# Patient Record
Sex: Female | Born: 1955
Health system: Southern US, Community
[De-identification: ages and names within clinical notes are randomized; demographics above are authoritative.]

## PROBLEM LIST (undated history)

## (undated) DIAGNOSIS — E785 Hyperlipidemia, unspecified: Secondary | ICD-10-CM

## (undated) DIAGNOSIS — F32A Depression, unspecified: Secondary | ICD-10-CM

## (undated) DIAGNOSIS — I10 Essential (primary) hypertension: Secondary | ICD-10-CM

## (undated) DIAGNOSIS — N189 Chronic kidney disease, unspecified: Secondary | ICD-10-CM

## (undated) DIAGNOSIS — F329 Major depressive disorder, single episode, unspecified: Secondary | ICD-10-CM

## (undated) DIAGNOSIS — R634 Abnormal weight loss: Secondary | ICD-10-CM

## (undated) DIAGNOSIS — M961 Postlaminectomy syndrome, not elsewhere classified: Secondary | ICD-10-CM

## (undated) DIAGNOSIS — M810 Age-related osteoporosis without current pathological fracture: Secondary | ICD-10-CM

## (undated) HISTORY — DX: Postlaminectomy syndrome, not elsewhere classified: M96.1

## (undated) HISTORY — PX: SPINE SURGERY: SHX786

## (undated) HISTORY — DX: Depression, unspecified: F32.A

## (undated) HISTORY — DX: Age-related osteoporosis without current pathological fracture: M81.0

## (undated) HISTORY — DX: Essential (primary) hypertension: I10

## (undated) HISTORY — DX: Major depressive disorder, single episode, unspecified: F32.9

## (undated) HISTORY — DX: Hyperlipidemia, unspecified: E78.5

## (undated) HISTORY — PX: ABDOMINAL HYSTERECTOMY: SHX81

## (undated) HISTORY — DX: Abnormal weight loss: R63.4

## (undated) HISTORY — PX: MOUTH SURGERY: SHX715

## (undated) HISTORY — DX: Chronic kidney disease, unspecified: N18.9

---

## 2004-03-17 ENCOUNTER — Ambulatory Visit: Payer: Self-pay | Admitting: Physician Assistant

## 2004-04-19 ENCOUNTER — Ambulatory Visit: Payer: Self-pay | Admitting: Physician Assistant

## 2004-05-18 ENCOUNTER — Ambulatory Visit: Payer: Self-pay | Admitting: Physician Assistant

## 2004-06-15 ENCOUNTER — Ambulatory Visit: Payer: Self-pay | Admitting: Physician Assistant

## 2004-07-16 ENCOUNTER — Ambulatory Visit: Payer: Self-pay | Admitting: Physician Assistant

## 2004-08-16 ENCOUNTER — Ambulatory Visit: Payer: Self-pay | Admitting: Physician Assistant

## 2004-09-14 ENCOUNTER — Ambulatory Visit: Payer: Self-pay | Admitting: Physician Assistant

## 2004-09-28 ENCOUNTER — Ambulatory Visit: Payer: Self-pay | Admitting: Physician Assistant

## 2004-11-08 ENCOUNTER — Ambulatory Visit: Payer: Self-pay | Admitting: Physician Assistant

## 2004-11-18 ENCOUNTER — Ambulatory Visit: Payer: Self-pay | Admitting: Pain Medicine

## 2004-12-01 ENCOUNTER — Ambulatory Visit: Payer: Self-pay | Admitting: Physician Assistant

## 2004-12-02 ENCOUNTER — Ambulatory Visit: Payer: Self-pay | Admitting: Pain Medicine

## 2004-12-09 ENCOUNTER — Ambulatory Visit: Payer: Self-pay | Admitting: Pain Medicine

## 2005-01-12 ENCOUNTER — Ambulatory Visit: Payer: Self-pay | Admitting: Physician Assistant

## 2005-02-14 ENCOUNTER — Ambulatory Visit: Payer: Self-pay | Admitting: Physician Assistant

## 2005-02-24 ENCOUNTER — Ambulatory Visit: Payer: Self-pay | Admitting: Pain Medicine

## 2005-03-14 ENCOUNTER — Ambulatory Visit: Payer: Self-pay | Admitting: Physician Assistant

## 2005-04-11 ENCOUNTER — Ambulatory Visit: Payer: Self-pay | Admitting: Physician Assistant

## 2005-05-12 ENCOUNTER — Ambulatory Visit: Payer: Self-pay | Admitting: Physician Assistant

## 2005-06-07 ENCOUNTER — Ambulatory Visit: Payer: Self-pay | Admitting: Physician Assistant

## 2005-06-27 ENCOUNTER — Ambulatory Visit: Payer: Self-pay | Admitting: Physician Assistant

## 2005-08-11 ENCOUNTER — Ambulatory Visit: Payer: Self-pay | Admitting: Physician Assistant

## 2005-09-06 ENCOUNTER — Ambulatory Visit: Payer: Self-pay | Admitting: Pain Medicine

## 2005-10-06 ENCOUNTER — Ambulatory Visit: Payer: Self-pay | Admitting: Physician Assistant

## 2005-11-10 ENCOUNTER — Ambulatory Visit: Payer: Self-pay | Admitting: Physician Assistant

## 2005-12-08 ENCOUNTER — Ambulatory Visit: Payer: Self-pay | Admitting: Physician Assistant

## 2005-12-13 ENCOUNTER — Ambulatory Visit: Payer: Self-pay | Admitting: Pain Medicine

## 2006-01-05 ENCOUNTER — Ambulatory Visit: Payer: Self-pay | Admitting: Physician Assistant

## 2006-02-06 ENCOUNTER — Ambulatory Visit: Payer: Self-pay | Admitting: Physician Assistant

## 2006-02-09 ENCOUNTER — Ambulatory Visit: Payer: Self-pay | Admitting: Pain Medicine

## 2006-03-07 ENCOUNTER — Ambulatory Visit: Payer: Self-pay | Admitting: Physician Assistant

## 2006-04-06 ENCOUNTER — Ambulatory Visit: Payer: Self-pay | Admitting: Physician Assistant

## 2006-05-08 ENCOUNTER — Ambulatory Visit: Payer: Self-pay | Admitting: Physician Assistant

## 2006-06-07 ENCOUNTER — Ambulatory Visit: Payer: Self-pay | Admitting: Physician Assistant

## 2006-07-06 ENCOUNTER — Ambulatory Visit: Payer: Self-pay | Admitting: Physician Assistant

## 2006-08-01 ENCOUNTER — Ambulatory Visit: Payer: Self-pay | Admitting: Physician Assistant

## 2006-09-13 ENCOUNTER — Ambulatory Visit: Payer: Self-pay | Admitting: Physician Assistant

## 2006-10-13 ENCOUNTER — Ambulatory Visit: Payer: Self-pay | Admitting: Physician Assistant

## 2006-11-13 ENCOUNTER — Ambulatory Visit: Payer: Self-pay | Admitting: Physician Assistant

## 2006-12-13 ENCOUNTER — Ambulatory Visit: Payer: Self-pay | Admitting: Physician Assistant

## 2007-01-11 ENCOUNTER — Ambulatory Visit: Payer: Self-pay | Admitting: Physician Assistant

## 2007-02-12 ENCOUNTER — Ambulatory Visit: Payer: Self-pay | Admitting: Physician Assistant

## 2007-03-14 ENCOUNTER — Ambulatory Visit: Payer: Self-pay | Admitting: Physician Assistant

## 2007-04-12 ENCOUNTER — Ambulatory Visit: Payer: Self-pay | Admitting: Physician Assistant

## 2007-05-10 ENCOUNTER — Ambulatory Visit: Payer: Self-pay | Admitting: Pain Medicine

## 2007-06-11 ENCOUNTER — Ambulatory Visit: Payer: Self-pay | Admitting: Physician Assistant

## 2007-07-11 ENCOUNTER — Ambulatory Visit: Payer: Self-pay | Admitting: Physician Assistant

## 2007-08-13 ENCOUNTER — Ambulatory Visit: Payer: Self-pay | Admitting: Physician Assistant

## 2007-09-12 ENCOUNTER — Ambulatory Visit: Payer: Self-pay | Admitting: Physician Assistant

## 2007-10-16 ENCOUNTER — Ambulatory Visit: Payer: Self-pay | Admitting: Physician Assistant

## 2007-11-14 ENCOUNTER — Ambulatory Visit: Payer: Self-pay | Admitting: Pain Medicine

## 2007-12-13 ENCOUNTER — Ambulatory Visit: Payer: Self-pay | Admitting: Physician Assistant

## 2008-01-01 ENCOUNTER — Ambulatory Visit: Payer: Self-pay | Admitting: Pain Medicine

## 2008-01-15 ENCOUNTER — Ambulatory Visit: Payer: Self-pay | Admitting: Physician Assistant

## 2008-02-11 ENCOUNTER — Ambulatory Visit: Payer: Self-pay | Admitting: Pain Medicine

## 2008-03-13 ENCOUNTER — Ambulatory Visit: Payer: Self-pay | Admitting: Physician Assistant

## 2008-04-09 ENCOUNTER — Ambulatory Visit: Payer: Self-pay | Admitting: Physician Assistant

## 2008-06-10 ENCOUNTER — Ambulatory Visit: Payer: Self-pay | Admitting: Physician Assistant

## 2008-09-10 ENCOUNTER — Ambulatory Visit: Payer: Self-pay | Admitting: Physician Assistant

## 2008-12-08 ENCOUNTER — Ambulatory Visit: Payer: Self-pay | Admitting: Physician Assistant

## 2009-03-12 ENCOUNTER — Ambulatory Visit: Payer: Self-pay | Admitting: Physician Assistant

## 2009-06-10 ENCOUNTER — Ambulatory Visit: Payer: Self-pay | Admitting: Physician Assistant

## 2009-09-07 ENCOUNTER — Ambulatory Visit: Payer: Self-pay | Admitting: Pain Medicine

## 2013-04-10 ENCOUNTER — Ambulatory Visit: Payer: Self-pay

## 2014-11-17 ENCOUNTER — Other Ambulatory Visit: Payer: Self-pay | Admitting: Family Medicine

## 2014-11-17 DIAGNOSIS — M961 Postlaminectomy syndrome, not elsewhere classified: Secondary | ICD-10-CM | POA: Insufficient documentation

## 2014-11-17 MED ORDER — OXYCODONE HCL 20 MG PO TABS
1.0000 | ORAL_TABLET | Freq: Four times a day (QID) | ORAL | Status: DC
Start: 2014-11-17 — End: 2014-12-16

## 2014-11-18 DIAGNOSIS — F329 Major depressive disorder, single episode, unspecified: Secondary | ICD-10-CM

## 2014-11-18 DIAGNOSIS — E785 Hyperlipidemia, unspecified: Secondary | ICD-10-CM | POA: Insufficient documentation

## 2014-11-18 DIAGNOSIS — I10 Essential (primary) hypertension: Secondary | ICD-10-CM | POA: Insufficient documentation

## 2014-11-18 DIAGNOSIS — F339 Major depressive disorder, recurrent, unspecified: Secondary | ICD-10-CM | POA: Insufficient documentation

## 2014-11-19 ENCOUNTER — Ambulatory Visit (INDEPENDENT_AMBULATORY_CARE_PROVIDER_SITE_OTHER): Payer: Medicaid Other | Admitting: Family Medicine

## 2014-11-19 ENCOUNTER — Encounter: Payer: Self-pay | Admitting: Family Medicine

## 2014-11-19 VITALS — BP 100/70 | HR 112 | Temp 98.2°F | Ht 66.5 in | Wt 143.0 lb

## 2014-11-19 DIAGNOSIS — M961 Postlaminectomy syndrome, not elsewhere classified: Secondary | ICD-10-CM | POA: Diagnosis not present

## 2014-11-19 DIAGNOSIS — F329 Major depressive disorder, single episode, unspecified: Secondary | ICD-10-CM | POA: Diagnosis not present

## 2014-11-19 DIAGNOSIS — I10 Essential (primary) hypertension: Secondary | ICD-10-CM | POA: Diagnosis not present

## 2014-11-19 DIAGNOSIS — F32A Depression, unspecified: Secondary | ICD-10-CM

## 2014-11-19 DIAGNOSIS — K219 Gastro-esophageal reflux disease without esophagitis: Secondary | ICD-10-CM | POA: Diagnosis not present

## 2014-11-19 DIAGNOSIS — E785 Hyperlipidemia, unspecified: Secondary | ICD-10-CM | POA: Diagnosis not present

## 2014-11-19 MED ORDER — CYCLOBENZAPRINE HCL 10 MG PO TABS: 10.0000 mg | ORAL_TABLET | Freq: Three times a day (TID) | ORAL | Status: DC | PRN

## 2014-11-19 MED ORDER — ALPRAZOLAM 2 MG PO TABS: 2.0000 mg | ORAL_TABLET | Freq: Four times a day (QID) | ORAL | Status: DC

## 2014-11-19 NOTE — Addendum Note (Signed)
Addended byGolden Pop on: 11/19/2014 02:28 PM   Modules accepted: Orders

## 2014-11-19 NOTE — Assessment & Plan Note (Signed)
The current medical regimen is effective;  continue present plan and medications.  

## 2014-11-19 NOTE — Assessment & Plan Note (Addendum)
The current medical regimen is effective;  continue present plan and medications. Taking xanax 2mg  4 times a day has done so long term and stable. Will cont xanax dosing.

## 2014-11-19 NOTE — Assessment & Plan Note (Signed)
pts failed back is stable Pain control stable discussed referral to Dr Primus Bravo for rx of narcotics due to current medical climate.

## 2014-11-19 NOTE — Assessment & Plan Note (Signed)
Discuss use of meds prop up bed and use of prilosec

## 2014-11-19 NOTE — Progress Notes (Signed)
BP 100/70 mmHg  Pulse 112  Temp(Src) 98.2 F (36.8 C)  Ht 5' 6.5" (1.689 m)  Wt 143 lb (64.864 kg)  BMI 22.74 kg/m2  SpO2 99%   Subjective:    Patient ID: Caitlyn May, female    DOB: 02-05-1956, 59 y.o.   MRN: [id number]  HPI: Caitlyn May is a 59 y.o. female  Chief Complaint  Patient presents with  . Medication Refill  pts failed back is stable Pain control stable discussed referral to Dr Primus Bravo for rx of narcotics due to current medical climate.  BP and lipids stable on meds  Takes every day long term  Reflux is worse zantac ot doing the job  Relevant past medical, surgical, family and social history reviewed and updated as indicated. Interim medical history since our last visit reviewed. Allergies and medications reviewed and updated.  Review of Systems  Constitutional: Negative.   Respiratory: Negative.   Cardiovascular: Negative.     Per HPI unless specifically indicated above     Objective:    BP 100/70 mmHg  Pulse 112  Temp(Src) 98.2 F (36.8 C)  Ht 5' 6.5" (1.689 m)  Wt 143 lb (64.864 kg)  BMI 22.74 kg/m2  SpO2 99%  Wt Readings from Last 3 Encounters:  11/19/14 143 lb (64.864 kg)  08/27/14 145 lb (65.772 kg)    Physical Exam  Constitutional: She is oriented to person, place, and time. She appears well-developed and well-nourished. No distress.  HENT:  Head: Normocephalic and atraumatic.  Right Ear: Hearing normal.  Left Ear: Hearing normal.  Nose: Nose normal.  Eyes: Conjunctivae and lids are normal. Right eye exhibits no discharge. Left eye exhibits no discharge. No scleral icterus.  Cardiovascular: Normal rate, regular rhythm and normal heart sounds.   Pulmonary/Chest: Effort normal and breath sounds normal. No respiratory distress.  Musculoskeletal: Normal range of motion.  Neurological: She is alert and oriented to person, place, and time.  Skin: Skin is intact. No rash noted.  Psychiatric: She has a normal mood and  affect. Her speech is normal and behavior is normal. Judgment and thought content normal. Cognition and memory are normal.    No results found for this or any previous visit.    Assessment & Plan:   Problem List Items Addressed This Visit      Cardiovascular and Mediastinum   Hypertension - Primary    The current medical regimen is effective;  continue present plan and medications.         Digestive   Acid reflux    Discuss use of meds prop up bed and use of prilosec      Relevant Medications   ranitidine (ZANTAC) 75 MG tablet     Other   Postlaminectomy syndrome, lumbar region    pts failed back is stable Pain control stable discussed referral to Dr Primus Bravo for rx of narcotics due to current medical climate.      Relevant Medications   cyclobenzaprine (FLEXERIL) 10 MG tablet   Depression    The current medical regimen is effective;  continue present plan and medications. Taking xanax 2mg  4 times a day has done so long term and stable. Will cont xanax dosing.       Relevant Medications   alprazolam (XANAX) 2 MG tablet   Hyperlipidemia    The current medical regimen is effective;  continue present plan and medications.           Follow up plan: Return in  about 3 months (around 02/19/2015) for Physical Exam.

## 2014-11-24 ENCOUNTER — Ambulatory Visit (INDEPENDENT_AMBULATORY_CARE_PROVIDER_SITE_OTHER): Payer: Medicaid Other | Admitting: Family Medicine

## 2014-11-24 ENCOUNTER — Encounter: Payer: Self-pay | Admitting: Family Medicine

## 2014-11-24 VITALS — BP 142/102 | HR 104 | Temp 98.2°F | Ht 65.5 in | Wt 139.2 lb

## 2014-11-24 DIAGNOSIS — R1011 Right upper quadrant pain: Secondary | ICD-10-CM | POA: Insufficient documentation

## 2014-11-24 DIAGNOSIS — K625 Hemorrhage of anus and rectum: Secondary | ICD-10-CM | POA: Diagnosis not present

## 2014-11-24 DIAGNOSIS — I10 Essential (primary) hypertension: Secondary | ICD-10-CM

## 2014-11-24 LAB — CBC WITH DIFFERENTIAL/PLATELET
HEMOGLOBIN: 14.5 g/dL (ref 11.1–15.9)
Hematocrit: 42.4 % (ref 34.0–46.6)
Lymphocytes Absolute: 2.3 10*3/uL (ref 0.7–3.1)
Lymphs: 30 %
MCH: 31.1 pg (ref 26.6–33.0)
MCHC: 34.2 g/dL (ref 31.5–35.7)
MCV: 91 fL (ref 79–97)
MID (Absolute): 0.8 10*3/uL (ref 0.1–1.6)
MID: 11 %
NEUTROS ABS: 4.4 10*3/uL (ref 1.4–7.0)
Neutrophils: 59 %
Platelets: 231 10*3/uL (ref 150–379)
RBC: 4.66 x10E6/uL (ref 3.77–5.28)
RDW: 13 % (ref 12.3–15.4)
WBC: 7.5 10*3/uL (ref 3.4–10.8)

## 2014-11-24 LAB — IFOBT (OCCULT BLOOD): IMMUNOLOGICAL FECAL OCCULT BLOOD TEST: POSITIVE

## 2014-11-24 NOTE — Progress Notes (Signed)
BP 142/102 mmHg  Pulse 104  Temp(Src) 98.2 F (36.8 C) (Oral)  Ht 5' 5.5" (1.664 m)  Wt 139 lb 3.2 oz (63.141 kg)  BMI 22.80 kg/m2  SpO2 96%   Subjective:    Patient ID: Caitlyn May, female    DOB: [birth year], 59 y.o.   MRN: [id number]  HPI: Caitlyn May is a 59 y.o. female  Chief Complaint  Patient presents with  . Rectal Bleeding    Over the weekend   Patient notes that 10-15 years ago she got so constipated that she had to go to the hospital.   ABDOMINAL PAIN- in the middle of the night had severe pain for 50 minutes. Had severe pain in her epigastric region. Was so severe that she started writing her will. She not feels like her pain is in her RUQ. She was bleeding out of her rectum for several hours on Saturday. She would have bleeding with passing gas. She was having severe clots.  Duration: 3:days Onset: sudden Severity: severe  Quality: sharp, cramping and tearing Location:  RUQ and epigastric  Episode duration:  Radiation: no Frequency: constant for about 50 minutes Alleviating factors:  Aggravating factors: Status: better Treatments attempted: none Fever: yes Nausea: no Vomiting: no Weight loss: no Decreased appetite: no Diarrhea: no Constipation: yes Blood in stool: yes Heartburn: no Jaundice: no Rash: no Dysuria/urinary frequency: no Hematuria: no History of sexually transmitted disease: no Recurrent NSAID use: no   Didn't take her BP medicine today. She does not want to have a CT done today. She states that she is too tired to go.   Relevant past medical, surgical, family and social history reviewed and updated as indicated. Interim medical history since our last visit reviewed. Allergies and medications reviewed and updated.  Review of Systems  Constitutional: Positive for chills, diaphoresis and fatigue. Negative for fever, activity change, appetite change and unexpected weight change.  Respiratory: Negative.    Cardiovascular: Negative.   Gastrointestinal: Positive for abdominal pain, constipation, blood in stool, anal bleeding and rectal pain. Negative for nausea, vomiting, diarrhea and abdominal distention.  Genitourinary: Negative.   Psychiatric/Behavioral: Negative.     Per HPI unless specifically indicated above     Objective:    BP 142/102 mmHg  Pulse 104  Temp(Src) 98.2 F (36.8 C) (Oral)  Ht 5' 5.5" (1.664 m)  Wt 139 lb 3.2 oz (63.141 kg)  BMI 22.80 kg/m2  SpO2 96%  Wt Readings from Last 3 Encounters:  11/24/14 139 lb 3.2 oz (63.141 kg)  11/19/14 143 lb (64.864 kg)  08/27/14 145 lb (65.772 kg)    Physical Exam  Constitutional: She is oriented to person, place, and time. She appears well-developed and well-nourished. No distress.  HENT:  Head: Normocephalic and atraumatic.  Right Ear: Hearing normal.  Left Ear: Hearing normal.  Nose: Nose normal.  Eyes: Conjunctivae and lids are normal. Right eye exhibits no discharge. Left eye exhibits no discharge. No scleral icterus.  Cardiovascular: Normal rate, regular rhythm and normal heart sounds.  Exam reveals no gallop and no friction rub.   No murmur heard. Pulmonary/Chest: Effort normal and breath sounds normal. No respiratory distress. She has no wheezes. She has no rales. She exhibits no tenderness.  Abdominal: Soft. Bowel sounds are normal. She exhibits no distension and no mass. There is no hepatosplenomegaly, splenomegaly or hepatomegaly. There is no tenderness. There is no rigidity, no rebound, no guarding, no CVA tenderness, no tenderness at McBurney's point and  negative Murphy's sign.  Genitourinary:  + small non-bleeding external hemorrhoid, palpable internal hemorrhoid at 6:00, slight erythema, no visible blood, + FOBT  Musculoskeletal: Normal range of motion.  Neurological: She is alert and oriented to person, place, and time.  Skin: Skin is warm, dry and intact. No rash noted. No erythema. No pallor.  Psychiatric:  She has a normal mood and affect. Her speech is normal and behavior is normal. Judgment and thought content normal. Cognition and memory are normal.    Results for orders placed or performed in visit on 11/24/14  CBC With Differential/Platelet  Result Value Ref Range   WBC 7.5 3.4 - 10.8 x10E3/uL   RBC 4.66 3.77 - 5.28 x10E6/uL   Hemoglobin 14.5 11.1 - 15.9 g/dL   Hematocrit 42.4 34.0 - 46.6 %   MCV 91 79 - 97 fL   MCH 31.1 26.6 - 33.0 pg   MCHC 34.2 31.5 - 35.7 g/dL   RDW 13.0 12.3 - 15.4 %   Platelets 231 150 - 379 x10E3/uL   NEUTROPHILS 59 %   Lymphs 30 %   MID 11 %   Neutrophils Absolute 4.4 1.4 - 7.0 x10E3/uL   Lymphocytes Absolute 2.3 0.7 - 3.1 x10E3/uL   MID (Absolute) 0.8 0.1 - 1.6 X10E3/uL  IFOBT POC (occult bld, rslt in office)  Result Value Ref Range   IFOBT Positive       Assessment & Plan:   Problem List Items Addressed This Visit      Cardiovascular and Mediastinum   Hypertension    Did not take her medication today. Will restart medication and recheck next visit.         Digestive   Rectal bleeding - Primary    + FOBT today. Benign abdominal exam today. Hgb normal today. Will check CMP as well as amylase and lipase. Will check CT abdomen and pelvis- ordered today. No fever, so will not presuptively treat for diverticulitis. Await CT. Call if not getting better or getting worse. If nothing on CT, will arrange for GI referral for colonoscopy.       Relevant Orders   CT Abdomen Pelvis W Contrast   CBC With Differential/Platelet (Completed)   Comprehensive metabolic panel   IFOBT POC (occult bld, rslt in office) (Completed)     Other   Right upper quadrant pain    Benign exam today. Will check labs and obtain CT of abdomen and pelvis. Bland diet at this time. Monitor closely.       Relevant Orders   CT Abdomen Pelvis W Contrast   CBC With Differential/Platelet (Completed)   Comprehensive metabolic panel   Amylase   Lipase       Follow up  plan: Return in about 1 week (around 12/01/2014).

## 2014-11-24 NOTE — Assessment & Plan Note (Signed)
Benign exam today. Will check labs and obtain CT of abdomen and pelvis. Bland diet at this time. Monitor closely.

## 2014-11-24 NOTE — Assessment & Plan Note (Signed)
+   FOBT today. Benign abdominal exam today. Hgb normal today. Will check CMP as well as amylase and lipase. Will check CT abdomen and pelvis- ordered today. No fever, so will not presuptively treat for diverticulitis. Await CT. Call if not getting better or getting worse. If nothing on CT, will arrange for GI referral for colonoscopy.

## 2014-11-24 NOTE — Patient Instructions (Addendum)
CT scan at [facility] 8:45AM 11/28/14- Friday.  Pick up the prep at the [place] [facility] Thursday.  Nothing to eat or drink after midnight on Thursday except the prep.   Food Choices to Help Relieve Diarrhea When you have diarrhea, the foods you eat and your eating habits are very important. Choosing the right foods and drinks can help relieve diarrhea. Also, because diarrhea can last up to 7 days, you need to replace lost fluids and electrolytes (such as sodium, potassium, and chloride) in order to help prevent dehydration.  WHAT GENERAL GUIDELINES DO I NEED TO FOLLOW?  Slowly drink 1 cup (8 oz) of fluid for each episode of diarrhea. If you are getting enough fluid, your urine will be clear or pale yellow.  Eat starchy foods. Some good choices include white rice, white toast, pasta, low-fiber cereal, baked potatoes (without the skin), saltine crackers, and bagels.  Avoid large servings of any cooked vegetables.  Limit fruit to two servings per day. A serving is  cup or 1 small piece.  Choose foods with less than 2 g of fiber per serving.  Limit fats to less than 8 tsp (38 g) per day.  Avoid fried foods.  Eat foods that have probiotics in them. Probiotics can be found in certain dairy products.  Avoid foods and beverages that may increase the speed at which food moves through the stomach and intestines (gastrointestinal tract). Things to avoid include:  High-fiber foods, such as dried fruit, raw fruits and vegetables, nuts, seeds, and whole grain foods.  Spicy foods and high-fat foods.  Foods and beverages sweetened with high-fructose corn syrup, honey, or sugar alcohols such as xylitol, sorbitol, and mannitol. WHAT FOODS ARE RECOMMENDED? Grains White rice. White, [place], or pita breads (fresh or toasted), including plain rolls, buns, or bagels. White pasta. Saltine, soda, or graham crackers. Pretzels. Low-fiber cereal. Cooked cereals made with water (such as cornmeal,  farina, or cream cereals). Plain muffins. Matzo. Melba toast. Zwieback.  Vegetables Potatoes (without the skin). Strained tomato and vegetable juices. Most well-cooked and canned vegetables without seeds. Tender lettuce. Fruits Cooked or canned applesauce, apricots, cherries, fruit cocktail, grapefruit, peaches, pears, or plums. Fresh bananas, apples without skin, cherries, grapes, cantaloupe, grapefruit, peaches, oranges, or plums.  Meat and Other Protein Products Baked or boiled chicken. Eggs. Tofu. Fish. Seafood. Smooth peanut butter. Ground or well-cooked tender beef, ham, veal, lamb, pork, or poultry.  Dairy Plain yogurt, kefir, and unsweetened liquid yogurt. Lactose-free milk, buttermilk, or soy milk. Plain hard cheese. Beverages Sport drinks. Clear broths. Diluted fruit juices (except prune). Regular, caffeine-free sodas such as ginger ale. Water. Decaffeinated teas. Oral rehydration solutions. Sugar-free beverages not sweetened with sugar alcohols. Other Bouillon, broth, or soups made from recommended foods.  The items listed above may not be a complete list of recommended foods or beverages. Contact your dietitian for more options. WHAT FOODS ARE NOT RECOMMENDED? Grains Whole grain, whole wheat, bran, or rye breads, rolls, pastas, crackers, and cereals. Wild or brown rice. Cereals that contain more than 2 g of fiber per serving. Corn tortillas or taco shells. Cooked or dry oatmeal. Granola. Popcorn. Vegetables Raw vegetables. Cabbage, broccoli, Brussels sprouts, artichokes, baked beans, beet greens, corn, kale, legumes, peas, sweet potatoes, and yams. Potato skins. Cooked spinach and cabbage. Fruits Dried fruit, including raisins and dates. Raw fruits. Stewed or dried prunes. Fresh apples with skin, apricots, mangoes, pears, raspberries, and strawberries.  Meat and Other Protein Products Chunky peanut butter. Nuts and  seeds. Beans and lentils. Berniece Salines.  Dairy High-fat cheeses. Milk,  chocolate milk, and beverages made with milk, such as milk shakes. Cream. Ice cream. Sweets and Desserts Sweet rolls, doughnuts, and sweet breads. Pancakes and waffles. Fats and Oils Butter. Cream sauces. Margarine. Salad oils. Plain salad dressings. Olives. Avocados.  Beverages Caffeinated beverages (such as coffee, tea, soda, or energy drinks). Alcoholic beverages. Fruit juices with pulp. Prune juice. Soft drinks sweetened with high-fructose corn syrup or sugar alcohols. Other Coconut. Hot sauce. Chili powder. Mayonnaise. Gravy. Cream-based or milk-based soups.  The items listed above may not be a complete list of foods and beverages to avoid. Contact your dietitian for more information. WHAT SHOULD I DO IF I BECOME DEHYDRATED? Diarrhea can sometimes lead to dehydration. Signs of dehydration include dark urine and dry mouth and skin. If you think you are dehydrated, you should rehydrate with an oral rehydration solution. These solutions can be purchased at pharmacies, retail stores, or online.  Drink -1 cup (120-240 mL) of oral rehydration solution each time you have an episode of diarrhea. If drinking this amount makes your diarrhea worse, try drinking smaller amounts more often. For example, drink 1-3 tsp (5-15 mL) every 5-10 minutes.  A general rule for staying hydrated is to drink 1-2 L of fluid per day. Talk to your health care provider about the specific amount you should be drinking each day. Drink enough fluids to keep your urine clear or pale yellow. Document Released: 08/06/2003 Document Revised: 05/21/2013 Document Reviewed: 04/08/2013 [facility] Patient Information 2015 [place], [place]. This information is not intended to replace advice given to you by your health care provider. Make sure you discuss any questions you have with your health care provider.

## 2014-11-24 NOTE — Assessment & Plan Note (Signed)
Did not take her medication today. Will restart medication and recheck next visit.

## 2014-11-25 ENCOUNTER — Telehealth: Payer: Self-pay | Admitting: Family Medicine

## 2014-11-25 LAB — COMPREHENSIVE METABOLIC PANEL
ALT: 8 IU/L (ref 0–32)
AST: 12 IU/L (ref 0–40)
Albumin/Globulin Ratio: 1.4 (ref 1.1–2.5)
Albumin: 4.4 g/dL (ref 3.6–4.8)
Alkaline Phosphatase: 119 IU/L — ABNORMAL HIGH (ref 39–117)
BUN/Creatinine Ratio: 14 (ref 11–26)
BUN: 13 mg/dL (ref 8–27)
Bilirubin Total: 0.2 mg/dL (ref 0.0–1.2)
CALCIUM: 9.2 mg/dL (ref 8.7–10.3)
CO2: 23 mmol/L (ref 18–29)
Chloride: 99 mmol/L (ref 97–108)
Creatinine, Ser: 0.94 mg/dL (ref 0.57–1.00)
GFR calc non Af Amer: 66 mL/min/{1.73_m2} (ref >59)
GFR, EST AFRICAN AMERICAN: 76 mL/min/{1.73_m2} (ref >59)
Globulin, Total: 3.1 g/dL (ref 1.5–4.5)
Glucose: 106 mg/dL — ABNORMAL HIGH (ref 65–99)
POTASSIUM: 4.3 mmol/L (ref 3.5–5.2)
Sodium: 139 mmol/L (ref 134–144)
TOTAL PROTEIN: 7.5 g/dL (ref 6.0–8.5)

## 2014-11-25 LAB — LIPASE: Lipase: 34 U/L (ref 0–59)

## 2014-11-25 LAB — AMYLASE: Amylase: 86 U/L (ref 31–124)

## 2014-11-25 NOTE — Telephone Encounter (Signed)
Called patient to let her know that her labs came back normal. Await CT scan.

## 2014-11-28 ENCOUNTER — Telehealth: Payer: Self-pay | Admitting: Family Medicine

## 2014-11-28 ENCOUNTER — Ambulatory Visit
Admission: RE | Admit: 2014-11-28 | Discharge: 2014-11-28 | Disposition: A | Discharge status: Home / Self Care | Payer: Medicare Other | Source: Ambulatory Visit | Attending: Family Medicine | Admitting: Family Medicine

## 2014-11-28 DIAGNOSIS — I700 Atherosclerosis of aorta: Secondary | ICD-10-CM | POA: Insufficient documentation

## 2014-11-28 DIAGNOSIS — K7689 Other specified diseases of liver: Secondary | ICD-10-CM | POA: Insufficient documentation

## 2014-11-28 DIAGNOSIS — K5641 Fecal impaction: Secondary | ICD-10-CM | POA: Insufficient documentation

## 2014-11-28 DIAGNOSIS — Z880 Allergy status to penicillin: Secondary | ICD-10-CM | POA: Diagnosis not present

## 2014-11-28 DIAGNOSIS — I10 Essential (primary) hypertension: Secondary | ICD-10-CM | POA: Insufficient documentation

## 2014-11-28 DIAGNOSIS — F329 Major depressive disorder, single episode, unspecified: Secondary | ICD-10-CM | POA: Diagnosis not present

## 2014-11-28 DIAGNOSIS — N281 Cyst of kidney, acquired: Secondary | ICD-10-CM | POA: Insufficient documentation

## 2014-11-28 DIAGNOSIS — K219 Gastro-esophageal reflux disease without esophagitis: Secondary | ICD-10-CM | POA: Insufficient documentation

## 2014-11-28 DIAGNOSIS — N189 Chronic kidney disease, unspecified: Secondary | ICD-10-CM | POA: Diagnosis not present

## 2014-11-28 DIAGNOSIS — K625 Hemorrhage of anus and rectum: Secondary | ICD-10-CM | POA: Diagnosis not present

## 2014-11-28 DIAGNOSIS — Z888 Allergy status to other drugs, medicaments and biological substances status: Secondary | ICD-10-CM | POA: Diagnosis not present

## 2014-11-28 DIAGNOSIS — K760 Fatty (change of) liver, not elsewhere classified: Secondary | ICD-10-CM | POA: Insufficient documentation

## 2014-11-28 DIAGNOSIS — R1011 Right upper quadrant pain: Secondary | ICD-10-CM

## 2014-11-28 DIAGNOSIS — Z882 Allergy status to sulfonamides status: Secondary | ICD-10-CM | POA: Diagnosis not present

## 2014-11-28 DIAGNOSIS — E785 Hyperlipidemia, unspecified: Secondary | ICD-10-CM | POA: Diagnosis not present

## 2014-11-28 DIAGNOSIS — K5900 Constipation, unspecified: Secondary | ICD-10-CM | POA: Diagnosis not present

## 2014-11-28 MED ORDER — IOHEXOL 300 MG/ML  SOLN
100.0000 mL | Freq: Once | INTRAMUSCULAR | Status: AC | PRN
  Administered 2014-11-28: 100 mL via INTRAVENOUS

## 2014-11-28 MED ORDER — POLYETHYLENE GLYCOL 3350 17 GM/SCOOP PO POWD: 17.0000 g | Freq: Two times a day (BID) | ORAL | Status: DC | PRN

## 2014-11-28 NOTE — Telephone Encounter (Signed)
Called patient with the results of her CT scan. Abundent stool in the Ascending and proximal colon and cecum. Will start her on miralax BID and follow up with her as scheduled. No sign of any infection. Likely due to impaction.

## 2014-12-16 ENCOUNTER — Other Ambulatory Visit: Payer: Self-pay | Admitting: Family Medicine

## 2014-12-16 MED ORDER — OXYCODONE HCL 20 MG PO TABS: 1.0000 | ORAL_TABLET | Freq: Four times a day (QID) | ORAL | Status: DC

## 2015-01-12 ENCOUNTER — Other Ambulatory Visit: Payer: Self-pay | Admitting: Family Medicine

## 2015-01-12 MED ORDER — OXYCODONE HCL 20 MG PO TABS: 1.0000 | ORAL_TABLET | Freq: Four times a day (QID) | ORAL | Status: DC

## 2015-01-26 ENCOUNTER — Ambulatory Visit (INDEPENDENT_AMBULATORY_CARE_PROVIDER_SITE_OTHER): Payer: Medicaid Other | Admitting: Family Medicine

## 2015-01-26 ENCOUNTER — Encounter: Payer: Self-pay | Admitting: Family Medicine

## 2015-01-26 VITALS — BP 111/74 | HR 74 | Temp 97.8°F | Ht 66.0 in | Wt 134.0 lb

## 2015-01-26 DIAGNOSIS — K5641 Fecal impaction: Secondary | ICD-10-CM | POA: Diagnosis not present

## 2015-01-26 DIAGNOSIS — F32A Depression, unspecified: Secondary | ICD-10-CM

## 2015-01-26 DIAGNOSIS — F329 Major depressive disorder, single episode, unspecified: Secondary | ICD-10-CM

## 2015-01-26 DIAGNOSIS — M961 Postlaminectomy syndrome, not elsewhere classified: Secondary | ICD-10-CM | POA: Diagnosis not present

## 2015-01-26 DIAGNOSIS — I10 Essential (primary) hypertension: Secondary | ICD-10-CM

## 2015-01-26 MED ORDER — LUBIPROSTONE 24 MCG PO CAPS: 24.0000 ug | ORAL_CAPSULE | Freq: Two times a day (BID) | ORAL | Status: DC

## 2015-01-26 NOTE — Assessment & Plan Note (Signed)
As with continued chronic pain stable on current pain regimen Needs referral to pain clinic to manage patient's narcotics as we will not be managing narcotics due to current climate for use of pain medications.

## 2015-01-26 NOTE — Assessment & Plan Note (Signed)
The current medical regimen is effective;  continue present plan and medications.  

## 2015-01-26 NOTE — Assessment & Plan Note (Signed)
Depression so much better with resolution of the estate taxes. Patient currently depression stable we'll not change medications and not recommending seen psychiatrist at this point.

## 2015-01-26 NOTE — Progress Notes (Signed)
BP 111/74 mmHg  Pulse 74  Temp(Src) 97.8 F (36.6 C)  Ht 5\' 6"  (1.676 m)  Wt 134 lb (60.782 kg)  BMI 21.64 kg/m2  SpO2 97%   Subjective:    Patient ID: Caitlyn May, female    DOB: 1955/09/06, 59 y.o.   MRN: [id number]  HPI: Caitlyn May is a 59 y.o. female  Chief Complaint  Patient presents with  . Constipation   patient for chronic constipation and severe abdominal pain better when she takes MiraLAX. But doesn't want to continue taking MiraLAX, wondering if there something better or easier to do.  Pain was resolved when not constipated.  constipation probably result of pain medication.  Chronic pain stable did not get pain clinic appointment due to wrong telephone number on file patient will correct.  Chronic pain stable on current medication regimen.  Relevant past medical, surgical, family and social history reviewed and updated as indicated. Interim medical history since our last visit reviewed. Allergies and medications reviewed and updated.  Review of Systems  Constitutional: Negative.   Respiratory: Negative.   Cardiovascular: Negative.     Per HPI unless specifically indicated above     Objective:    BP 111/74 mmHg  Pulse 74  Temp(Src) 97.8 F (36.6 C)  Ht 5\' 6"  (1.676 m)  Wt 134 lb (60.782 kg)  BMI 21.64 kg/m2  SpO2 97%  Wt Readings from Last 3 Encounters:  01/26/15 134 lb (60.782 kg)  11/24/14 139 lb 3.2 oz (63.141 kg)  11/19/14 143 lb (64.864 kg)    Physical Exam  Constitutional: She is oriented to person, place, and time. She appears well-developed and well-nourished. No distress.  HENT:  Head: Normocephalic and atraumatic.  Right Ear: Hearing normal.  Left Ear: Hearing normal.  Nose: Nose normal.  Eyes: Conjunctivae and lids are normal. Right eye exhibits no discharge. Left eye exhibits no discharge. No scleral icterus.  Cardiovascular: Normal rate, regular rhythm and normal heart sounds.   Pulmonary/Chest: Effort normal  and breath sounds normal. No respiratory distress.  Musculoskeletal: Normal range of motion.  Neurological: She is alert and oriented to person, place, and time.  Skin: Skin is intact. No rash noted.  Psychiatric: She has a normal mood and affect. Her speech is normal and behavior is normal. Judgment and thought content normal. Cognition and memory are normal.    Results for orders placed or performed in visit on 11/24/14  CBC With Differential/Platelet  Result Value Ref Range   WBC 7.5 3.4 - 10.8 x10E3/uL   RBC 4.66 3.77 - 5.28 x10E6/uL   Hemoglobin 14.5 11.1 - 15.9 g/dL   Hematocrit 42.4 34.0 - 46.6 %   MCV 91 79 - 97 fL   MCH 31.1 26.6 - 33.0 pg   MCHC 34.2 31.5 - 35.7 g/dL   RDW 13.0 12.3 - 15.4 %   Platelets 231 150 - 379 x10E3/uL   Neutrophils 59 %   Lymphs 30 %   MID 11 %   Neutrophils Absolute 4.4 1.4 - 7.0 x10E3/uL   Lymphocytes Absolute 2.3 0.7 - 3.1 x10E3/uL   MID (Absolute) 0.8 0.1 - 1.6 X10E3/uL  Comprehensive metabolic panel  Result Value Ref Range   Glucose 106 (H) 65 - 99 mg/dL   BUN 13 8 - 27 mg/dL   Creatinine, Ser 0.94 0.57 - 1.00 mg/dL   GFR calc non Af Amer 66 >59 mL/min/1.73   GFR calc Af Amer 76 >59 mL/min/1.73   BUN/Creatinine Ratio  14 11 - 26   Sodium 139 134 - 144 mmol/L   Potassium 4.3 3.5 - 5.2 mmol/L   Chloride 99 97 - 108 mmol/L   CO2 23 18 - 29 mmol/L   Calcium 9.2 8.7 - 10.3 mg/dL   Total Protein 7.5 6.0 - 8.5 g/dL   Albumin 4.4 3.6 - 4.8 g/dL   Globulin, Total 3.1 1.5 - 4.5 g/dL   Albumin/Globulin Ratio 1.4 1.1 - 2.5   Bilirubin Total 0.2 0.0 - 1.2 mg/dL   Alkaline Phosphatase 119 (H) 39 - 117 IU/L   AST 12 0 - 40 IU/L   ALT 8 0 - 32 IU/L  Amylase  Result Value Ref Range   Amylase 86 31 - 124 U/L  Lipase  Result Value Ref Range   Lipase 34 0 - 59 U/L  IFOBT POC (occult bld, rslt in office)  Result Value Ref Range   IFOBT Positive       Assessment & Plan:   Problem List Items Addressed This Visit      Cardiovascular and  Mediastinum   Hypertension - Primary    The current medical regimen is effective;  continue present plan and medications.         Digestive   Fecal impaction of colon    Discussed chronic constipation care and treatment will start medication may need to adjust for insurance companies      Relevant Medications   lubiprostone (AMITIZA) 24 MCG capsule     Other   Postlaminectomy syndrome, lumbar region    As with continued chronic pain stable on current pain regimen Needs referral to pain clinic to manage patient's narcotics as we will not be managing narcotics due to current climate for use of pain medications.      Relevant Orders   Ambulatory referral to Pain Clinic   Depression    Depression so much better with resolution of the estate taxes. Patient currently depression stable we'll not change medications and not recommending seen psychiatrist at this point.          Follow up plan: Return in about 3 months (around 04/28/2015) for Physical Exam.

## 2015-01-26 NOTE — Assessment & Plan Note (Signed)
Discussed chronic constipation care and treatment will start medication may need to adjust for insurance companies

## 2015-01-29 ENCOUNTER — Other Ambulatory Visit: Payer: Self-pay | Admitting: Family Medicine

## 2015-01-29 MED ORDER — OXYCODONE HCL 20 MG PO TABS: 1.0000 | ORAL_TABLET | Freq: Four times a day (QID) | ORAL | Status: DC

## 2015-02-18 ENCOUNTER — Telehealth: Payer: Self-pay

## 2015-02-18 MED ORDER — MOMETASONE FUROATE 50 MCG/ACT NA SUSP: 2.0000 | Freq: Every day | NASAL | Status: DC

## 2015-02-18 NOTE — Telephone Encounter (Signed)
[place] requesting Naxonex 64mcg/inh nose aer

## 2015-03-09 ENCOUNTER — Other Ambulatory Visit: Payer: Self-pay | Admitting: Family Medicine

## 2015-03-09 MED ORDER — OXYCODONE HCL 20 MG PO TABS: 1.0000 | ORAL_TABLET | Freq: Four times a day (QID) | ORAL | Status: DC

## 2015-03-16 ENCOUNTER — Encounter: Payer: Medicaid Other | Admitting: Family Medicine

## 2015-03-16 ENCOUNTER — Other Ambulatory Visit: Payer: Self-pay

## 2015-03-16 DIAGNOSIS — F32A Depression, unspecified: Secondary | ICD-10-CM

## 2015-03-16 DIAGNOSIS — F329 Major depressive disorder, single episode, unspecified: Secondary | ICD-10-CM

## 2015-03-16 NOTE — Telephone Encounter (Signed)
PATIENT: Caitlyn May DOB: [birth year] PHARMACY: [facility] LAST VISIT: 01/23/2015  Patient requests alprazolam 2mg  tab.

## 2015-03-17 NOTE — Telephone Encounter (Signed)
Call pt plz

## 2015-03-31 ENCOUNTER — Ambulatory Visit (INDEPENDENT_AMBULATORY_CARE_PROVIDER_SITE_OTHER): Payer: Medicare Other | Admitting: Family Medicine

## 2015-03-31 ENCOUNTER — Encounter: Payer: Self-pay | Admitting: Family Medicine

## 2015-03-31 VITALS — BP 117/78 | HR 73 | Temp 98.5°F | Ht 66.2 in | Wt 129.0 lb

## 2015-03-31 DIAGNOSIS — F329 Major depressive disorder, single episode, unspecified: Secondary | ICD-10-CM | POA: Diagnosis not present

## 2015-03-31 DIAGNOSIS — K5641 Fecal impaction: Secondary | ICD-10-CM | POA: Diagnosis not present

## 2015-03-31 DIAGNOSIS — M961 Postlaminectomy syndrome, not elsewhere classified: Secondary | ICD-10-CM

## 2015-03-31 DIAGNOSIS — Z0000 Encounter for general adult medical examination without abnormal findings: Secondary | ICD-10-CM

## 2015-03-31 DIAGNOSIS — Z23 Encounter for immunization: Secondary | ICD-10-CM

## 2015-03-31 DIAGNOSIS — Z1239 Encounter for other screening for malignant neoplasm of breast: Secondary | ICD-10-CM

## 2015-03-31 DIAGNOSIS — F32A Depression, unspecified: Secondary | ICD-10-CM

## 2015-03-31 DIAGNOSIS — I10 Essential (primary) hypertension: Secondary | ICD-10-CM

## 2015-03-31 DIAGNOSIS — Z1211 Encounter for screening for malignant neoplasm of colon: Secondary | ICD-10-CM

## 2015-03-31 LAB — URINALYSIS, ROUTINE W REFLEX MICROSCOPIC
Bilirubin, UA: NEGATIVE
Glucose, UA: NEGATIVE
Ketones, UA: NEGATIVE
Nitrite, UA: POSITIVE — AB
PH UA: 5.5 (ref 5.0–7.5)
Protein, UA: NEGATIVE
Specific Gravity, UA: <1.005 — ABNORMAL LOW (ref 1.005–1.030)
Urobilinogen, Ur: 0.2 mg/dL (ref 0.2–1.0)

## 2015-03-31 LAB — MICROSCOPIC EXAMINATION

## 2015-03-31 MED ORDER — AMLODIPINE BESYLATE 5 MG PO TABS: 5.0000 mg | ORAL_TABLET | Freq: Every day | ORAL | Status: DC

## 2015-03-31 MED ORDER — METOPROLOL SUCCINATE ER 50 MG PO TB24: 50.0000 mg | ORAL_TABLET | Freq: Every day | ORAL | Status: DC

## 2015-03-31 MED ORDER — SERTRALINE HCL 100 MG PO TABS: 100.0000 mg | ORAL_TABLET | Freq: Every day | ORAL | Status: DC

## 2015-03-31 MED ORDER — BENAZEPRIL HCL 40 MG PO TABS: 40.0000 mg | ORAL_TABLET | Freq: Every day | ORAL | Status: DC

## 2015-03-31 MED ORDER — RANITIDINE HCL 75 MG PO TABS: 75.0000 mg | ORAL_TABLET | Freq: Two times a day (BID) | ORAL | Status: DC

## 2015-03-31 MED ORDER — LUBIPROSTONE 24 MCG PO CAPS: 24.0000 ug | ORAL_CAPSULE | Freq: Two times a day (BID) | ORAL | Status: DC

## 2015-03-31 MED ORDER — ATORVASTATIN CALCIUM 40 MG PO TABS: 40.0000 mg | ORAL_TABLET | Freq: Every day | ORAL | Status: DC

## 2015-03-31 NOTE — Progress Notes (Signed)
BP 117/78 mmHg  Pulse 73  Temp(Src) 98.5 F (36.9 C)  Ht 5' 6.2" (1.681 m)  Wt 129 lb (58.514 kg)  BMI 20.71 kg/m2  SpO2 99%   Subjective:    Patient ID: Caitlyn May, female    DOB: May 19, 1956, 59 y.o.   MRN: [id number]  HPI: Caitlyn May is a 59 y.o. female  Chief Complaint  Patient presents with  . Annual Exam   patient's chronic pains ongoing still working on getting an pain clinic needs to change the name on her Medicaid card. Blood pressures been doing well no issues with medications taken faithfully Patient stable with medication rare use of nausea medicine Anxiety patient taking Xanax 2 mg half a tablet up to 4 times a day but hardly ever takes that much  Relevant past medical, surgical, family and social history reviewed and updated as indicated. Interim medical history since our last visit reviewed. Allergies and medications reviewed and updated.  Review of Systems  Constitutional: Negative.   HENT: Negative.   Eyes: Negative.   Respiratory: Negative.   Cardiovascular: Negative.   Gastrointestinal: Negative.   Endocrine: Negative.   Genitourinary: Negative.   Musculoskeletal: Negative.        Patient's bilateral feet hurt more in the heel and then radiating around the arch and top of her foot. As his ongoing for a month or so her cigarette rest and with activity. Discomfort is with or without shoes or with activity or walking. No cramps in legs with walking or activity in foot.  Skin: Negative.   Allergic/Immunologic: Negative.   Neurological: Negative.   Hematological: Negative.   Psychiatric/Behavioral: Negative.     Per HPI unless specifically indicated above     Objective:    BP 117/78 mmHg  Pulse 73  Temp(Src) 98.5 F (36.9 C)  Ht 5' 6.2" (1.681 m)  Wt 129 lb (58.514 kg)  BMI 20.71 kg/m2  SpO2 99%  Wt Readings from Last 3 Encounters:  03/31/15 129 lb (58.514 kg)  01/26/15 134 lb (60.782 kg)  11/24/14 139 lb 3.2 oz (63.141  kg)    Physical Exam  Constitutional: She is oriented to person, place, and time. She appears well-developed and well-nourished.  HENT:  Head: Normocephalic and atraumatic.  Right Ear: External ear normal.  Left Ear: External ear normal.  Nose: Nose normal.  Mouth/Throat: Oropharynx is clear and moist.  Eyes: Conjunctivae and EOM are normal. Pupils are equal, round, and reactive to light.  Neck: Normal range of motion. Neck supple. Carotid bruit is not present.  Cardiovascular: Normal rate, regular rhythm and normal heart sounds.   No murmur heard. Pulmonary/Chest: Effort normal and breath sounds normal. She exhibits no mass. Right breast exhibits no mass, no skin change and no tenderness. Left breast exhibits no mass, no skin change and no tenderness. Breasts are symmetrical.  Abdominal: Soft. Bowel sounds are normal. There is no hepatosplenomegaly.  Musculoskeletal: Normal range of motion.  Neurological: She is alert and oriented to person, place, and time.  Skin: No rash noted.  Psychiatric: She has a normal mood and affect. Her behavior is normal. Judgment and thought content normal.    Results for orders placed or performed in visit on 11/24/14  CBC With Differential/Platelet  Result Value Ref Range   WBC 7.5 3.4 - 10.8 x10E3/uL   RBC 4.66 3.77 - 5.28 x10E6/uL   Hemoglobin 14.5 11.1 - 15.9 g/dL   Hematocrit 42.4 34.0 - 46.6 %  MCV 91 79 - 97 fL   MCH 31.1 26.6 - 33.0 pg   MCHC 34.2 31.5 - 35.7 g/dL   RDW 13.0 12.3 - 15.4 %   Platelets 231 150 - 379 x10E3/uL   Neutrophils 59 %   Lymphs 30 %   MID 11 %   Neutrophils Absolute 4.4 1.4 - 7.0 x10E3/uL   Lymphocytes Absolute 2.3 0.7 - 3.1 x10E3/uL   MID (Absolute) 0.8 0.1 - 1.6 X10E3/uL  Comprehensive metabolic panel  Result Value Ref Range   Glucose 106 (H) 65 - 99 mg/dL   BUN 13 8 - 27 mg/dL   Creatinine, Ser 0.94 0.57 - 1.00 mg/dL   GFR calc non Af Amer 66 >59 mL/min/1.73   GFR calc Af Amer 76 >59 mL/min/1.73    BUN/Creatinine Ratio 14 11 - 26   Sodium 139 134 - 144 mmol/L   Potassium 4.3 3.5 - 5.2 mmol/L   Chloride 99 97 - 108 mmol/L   CO2 23 18 - 29 mmol/L   Calcium 9.2 8.7 - 10.3 mg/dL   Total Protein 7.5 6.0 - 8.5 g/dL   Albumin 4.4 3.6 - 4.8 g/dL   Globulin, Total 3.1 1.5 - 4.5 g/dL   Albumin/Globulin Ratio 1.4 1.1 - 2.5   Bilirubin Total 0.2 0.0 - 1.2 mg/dL   Alkaline Phosphatase 119 (H) 39 - 117 IU/L   AST 12 0 - 40 IU/L   ALT 8 0 - 32 IU/L  Amylase  Result Value Ref Range   Amylase 86 31 - 124 U/L  Lipase  Result Value Ref Range   Lipase 34 0 - 59 U/L  IFOBT POC (occult bld, rslt in office)  Result Value Ref Range   IFOBT Positive       Assessment & Plan:   Problem List Items Addressed This Visit      Cardiovascular and Mediastinum   Hypertension    The current medical regimen is effective;  continue present plan and medications.       Relevant Medications   amLODipine (NORVASC) 5 MG tablet   atorvastatin (LIPITOR) 40 MG tablet   benazepril (LOTENSIN) 40 MG tablet   metoprolol succinate (TOPROL-XL) 50 MG 24 hr tablet   Other Relevant Orders   Comprehensive metabolic panel   Lipid panel   CBC with Differential/Platelet   Urinalysis, Routine w reflex microscopic (not at [facility])   TSH     Other   Postlaminectomy syndrome, lumbar region    Discuss back pain with patient will continue current medications patient getting in pain clinic Discussed most likely causing patient's foot pain will be assessed further pain clinic as needed      Relevant Orders   Comprehensive metabolic panel   Lipid panel   CBC with Differential/Platelet   Urinalysis, Routine w reflex microscopic (not at [facility])   TSH   Depression    Depression anxiety Discuss use of Xanax taking up to 4 mg a day most days are 3 mg a day Discuss need to taper use of Xanax will do by 0.5 mg a month So will start using 2-1/2 mg a day for this month then 2 mg next month At some point we'll need to change to  another anxiety medication       Relevant Medications   sertraline (ZOLOFT) 100 MG tablet   Other Relevant Orders   Comprehensive metabolic panel   Lipid panel   CBC with Differential/Platelet   Urinalysis, Routine w reflex microscopic (not  at [facility])   TSH    Other Visit Diagnoses    Breast cancer screening    -  Primary    Relevant Orders    MM Digital Diagnostic Bilat    Colon cancer screening        Relevant Orders    Ambulatory referral to General Surgery    Immunization due        Relevant Orders    Flu Vaccine QUAD 36+ mos PF IM (Fluarix & Fluzone Quad PF) (Completed)    PE (physical exam), annual        Relevant Orders    Comprehensive metabolic panel    Lipid panel    CBC with Differential/Platelet    Urinalysis, Routine w reflex microscopic (not at [facility])    TSH    Fecal impaction of colon (HCC)        Relevant Medications    lubiprostone (AMITIZA) 24 MCG capsule    Other Relevant Orders    Comprehensive metabolic panel    Lipid panel    CBC with Differential/Platelet    Urinalysis, Routine w reflex microscopic (not at [facility])    TSH        Follow up plan: Return in about 3 months (around 07/01/2015) for Pain and anxiety recheck.

## 2015-03-31 NOTE — Assessment & Plan Note (Signed)
Depression anxiety Discuss use of Xanax taking up to 4 mg a day most days are 3 mg a day Discuss need to taper use of Xanax will do by 0.5 mg a month So will start using 2-1/2 mg a day for this month then 2 mg next month At some point we'll need to change to another anxiety medication

## 2015-03-31 NOTE — Assessment & Plan Note (Signed)
The current medical regimen is effective;  continue present plan and medications.  

## 2015-03-31 NOTE — Assessment & Plan Note (Signed)
Discuss back pain with patient will continue current medications patient getting in pain clinic Discussed most likely causing patient's foot pain will be assessed further pain clinic as needed

## 2015-04-01 ENCOUNTER — Telehealth: Payer: Self-pay | Admitting: Family Medicine

## 2015-04-01 DIAGNOSIS — N183 Chronic kidney disease, stage 3 unspecified: Secondary | ICD-10-CM

## 2015-04-01 DIAGNOSIS — R7989 Other specified abnormal findings of blood chemistry: Secondary | ICD-10-CM

## 2015-04-01 LAB — COMPREHENSIVE METABOLIC PANEL
ALK PHOS: 129 IU/L — AB (ref 39–117)
ALT: 8 IU/L (ref 0–32)
AST: 14 IU/L (ref 0–40)
Albumin/Globulin Ratio: 1.3 (ref 1.1–2.5)
Albumin: 4.1 g/dL (ref 3.6–4.8)
BUN/Creatinine Ratio: 6 — ABNORMAL LOW (ref 11–26)
BUN: 7 mg/dL — ABNORMAL LOW (ref 8–27)
Bilirubin Total: 0.2 mg/dL (ref 0.0–1.2)
CALCIUM: 9.2 mg/dL (ref 8.7–10.3)
CO2: 23 mmol/L (ref 18–29)
CREATININE: 1.11 mg/dL — AB (ref 0.57–1.00)
Chloride: 99 mmol/L (ref 97–106)
GFR calc Af Amer: 62 mL/min/{1.73_m2} (ref >59)
GFR calc non Af Amer: 54 mL/min/{1.73_m2} — ABNORMAL LOW (ref >59)
GLUCOSE: 94 mg/dL (ref 65–99)
Globulin, Total: 3.2 g/dL (ref 1.5–4.5)
POTASSIUM: 4.2 mmol/L (ref 3.5–5.2)
Sodium: 138 mmol/L (ref 136–144)
Total Protein: 7.3 g/dL (ref 6.0–8.5)

## 2015-04-01 LAB — CBC WITH DIFFERENTIAL/PLATELET
Basophils Absolute: 0 10*3/uL (ref 0.0–0.2)
Basos: 0 %
EOS (ABSOLUTE): 0.1 10*3/uL (ref 0.0–0.4)
EOS: 1 %
HEMATOCRIT: 41.5 % (ref 34.0–46.6)
HEMOGLOBIN: 14.3 g/dL (ref 11.1–15.9)
IMMATURE GRANS (ABS): 0 10*3/uL (ref 0.0–0.1)
IMMATURE GRANULOCYTES: 0 %
LYMPHS: 25 %
Lymphocytes Absolute: 2.2 10*3/uL (ref 0.7–3.1)
MCH: 31 pg (ref 26.6–33.0)
MCHC: 34.5 g/dL (ref 31.5–35.7)
MCV: 90 fL (ref 79–97)
MONOCYTES: 9 %
Monocytes Absolute: 0.8 10*3/uL (ref 0.1–0.9)
NEUTROS ABS: 5.5 10*3/uL (ref 1.4–7.0)
Neutrophils: 65 %
Platelets: 303 10*3/uL (ref 150–379)
RBC: 4.61 x10E6/uL (ref 3.77–5.28)
RDW: 13.3 % (ref 12.3–15.4)
WBC: 8.7 10*3/uL (ref 3.4–10.8)

## 2015-04-01 LAB — LIPID PANEL
Chol/HDL Ratio: 4.3 ratio units (ref 0.0–4.4)
Cholesterol, Total: 185 mg/dL (ref 100–199)
HDL: 43 mg/dL (ref >39)
LDL CALC: 106 mg/dL — AB (ref 0–99)
TRIGLYCERIDES: 179 mg/dL — AB (ref 0–149)
VLDL Cholesterol Cal: 36 mg/dL (ref 5–40)

## 2015-04-01 LAB — TSH: TSH: 7.27 u[IU]/mL — ABNORMAL HIGH (ref 0.450–4.500)

## 2015-04-01 NOTE — Telephone Encounter (Signed)
-----   Message from Wynn Maudlin, [place] sent at 04/01/2015  4:57 PM EDT ----- labs

## 2015-04-01 NOTE — Telephone Encounter (Signed)
Phone call Discussed with patient renal function slight decline patient not taking any nephrotoxic agents will recheck BMP next office visit 3 months TSH slight elevation patient with no thyroid symptoms will recheck TSH in 3 months

## 2015-04-06 ENCOUNTER — Encounter: Payer: Self-pay | Admitting: Family Medicine

## 2015-04-06 DIAGNOSIS — K5903 Drug induced constipation: Secondary | ICD-10-CM | POA: Insufficient documentation

## 2015-04-06 DIAGNOSIS — T402X5A Adverse effect of other opioids, initial encounter: Secondary | ICD-10-CM | POA: Insufficient documentation

## 2015-04-07 ENCOUNTER — Other Ambulatory Visit: Payer: Self-pay | Admitting: Family Medicine

## 2015-04-07 MED ORDER — OXYCODONE HCL 20 MG PO TABS: 1.0000 | ORAL_TABLET | Freq: Four times a day (QID) | ORAL | Status: DC

## 2015-04-22 ENCOUNTER — Other Ambulatory Visit: Payer: Self-pay | Admitting: Family Medicine

## 2015-04-22 MED ORDER — OXYCODONE HCL 20 MG PO TABS: 1.0000 | ORAL_TABLET | Freq: Four times a day (QID) | ORAL | Status: DC

## 2015-05-19 ENCOUNTER — Other Ambulatory Visit: Payer: Self-pay | Admitting: Family Medicine

## 2015-05-19 ENCOUNTER — Telehealth: Payer: Self-pay | Admitting: Family Medicine

## 2015-05-19 DIAGNOSIS — F32A Depression, unspecified: Secondary | ICD-10-CM

## 2015-05-19 DIAGNOSIS — F329 Major depressive disorder, single episode, unspecified: Secondary | ICD-10-CM

## 2015-05-19 MED ORDER — ALPRAZOLAM 2 MG PO TABS: 2.0000 mg | ORAL_TABLET | Freq: Four times a day (QID) | ORAL | Status: DC

## 2015-05-19 NOTE — Telephone Encounter (Signed)
Pt needs her Xanax prescription, she only has 2 left.  She is stopping by in the morning on her way to another appointment.

## 2015-06-03 ENCOUNTER — Other Ambulatory Visit: Payer: Self-pay | Admitting: Family Medicine

## 2015-06-03 MED ORDER — OXYCODONE HCL 20 MG PO TABS: 1.0000 | ORAL_TABLET | Freq: Four times a day (QID) | ORAL | Status: DC

## 2015-06-29 ENCOUNTER — Other Ambulatory Visit: Payer: Self-pay | Admitting: Family Medicine

## 2015-06-29 MED ORDER — OXYCODONE HCL 20 MG PO TABS: 1.0000 | ORAL_TABLET | Freq: Four times a day (QID) | ORAL | Status: DC

## 2015-07-08 ENCOUNTER — Encounter: Payer: Self-pay | Admitting: Family Medicine

## 2015-07-08 ENCOUNTER — Ambulatory Visit (INDEPENDENT_AMBULATORY_CARE_PROVIDER_SITE_OTHER): Payer: Medicare Other | Admitting: Family Medicine

## 2015-07-08 VITALS — BP 111/71 | HR 81 | Temp 98.8°F | Ht 65.4 in | Wt 128.0 lb

## 2015-07-08 DIAGNOSIS — M961 Postlaminectomy syndrome, not elsewhere classified: Secondary | ICD-10-CM | POA: Diagnosis not present

## 2015-07-08 DIAGNOSIS — F329 Major depressive disorder, single episode, unspecified: Secondary | ICD-10-CM | POA: Diagnosis not present

## 2015-07-08 DIAGNOSIS — I10 Essential (primary) hypertension: Secondary | ICD-10-CM | POA: Diagnosis not present

## 2015-07-08 DIAGNOSIS — F32A Depression, unspecified: Secondary | ICD-10-CM

## 2015-07-08 DIAGNOSIS — R946 Abnormal results of thyroid function studies: Secondary | ICD-10-CM

## 2015-07-08 DIAGNOSIS — R7989 Other specified abnormal findings of blood chemistry: Secondary | ICD-10-CM | POA: Insufficient documentation

## 2015-07-08 DIAGNOSIS — R300 Dysuria: Secondary | ICD-10-CM | POA: Diagnosis not present

## 2015-07-08 LAB — MICROSCOPIC EXAMINATION

## 2015-07-08 LAB — URINALYSIS, ROUTINE W REFLEX MICROSCOPIC
BILIRUBIN UA: NEGATIVE
GLUCOSE, UA: NEGATIVE
NITRITE UA: NEGATIVE
Protein, UA: NEGATIVE
Specific Gravity, UA: 1.025 (ref 1.005–1.030)
Urobilinogen, Ur: 0.2 mg/dL (ref 0.2–1.0)
pH, UA: 5.5 (ref 5.0–7.5)

## 2015-07-08 MED ORDER — ALPRAZOLAM 0.5 MG PO TABS: 0.5000 mg | ORAL_TABLET | Freq: Every evening | ORAL | Status: DC | PRN

## 2015-07-08 NOTE — Progress Notes (Signed)
BP 111/71 mmHg  Pulse 81  Temp(Src) 98.8 F (37.1 C)  Ht 5' 5.4" (1.661 m)  Wt 128 lb (58.06 kg)  BMI 21.04 kg/m2  SpO2 97%   Subjective:    Patient ID: Caitlyn May, female    DOB: 08/17/1955, 59 y.o.   MRN: [id number]  HPI: Caitlyn May is a 60 y.o. female  Chief Complaint  Patient presents with  . Pain  . Anxiety   Patient working on getting an appointment at pain clinic Wanted to cut back on Xanax is doing stable on medication Having some frequency urgency dysuria spent ongoing for several days Relevant past medical, surgical, family and social history reviewed and updated as indicated. Interim medical history since our last visit reviewed. Allergies and medications reviewed and updated.  Review of Systems  Constitutional: Negative.   Respiratory: Negative.   Cardiovascular: Negative.     Per HPI unless specifically indicated above     Objective:    BP 111/71 mmHg  Pulse 81  Temp(Src) 98.8 F (37.1 C)  Ht 5' 5.4" (1.661 m)  Wt 128 lb (58.06 kg)  BMI 21.04 kg/m2  SpO2 97%  Wt Readings from Last 3 Encounters:  07/08/15 128 lb (58.06 kg)  03/31/15 129 lb (58.514 kg)  01/26/15 134 lb (60.782 kg)    Physical Exam  Constitutional: She is oriented to person, place, and time. She appears well-developed and well-nourished. No distress.  HENT:  Head: Normocephalic and atraumatic.  Right Ear: Hearing normal.  Left Ear: Hearing normal.  Nose: Nose normal.  Eyes: Conjunctivae and lids are normal. Right eye exhibits no discharge. Left eye exhibits no discharge. No scleral icterus.  Cardiovascular: Normal rate, regular rhythm and normal heart sounds.   Pulmonary/Chest: Effort normal and breath sounds normal. No respiratory distress.  Musculoskeletal: Normal range of motion.  Neurological: She is alert and oriented to person, place, and time.  Skin: Skin is intact. No rash noted.  Psychiatric: She has a normal mood and affect. Her speech is normal  and behavior is normal. Judgment and thought content normal. Cognition and memory are normal.    Results for orders placed or performed in visit on 03/31/15  Microscopic Examination  Result Value Ref Range   WBC, UA 0-5 0 -  5 /hpf   RBC, UA 0-2 0 -  2 /hpf   Epithelial Cells (non renal) 0-10 0 - 10 /hpf   Bacteria, UA Moderate (A) None seen/Few  Comprehensive metabolic panel  Result Value Ref Range   Glucose 94 65 - 99 mg/dL   BUN 7 (L) 8 - 27 mg/dL   Creatinine, Ser 1.11 (H) 0.57 - 1.00 mg/dL   GFR calc non Af Amer 54 (L) >59 mL/min/1.73   GFR calc Af Amer 62 >59 mL/min/1.73   BUN/Creatinine Ratio 6 (L) 11 - 26   Sodium 138 136 - 144 mmol/L   Potassium 4.2 3.5 - 5.2 mmol/L   Chloride 99 97 - 106 mmol/L   CO2 23 18 - 29 mmol/L   Calcium 9.2 8.7 - 10.3 mg/dL   Total Protein 7.3 6.0 - 8.5 g/dL   Albumin 4.1 3.6 - 4.8 g/dL   Globulin, Total 3.2 1.5 - 4.5 g/dL   Albumin/Globulin Ratio 1.3 1.1 - 2.5   Bilirubin Total 0.2 0.0 - 1.2 mg/dL   Alkaline Phosphatase 129 (H) 39 - 117 IU/L   AST 14 0 - 40 IU/L   ALT 8 0 - 32 IU/L  Lipid panel  Result Value Ref Range   Cholesterol, Total 185 100 - 199 mg/dL   Triglycerides 179 (H) 0 - 149 mg/dL   HDL 43 >39 mg/dL   VLDL Cholesterol Cal 36 5 - 40 mg/dL   LDL Calculated 106 (H) 0 - 99 mg/dL   Chol/HDL Ratio 4.3 0.0 - 4.4 ratio units  CBC with Differential/Platelet  Result Value Ref Range   WBC 8.7 3.4 - 10.8 x10E3/uL   RBC 4.61 3.77 - 5.28 x10E6/uL   Hemoglobin 14.3 11.1 - 15.9 g/dL   Hematocrit 41.5 34.0 - 46.6 %   MCV 90 79 - 97 fL   MCH 31.0 26.6 - 33.0 pg   MCHC 34.5 31.5 - 35.7 g/dL   RDW 13.3 12.3 - 15.4 %   Platelets 303 150 - 379 x10E3/uL   Neutrophils 65 %   Lymphs 25 %   Monocytes 9 %   Eos 1 %   Basos 0 %   Neutrophils Absolute 5.5 1.4 - 7.0 x10E3/uL   Lymphocytes Absolute 2.2 0.7 - 3.1 x10E3/uL   Monocytes Absolute 0.8 0.1 - 0.9 x10E3/uL   EOS (ABSOLUTE) 0.1 0.0 - 0.4 x10E3/uL   Basophils Absolute 0.0 0.0 - 0.2  x10E3/uL   Immature Granulocytes 0 %   Immature Grans (Abs) 0.0 0.0 - 0.1 x10E3/uL  Urinalysis, Routine w reflex microscopic (not at [facility])  Result Value Ref Range   Specific Gravity, UA <1.005 (L) 1.005 - 1.030   pH, UA 5.5 5.0 - 7.5   Color, UA Yellow Yellow   Appearance Ur Clear Clear   Leukocytes, UA 1+ (A) Negative   Protein, UA Negative Negative/Trace   Glucose, UA Negative Negative   Ketones, UA Negative Negative   RBC, UA Trace (A) Negative   Bilirubin, UA Negative Negative   Urobilinogen, Ur 0.2 0.2 - 1.0 mg/dL   Nitrite, UA Positive (A) Negative   Microscopic Examination See below:   TSH  Result Value Ref Range   TSH 7.270 (H) 0.450 - 4.500 uIU/mL      Assessment & Plan:   Problem List Items Addressed This Visit      Cardiovascular and Mediastinum   Hypertension - Primary   Relevant Orders   Basic metabolic panel     Other   Postlaminectomy syndrome, lumbar region    Will be getting referral to pain clinic soon      Elevated TSH   Relevant Orders   TSH   Dysuria   Relevant Orders   Urinalysis, Routine w reflex microscopic (not at [facility])   Urine culture   Depression    Discuss reducing Xanax slowly by 0.5 mg every 2 weeks      Relevant Medications   ALPRAZolam (XANAX) 0.5 MG tablet       Follow up plan: Return in about 3 months (around 10/05/2015) for med check.

## 2015-07-08 NOTE — Assessment & Plan Note (Signed)
Will be getting referral to pain clinic soon

## 2015-07-08 NOTE — Assessment & Plan Note (Addendum)
Discuss reducing Xanax slowly by 0.5 mg every 2 weeks

## 2015-07-09 ENCOUNTER — Encounter: Payer: Self-pay | Admitting: Family Medicine

## 2015-07-09 LAB — BASIC METABOLIC PANEL
BUN/Creatinine Ratio: 8 — ABNORMAL LOW (ref 11–26)
BUN: 9 mg/dL (ref 8–27)
CALCIUM: 9 mg/dL (ref 8.7–10.3)
CO2: 23 mmol/L (ref 18–29)
Chloride: 101 mmol/L (ref 96–106)
Creatinine, Ser: 1.1 mg/dL — ABNORMAL HIGH (ref 0.57–1.00)
GFR calc Af Amer: 63 mL/min/{1.73_m2} (ref >59)
GFR, EST NON AFRICAN AMERICAN: 55 mL/min/{1.73_m2} — AB (ref >59)
Glucose: 106 mg/dL — ABNORMAL HIGH (ref 65–99)
POTASSIUM: 4.9 mmol/L (ref 3.5–5.2)
SODIUM: 140 mmol/L (ref 134–144)

## 2015-07-09 LAB — TSH: TSH: 5.1 u[IU]/mL — ABNORMAL HIGH (ref 0.450–4.500)

## 2015-07-10 LAB — URINE CULTURE

## 2015-07-13 MED ORDER — NITROFURANTOIN MONOHYD MACRO 100 MG PO CAPS: 100.0000 mg | ORAL_CAPSULE | Freq: Two times a day (BID) | ORAL | Status: DC

## 2015-07-13 NOTE — Addendum Note (Signed)
Addended byGolden Pop on: 07/13/2015 12:19 PM   Modules accepted: Orders

## 2015-07-22 ENCOUNTER — Telehealth: Payer: Self-pay | Admitting: Family Medicine

## 2015-07-22 DIAGNOSIS — M961 Postlaminectomy syndrome, not elsewhere classified: Secondary | ICD-10-CM

## 2015-07-22 MED ORDER — CYCLOBENZAPRINE HCL 10 MG PO TABS: 10.0000 mg | ORAL_TABLET | Freq: Three times a day (TID) | ORAL | Status: DC | PRN

## 2015-07-22 NOTE — Telephone Encounter (Signed)
Pt would like to have flexeril sent to [facility].

## 2015-07-22 NOTE — Telephone Encounter (Signed)
Forward to provider

## 2015-07-23 ENCOUNTER — Other Ambulatory Visit: Payer: Self-pay | Admitting: Family Medicine

## 2015-07-23 MED ORDER — OXYCODONE HCL 20 MG PO TABS: 1.0000 | ORAL_TABLET | Freq: Four times a day (QID) | ORAL | Status: DC

## 2015-07-24 ENCOUNTER — Telehealth: Payer: Self-pay | Admitting: Family Medicine

## 2015-07-24 NOTE — Telephone Encounter (Signed)
Pt called and stated that an appeal was filed for her nasonex and we should hear something in the next 7 days.(FYI)

## 2015-08-17 ENCOUNTER — Telehealth: Payer: Self-pay

## 2015-08-17 NOTE — Telephone Encounter (Signed)
[name] wants  Alprazolam .5mg 

## 2015-08-17 NOTE — Telephone Encounter (Signed)
Call pt 

## 2015-08-18 ENCOUNTER — Other Ambulatory Visit: Payer: Self-pay | Admitting: Family Medicine

## 2015-08-18 MED ORDER — ALPRAZOLAM 0.5 MG PO TABS: 0.5000 mg | ORAL_TABLET | Freq: Every evening | ORAL | Status: DC | PRN

## 2015-08-18 NOTE — Progress Notes (Signed)
Patient doing well with decreasing Xanax dose will continue with refills decreasing dose.

## 2015-08-24 ENCOUNTER — Other Ambulatory Visit: Payer: Self-pay | Admitting: Family Medicine

## 2015-08-24 MED ORDER — OXYCODONE HCL 20 MG PO TABS: 1.0000 | ORAL_TABLET | Freq: Four times a day (QID) | ORAL | Status: DC

## 2015-09-14 ENCOUNTER — Other Ambulatory Visit: Payer: Self-pay | Admitting: Family Medicine

## 2015-09-14 MED ORDER — OXYCODONE HCL 20 MG PO TABS: 1.0000 | ORAL_TABLET | Freq: Four times a day (QID) | ORAL | Status: DC

## 2015-10-13 ENCOUNTER — Other Ambulatory Visit: Payer: Self-pay | Admitting: Family Medicine

## 2015-10-13 MED ORDER — OXYCODONE HCL 20 MG PO TABS: 1.0000 | ORAL_TABLET | Freq: Four times a day (QID) | ORAL | Status: DC

## 2015-10-21 ENCOUNTER — Encounter: Payer: Self-pay | Admitting: Family Medicine

## 2015-10-21 ENCOUNTER — Ambulatory Visit (INDEPENDENT_AMBULATORY_CARE_PROVIDER_SITE_OTHER): Payer: Medicare Other | Admitting: Family Medicine

## 2015-10-21 VITALS — BP 92/62 | HR 81 | Temp 98.2°F | Ht 65.4 in | Wt 127.0 lb

## 2015-10-21 DIAGNOSIS — F329 Major depressive disorder, single episode, unspecified: Secondary | ICD-10-CM

## 2015-10-21 DIAGNOSIS — M961 Postlaminectomy syndrome, not elsewhere classified: Secondary | ICD-10-CM | POA: Diagnosis not present

## 2015-10-21 DIAGNOSIS — F32A Depression, unspecified: Secondary | ICD-10-CM

## 2015-10-21 DIAGNOSIS — I10 Essential (primary) hypertension: Secondary | ICD-10-CM

## 2015-10-21 MED ORDER — ALPRAZOLAM 0.5 MG PO TABS: 0.5000 mg | ORAL_TABLET | Freq: Two times a day (BID) | ORAL | Status: DC | PRN

## 2015-10-21 NOTE — Assessment & Plan Note (Signed)
The current medical regimen is effective;  continue present plan and medications.  

## 2015-10-21 NOTE — Progress Notes (Signed)
BP 92/62 mmHg  Pulse 81  Temp(Src) 98.2 F (36.8 C)  Ht 5' 5.4" (1.661 m)  Wt 127 lb (57.607 kg)  BMI 20.88 kg/m2  SpO2 99%   Subjective:    Patient ID: Caitlyn May, female    DOB: 05/10/1956, 61 y.o.   MRN: [id number]  HPI: Caitlyn May is a 60 y.o. female  Chief Complaint  Patient presents with  . med check   Recheck chronic back pain for postlaminectomy syndrome chronic back pain patient's been taking narcotics long-term. Reviewed again next month will be last prescription for narcotics given Depression nerves all stable not a lot of difference Patient's been able to cut back on Xanax as directed taking Xanax 0.5 twice a day now which is a big reduction Blood pressure doing well  Relevant past medical, surgical, family and social history reviewed and updated as indicated. Interim medical history since our last visit reviewed. Allergies and medications reviewed and updated.  Review of Systems  Constitutional: Negative.   Respiratory: Negative.   Cardiovascular: Negative.     Per HPI unless specifically indicated above     Objective:    BP 92/62 mmHg  Pulse 81  Temp(Src) 98.2 F (36.8 C)  Ht 5' 5.4" (1.661 m)  Wt 127 lb (57.607 kg)  BMI 20.88 kg/m2  SpO2 99%  Wt Readings from Last 3 Encounters:  10/21/15 127 lb (57.607 kg)  07/08/15 128 lb (58.06 kg)  03/31/15 129 lb (58.514 kg)    Physical Exam  Constitutional: She is oriented to person, place, and time. She appears well-developed and well-nourished. No distress.  HENT:  Head: Normocephalic and atraumatic.  Right Ear: Hearing normal.  Left Ear: Hearing normal.  Nose: Nose normal.  Eyes: Conjunctivae and lids are normal. Right eye exhibits no discharge. Left eye exhibits no discharge. No scleral icterus.  Cardiovascular: Normal rate, regular rhythm and normal heart sounds.   Pulmonary/Chest: Effort normal and breath sounds normal. No respiratory distress.  Musculoskeletal: Normal  range of motion.  Neurological: She is alert and oriented to person, place, and time.  Skin: Skin is intact. No rash noted.  Psychiatric: She has a normal mood and affect. Her speech is normal and behavior is normal. Judgment and thought content normal. Cognition and memory are normal.    Results for orders placed or performed in visit on 07/08/15  Urine culture  Result Value Ref Range   Urine Culture, Routine Final report (A)    Urine Culture result 1 Comment (A)    ANTIMICROBIAL SUSCEPTIBILITY Comment   Microscopic Examination  Result Value Ref Range   WBC, UA 0-5 0 -  5 /hpf   RBC, UA 3-10 (A) 0 -  2 /hpf   Epithelial Cells (non renal) 0-10 0 - 10 /hpf   Casts Present (A) None seen /lpf   Cast Type Hyaline casts N/A   Mucus, UA Present Not Estab.   Bacteria, UA Few None seen/Few  TSH  Result Value Ref Range   TSH 5.100 (H) 0.450 - 4.500 uIU/mL  Basic metabolic panel  Result Value Ref Range   Glucose 106 (H) 65 - 99 mg/dL   BUN 9 8 - 27 mg/dL   Creatinine, Ser 1.10 (H) 0.57 - 1.00 mg/dL   GFR calc non Af Amer 55 (L) >59 mL/min/1.73   GFR calc Af Amer 63 >59 mL/min/1.73   BUN/Creatinine Ratio 8 (L) 11 - 26   Sodium 140 134 - 144 mmol/L  Potassium 4.9 3.5 - 5.2 mmol/L   Chloride 101 96 - 106 mmol/L   CO2 23 18 - 29 mmol/L   Calcium 9.0 8.7 - 10.3 mg/dL  Urinalysis, Routine w reflex microscopic (not at [facility])  Result Value Ref Range   Specific Gravity, UA 1.025 1.005 - 1.030   pH, UA 5.5 5.0 - 7.5   Color, UA Yellow Yellow   Appearance Ur Cloudy (A) Clear   Leukocytes, UA Trace (A) Negative   Protein, UA Negative Negative/Trace   Glucose, UA Negative Negative   Ketones, UA Trace (A) Negative   RBC, UA 2+ (A) Negative   Bilirubin, UA Negative Negative   Urobilinogen, Ur 0.2 0.2 - 1.0 mg/dL   Nitrite, UA Negative Negative   Microscopic Examination See below:       Assessment & Plan:   Problem List Items Addressed This Visit      Cardiovascular and Mediastinum    Hypertension - Primary    The current medical regimen is effective;  continue present plan and medications.         Other   Postlaminectomy syndrome, lumbar region    Discussed chronic pain narcotic withdrawal patient indicated she understands about narcotic withdrawal and difficulties. Patient understands neck prescription will be last for narcotics from this office Will continue Xanax at 0.5 twice a day for now. Discussed with patient about trying to taper her narcotics starting today. Going down from 4 day to 3 a day and gradually doing this every week or so.      Depression    The current medical regimen is effective;  continue present plan and medications.       Relevant Medications   ALPRAZolam (XANAX) 0.5 MG tablet       Follow up plan: Return in about 3 months (around 01/21/2016), or if symptoms worsen or fail to improve, for Chest.

## 2015-10-21 NOTE — Assessment & Plan Note (Signed)
Discussed chronic pain narcotic withdrawal patient indicated she understands about narcotic withdrawal and difficulties. Patient understands neck prescription will be last for narcotics from this office Will continue Xanax at 0.5 twice a day for now. Discussed with patient about trying to taper her narcotics starting today. Going down from 4 day to 3 a day and gradually doing this every week or so.

## 2015-11-15 ENCOUNTER — Other Ambulatory Visit: Payer: Self-pay | Admitting: Family Medicine

## 2015-11-15 MED ORDER — OXYCODONE HCL 20 MG PO TABS: 1.0000 | ORAL_TABLET | Freq: Four times a day (QID) | ORAL | Status: DC

## 2015-12-16 ENCOUNTER — Other Ambulatory Visit: Payer: Self-pay | Admitting: Family Medicine

## 2015-12-16 DIAGNOSIS — M961 Postlaminectomy syndrome, not elsewhere classified: Secondary | ICD-10-CM

## 2015-12-16 MED ORDER — CYCLOBENZAPRINE HCL 10 MG PO TABS: 10.0000 mg | ORAL_TABLET | Freq: Three times a day (TID) | ORAL | Status: DC | PRN

## 2015-12-16 NOTE — Telephone Encounter (Signed)
Routing to provider  

## 2015-12-16 NOTE — Telephone Encounter (Signed)
She would like it sent to [facility].

## 2015-12-16 NOTE — Telephone Encounter (Signed)
Pt needs refill on cyclobenzaprine (FLEXERIL) 10 MG tablet to last until appt on 01/21/2016.

## 2015-12-17 NOTE — Telephone Encounter (Signed)
RX was printed yesterday, Caitlyn May is not here to sign it, so I called the rx into the pharmacy for the patient.

## 2016-01-21 ENCOUNTER — Ambulatory Visit: Payer: Medicare Other | Admitting: Family Medicine

## 2016-01-29 ENCOUNTER — Encounter: Payer: Self-pay | Admitting: Family Medicine

## 2016-01-29 ENCOUNTER — Ambulatory Visit (INDEPENDENT_AMBULATORY_CARE_PROVIDER_SITE_OTHER): Payer: Medicare Other | Admitting: Family Medicine

## 2016-01-29 DIAGNOSIS — M961 Postlaminectomy syndrome, not elsewhere classified: Secondary | ICD-10-CM

## 2016-01-29 MED ORDER — CYCLOBENZAPRINE HCL 10 MG PO TABS: 10.0000 mg | ORAL_TABLET | Freq: Three times a day (TID) | ORAL | 3 refills | Status: DC | PRN

## 2016-01-29 NOTE — Progress Notes (Signed)
   BP 111/69   Pulse 61   Temp 98.6 F (37 C)   Wt 127 lb (57.6 kg)   SpO2 99%   BMI 20.88 kg/m    Subjective:    Patient ID: Caitlyn May, female    DOB: 08/19/1955, 60 y.o.   MRN: [id number]  HPI: [name] is a 60 y.o. female  Chief Complaint  Patient presents with  . Medication Refill    She needs a refill on Cyclobenzaprine.    Patient presents for routine follow up of back pain. States she has had a really hard time coming off her pain medicines and feels so run down all the time now that she's off of them. Refused going back to pain management as she had a terrible experience there in the past. Flexeril helps and she would like a refill today. Everything else going well. Coming in next week for regular follow up.   Relevant past medical, surgical, family and social history reviewed and updated as indicated. Interim medical history since our last visit reviewed. Allergies and medications reviewed and updated.  Review of Systems  Constitutional: Positive for fatigue.  HENT: Negative.   Respiratory: Negative.   Cardiovascular: Negative.   Gastrointestinal: Negative.   Genitourinary: Negative.   Musculoskeletal: Negative.   Skin: Negative.   Neurological: Negative.   Psychiatric/Behavioral: Negative.     Per HPI unless specifically indicated above     Objective:    BP 111/69   Pulse 61   Temp 98.6 F (37 C)   Wt 127 lb (57.6 kg)   SpO2 99%   BMI 20.88 kg/m   Wt Readings from Last 3 Encounters:  01/29/16 127 lb (57.6 kg)  10/21/15 127 lb (57.6 kg)  07/08/15 128 lb (58.1 kg)    Physical Exam  Constitutional: She is oriented to person, place, and time. She appears well-developed and well-nourished. No distress.  HENT:  Head: Atraumatic.  Eyes: Conjunctivae are normal. No scleral icterus.  Neck: Normal range of motion. Neck supple.  Cardiovascular: Normal rate and normal heart sounds.   Pulmonary/Chest: Effort normal. No respiratory  distress.  Musculoskeletal: Normal range of motion.  Neurological: She is alert and oriented to person, place, and time.  Skin: Skin is warm and dry.  Psychiatric: She has a normal mood and affect. Her behavior is normal.  Nursing note and vitals reviewed.     Assessment & Plan:   Problem List Items Addressed This Visit      Other   Postlaminectomy syndrome, lumbar region    Refilled flexeril. Patient understanding that she will have to go to pain clinic for any controlled pain medicines.      Relevant Medications   cyclobenzaprine (FLEXERIL) 10 MG tablet    Other Visit Diagnoses   None.      Follow up plan: Return for As scheduled.

## 2016-01-29 NOTE — Assessment & Plan Note (Signed)
Refilled flexeril. Patient understanding that she will have to go to pain clinic for any controlled pain medicines.

## 2016-02-10 ENCOUNTER — Encounter: Payer: Self-pay | Admitting: Family Medicine

## 2016-02-10 ENCOUNTER — Ambulatory Visit (INDEPENDENT_AMBULATORY_CARE_PROVIDER_SITE_OTHER): Payer: Medicare Other | Admitting: Family Medicine

## 2016-02-10 VITALS — BP 150/92 | HR 118 | Temp 99.5°F | Wt 128.0 lb

## 2016-02-10 DIAGNOSIS — Z23 Encounter for immunization: Secondary | ICD-10-CM

## 2016-02-10 DIAGNOSIS — F329 Major depressive disorder, single episode, unspecified: Secondary | ICD-10-CM

## 2016-02-10 DIAGNOSIS — J019 Acute sinusitis, unspecified: Secondary | ICD-10-CM | POA: Diagnosis not present

## 2016-02-10 DIAGNOSIS — R946 Abnormal results of thyroid function studies: Secondary | ICD-10-CM | POA: Diagnosis not present

## 2016-02-10 DIAGNOSIS — F32A Depression, unspecified: Secondary | ICD-10-CM

## 2016-02-10 DIAGNOSIS — I10 Essential (primary) hypertension: Secondary | ICD-10-CM | POA: Diagnosis not present

## 2016-02-10 DIAGNOSIS — M961 Postlaminectomy syndrome, not elsewhere classified: Secondary | ICD-10-CM

## 2016-02-10 DIAGNOSIS — R7989 Other specified abnormal findings of blood chemistry: Secondary | ICD-10-CM

## 2016-02-10 MED ORDER — AMLODIPINE BESYLATE 10 MG PO TABS: 5.0000 mg | ORAL_TABLET | Freq: Every day | ORAL | 6 refills | Status: DC

## 2016-02-10 MED ORDER — AMLODIPINE BESYLATE 10 MG PO TABS: 10.0000 mg | ORAL_TABLET | Freq: Every day | ORAL | 6 refills | Status: DC

## 2016-02-10 MED ORDER — AZITHROMYCIN 250 MG PO TABS: ORAL_TABLET | ORAL | 0 refills | Status: DC

## 2016-02-10 MED ORDER — CYCLOBENZAPRINE HCL 5 MG PO TABS: 5.0000 mg | ORAL_TABLET | Freq: Two times a day (BID) | ORAL | 2 refills | Status: DC | PRN

## 2016-02-10 NOTE — Progress Notes (Signed)
BP (!) 150/92 (BP Location: Right Arm, Cuff Size: Small)   Pulse (!) 118   Temp 99.5 F (37.5 C)   Wt 128 lb (58.1 kg) Comment: with shoes  SpO2 95%   BMI 21.04 kg/m    Subjective:    Patient ID: Caitlyn May, female    DOB: 04-05-1956, 60 y.o.   MRN: [id number]  HPI: Caitlyn May is a 60 y.o. female  Patient recheck multiple conditions #1 chronic pain from failed back has been off narcotics for over 2 months doing really well. Takes occasional Tylenol but otherwise doing okay. Does take Flexeril 10 mg on a when necessary basis as wondered about prescription as it's only 30 pills. This helps her with her back pain. Anxiety and Xanax use is been taken 0.5 mg 1 in the morning 1 in the evening and doing well and is been trying to cut back on that is now taking a half a tablet twice a day and is been able to maintain that lower dose. Elevated TSH on labs has been coming down we will recheck TSH again. Patient also having marked head cold sinus congestion and facial pressure feeling bad as been ongoing for 3 weeks and getting worse Blood pressure also markedly elevated patient's been taking both Benzapril and amlodipine faithfully. Is wondering if coming off pain medicines contributed to blood pressure elevation.  Relevant past medical, surgical, family and social history reviewed and updated as indicated. Interim medical history since our last visit reviewed. Allergies and medications reviewed and updated.  Review of Systems  Constitutional: Positive for fatigue and fever.  Respiratory: Negative.   Cardiovascular: Negative.     Per HPI unless specifically indicated above     Objective:    BP (!) 150/92 (BP Location: Right Arm, Cuff Size: Small)   Pulse (!) 118   Temp 99.5 F (37.5 C)   Wt 128 lb (58.1 kg) Comment: with shoes  SpO2 95%   BMI 21.04 kg/m   Wt Readings from Last 3 Encounters:  02/10/16 128 lb (58.1 kg)  01/29/16 127 lb (57.6 kg)  10/21/15  127 lb (57.6 kg)    Physical Exam  Constitutional: She is oriented to person, place, and time. She appears well-developed and well-nourished. No distress.  HENT:  Head: Normocephalic and atraumatic.  Right Ear: Hearing normal.  Left Ear: Hearing normal.  Nose: Nose normal.  Mouth/Throat: Oropharyngeal exudate present.  Eyes: Conjunctivae and lids are normal. Right eye exhibits no discharge. Left eye exhibits no discharge. No scleral icterus.  Cardiovascular: Normal rate, regular rhythm and normal heart sounds.   Pulmonary/Chest: Effort normal and breath sounds normal. No respiratory distress.  Musculoskeletal: Normal range of motion.  Neurological: She is alert and oriented to person, place, and time.  Skin: Skin is intact. No rash noted.  Psychiatric: She has a normal mood and affect. Her speech is normal and behavior is normal. Judgment and thought content normal. Cognition and memory are normal.        Assessment & Plan:   Problem List Items Addressed This Visit      Cardiovascular and Mediastinum   Hypertension    Blood pressure markedly elevated patient taking medicines faithfully will increase amlodipine from 5 mg to 10 mg is not having any side effects from meds.      Relevant Medications   amLODipine (NORVASC) 10 MG tablet     Other   Postlaminectomy syndrome, lumbar region    Chronic back pain  now being managed with Tylenol all in all doing okay we will do Flexeril 5 mg twice a day when necessary      Relevant Medications   cyclobenzaprine (FLEXERIL) 5 MG tablet   Depression    Patient still has Xanax refills and cutting in half taking 0.25 mg twice a day discuss further reductions in slow taper. Continue Zoloft same dose      Elevated TSH    Recheck elevated TSH      Relevant Orders   TSH    Other Visit Diagnoses    Immunization due    -  Primary   Relevant Orders   Flu Vaccine QUAD 36+ mos PF IM (Fluarix & Fluzone Quad PF) (Completed)   Acute  sinusitis, recurrence not specified, unspecified location       Discussed sinusitis care and treatment will do Z-Pak is that helped the most mucin X Tylenol as needed nasal rinse   Relevant Medications   azithromycin (ZITHROMAX) 250 MG tablet       Follow up plan: Return in about 3 months (around 05/11/2016).

## 2016-02-10 NOTE — Assessment & Plan Note (Signed)
Blood pressure markedly elevated patient taking medicines faithfully will increase amlodipine from 5 mg to 10 mg is not having any side effects from meds.

## 2016-02-10 NOTE — Addendum Note (Signed)
Addended byGolden Pop on: 02/10/2016 04:37 PM   Modules accepted: Orders

## 2016-02-10 NOTE — Assessment & Plan Note (Signed)
Chronic back pain now being managed with Tylenol all in all doing okay we will do Flexeril 5 mg twice a day when necessary

## 2016-02-10 NOTE — Assessment & Plan Note (Signed)
Patient still has Xanax refills and cutting in half taking 0.25 mg twice a day discuss further reductions in slow taper. Continue Zoloft same dose

## 2016-02-10 NOTE — Assessment & Plan Note (Signed)
Recheck elevated TSH

## 2016-02-11 LAB — TSH: TSH: 4.38 u[IU]/mL (ref 0.450–4.500)

## 2016-02-12 ENCOUNTER — Encounter: Payer: Self-pay | Admitting: Family Medicine

## 2016-02-29 ENCOUNTER — Encounter (INDEPENDENT_AMBULATORY_CARE_PROVIDER_SITE_OTHER): Payer: Self-pay

## 2016-03-14 ENCOUNTER — Telehealth: Payer: Self-pay | Admitting: Family Medicine

## 2016-03-14 NOTE — Telephone Encounter (Signed)
Routing to provider  

## 2016-03-14 NOTE — Telephone Encounter (Signed)
Call pt 

## 2016-03-14 NOTE — Telephone Encounter (Signed)
Phone call Discussed with patient had syncopal event. Was wondering if amlodipine may be causing syncope. Patient's been taking amlodipine faithfully every day with no problems or change in blood pressure. Doubt that amlodipine is having any effect. On review patient had no postictal symptoms is unaware that she was out for an unknown time but doesn't feel like a long time past. Patient did have a warning that she was getting lightheaded and felt like she better sit down. Because of the symptoms I recommended she be checked out and go to the emergency room.

## 2016-03-15 ENCOUNTER — Encounter: Payer: Self-pay | Admitting: Emergency Medicine

## 2016-03-15 ENCOUNTER — Emergency Department
Admission: EM | Admit: 2016-03-15 | Discharge: 2016-03-15 | Disposition: A | Discharge status: Home / Self Care | Payer: Medicare Other | Attending: Emergency Medicine | Admitting: Emergency Medicine

## 2016-03-15 DIAGNOSIS — R55 Syncope and collapse: Secondary | ICD-10-CM | POA: Diagnosis not present

## 2016-03-15 DIAGNOSIS — Z79899 Other long term (current) drug therapy: Secondary | ICD-10-CM | POA: Insufficient documentation

## 2016-03-15 DIAGNOSIS — F17210 Nicotine dependence, cigarettes, uncomplicated: Secondary | ICD-10-CM | POA: Diagnosis not present

## 2016-03-15 DIAGNOSIS — I129 Hypertensive chronic kidney disease with stage 1 through stage 4 chronic kidney disease, or unspecified chronic kidney disease: Secondary | ICD-10-CM | POA: Diagnosis not present

## 2016-03-15 DIAGNOSIS — N189 Chronic kidney disease, unspecified: Secondary | ICD-10-CM | POA: Insufficient documentation

## 2016-03-15 LAB — CBC
HEMATOCRIT: 43.7 % (ref 35.0–47.0)
HEMOGLOBIN: 15.3 g/dL (ref 12.0–16.0)
MCH: 30.9 pg (ref 26.0–34.0)
MCHC: 35 g/dL (ref 32.0–36.0)
MCV: 88.4 fL (ref 80.0–100.0)
Platelets: 211 10*3/uL (ref 150–440)
RBC: 4.94 MIL/uL (ref 3.80–5.20)
RDW: 13.5 % (ref 11.5–14.5)
WBC: 9.2 10*3/uL (ref 3.6–11.0)

## 2016-03-15 LAB — TROPONIN I: Troponin I: <0.03 ng/mL (ref <0.03)

## 2016-03-15 LAB — URINALYSIS COMPLETE WITH MICROSCOPIC (ARMC ONLY)
Bilirubin Urine: NEGATIVE
Glucose, UA: NEGATIVE mg/dL
KETONES UR: NEGATIVE mg/dL
Leukocytes, UA: NEGATIVE
Nitrite: NEGATIVE
PH: 5 (ref 5.0–8.0)
PROTEIN: 30 mg/dL — AB
Specific Gravity, Urine: 1.019 (ref 1.005–1.030)

## 2016-03-15 LAB — BASIC METABOLIC PANEL
ANION GAP: 9 (ref 5–15)
BUN: 8 mg/dL (ref 6–20)
CHLORIDE: 109 mmol/L (ref 101–111)
CO2: 22 mmol/L (ref 22–32)
Calcium: 9.1 mg/dL (ref 8.9–10.3)
Creatinine, Ser: 0.98 mg/dL (ref 0.44–1.00)
GFR calc Af Amer: >60 mL/min (ref >60)
GLUCOSE: 108 mg/dL — AB (ref 65–99)
POTASSIUM: 3.1 mmol/L — AB (ref 3.5–5.1)
SODIUM: 140 mmol/L (ref 135–145)

## 2016-03-15 NOTE — ED Provider Notes (Signed)
[facility] Emergency Department Provider Note ____________________________________________   I have reviewed the triage vital signs and the triage nursing note.  HISTORY  Chief Complaint Near Syncope   Historian Patient  HPI Caitlyn May is a 60 y.o. female who on Saturday was standing in the bathroom where she felt some lightheadedness and then weakness all over and then passed out. She reports no injury. No headache or confusion or weakness or numbness that was focal. No seizure activity. No recent illnesses. No fever. She doesn't think that she was blacked out for very long. She had a friend at the door. She reports that a few times of a few years ago near her mother's death she had some episodes like this. No palpitations or chest pain either before or after the event or since then.  She states that she wanted to be checked out, but did not have transportation until today. Next I she does see [facility] for yearly physical.    Past Medical History:  Diagnosis Date  . Chronic kidney disease   . Depression   . Failed back syndrome, lumbar   . Hyperlipidemia   . Hypertension   . Osteoporosis   . Weight loss     Patient Active Problem List   Diagnosis Date Noted  . Elevated TSH 07/08/2015  . Acid reflux 11/19/2014  . Depression   . Hyperlipidemia   . Hypertension   . Postlaminectomy syndrome, lumbar region 11/17/2014    Past Surgical History:  Procedure Laterality Date  . ABDOMINAL HYSTERECTOMY    . CESAREAN SECTION    . MOUTH SURGERY    . SPINE SURGERY      Prior to Admission medications   Medication Sig Start Date End Date Taking? Authorizing Provider  ALPRAZolam Duanne Moron) 0.5 MG tablet Take 1 tablet (0.5 mg total) by mouth 2 (two) times daily as needed for anxiety. 10/21/15  Yes Guadalupe Maple, MD  amLODipine (NORVASC) 5 MG tablet Take 5 mg by mouth daily.   Yes Historical Provider, MD  atorvastatin (LIPITOR) 40 MG  tablet Take 1 tablet (40 mg total) by mouth daily. 03/31/15  Yes Guadalupe Maple, MD  benazepril (LOTENSIN) 40 MG tablet Take 1 tablet (40 mg total) by mouth daily. 03/31/15  Yes Guadalupe Maple, MD  cyclobenzaprine (FLEXERIL) 5 MG tablet Take 1 tablet (5 mg total) by mouth 2 (two) times daily as needed for muscle spasms. 02/10/16  Yes Guadalupe Maple, MD  fluticasone (FLONASE) 50 MCG/ACT nasal spray Place 1 spray into both nostrils daily.   Yes Historical Provider, MD  lubiprostone (AMITIZA) 24 MCG capsule Take 1 capsule (24 mcg total) by mouth 2 (two) times daily with a meal. 03/31/15  Yes Guadalupe Maple, MD  metoprolol succinate (TOPROL-XL) 50 MG 24 hr tablet Take 1 tablet (50 mg total) by mouth daily. Take with or immediately following a meal. 03/31/15  Yes Guadalupe Maple, MD  ranitidine (ZANTAC) 75 MG tablet Take 1 tablet (75 mg total) by mouth 2 (two) times daily. 03/31/15  Yes Guadalupe Maple, MD  sertraline (ZOLOFT) 100 MG tablet Take 1 tablet (100 mg total) by mouth daily. 03/31/15  Yes Guadalupe Maple, MD    Allergies  Allergen Reactions  . Accupril [Quinapril Hcl]   . Avelox [Moxifloxacin]   . Ciprofloxacin   . Penicillins   . Sulfa Antibiotics     Family History  Problem Relation Age of Onset  . Hypertension  Mother   . Dementia Mother   . Cancer Father     Social History Social History  Substance Use Topics  . Smoking status: Current Every Day Smoker    Packs/day: 1.00    Types: Cigarettes  . Smokeless tobacco: Never Used  . Alcohol use No    Review of Systems  Constitutional: Negative for fever. Eyes: Negative for visual changes. ENT: Negative for sore throat. Cardiovascular: Negative for chest pain.  Negative for palpitations. Respiratory: Negative for shortness of breath. Gastrointestinal: Negative for abdominal pain, vomiting and diarrhea. Genitourinary: Negative for dysuria. Musculoskeletal: Negative for back pain. Skin: Negative for rash. Neurological:  Negative for headache. 10 point Review of Systems otherwise negative ____________________________________________   PHYSICAL EXAM:  VITAL SIGNS: ED Triage Vitals  Enc Vitals Group     BP 03/15/16 1117 134/81     Pulse Rate 03/15/16 1117 68     Resp 03/15/16 1117 18     Temp 03/15/16 1117 98.7 F (37.1 C)     Temp Source 03/15/16 1117 Oral     SpO2 03/15/16 1117 100 %     Weight 03/15/16 1118 128 lb (58.1 kg)     Height 03/15/16 1118 5\' 7"  (1.702 m)     Head Circumference --      Peak Flow --      Pain Score 03/15/16 1118 5     Pain Loc --      Pain Edu? --      Excl. in [place]? --      Constitutional: Alert and oriented. Well appearing and in no distress. HEENT   Head: Normocephalic and atraumatic.      Eyes: Conjunctivae are normal. PERRL. Normal extraocular movements.      Ears:         Nose: No congestion/rhinnorhea.   Mouth/Throat: Mucous membranes are moist.   Neck: No stridor. Cardiovascular/Chest: Normal rate, regular rhythm.  No murmurs, rubs, or gallops. Respiratory: Normal respiratory effort without tachypnea nor retractions. Breath sounds are clear and equal bilaterally. No wheezes/rales/rhonchi. Gastrointestinal: Soft. No distention, no guarding, no rebound. Nontender.    Genitourinary/rectal:Deferred Musculoskeletal: Nontender with normal range of motion in all extremities. No joint effusions.  No lower extremity tenderness.  No edema. Neurologic:  Normal speech and language. No gross or focal neurologic deficits are appreciated. Skin:  Skin is warm, dry and intact. No rash noted. Psychiatric: Mood and affect are normal. Speech and behavior are normal. Patient exhibits appropriate insight and judgment.   ____________________________________________  LABS (pertinent positives/negatives)  Labs Reviewed  BASIC METABOLIC PANEL - Abnormal; Notable for the following:       Result Value   Potassium 3.1 (*)    Glucose, Bld 108 (*)    All other  components within normal limits  URINALYSIS COMPLETEWITH MICROSCOPIC ([facility] ONLY) - Abnormal; Notable for the following:    Color, Urine YELLOW (*)    APPearance HAZY (*)    Hgb urine dipstick 1+ (*)    Protein, ur 30 (*)    Bacteria, UA RARE (*)    Squamous Epithelial / LPF 0-5 (*)    All other components within normal limits  CBC  TROPONIN I  CBG MONITORING, ED    ____________________________________________    EKG I, Lisa Roca, MD, the attending physician have personally viewed and interpreted all ECGs.  60 bpm. Normal sinus rhythm with sinus arrhythmia. Narrow QRS. Normal axis. Normal ST and T-wave ____________________________________________  RADIOLOGY All Xrays were viewed by  me. Imaging interpreted by Radiologist.  None __________________________________________  PROCEDURES  Procedure(s) performed: None  Critical Care performed: None  ____________________________________________   ED COURSE / ASSESSMENT AND PLAN  Pertinent labs & imaging results that were available during my care of the patient were reviewed by me and considered in my medical decision making (see chart for details).   Ms. Hynek is here after a syncopal episode 3 days ago, Saturday.  No high risk/red flag features concerning for acute cardiac or neurologic or other medical emergency based on historical features or physical exam today.  Laboratory studies are reassuring. Her EKG is reassuring.  I discussed with her possible vagal event,  But given her age, I have recommended that she follow with cardiology as well as primary care doctor.    CONSULTATIONS:   none   Patient / Family / Caregiver informed of clinical course, medical decision-making process, and agree with plan.   I discussed return precautions, follow-up instructions, and discharge instructions with patient and/or family.   ___________________________________________   FINAL CLINICAL IMPRESSION(S) / ED  DIAGNOSES   Final diagnoses:  Syncope, unspecified syncope type              Note: This dictation was prepared with Dragon dictation. Any transcriptional errors that result from this process are unintentional    Lisa Roca, MD 03/15/16 1606

## 2016-03-15 NOTE — ED Notes (Signed)
Pt reports she felt dizzy while walking to the bathroom Saturday, states she lost consciousness, when she awoke she took her BP and found in to be in the 70s. States she has been unable to come to hospital to be evaluated until today

## 2016-03-15 NOTE — Discharge Instructions (Signed)
You were evaluated after passing out a few days ago, and your exam and evaluation are reassuring in the emergency department today. As we discussed, please return to the emergency department immediately for any worsening symptoms including any headache, confusion, altered mental status, weakness or numbness, vision changes, slurred speech, heart racing, chest pain, trouble breathing, or additional passing out episode.  I'm recommending that he follow up with a cardiologist this week, call the office in the morning for an appointment. Please also follow up with your primary care doctor.

## 2016-03-15 NOTE — ED Triage Notes (Signed)
Pt to ed with c/o syncopal episode this am today at home while alone.  Pt states she awoke on the floor of bathroom.  Pt denies headache, denies injury to head although she states she can't be sure she did not hit her head.  Pt unsure of time for loss of consciousness.

## 2016-04-06 ENCOUNTER — Encounter: Payer: Self-pay | Admitting: Family Medicine

## 2016-04-06 ENCOUNTER — Ambulatory Visit (INDEPENDENT_AMBULATORY_CARE_PROVIDER_SITE_OTHER): Payer: Medicare Other | Admitting: Family Medicine

## 2016-04-06 VITALS — BP 128/68 | HR 71 | Temp 98.3°F | Ht 65.5 in | Wt 130.0 lb

## 2016-04-06 DIAGNOSIS — I10 Essential (primary) hypertension: Secondary | ICD-10-CM | POA: Diagnosis not present

## 2016-04-06 DIAGNOSIS — Z1231 Encounter for screening mammogram for malignant neoplasm of breast: Secondary | ICD-10-CM

## 2016-04-06 DIAGNOSIS — F3342 Major depressive disorder, recurrent, in full remission: Secondary | ICD-10-CM | POA: Diagnosis not present

## 2016-04-06 DIAGNOSIS — Z780 Asymptomatic menopausal state: Secondary | ICD-10-CM

## 2016-04-06 DIAGNOSIS — Z0000 Encounter for general adult medical examination without abnormal findings: Secondary | ICD-10-CM

## 2016-04-06 DIAGNOSIS — E782 Mixed hyperlipidemia: Secondary | ICD-10-CM | POA: Diagnosis not present

## 2016-04-06 DIAGNOSIS — Z1211 Encounter for screening for malignant neoplasm of colon: Secondary | ICD-10-CM

## 2016-04-06 DIAGNOSIS — Z114 Encounter for screening for human immunodeficiency virus [HIV]: Secondary | ICD-10-CM

## 2016-04-06 DIAGNOSIS — N183 Chronic kidney disease, stage 3 unspecified: Secondary | ICD-10-CM

## 2016-04-06 DIAGNOSIS — R946 Abnormal results of thyroid function studies: Secondary | ICD-10-CM | POA: Diagnosis not present

## 2016-04-06 DIAGNOSIS — M961 Postlaminectomy syndrome, not elsewhere classified: Secondary | ICD-10-CM

## 2016-04-06 DIAGNOSIS — R7989 Other specified abnormal findings of blood chemistry: Secondary | ICD-10-CM

## 2016-04-06 LAB — URINALYSIS, ROUTINE W REFLEX MICROSCOPIC
Bilirubin, UA: NEGATIVE
Glucose, UA: NEGATIVE
KETONES UA: NEGATIVE
LEUKOCYTES UA: NEGATIVE
NITRITE UA: NEGATIVE
PH UA: 6 (ref 5.0–7.5)
Protein, UA: NEGATIVE
Specific Gravity, UA: <1.005 — ABNORMAL LOW (ref 1.005–1.030)
Urobilinogen, Ur: 0.2 mg/dL (ref 0.2–1.0)

## 2016-04-06 LAB — MICROSCOPIC EXAMINATION

## 2016-04-06 MED ORDER — AMLODIPINE BESYLATE 10 MG PO TABS: 10.0000 mg | ORAL_TABLET | Freq: Every day | ORAL | 12 refills | Status: DC

## 2016-04-06 MED ORDER — SERTRALINE HCL 100 MG PO TABS: 100.0000 mg | ORAL_TABLET | Freq: Every day | ORAL | 12 refills | Status: DC

## 2016-04-06 MED ORDER — METOPROLOL SUCCINATE ER 50 MG PO TB24: 50.0000 mg | ORAL_TABLET | Freq: Every day | ORAL | 12 refills | Status: DC

## 2016-04-06 MED ORDER — BENAZEPRIL HCL 40 MG PO TABS: 40.0000 mg | ORAL_TABLET | Freq: Every day | ORAL | 12 refills | Status: DC

## 2016-04-06 MED ORDER — ATORVASTATIN CALCIUM 40 MG PO TABS: 40.0000 mg | ORAL_TABLET | Freq: Every day | ORAL | 12 refills | Status: DC

## 2016-04-06 NOTE — Assessment & Plan Note (Signed)
stable °

## 2016-04-06 NOTE — Progress Notes (Signed)
BP 128/68 (BP Location: Left Arm)   Pulse 71   Temp 98.3 F (36.8 C)   Ht 5' 5.5" (1.664 m)   Wt 130 lb (59 kg)   SpO2 99%   BMI 21.30 kg/m    Subjective:    Patient ID: Caitlyn May, female    DOB: 1956/01/22, 60 y.o.   MRN: [id number]  HPI: Caitlyn May is a 60 y.o. female  Chief Complaint  Patient presents with  . Annual Exam  . Referral    was seen in ED for syncope and was referred to Cardiology. She needs to see a different cardiologist due to an outstanding bill.  Blood pressure doing well with increase medications no complaints taking medications faithfully.  Reviewed syncopal attack with patient again patient's workup at the emergency room was entirely negative. Patient relates she has had these spells before only in relationship to immediate family member death.'s time happened when going over to father's empty house to help clean it out after his death. Of course this is very stressful time.  Back is stable taking over-the-counter ibuprofen which is okay. Depression doing stable on Zoloft 100 mg Cholesterol doing stable on atorvastatin Taking all meds faithfully without side effects. Flexeril also doing okay and taking that from time to time.  Relevant past medical, surgical, family and social history reviewed and updated as indicated. Interim medical history since our last visit reviewed. Allergies and medications reviewed and updated.  Review of Systems  Constitutional: Negative.   HENT: Negative.   Eyes: Negative.   Respiratory: Negative.   Cardiovascular: Negative.   Gastrointestinal: Negative.   Endocrine: Negative.   Genitourinary: Negative.   Musculoskeletal: Negative.   Skin: Negative.   Allergic/Immunologic: Negative.   Neurological: Negative.   Hematological: Negative.   Psychiatric/Behavioral: Negative.     Per HPI unless specifically indicated above     Objective:    BP 128/68 (BP Location: Left Arm)   Pulse 71   Temp  98.3 F (36.8 C)   Ht 5' 5.5" (1.664 m)   Wt 130 lb (59 kg)   SpO2 99%   BMI 21.30 kg/m   Wt Readings from Last 3 Encounters:  04/06/16 130 lb (59 kg)  03/15/16 128 lb (58.1 kg)  02/10/16 128 lb (58.1 kg)    Physical Exam  Constitutional: She is oriented to person, place, and time. She appears well-developed and well-nourished.  HENT:  Head: Normocephalic and atraumatic.  Right Ear: External ear normal.  Left Ear: External ear normal.  Nose: Nose normal.  Mouth/Throat: Oropharynx is clear and moist.  Eyes: Conjunctivae and EOM are normal. Pupils are equal, round, and reactive to light.  Neck: Normal range of motion. Neck supple. Carotid bruit is not present.  Cardiovascular: Normal rate, regular rhythm and normal heart sounds.   No murmur heard. Pulmonary/Chest: Effort normal and breath sounds normal. She exhibits no mass. Right breast exhibits no mass, no skin change and no tenderness. Left breast exhibits no mass, no skin change and no tenderness. Breasts are symmetrical.  Abdominal: Soft. Bowel sounds are normal. There is no hepatosplenomegaly.  Musculoskeletal: Normal range of motion.  Neurological: She is alert and oriented to person, place, and time.  Skin: No rash noted.  Psychiatric: She has a normal mood and affect. Her behavior is normal. Judgment and thought content normal.    Results for orders placed or performed during the hospital encounter of [id number]  Basic metabolic panel  Result Value Ref  Range   Sodium 140 135 - 145 mmol/L   Potassium 3.1 (L) 3.5 - 5.1 mmol/L   Chloride 109 101 - 111 mmol/L   CO2 22 22 - 32 mmol/L   Glucose, Bld 108 (H) 65 - 99 mg/dL   BUN 8 6 - 20 mg/dL   Creatinine, Ser 0.98 0.44 - 1.00 mg/dL   Calcium 9.1 8.9 - 10.3 mg/dL   GFR calc non Af Amer >60 >60 mL/min   GFR calc Af Amer >60 >60 mL/min   Anion gap 9 5 - 15  CBC  Result Value Ref Range   WBC 9.2 3.6 - 11.0 K/uL   RBC 4.94 3.80 - 5.20 MIL/uL   Hemoglobin 15.3 12.0 - 16.0  g/dL   HCT 43.7 35.0 - 47.0 %   MCV 88.4 80.0 - 100.0 fL   MCH 30.9 26.0 - 34.0 pg   MCHC 35.0 32.0 - 36.0 g/dL   RDW 13.5 11.5 - 14.5 %   Platelets 211 150 - 440 K/uL  Urinalysis complete, with microscopic  Result Value Ref Range   Color, Urine YELLOW (A) YELLOW   APPearance HAZY (A) CLEAR   Glucose, UA NEGATIVE NEGATIVE mg/dL   Bilirubin Urine NEGATIVE NEGATIVE   Ketones, ur NEGATIVE NEGATIVE mg/dL   Specific Gravity, Urine 1.019 1.005 - 1.030   Hgb urine dipstick 1+ (A) NEGATIVE   pH 5.0 5.0 - 8.0   Protein, ur 30 (A) NEGATIVE mg/dL   Nitrite NEGATIVE NEGATIVE   Leukocytes, UA NEGATIVE NEGATIVE   RBC / HPF 0-5 0 - 5 RBC/hpf   WBC, UA 0-5 0 - 5 WBC/hpf   Bacteria, UA RARE (A) NONE SEEN   Squamous Epithelial / LPF 0-5 (A) NONE SEEN   Mucous PRESENT    Hyphae Yeast PRESENT    Ca Oxalate Crys, UA PRESENT   Troponin I  Result Value Ref Range   Troponin I <0.03 <0.03 ng/mL      Assessment & Plan:   Problem List Items Addressed This Visit      Cardiovascular and Mediastinum   Hypertension - Primary    The current medical regimen is effective;  continue present plan and medications.       Relevant Medications   amLODipine (NORVASC) 10 MG tablet   atorvastatin (LIPITOR) 40 MG tablet   benazepril (LOTENSIN) 40 MG tablet   metoprolol succinate (TOPROL-XL) 50 MG 24 hr tablet   Other Relevant Orders   CBC with Differential/Platelet   Comprehensive metabolic panel     Other   Postlaminectomy syndrome, lumbar region    stable      Depression    The current medical regimen is effective;  continue present plan and medications.       Relevant Medications   sertraline (ZOLOFT) 100 MG tablet   Hyperlipidemia    The current medical regimen is effective;  continue present plan and medications.       Relevant Medications   amLODipine (NORVASC) 10 MG tablet   atorvastatin (LIPITOR) 40 MG tablet   benazepril (LOTENSIN) 40 MG tablet   metoprolol succinate (TOPROL-XL)  50 MG 24 hr tablet   Other Relevant Orders   Lipid Profile   Elevated TSH   Relevant Orders   TSH    Other Visit Diagnoses    CKD (chronic kidney disease) stage 3, GFR 30-59 ml/min       Relevant Orders   Comprehensive metabolic panel   Urinalysis, Routine w reflex microscopic (not at  [place])   PE (physical exam), annual       Relevant Orders   CBC with Differential/Platelet   Comprehensive metabolic panel   Lipid Profile   TSH   Urinalysis, Routine w reflex microscopic (not at [facility])   HIV antibody (with reflex)   DG Bone Density   Screening mammogram, encounter for       Relevant Orders   MM Digital Screening   Screening for HIV (human immunodeficiency virus)       Relevant Orders   HIV antibody (with reflex)   Colon cancer screening       Relevant Orders   Ambulatory referral to Gastroenterology   Post-menopausal       Relevant Orders   DG Bone Density       Follow up plan: Return in about 6 months (around 10/04/2016) for BMP,  Lipids, ALT, AST.

## 2016-04-06 NOTE — Assessment & Plan Note (Signed)
The current medical regimen is effective;  continue present plan and medications.  

## 2016-04-07 ENCOUNTER — Encounter: Payer: Self-pay | Admitting: Family Medicine

## 2016-04-07 LAB — COMPREHENSIVE METABOLIC PANEL
ALT: 19 IU/L (ref 0–32)
AST: 15 IU/L (ref 0–40)
Albumin/Globulin Ratio: 1.6 (ref 1.2–2.2)
Albumin: 4.4 g/dL (ref 3.6–4.8)
Alkaline Phosphatase: 102 IU/L (ref 39–117)
BUN/Creatinine Ratio: 12 (ref 12–28)
BUN: 11 mg/dL (ref 8–27)
Bilirubin Total: 0.2 mg/dL (ref 0.0–1.2)
CALCIUM: 9 mg/dL (ref 8.7–10.3)
CO2: 20 mmol/L (ref 18–29)
CREATININE: 0.95 mg/dL (ref 0.57–1.00)
Chloride: 105 mmol/L (ref 96–106)
GFR calc Af Amer: 75 mL/min/{1.73_m2} (ref >59)
GFR, EST NON AFRICAN AMERICAN: 65 mL/min/{1.73_m2} (ref >59)
GLUCOSE: 92 mg/dL (ref 65–99)
Globulin, Total: 2.7 g/dL (ref 1.5–4.5)
POTASSIUM: 4 mmol/L (ref 3.5–5.2)
Sodium: 144 mmol/L (ref 134–144)
TOTAL PROTEIN: 7.1 g/dL (ref 6.0–8.5)

## 2016-04-07 LAB — CBC WITH DIFFERENTIAL/PLATELET
Basophils Absolute: 0 10*3/uL (ref 0.0–0.2)
Basos: 0 %
EOS (ABSOLUTE): 0.1 10*3/uL (ref 0.0–0.4)
Eos: 1 %
Hematocrit: 41.9 % (ref 34.0–46.6)
Hemoglobin: 14.3 g/dL (ref 11.1–15.9)
IMMATURE GRANS (ABS): 0 10*3/uL (ref 0.0–0.1)
IMMATURE GRANULOCYTES: 0 %
LYMPHS: 33 %
Lymphocytes Absolute: 2.6 10*3/uL (ref 0.7–3.1)
MCH: 30.6 pg (ref 26.6–33.0)
MCHC: 34.1 g/dL (ref 31.5–35.7)
MCV: 90 fL (ref 79–97)
MONOS ABS: 0.6 10*3/uL (ref 0.1–0.9)
Monocytes: 8 %
NEUTROS PCT: 58 %
Neutrophils Absolute: 4.4 10*3/uL (ref 1.4–7.0)
PLATELETS: 315 10*3/uL (ref 150–379)
RBC: 4.67 x10E6/uL (ref 3.77–5.28)
RDW: 13.7 % (ref 12.3–15.4)
WBC: 7.7 10*3/uL (ref 3.4–10.8)

## 2016-04-07 LAB — LIPID PANEL
CHOL/HDL RATIO: 3 ratio (ref 0.0–4.4)
Cholesterol, Total: 155 mg/dL (ref 100–199)
HDL: 52 mg/dL (ref >39)
LDL CALC: 85 mg/dL (ref 0–99)
TRIGLYCERIDES: 89 mg/dL (ref 0–149)
VLDL CHOLESTEROL CAL: 18 mg/dL (ref 5–40)

## 2016-04-07 LAB — TSH: TSH: 1.98 u[IU]/mL (ref 0.450–4.500)

## 2016-04-07 LAB — HIV ANTIBODY (ROUTINE TESTING W REFLEX): HIV Screen 4th Generation wRfx: NONREACTIVE

## 2016-04-08 ENCOUNTER — Other Ambulatory Visit: Payer: Self-pay

## 2016-04-08 ENCOUNTER — Telehealth: Payer: Self-pay

## 2016-04-08 NOTE — Telephone Encounter (Signed)
Gastroenterology Pre-Procedure Review  Request Date: 05/05/2016 Requesting Physician: Dr. Jeananne Rama  PATIENT REVIEW QUESTIONS: The patient responded to the following health history questions as indicated:    1. Are you having any GI issues? no 2. Do you have a personal history of Polyps? no 3. Do you have a family history of Colon Cancer or Polyps? yes (mother benign polyps) 4. Diabetes Mellitus? no 5. Joint replacements in the past 12 months?no 6. Major health problems in the past 3 months?no 7. Any artificial heart valves, MVP, or defibrillator?no    MEDICATIONS & ALLERGIES:    Patient reports the following regarding taking any anticoagulation/antiplatelet therapy:   Plavix, Coumadin, Eliquis, Xarelto, Lovenox, Pradaxa, Brilinta, or Effient? no Aspirin? NO  Patient confirms/reports the following medications:  Current Outpatient Prescriptions  Medication Sig Dispense Refill  . ALPRAZolam (XANAX) 0.5 MG tablet Take 1 tablet (0.5 mg total) by mouth 2 (two) times daily as needed for anxiety. 50 tablet 3  . amLODipine (NORVASC) 10 MG tablet Take 1 tablet (10 mg total) by mouth daily. 30 tablet 12  . atorvastatin (LIPITOR) 40 MG tablet Take 1 tablet (40 mg total) by mouth daily. 30 tablet 12  . benazepril (LOTENSIN) 40 MG tablet Take 1 tablet (40 mg total) by mouth daily. 30 tablet 12  . cyclobenzaprine (FLEXERIL) 5 MG tablet Take 1 tablet (5 mg total) by mouth 2 (two) times daily as needed for muscle spasms. 30 tablet 2  . fluticasone (FLONASE) 50 MCG/ACT nasal spray Place 1 spray into both nostrils daily.    . metoprolol succinate (TOPROL-XL) 50 MG 24 hr tablet Take 1 tablet (50 mg total) by mouth daily. Take with or immediately following a meal. 30 tablet 12  . ranitidine (ZANTAC) 75 MG tablet Take 1 tablet (75 mg total) by mouth 2 (two) times daily. 60 tablet 12  . sertraline (ZOLOFT) 100 MG tablet Take 1 tablet (100 mg total) by mouth daily. 30 tablet 12   No current  facility-administered medications for this visit.     Patient confirms/reports the following allergies:  Allergies  Allergen Reactions  . Accupril [Quinapril Hcl]   . Avelox [Moxifloxacin]   . Ciprofloxacin   . Penicillins   . Sulfa Antibiotics     No orders of the defined types were placed in this encounter.   AUTHORIZATION INFORMATION Primary Insurance: 1D#: Group #:  Secondary Insurance: 1D#: Group #:  SCHEDULE INFORMATION: Date: 05/05/2016 Time: Location: [facility]

## 2016-04-08 NOTE — Telephone Encounter (Signed)
Screening Colonoscopy Z12.11 [facility] 05/05/2016 Dr. Vicente Males Medicaid/Medicare Pre cert is not required

## 2016-04-18 ENCOUNTER — Other Ambulatory Visit: Payer: Self-pay | Admitting: Family Medicine

## 2016-04-18 DIAGNOSIS — M961 Postlaminectomy syndrome, not elsewhere classified: Secondary | ICD-10-CM

## 2016-04-18 NOTE — Telephone Encounter (Signed)
Routing to provider  

## 2016-05-05 ENCOUNTER — Ambulatory Visit: Admission: RE | Admit: 2016-05-05 | Payer: Medicare Other | Source: Ambulatory Visit | Admitting: Gastroenterology

## 2016-05-05 ENCOUNTER — Encounter: Admission: RE | Payer: Self-pay | Source: Ambulatory Visit

## 2016-05-05 SURGERY — COLONOSCOPY WITH PROPOFOL
Anesthesia: General

## 2016-06-13 ENCOUNTER — Ambulatory Visit (INDEPENDENT_AMBULATORY_CARE_PROVIDER_SITE_OTHER): Payer: Medicare Other | Admitting: Family Medicine

## 2016-06-13 ENCOUNTER — Encounter: Payer: Self-pay | Admitting: Family Medicine

## 2016-06-13 VITALS — BP 156/90 | HR 81 | Temp 98.6°F | Wt 132.2 lb

## 2016-06-13 DIAGNOSIS — J0100 Acute maxillary sinusitis, unspecified: Secondary | ICD-10-CM

## 2016-06-13 DIAGNOSIS — H5711 Ocular pain, right eye: Secondary | ICD-10-CM | POA: Diagnosis not present

## 2016-06-13 MED ORDER — DOXYCYCLINE MONOHYDRATE 100 MG PO TABS: 100.0000 mg | ORAL_TABLET | Freq: Two times a day (BID) | ORAL | 0 refills | Status: DC

## 2016-06-13 NOTE — Progress Notes (Signed)
BP (!) 156/90 (BP Location: Right Arm, Patient Position: Sitting, Cuff Size: Small)   Pulse 81   Temp 98.6 F (37 C)   Wt 132 lb 3.2 oz (60 kg)   SpO2 99%   BMI 21.66 kg/m    Subjective:    Patient ID: Caitlyn May, female    DOB: [birth year], 61 y.o.   MRN: [id number]  HPI: Caitlyn May is a 61 y.o. female  Chief Complaint  Patient presents with  . Nasal Congestion   UPPER RESPIRATORY TRACT INFECTION Duration: about 3 weeks Worst symptom: congestion Fever: no Cough: yes Shortness of breath: yes Wheezing: no Chest pain: no Chest tightness: no Chest congestion: yes Nasal congestion: yes Runny nose: yes Post nasal drip: yes Sneezing: yes Sore throat: no Swollen glands: no Sinus pressure: no Headache: yes Face pain: no Toothache: no Ear pain: no  Ear pressure: no  Eyes red/itching:yes Eye drainage/crusting: no  Vomiting: no Rash: no Fatigue: yes- has been weaned off her medicine, hard for her to tell if it's being off the pain medicine or if she's been sick Sick contacts: no Strep contacts: no  Context: worse Recurrent sinusitis: no Relief with OTC cold/cough medications: no   EYE PAIN Duration:  About a year Involved eye:  right Onset: gradual Severity: mild  Quality: dull Foreign body sensation:yes Visual impairment: yes Eye redness: no Discharge: no Crusting or matting of eyelids: no Swelling: yes- just in the AM and with rubbing it Photophobia: yes Itching: yes Tearing: no Headache: yes Floaters: yes URI symptoms: yes Contact lens use: no Close contacts with similar problems: no Eye trauma: no Aggravating factors: nothing Alleviating factors: lubricant Status: worse Treatments attempted: lubricant  Relevant past medical, surgical, family and social history reviewed and updated as indicated. Interim medical history since our last visit reviewed. Allergies and medications reviewed and updated.  Review of Systems    Constitutional: Positive for fatigue. Negative for activity change, appetite change, chills, diaphoresis, fever and unexpected weight change.  HENT: Positive for congestion, postnasal drip, rhinorrhea, sinus pressure and sneezing. Negative for dental problem, drooling, ear discharge, ear pain, facial swelling, hearing loss, mouth sores, nosebleeds, sinus pain, sore throat, tinnitus, trouble swallowing and voice change.   Eyes: Positive for photophobia, pain, itching and visual disturbance. Negative for discharge and redness.  Respiratory: Positive for cough, shortness of breath and wheezing. Negative for apnea, choking, chest tightness and stridor.   Cardiovascular: Negative.   Psychiatric/Behavioral: Negative.     Per HPI unless specifically indicated above     Objective:    BP (!) 156/90 (BP Location: Right Arm, Patient Position: Sitting, Cuff Size: Small)   Pulse 81   Temp 98.6 F (37 C)   Wt 132 lb 3.2 oz (60 kg)   SpO2 99%   BMI 21.66 kg/m   Wt Readings from Last 3 Encounters:  06/13/16 132 lb 3.2 oz (60 kg)  04/06/16 130 lb (59 kg)  03/15/16 128 lb (58.1 kg)     Visual Acuity Screening   Right eye Left eye Both eyes  Without correction: 20/30 20/70   With correction:       Physical Exam  Constitutional: She is oriented to person, place, and time. She appears well-developed and well-nourished. No distress.  HENT:  Head: Normocephalic and atraumatic.  Right Ear: Hearing, tympanic membrane, external ear and ear canal normal.  Left Ear: Hearing, tympanic membrane, external ear and ear canal normal.  Nose: Mucosal edema, rhinorrhea and sinus  tenderness present. Right sinus exhibits maxillary sinus tenderness. Right sinus exhibits no frontal sinus tenderness. Left sinus exhibits no maxillary sinus tenderness and no frontal sinus tenderness.  Mouth/Throat: Uvula is midline, oropharynx is clear and moist and mucous membranes are normal. No oropharyngeal exudate.  Eyes:  Conjunctivae, EOM and lids are normal. Pupils are equal, round, and reactive to light. Right eye exhibits no discharge. Left eye exhibits no discharge. No scleral icterus.  Neck: Normal range of motion. Neck supple. No JVD present. No tracheal deviation present. No thyromegaly present.  Cardiovascular: Normal rate, regular rhythm, normal heart sounds and intact distal pulses.  Exam reveals no gallop and no friction rub.   No murmur heard. Pulmonary/Chest: Effort normal and breath sounds normal. No stridor. No respiratory distress. She has no wheezes. She has no rales. She exhibits no tenderness.  Musculoskeletal: Normal range of motion.  Lymphadenopathy:    She has no cervical adenopathy.  Neurological: She is alert and oriented to person, place, and time.  Skin: Skin is intact. No rash noted. She is not diaphoretic.  Psychiatric: She has a normal mood and affect. Her speech is normal and behavior is normal. Judgment and thought content normal. Cognition and memory are normal.  Nursing note and vitals reviewed.   Results for orders placed or performed in visit on 04/06/16  Microscopic Examination  Result Value Ref Range   WBC, UA 0-5 0 - 5 /hpf   RBC, UA 3-10 (A) 0 - 2 /hpf   Epithelial Cells (non renal) 0-10 0 - 10 /hpf   Bacteria, UA Few (A) None seen/Few  CBC with Differential/Platelet  Result Value Ref Range   WBC 7.7 3.4 - 10.8 x10E3/uL   RBC 4.67 3.77 - 5.28 x10E6/uL   Hemoglobin 14.3 11.1 - 15.9 g/dL   Hematocrit 41.9 34.0 - 46.6 %   MCV 90 79 - 97 fL   MCH 30.6 26.6 - 33.0 pg   MCHC 34.1 31.5 - 35.7 g/dL   RDW 13.7 12.3 - 15.4 %   Platelets 315 150 - 379 x10E3/uL   Neutrophils 58 Not Estab. %   Lymphs 33 Not Estab. %   Monocytes 8 Not Estab. %   Eos 1 Not Estab. %   Basos 0 Not Estab. %   Neutrophils Absolute 4.4 1.4 - 7.0 x10E3/uL   Lymphocytes Absolute 2.6 0.7 - 3.1 x10E3/uL   Monocytes Absolute 0.6 0.1 - 0.9 x10E3/uL   EOS (ABSOLUTE) 0.1 0.0 - 0.4 x10E3/uL    Basophils Absolute 0.0 0.0 - 0.2 x10E3/uL   Immature Granulocytes 0 Not Estab. %   Immature Grans (Abs) 0.0 0.0 - 0.1 x10E3/uL  Comprehensive metabolic panel  Result Value Ref Range   Glucose 92 65 - 99 mg/dL   BUN 11 8 - 27 mg/dL   Creatinine, Ser 0.95 0.57 - 1.00 mg/dL   GFR calc non Af Amer 65 >59 mL/min/1.73   GFR calc Af Amer 75 >59 mL/min/1.73   BUN/Creatinine Ratio 12 12 - 28   Sodium 144 134 - 144 mmol/L   Potassium 4.0 3.5 - 5.2 mmol/L   Chloride 105 96 - 106 mmol/L   CO2 20 18 - 29 mmol/L   Calcium 9.0 8.7 - 10.3 mg/dL   Total Protein 7.1 6.0 - 8.5 g/dL   Albumin 4.4 3.6 - 4.8 g/dL   Globulin, Total 2.7 1.5 - 4.5 g/dL   Albumin/Globulin Ratio 1.6 1.2 - 2.2   Bilirubin Total 0.2 0.0 - 1.2 mg/dL  Alkaline Phosphatase 102 39 - 117 IU/L   AST 15 0 - 40 IU/L   ALT 19 0 - 32 IU/L  Lipid Profile  Result Value Ref Range   Cholesterol, Total 155 100 - 199 mg/dL   Triglycerides 89 0 - 149 mg/dL   HDL 52 >39 mg/dL   VLDL Cholesterol Cal 18 5 - 40 mg/dL   LDL Calculated 85 0 - 99 mg/dL   Chol/HDL Ratio 3.0 0.0 - 4.4 ratio units  TSH  Result Value Ref Range   TSH 1.980 0.450 - 4.500 uIU/mL  Urinalysis, Routine w reflex microscopic (not at [facility])  Result Value Ref Range   Specific Gravity, UA <1.005 (L) 1.005 - 1.030   pH, UA 6.0 5.0 - 7.5   Color, UA Yellow Yellow   Appearance Ur Clear Clear   Leukocytes, UA Negative Negative   Protein, UA Negative Negative/Trace   Glucose, UA Negative Negative   Ketones, UA Negative Negative   RBC, UA 2+ (A) Negative   Bilirubin, UA Negative Negative   Urobilinogen, Ur 0.2 0.2 - 1.0 mg/dL   Nitrite, UA Negative Negative   Microscopic Examination See below:   HIV antibody (with reflex)  Result Value Ref Range   HIV Screen 4th Generation wRfx Non Reactive Non Reactive      Assessment & Plan:   Problem List Items Addressed This Visit    None    Visit Diagnoses    Eye pain, right    -  Primary   Will get her into see eye  doctor. Referral generated today. Decreased vision on exam today.   Relevant Orders   Ambulatory referral to Ophthalmology   Acute non-recurrent maxillary sinusitis       Will treat with doxycycline. Call with with any concerns.   Relevant Medications   doxycycline (ADOXA) 100 MG tablet       Follow up plan: Return As scheduled with Dr. Jeananne Rama.

## 2016-06-17 ENCOUNTER — Telehealth: Payer: Self-pay | Admitting: Family Medicine

## 2016-06-17 NOTE — Telephone Encounter (Signed)
Caitlyn May from [facility] received the referral but wanted you to be aware they have tried to reach the patient leaving her messages and she has not returned the call.  Thanks Santiago Glad

## 2016-06-17 NOTE — Telephone Encounter (Signed)
FYI

## 2016-06-20 NOTE — Telephone Encounter (Signed)
Error

## 2016-06-27 ENCOUNTER — Ambulatory Visit (INDEPENDENT_AMBULATORY_CARE_PROVIDER_SITE_OTHER): Payer: Medicare Other | Admitting: Family Medicine

## 2016-06-27 VITALS — BP 122/75 | HR 68 | Ht 67.0 in | Wt 122.0 lb

## 2016-06-27 DIAGNOSIS — L820 Inflamed seborrheic keratosis: Secondary | ICD-10-CM

## 2016-06-27 DIAGNOSIS — L989 Disorder of the skin and subcutaneous tissue, unspecified: Secondary | ICD-10-CM

## 2016-06-27 DIAGNOSIS — L821 Other seborrheic keratosis: Secondary | ICD-10-CM | POA: Diagnosis not present

## 2016-06-27 NOTE — Progress Notes (Signed)
BP 122/75   Pulse 68   Ht 5\' 7"  (1.702 m)   Wt 122 lb (55.3 kg)   BMI 19.11 kg/m    Subjective:    Patient ID: Caitlyn May, female    DOB: [birth year], 61 y.o.   MRN: [id number]  HPI: Caitlyn May is a 61 y.o. female  Chief Complaint  Patient presents with  . Nevus   Patient with enlarging skin tag bottom of right eye adjacent to her nose. Becoming bothersome will occasionally scratching bleed. Has been ongoing for 6+ months. Informed consent obtained. Relevant past medical, surgical, family and social history reviewed and updated as indicated. Interim medical history since our last visit reviewed. Allergies and medications reviewed and updated.  Review of Systems  Per HPI unless specifically indicated above     Objective:    BP 122/75   Pulse 68   Ht 5\' 7"  (1.702 m)   Wt 122 lb (55.3 kg)   BMI 19.11 kg/m   Wt Readings from Last 3 Encounters:  06/27/16 122 lb (55.3 kg)  06/13/16 132 lb 3.2 oz (60 kg)  04/06/16 130 lb (59 kg)    Physical Exam  Constitutional: She is oriented to person, place, and time. She appears well-developed and well-nourished. No distress.  HENT:  Head: Normocephalic and atraumatic.  Right Ear: Hearing normal.  Left Ear: Hearing normal.  Nose: Nose normal.  Eyes: Conjunctivae and lids are normal. Right eye exhibits no discharge. Left eye exhibits no discharge. No scleral icterus.  Pulmonary/Chest: Effort normal. No respiratory distress.  Musculoskeletal: Normal range of motion.  Neurological: She is alert and oriented to person, place, and time.  Skin: Skin is intact. No rash noted.  Procedure note skin tag approximate 4 mm right eye nose area prepped with Betadine and alcohol and infiltrated with lidocaine with epinephrine using Surgitron with loop lesion removed sent for pathology. Patient tolerated the procedure well based destroyed with Surgitron unit  Psychiatric: She has a normal mood and affect. Her speech is normal and  behavior is normal. Judgment and thought content normal. Cognition and memory are normal.    Results for orders placed or performed in visit on 04/06/16  Microscopic Examination  Result Value Ref Range   WBC, UA 0-5 0 - 5 /hpf   RBC, UA 3-10 (A) 0 - 2 /hpf   Epithelial Cells (non renal) 0-10 0 - 10 /hpf   Bacteria, UA Few (A) None seen/Few  CBC with Differential/Platelet  Result Value Ref Range   WBC 7.7 3.4 - 10.8 x10E3/uL   RBC 4.67 3.77 - 5.28 x10E6/uL   Hemoglobin 14.3 11.1 - 15.9 g/dL   Hematocrit 41.9 34.0 - 46.6 %   MCV 90 79 - 97 fL   MCH 30.6 26.6 - 33.0 pg   MCHC 34.1 31.5 - 35.7 g/dL   RDW 13.7 12.3 - 15.4 %   Platelets 315 150 - 379 x10E3/uL   Neutrophils 58 Not Estab. %   Lymphs 33 Not Estab. %   Monocytes 8 Not Estab. %   Eos 1 Not Estab. %   Basos 0 Not Estab. %   Neutrophils Absolute 4.4 1.4 - 7.0 x10E3/uL   Lymphocytes Absolute 2.6 0.7 - 3.1 x10E3/uL   Monocytes Absolute 0.6 0.1 - 0.9 x10E3/uL   EOS (ABSOLUTE) 0.1 0.0 - 0.4 x10E3/uL   Basophils Absolute 0.0 0.0 - 0.2 x10E3/uL   Immature Granulocytes 0 Not Estab. %   Immature Grans (Abs) 0.0 0.0 -  0.1 x10E3/uL  Comprehensive metabolic panel  Result Value Ref Range   Glucose 92 65 - 99 mg/dL   BUN 11 8 - 27 mg/dL   Creatinine, Ser 0.95 0.57 - 1.00 mg/dL   GFR calc non Af Amer 65 >59 mL/min/1.73   GFR calc Af Amer 75 >59 mL/min/1.73   BUN/Creatinine Ratio 12 12 - 28   Sodium 144 134 - 144 mmol/L   Potassium 4.0 3.5 - 5.2 mmol/L   Chloride 105 96 - 106 mmol/L   CO2 20 18 - 29 mmol/L   Calcium 9.0 8.7 - 10.3 mg/dL   Total Protein 7.1 6.0 - 8.5 g/dL   Albumin 4.4 3.6 - 4.8 g/dL   Globulin, Total 2.7 1.5 - 4.5 g/dL   Albumin/Globulin Ratio 1.6 1.2 - 2.2   Bilirubin Total 0.2 0.0 - 1.2 mg/dL   Alkaline Phosphatase 102 39 - 117 IU/L   AST 15 0 - 40 IU/L   ALT 19 0 - 32 IU/L  Lipid Profile  Result Value Ref Range   Cholesterol, Total 155 100 - 199 mg/dL   Triglycerides 89 0 - 149 mg/dL   HDL 52 >39  mg/dL   VLDL Cholesterol Cal 18 5 - 40 mg/dL   LDL Calculated 85 0 - 99 mg/dL   Chol/HDL Ratio 3.0 0.0 - 4.4 ratio units  TSH  Result Value Ref Range   TSH 1.980 0.450 - 4.500 uIU/mL  Urinalysis, Routine w reflex microscopic (not at [facility])  Result Value Ref Range   Specific Gravity, UA <1.005 (L) 1.005 - 1.030   pH, UA 6.0 5.0 - 7.5   Color, UA Yellow Yellow   Appearance Ur Clear Clear   Leukocytes, UA Negative Negative   Protein, UA Negative Negative/Trace   Glucose, UA Negative Negative   Ketones, UA Negative Negative   RBC, UA 2+ (A) Negative   Bilirubin, UA Negative Negative   Urobilinogen, Ur 0.2 0.2 - 1.0 mg/dL   Nitrite, UA Negative Negative   Microscopic Examination See below:   HIV antibody (with reflex)  Result Value Ref Range   HIV Screen 4th Generation wRfx Non Reactive Non Reactive      Assessment & Plan:   Problem List Items Addressed This Visit    None    Visit Diagnoses    Benign skin lesion of face    -  Primary   Await pathology   Relevant Orders   Pathology     patient had on care of lesion excision site.  Follow up plan: Return for As scheduled.

## 2016-06-30 LAB — PATHOLOGY

## 2016-07-06 ENCOUNTER — Telehealth: Payer: Self-pay

## 2016-07-06 NOTE — Telephone Encounter (Signed)
Left message on machine for pt to return call to the office.  

## 2016-07-06 NOTE — Telephone Encounter (Signed)
-----   Message from Guadalupe Maple, MD sent at 07/05/2016  5:13 PM EST ----- Call tell normal labs  Pathology report with no cancer.

## 2016-07-06 NOTE — Telephone Encounter (Signed)
Message relayed to patient. Verbalized understanding and denied questions.   

## 2016-07-18 ENCOUNTER — Other Ambulatory Visit: Payer: Self-pay | Admitting: Family Medicine

## 2016-07-18 DIAGNOSIS — M961 Postlaminectomy syndrome, not elsewhere classified: Secondary | ICD-10-CM

## 2016-07-18 NOTE — Telephone Encounter (Signed)
Last (acute) OV: 06/27/16 Last routine OV: 04/06/16 Next OV: 10/04/16

## 2016-08-15 ENCOUNTER — Other Ambulatory Visit: Payer: Self-pay | Admitting: Family Medicine

## 2016-08-17 ENCOUNTER — Other Ambulatory Visit: Payer: Self-pay | Admitting: Family Medicine

## 2016-08-22 ENCOUNTER — Ambulatory Visit: Payer: Medicare Other | Admitting: Family Medicine

## 2016-09-14 ENCOUNTER — Ambulatory Visit (INDEPENDENT_AMBULATORY_CARE_PROVIDER_SITE_OTHER): Payer: Medicare Other | Admitting: Family Medicine

## 2016-09-14 ENCOUNTER — Encounter: Payer: Self-pay | Admitting: Family Medicine

## 2016-09-14 ENCOUNTER — Other Ambulatory Visit: Payer: Self-pay | Admitting: Family Medicine

## 2016-09-14 VITALS — BP 186/90 | HR 82 | Temp 98.0°F | Wt 130.0 lb

## 2016-09-14 DIAGNOSIS — G8929 Other chronic pain: Secondary | ICD-10-CM

## 2016-09-14 DIAGNOSIS — M545 Low back pain, unspecified: Secondary | ICD-10-CM

## 2016-09-14 DIAGNOSIS — F419 Anxiety disorder, unspecified: Secondary | ICD-10-CM

## 2016-09-14 DIAGNOSIS — I10 Essential (primary) hypertension: Secondary | ICD-10-CM

## 2016-09-14 DIAGNOSIS — M961 Postlaminectomy syndrome, not elsewhere classified: Secondary | ICD-10-CM

## 2016-09-14 MED ORDER — TRIAMCINOLONE ACETONIDE 55 MCG/ACT NA AERO: 2.0000 | INHALATION_SPRAY | Freq: Every day | NASAL | 12 refills | Status: DC

## 2016-09-14 MED ORDER — CETIRIZINE HCL 10 MG PO TABS: 10.0000 mg | ORAL_TABLET | Freq: Every day | ORAL | 11 refills | Status: DC

## 2016-09-14 MED ORDER — METOPROLOL SUCCINATE ER 100 MG PO TB24: 100.0000 mg | ORAL_TABLET | Freq: Every day | ORAL | 1 refills | Status: DC

## 2016-09-14 MED ORDER — ALPRAZOLAM 0.5 MG PO TABS: 0.5000 mg | ORAL_TABLET | Freq: Two times a day (BID) | ORAL | 0 refills | Status: DC | PRN

## 2016-09-14 NOTE — Progress Notes (Signed)
BP (!) 186/90   Pulse 82   Temp 98 F (36.7 C)   Wt 130 lb (59 kg)   SpO2 97%   BMI 20.36 kg/m    Subjective:    Patient ID: Caitlyn May, female    DOB: May 02, 1956, 61 y.o.   MRN: [id number]  HPI: Caitlyn May is a 61 y.o. female  Chief Complaint  Patient presents with  . Hypertension    been running high for about 6 weeks. Has been taking all of her BP meds. She has recently had to come off all her pain meds, so has been having alot of pain.  . Medication Refill    she needs a refill on Xanax   Patient presents for anxiety f/u, has been fairly stable on xanax long-term. No side effects, typically takes 1-2 times daily. Has recently weaned off all chronic pain medications and having a significant amount of trouble since. Xanax has been helping to relax her.   BPs have been running high the past few weeks, not usually as high as it was reading today but higher than normal. Feels this is associated with her uncontrolled chronic pain. Using her tens unit more frequently than she should.  Thinking it may be time to get back into pain clinic. Taking her BP medications faithfully without side effects.   Past Medical History:  Diagnosis Date  . Chronic kidney disease   . Depression   . Failed back syndrome, lumbar   . Hyperlipidemia   . Hypertension   . Osteoporosis   . Weight loss    Social History   Social History  . Marital status: Married    Spouse name: N/A  . Number of children: N/A  . Years of education: N/A   Occupational History  . Not on file.   Social History Main Topics  . Smoking status: Current Every Day Smoker    Packs/day: 1.00    Types: Cigarettes  . Smokeless tobacco: Never Used  . Alcohol use No  . Drug use: No  . Sexual activity: Not on file   Other Topics Concern  . Not on file   Social History Narrative  . No narrative on file    Relevant past medical, surgical, family and social history reviewed and updated as indicated.  Interim medical history since our last visit reviewed. Allergies and medications reviewed and updated.  Review of Systems  Constitutional: Negative.   HENT: Negative.   Respiratory: Negative.   Cardiovascular: Negative.   Gastrointestinal: Negative.   Genitourinary: Negative.   Musculoskeletal: Positive for arthralgias and back pain.  Neurological: Negative.   Psychiatric/Behavioral: The patient is nervous/anxious.     Per HPI unless specifically indicated above     Objective:    BP (!) 186/90   Pulse 82   Temp 98 F (36.7 C)   Wt 130 lb (59 kg)   SpO2 97%   BMI 20.36 kg/m   Wt Readings from Last 3 Encounters:  09/14/16 130 lb (59 kg)  06/27/16 122 lb (55.3 kg)  06/13/16 132 lb 3.2 oz (60 kg)    Physical Exam  Constitutional: She is oriented to person, place, and time. She appears well-developed and well-nourished.  HENT:  Head: Atraumatic.  Eyes: Conjunctivae are normal. Pupils are equal, round, and reactive to light.  Neck: Normal range of motion. Neck supple.  Cardiovascular: Normal rate and normal heart sounds.   Pulmonary/Chest: Effort normal and breath sounds normal. No respiratory distress.  Musculoskeletal: Normal range of motion.  Neurological: She is alert and oriented to person, place, and time.  Skin: Skin is warm and dry.  Psychiatric: Her behavior is normal.  tearful  Nursing note and vitals reviewed.     Assessment & Plan:   Problem List Items Addressed This Visit    None    Visit Diagnoses    Chronic bilateral low back pain without sciatica    -  Primary       Follow up plan: No Follow-up on file.

## 2016-09-14 NOTE — Assessment & Plan Note (Signed)
Not at goal, while some of this may be due to her uncontrolled pain, suspect she may also need to increase medication. Will increase metoprolol to 100 mg, continue current regimen as it was otherwise with amlodipine and benazepril. Monitor carefully, f/u with persistently abnormal readings. F/u for recheck and fasting labs in 6 weeks.

## 2016-09-14 NOTE — Patient Instructions (Addendum)
Lidoderm patches Take 2 50 mg metoprolol tablets until you run out, then switch to the new 100 mg tablets that are at the pharmacy.

## 2016-09-14 NOTE — Telephone Encounter (Signed)
Last (acute) OV: 09/14/16 Last routine OV: 04/06/16 Next OV: 10/04/16

## 2016-09-19 ENCOUNTER — Telehealth: Payer: Self-pay | Admitting: Family Medicine

## 2016-09-19 NOTE — Telephone Encounter (Signed)
Patient would like a referral to a pain clinic either in [place] or [place].  Thanks  [phone]

## 2016-09-19 NOTE — Telephone Encounter (Signed)
Please send to different clinic

## 2016-09-21 NOTE — Telephone Encounter (Signed)
Tried calling patient. LVM to return my call.

## 2016-09-26 NOTE — Telephone Encounter (Signed)
LVM for patient.  Would like to know if it's okay to send her referral to [facility] or [place].   Asked to return my call.

## 2016-09-27 ENCOUNTER — Encounter: Payer: Self-pay | Admitting: Family Medicine

## 2016-09-27 ENCOUNTER — Ambulatory Visit (INDEPENDENT_AMBULATORY_CARE_PROVIDER_SITE_OTHER): Payer: Medicare Other | Admitting: Family Medicine

## 2016-09-27 DIAGNOSIS — I10 Essential (primary) hypertension: Secondary | ICD-10-CM | POA: Diagnosis not present

## 2016-09-27 DIAGNOSIS — M961 Postlaminectomy syndrome, not elsewhere classified: Secondary | ICD-10-CM

## 2016-09-27 MED ORDER — PREDNISONE 10 MG PO TABS: ORAL_TABLET | ORAL | 0 refills | Status: DC

## 2016-09-27 MED ORDER — HYDROCHLOROTHIAZIDE 25 MG PO TABS: 25.0000 mg | ORAL_TABLET | Freq: Every day | ORAL | 2 refills | Status: DC

## 2016-09-27 NOTE — Telephone Encounter (Signed)
Referral submitted to [facility] in [place]. They will call and schedule appointment.

## 2016-09-27 NOTE — Telephone Encounter (Signed)
Pt called and stated she was willing to go between [facility] to [place].

## 2016-09-27 NOTE — Assessment & Plan Note (Signed)
Hypertension continued poor control will add hydrochlorothiazide 25 mg

## 2016-09-27 NOTE — Assessment & Plan Note (Signed)
Ongoing chronic pain with problems will make appointment at pain clinic for further evaluation and care. With acute situation and radicular pain will do a trial of prednisone

## 2016-09-27 NOTE — Progress Notes (Signed)
   BP (!) 174/84   Pulse 66   Temp 98.4 F (36.9 C) (Oral)   Wt 130 lb 3.2 oz (59.1 kg)   SpO2 98%   BMI 20.39 kg/m    Subjective:    Patient ID: Caitlyn May, female    DOB: 05-19-1956, 61 y.o.   MRN: [id number]  HPI: Caitlyn May is a 61 y.o. female  Chief Complaint  Patient presents with  . Pain  Patient with long history of failed back chronic back pain but has been off of pain medications not pain-free since last July. This is in response to difficulties with pain clinic and current political climate and or contacts. This is also due to our office not writing anymore controlled drugs for pain. Patient had done okay since that time until about 3-4 weeks ago when she was talking with her neighbor in her yard and her much dog charged enthusiastically into her legs knocking her down. Ever since that time patient's back is been with severe pain with radicular pain down both legs. Unfortunately patient has been trying to get in with pain clinic but is been playing phone tag with appointment secretary and has not made an appointment yet but is an active process of getting her appointment.   Has not had good blood pressure control ever since patient's pain was exacerbated. He shouldn't taken her blood pressure medication faithfully.   Relevant past medical, surgical, family and social history reviewed and updated as indicated. Interim medical history since our last visit reviewed. Allergies and medications reviewed and updated.  Review of Systems  Constitutional: Negative.   Respiratory: Negative.   Cardiovascular: Negative.     Per HPI unless specifically indicated above     Objective:    BP (!) 174/84   Pulse 66   Temp 98.4 F (36.9 C) (Oral)   Wt 130 lb 3.2 oz (59.1 kg)   SpO2 98%   BMI 20.39 kg/m   Wt Readings from Last 3 Encounters:  09/27/16 130 lb 3.2 oz (59.1 kg)  09/14/16 130 lb (59 kg)  06/27/16 122 lb (55.3 kg)    Physical Exam  Constitutional:  She is oriented to person, place, and time. She appears well-developed and well-nourished.  HENT:  Head: Normocephalic and atraumatic.  Eyes: Conjunctivae and EOM are normal.  Neck: Normal range of motion.  Cardiovascular: Normal rate, regular rhythm and normal heart sounds.   Pulmonary/Chest: Effort normal and breath sounds normal.  Musculoskeletal: Normal range of motion.  Neurological: She is alert and oriented to person, place, and time.  Skin: No erythema.  Psychiatric: She has a normal mood and affect. Her behavior is normal. Judgment and thought content normal.        Assessment & Plan:   Problem List Items Addressed This Visit      Cardiovascular and Mediastinum   Hypertension    Hypertension continued poor control will add hydrochlorothiazide 25 mg      Relevant Medications   hydrochlorothiazide (HYDRODIURIL) 25 MG tablet     Other   Postlaminectomy syndrome, lumbar region    Ongoing chronic pain with problems will make appointment at pain clinic for further evaluation and care. With acute situation and radicular pain will do a trial of prednisone          Follow up plan: Return in about 4 weeks (around 10/25/2016) for BMP,  Lipids, ALT, AST.

## 2016-10-03 ENCOUNTER — Telehealth: Payer: Self-pay | Admitting: Family Medicine

## 2016-10-03 NOTE — Telephone Encounter (Signed)
Pt called and stated she hadn't heard from the pain clinic and would like to know how long it normally took to hear from them. I explained that Keri had sent the referral the same day we spoke and I gave her the number for that office and she stated that she would call them and call me back.

## 2016-10-04 ENCOUNTER — Ambulatory Visit: Payer: Medicare Other | Admitting: Family Medicine

## 2016-10-04 NOTE — Telephone Encounter (Signed)
Spoke with patient. Patient denied referral to [facility] because of the wait. Patient stated she couldn't wait that long. Patient wants to go back to [facility].  Redirecting referral to [facility].

## 2016-10-07 ENCOUNTER — Telehealth: Payer: Self-pay | Admitting: Family Medicine

## 2016-10-07 NOTE — Telephone Encounter (Signed)
Pt called and stated after taking hydrocholorothiazide both of her feet were swollen and she would like to know if there was anything else she could take.

## 2016-10-07 NOTE — Telephone Encounter (Signed)
From OV on 09/27/16, "  Hypertension continued poor control will add hydrochlorothiazide 25 mg  "   Pt complaining of edema. Would like to switch medications. Pt is also currently on metoprolol XR 100 mg, benazepril 40, amlodipine 10 mg all daily. Please advise.   BP Readings from Last 3 Encounters:  09/27/16 (!) 174/84  09/14/16 (!) 186/90  06/27/16 122/75

## 2016-10-10 NOTE — Telephone Encounter (Signed)
Pt scheduled for appointment with Apolonio Schneiders tomorrow.

## 2016-10-10 NOTE — Telephone Encounter (Signed)
Patient called to speak with [facility]. [phone] Thanks

## 2016-10-10 NOTE — Telephone Encounter (Signed)
Left message on machine for pt to return call to the office.  

## 2016-10-10 NOTE — Telephone Encounter (Signed)
Needs appointment

## 2016-10-11 ENCOUNTER — Ambulatory Visit: Payer: Medicare Other | Admitting: Family Medicine

## 2016-10-12 ENCOUNTER — Ambulatory Visit: Payer: Medicare Other | Admitting: Family Medicine

## 2016-10-13 ENCOUNTER — Telehealth: Payer: Self-pay | Admitting: Pain Medicine

## 2016-10-13 NOTE — Telephone Encounter (Signed)
Patient lvmail stating she would like to sched NP appt.

## 2016-10-14 ENCOUNTER — Other Ambulatory Visit: Payer: Self-pay | Admitting: Family Medicine

## 2016-10-17 ENCOUNTER — Other Ambulatory Visit: Payer: Self-pay | Admitting: Family Medicine

## 2016-10-19 ENCOUNTER — Other Ambulatory Visit: Payer: Self-pay | Admitting: Family Medicine

## 2016-10-20 ENCOUNTER — Ambulatory Visit (INDEPENDENT_AMBULATORY_CARE_PROVIDER_SITE_OTHER): Payer: Medicare Other | Admitting: Family Medicine

## 2016-10-20 ENCOUNTER — Ambulatory Visit: Payer: Medicare Other | Admitting: Family Medicine

## 2016-10-20 ENCOUNTER — Encounter: Payer: Self-pay | Admitting: Family Medicine

## 2016-10-20 VITALS — BP 110/78 | HR 76 | Temp 97.4°F | Wt 129.0 lb

## 2016-10-20 DIAGNOSIS — R42 Dizziness and giddiness: Secondary | ICD-10-CM

## 2016-10-20 NOTE — Progress Notes (Signed)
   BP 110/78   Pulse 76   Temp 97.4 F (36.3 C)   Wt 129 lb (58.5 kg)   SpO2 95%   BMI 20.20 kg/m    Subjective:    Patient ID: Caitlyn May, female    DOB: 07/04/1955, 61 y.o.   MRN: [id number]  HPI: Caitlyn May is a 61 y.o. female  Chief Complaint  Patient presents with  . Hypotension    This afternoon was taking a shower and felt like all of her energy was drained, checked her BP and it was 66/46. She had not ate anything all day. Still lightheaded but it's not as bad. She stopped the HCTZ last week because it was causing her feet to swell.    Patient presents today for fatigue and dizziness earlier this afternoon. Checked her BP during the episode and states it was 66/46. Had not eaten anything so ate 2 pieces of pizza and had some sweet tea and started to feel much better. Still somewhat lightheaded but feeling much better overall. Notes that she drinks sweet tea and sprite all day every day and does not ever drink plain water or gatorade. Also eats a maximum of one meal daily.   Relevant past medical, surgical, family and social history reviewed and updated as indicated. Interim medical history since our last visit reviewed. Allergies and medications reviewed and updated.  Review of Systems  Constitutional: Positive for fatigue.  Respiratory: Negative.   Cardiovascular: Negative.   Gastrointestinal: Negative.   Genitourinary: Negative.   Musculoskeletal: Positive for arthralgias (chronic pain).  Neurological: Positive for light-headedness.  Psychiatric/Behavioral: Negative.    Per HPI unless specifically indicated above     Objective:    BP 110/78   Pulse 76   Temp 97.4 F (36.3 C)   Wt 129 lb (58.5 kg)   SpO2 95%   BMI 20.20 kg/m   Wt Readings from Last 3 Encounters:  10/20/16 129 lb (58.5 kg)  09/27/16 130 lb 3.2 oz (59.1 kg)  09/14/16 130 lb (59 kg)    Physical Exam  Constitutional: She is oriented to person, place, and time. She appears  well-developed. No distress.  HENT:  Head: Atraumatic.  Eyes: Conjunctivae are normal. Pupils are equal, round, and reactive to light. No scleral icterus.  Neck: Normal range of motion. Neck supple.  Cardiovascular: Normal rate and normal heart sounds.   Pulmonary/Chest: Effort normal and breath sounds normal. No respiratory distress.  Musculoskeletal: Normal range of motion.  Lymphadenopathy:    She has no cervical adenopathy.  Neurological: She is alert and oriented to person, place, and time. No cranial nerve deficit.  Skin: Skin is warm and dry.  Psychiatric: She has a normal mood and affect. Her behavior is normal.  Nursing note and vitals reviewed.     Assessment & Plan:   Problem List Items Addressed This Visit    None    Visit Diagnoses    Dizziness    -  Primary   Resolved with meal and hydration. Discussed importance proper hydration with unsweetened drinks, mainly plain water and several balanced meals daily.    Relevant Orders   CBC with Differential/Platelet (Completed)   Comprehensive metabolic panel (Completed)   UA/M w/rflx Culture, Routine (Completed)    Will check some basic labs to r/o organic cause or infection but suspect hydration related. Return precautions given.    Follow up plan: Return for as scheduled in 2 weeks.

## 2016-10-21 ENCOUNTER — Telehealth: Payer: Self-pay | Admitting: Family Medicine

## 2016-10-21 LAB — CBC WITH DIFFERENTIAL/PLATELET
BASOS: 0 %
Basophils Absolute: 0 10*3/uL (ref 0.0–0.2)
EOS (ABSOLUTE): 0.1 10*3/uL (ref 0.0–0.4)
EOS: 1 %
HEMATOCRIT: 40.3 % (ref 34.0–46.6)
Hemoglobin: 13.7 g/dL (ref 11.1–15.9)
IMMATURE GRANS (ABS): 0 10*3/uL (ref 0.0–0.1)
IMMATURE GRANULOCYTES: 0 %
Lymphocytes Absolute: 2.2 10*3/uL (ref 0.7–3.1)
Lymphs: 33 %
MCH: 32.2 pg (ref 26.6–33.0)
MCHC: 34 g/dL (ref 31.5–35.7)
MCV: 95 fL (ref 79–97)
MONOS ABS: 0.7 10*3/uL (ref 0.1–0.9)
Monocytes: 10 %
NEUTROS PCT: 56 %
Neutrophils Absolute: 3.8 10*3/uL (ref 1.4–7.0)
Platelets: 261 10*3/uL (ref 150–379)
RBC: 4.26 x10E6/uL (ref 3.77–5.28)
RDW: 13.2 % (ref 12.3–15.4)
WBC: 6.8 10*3/uL (ref 3.4–10.8)

## 2016-10-21 LAB — UA/M W/RFLX CULTURE, ROUTINE
Bilirubin, UA: NEGATIVE
Glucose, UA: NEGATIVE
LEUKOCYTES UA: NEGATIVE
Nitrite, UA: NEGATIVE
Specific Gravity, UA: 1.025 (ref 1.005–1.030)
Urobilinogen, Ur: 0.2 mg/dL (ref 0.2–1.0)
pH, UA: 5 (ref 5.0–7.5)

## 2016-10-21 LAB — COMPREHENSIVE METABOLIC PANEL
A/G RATIO: 1.8 (ref 1.2–2.2)
ALBUMIN: 4.2 g/dL (ref 3.6–4.8)
ALT: 16 IU/L (ref 0–32)
AST: 16 IU/L (ref 0–40)
Alkaline Phosphatase: 87 IU/L (ref 39–117)
BUN / CREAT RATIO: 11 — AB (ref 12–28)
BUN: 14 mg/dL (ref 8–27)
Bilirubin Total: 0.2 mg/dL (ref 0.0–1.2)
CALCIUM: 9.1 mg/dL (ref 8.7–10.3)
CO2: 22 mmol/L (ref 18–29)
Chloride: 103 mmol/L (ref 96–106)
Creatinine, Ser: 1.24 mg/dL — ABNORMAL HIGH (ref 0.57–1.00)
GFR calc non Af Amer: 47 mL/min/{1.73_m2} — ABNORMAL LOW (ref >59)
GFR, EST AFRICAN AMERICAN: 54 mL/min/{1.73_m2} — AB (ref >59)
GLOBULIN, TOTAL: 2.4 g/dL (ref 1.5–4.5)
Glucose: 79 mg/dL (ref 65–99)
POTASSIUM: 4.3 mmol/L (ref 3.5–5.2)
SODIUM: 141 mmol/L (ref 134–144)
TOTAL PROTEIN: 6.6 g/dL (ref 6.0–8.5)

## 2016-10-21 LAB — MICROSCOPIC EXAMINATION

## 2016-10-21 NOTE — Telephone Encounter (Signed)
Please call pt and let her know that her labs came back negative for any signs of infection in her blood or urine but did show that she's pretty dehydrated as we figured when we talked yesterday. Encourage her to drink plenty of plain water and eat at least 2 solid meals per day

## 2016-10-21 NOTE — Telephone Encounter (Signed)
Patient notified of results.

## 2016-11-02 ENCOUNTER — Ambulatory Visit: Payer: Medicare Other | Admitting: Family Medicine

## 2016-11-10 ENCOUNTER — Encounter: Payer: Self-pay | Admitting: Family Medicine

## 2016-11-10 ENCOUNTER — Ambulatory Visit (INDEPENDENT_AMBULATORY_CARE_PROVIDER_SITE_OTHER): Payer: Medicare Other | Admitting: Family Medicine

## 2016-11-10 VITALS — BP 90/60 | HR 109 | Temp 98.6°F | Wt 130.0 lb

## 2016-11-10 DIAGNOSIS — M961 Postlaminectomy syndrome, not elsewhere classified: Secondary | ICD-10-CM | POA: Diagnosis not present

## 2016-11-10 DIAGNOSIS — N183 Chronic kidney disease, stage 3 unspecified: Secondary | ICD-10-CM

## 2016-11-10 DIAGNOSIS — R946 Abnormal results of thyroid function studies: Secondary | ICD-10-CM | POA: Diagnosis not present

## 2016-11-10 DIAGNOSIS — R55 Syncope and collapse: Secondary | ICD-10-CM | POA: Diagnosis not present

## 2016-11-10 DIAGNOSIS — I10 Essential (primary) hypertension: Secondary | ICD-10-CM | POA: Diagnosis not present

## 2016-11-10 DIAGNOSIS — E782 Mixed hyperlipidemia: Secondary | ICD-10-CM

## 2016-11-10 DIAGNOSIS — R7989 Other specified abnormal findings of blood chemistry: Secondary | ICD-10-CM

## 2016-11-10 MED ORDER — DICLOFENAC SODIUM 1 % TD GEL: 2.0000 g | Freq: Four times a day (QID) | TRANSDERMAL | 6 refills | Status: DC

## 2016-11-10 MED ORDER — CYCLOBENZAPRINE HCL 5 MG PO TABS: 5.0000 mg | ORAL_TABLET | Freq: Two times a day (BID) | ORAL | 1 refills | Status: DC | PRN

## 2016-11-10 NOTE — Progress Notes (Signed)
BP 90/60   Pulse (!) 109   Temp 98.6 F (37 C)   Wt 130 lb (59 kg)   SpO2 96%   BMI 20.36 kg/m    Subjective:    Patient ID: Caitlyn May, female    DOB: 10-27-1955, 61 y.o.   MRN: [id number]  HPI: DELLIA DONNELLY is a 61 y.o. female  Chief Complaint  Patient presents with  . Hypertension    follow up. She has 2 oes of Metoprolol, 50 and 100mg . States her BP drops too low when she takes the 100mg .  . Hyperlipidemia    follow up  . Thyroid Problem    recheck elevate TSH  . Medication Problem    Cyclobenzaprine was written to take BID, but only got a qty of 30. Alprazolam also says BID but only a qty of 50.   Patient presents for BP follow up after hypotensive episode several weeks ago. Currently taking 50 mg metoprolol, 40 mg benazepril, and 10 mg amlodipine. Checking readings multiple times daily and mostly getting 90s/60s-110/60s. 2-3 borderline elevated readings but otherwise low normal. Has had 2-3 syncopal/presyncopal episodes since last visit and states her BP machine gave her an error reading for irregular heartbeat at one time. Doing better with hydration and trying to eat more throughout the days. Less dizziness noted than before.   No concerns with lipitor, takes consistently without side effects. Does not exercise or watch what she eats. Denies CP, SOB, claudications, leg cramps.   Has upcoming re-est visit with pain clinic but taking full amount allotted of flexeril until then. Needing a refill written for full quantity today. This has been helping significantly to control her chronic pain in the meantime.   Past Medical History:  Diagnosis Date  . Chronic kidney disease   . Depression   . Failed back syndrome, lumbar   . Hyperlipidemia   . Hypertension   . Osteoporosis   . Weight loss    Social History   Social History  . Marital status: Married    Spouse name: N/A  . Number of children: N/A  . Years of education: N/A   Occupational History    . Not on file.   Social History Main Topics  . Smoking status: Current Every Day Smoker    Packs/day: 2.00    Types: Cigarettes  . Smokeless tobacco: Never Used  . Alcohol use No  . Drug use: No  . Sexual activity: Not on file   Other Topics Concern  . Not on file   Social History Narrative  . No narrative on file   Relevant past medical, surgical, family and social history reviewed and updated as indicated. Interim medical history since our last visit reviewed. Allergies and medications reviewed and updated.  Review of Systems  Constitutional: Negative.   HENT: Negative.   Eyes: Negative.   Respiratory: Negative.   Cardiovascular: Negative.   Gastrointestinal: Negative.   Genitourinary: Negative.   Musculoskeletal: Positive for arthralgias and myalgias.  Neurological: Positive for dizziness and syncope.  Psychiatric/Behavioral: Negative.     Per HPI unless specifically indicated above     Objective:    BP 90/60   Pulse (!) 109   Temp 98.6 F (37 C)   Wt 130 lb (59 kg)   SpO2 96%   BMI 20.36 kg/m   Wt Readings from Last 3 Encounters:  11/10/16 130 lb (59 kg)  10/20/16 129 lb (58.5 kg)  09/27/16 130 lb 3.2 oz (  59.1 kg)    Physical Exam  Constitutional: She appears well-developed and well-nourished. No distress.  HENT:  Head: Atraumatic.  Eyes: Conjunctivae are normal. Pupils are equal, round, and reactive to light. No scleral icterus.  Neck: Normal range of motion. Neck supple.  Cardiovascular: Normal rate and normal heart sounds.   Pulmonary/Chest: Effort normal and breath sounds normal. No respiratory distress.  Musculoskeletal: Normal range of motion.  Neurological: She is alert. No cranial nerve deficit.  Skin: Skin is warm and dry.  Psychiatric: She has a normal mood and affect. Her behavior is normal.  Nursing note and vitals reviewed.     Assessment & Plan:   Problem List Items Addressed This Visit      Cardiovascular and Mediastinum    Hypertension - Primary    Will d/c amlodipine and continue to monitor closely. Continue 50 mg metoprolol and benazepril. Pt will keep [place] informed on what home readings and sxs are doing and we will adjust as needed. F/u in 2 months for recheck      Relevant Orders   Basic metabolic panel (Completed)     Other   Postlaminectomy syndrome, lumbar region    Flexeril refilled, continue current regimen and await upcoming pain clinic re-establish visit      Relevant Medications   cyclobenzaprine (FLEXERIL) 5 MG tablet   Hyperlipidemia    Stable and at goal, continue lipitor. Discussed good lifestyle modifications      Relevant Orders   LP+ALT+AST Piccol[place], Waived (Completed)   Elevated TSH    Will recheck today, await results.       Relevant Orders   TSH (Completed)    Other Visit Diagnoses    CKD (chronic kidney disease) stage 3, GFR 30-59 ml/min       Await BMP. Continue to improve hydration habits.    Relevant Orders   Basic metabolic panel (Completed)   Syncope, unspecified syncope type       Will refer to cardiology given frequency of these episodes the past 2 years. Return precautions given.    Relevant Orders   Ambulatory referral to Cardiology       Follow up plan: Return in about 2 months (around 01/10/2017) for BP.

## 2016-11-10 NOTE — Patient Instructions (Signed)
Stop taking amlodipine, take 50 mg metoprolol and the benazepril daily Continue to monitor closely, let us know about any persistent abnormal readings.

## 2016-11-11 ENCOUNTER — Telehealth: Payer: Self-pay | Admitting: Family Medicine

## 2016-11-11 ENCOUNTER — Encounter: Payer: Self-pay | Admitting: Family Medicine

## 2016-11-11 LAB — BASIC METABOLIC PANEL
BUN/Creatinine Ratio: 7 — ABNORMAL LOW (ref 12–28)
BUN: 10 mg/dL (ref 8–27)
CHLORIDE: 101 mmol/L (ref 96–106)
CO2: 20 mmol/L (ref 20–29)
CREATININE: 1.39 mg/dL — AB (ref 0.57–1.00)
Calcium: 9 mg/dL (ref 8.7–10.3)
GFR calc Af Amer: 47 mL/min/{1.73_m2} — ABNORMAL LOW (ref >59)
GFR calc non Af Amer: 41 mL/min/{1.73_m2} — ABNORMAL LOW (ref >59)
Glucose: 100 mg/dL — ABNORMAL HIGH (ref 65–99)
Potassium: 3.7 mmol/L (ref 3.5–5.2)
SODIUM: 138 mmol/L (ref 134–144)

## 2016-11-11 LAB — LP+ALT+AST PICCOLO, WAIVED
ALT (SGPT) Piccolo, Waived: 27 U/L (ref 10–47)
AST (SGOT) Piccolo, Waived: 24 U/L (ref 11–38)
CHOL/HDL RATIO PICCOLO,WAIVE: 2.3 mg/dL
Cholesterol Piccolo, Waived: 140 mg/dL (ref <200)
HDL Chol Piccolo, Waived: 61 mg/dL (ref >59)
LDL CHOL CALC PICCOLO WAIVED: 57 mg/dL (ref <100)
TRIGLYCERIDES PICCOLO,WAIVED: 109 mg/dL (ref <150)
VLDL CHOL CALC PICCOLO,WAIVE: 22 mg/dL (ref <30)

## 2016-11-11 LAB — TSH: TSH: 1.2 u[IU]/mL (ref 0.450–4.500)

## 2016-11-11 NOTE — Telephone Encounter (Signed)
I think it would be fine as long as she doesn't experience any more passing out spells before then

## 2016-11-11 NOTE — Telephone Encounter (Signed)
Patient notified. I advised her that if she has another episode to either let us know or call the cardiologist's office to see if they can move her appt up. She understood and agreed.

## 2016-11-11 NOTE — Telephone Encounter (Signed)
Patient called to let Apolonio Schneiders know the earliest the cardiologist could see her was 08-16 @ 9 and Apolonio Schneiders had told her she needed to see the cardiologist ASAP.  [phone]  Thank You

## 2016-11-11 NOTE — Telephone Encounter (Signed)
Please advise 

## 2016-11-12 NOTE — Assessment & Plan Note (Signed)
Flexeril refilled, continue current regimen and await upcoming pain clinic re-establish visit

## 2016-11-12 NOTE — Assessment & Plan Note (Signed)
Stable and at goal, continue lipitor. Discussed good lifestyle modifications

## 2016-11-12 NOTE — Assessment & Plan Note (Addendum)
Will d/c amlodipine and continue to monitor closely. Continue 50 mg metoprolol and benazepril. Pt will keep [place] informed on what home readings and sxs are doing and we will adjust as needed. F/u in 2 months for recheck

## 2016-11-12 NOTE — Assessment & Plan Note (Signed)
Will recheck today, await results.

## 2016-11-28 DIAGNOSIS — M542 Cervicalgia: Secondary | ICD-10-CM

## 2016-11-28 DIAGNOSIS — M5030 Other cervical disc degeneration, unspecified cervical region: Secondary | ICD-10-CM | POA: Insufficient documentation

## 2016-11-28 DIAGNOSIS — Z79899 Other long term (current) drug therapy: Secondary | ICD-10-CM | POA: Insufficient documentation

## 2016-11-28 DIAGNOSIS — M961 Postlaminectomy syndrome, not elsewhere classified: Secondary | ICD-10-CM | POA: Insufficient documentation

## 2016-11-28 DIAGNOSIS — G894 Chronic pain syndrome: Secondary | ICD-10-CM | POA: Insufficient documentation

## 2016-11-28 DIAGNOSIS — M5441 Lumbago with sciatica, right side: Secondary | ICD-10-CM

## 2016-11-28 DIAGNOSIS — M79605 Pain in left leg: Secondary | ICD-10-CM

## 2016-11-28 DIAGNOSIS — F1190 Opioid use, unspecified, uncomplicated: Secondary | ICD-10-CM | POA: Insufficient documentation

## 2016-11-28 DIAGNOSIS — Z79891 Long term (current) use of opiate analgesic: Secondary | ICD-10-CM | POA: Insufficient documentation

## 2016-11-28 DIAGNOSIS — M5410 Radiculopathy, site unspecified: Secondary | ICD-10-CM

## 2016-11-28 DIAGNOSIS — M5416 Radiculopathy, lumbar region: Secondary | ICD-10-CM | POA: Insufficient documentation

## 2016-11-28 DIAGNOSIS — M5136 Other intervertebral disc degeneration, lumbar region: Secondary | ICD-10-CM | POA: Insufficient documentation

## 2016-11-28 DIAGNOSIS — G8929 Other chronic pain: Secondary | ICD-10-CM | POA: Insufficient documentation

## 2016-11-28 DIAGNOSIS — M79604 Pain in right leg: Secondary | ICD-10-CM

## 2016-11-28 DIAGNOSIS — M5442 Lumbago with sciatica, left side: Secondary | ICD-10-CM

## 2016-11-28 NOTE — Progress Notes (Signed)
Patient's Name: Caitlyn May  MRN: [id number]  Referring Provider: Volney American,*  DOB: 1956/05/04  PCP: Guadalupe Maple, MD  DOS: 11/29/2016  Note by: Kathlen Brunswick. Dossie Arbour, MD  Service setting: Ambulatory outpatient  Specialty: Interventional Pain Management  Location: [facility] (AMB) Pain Management Facility  Visit type: Initial Patient Evaluation  Patient type: New Patient   Primary Reason(s) for Visit: Encounter for initial evaluation of one or more chronic problems (new to examiner) potentially causing chronic pain, and posing a threat to normal musculoskeletal function. (Level of risk: High) CC: Back Pain (lower) and Neck Pain  HPI  [name] is a 61 y.o. year old, female patient, who comes today to see [place] for the first time for an initial evaluation of her chronic pain. She has Postlaminectomy syndrome, lumbar region; Depression; Hyperlipidemia; Hypertension; Acid reflux; Elevated TSH; Long term prescription benzodiazepine use; Chronic pain syndrome; Long term (current) use of opiate analgesic; Long term prescription opiate use; Opiate use; Failed back surgical syndrome; Chronic low back pain (Location of Primary Source of Pain) (Bilateral) (R>L); Lumbar radiculitis (Right); Chronic lower extremity pain (Bilateral) (R>L); Chronic lumbar radicular pain (Bilateral) (R>L); Chronic neck pain (Location of Secondary source of pain) (Bilateral) (R>L); DDD (degenerative disc disease), lumbar; DDD (degenerative disc disease), cervical; Disturbance of skin sensation; Chronic shoulder pain (Location of Tertiary source of pain) (Bilateral) (R>L); Chronic upper extremity pain (Bilateral) (R>L); Chronic cervical radicular pain (Bilateral) (R>L); Chronic hip pain (Bilateral) (R>L); Osteoarthritis of hip (Bilateral); Chronic sacroiliac joint pain (Bilateral); and Lumbar facet syndrome (Right) on her problem list. Today she comes in for evaluation of her Back Pain (lower) and Neck Pain  Pain  Assessment: Location:   Neck Radiating: both arms Onset: More than a month ago Duration: Chronic pain Quality: Larence Penning Severity: 6 /10 (self-reported pain score)  Note: Reported level is compatible with observation.                    Timing: Constant Modifying factors: hot shower  Onset and Duration: Present longer than 3 months Cause of pain: Surgery Severity: Getting worse, NAS-11 at its worse: 10/10, NAS-11 at its best: 4/10, NAS-11 now: 6/10 and NAS-11 on the average: 5/10 Timing: Morning, Afternoon, Night, During activity or exercise and After activity or exercise Aggravating Factors: Bending, Climbing, Kneeling, Lifiting, Motion, Prolonged sitting, Prolonged standing, Squatting, Stooping , Surgery made it worse, Twisting, Walking, Walking uphill, Walking downhill and Working Alleviating Factors: Hot packs, Medications, Resting and Warm showers or baths Associated Problems: Day-time cramps, Night-time cramps, Depression, Dizziness, Fatigue, Nausea, Numbness, Spasms, Sweating, Swelling, Temperature changes, Tingling, Vomiting , Weakness and Pain that wakes patient up Quality of Pain: Aching, Agonizing, Annoying, Burning, Constant, Cramping, Cruel, Deep, Disabling, Distressing, Dreadful, Dull, Exhausting, Fearful, Feeling of constriction, Feeling of weight, Getting longer, Heavy, Horrible, Hot, Nagging, Pressure-like, Pulsating, Punishing, Sharp, Shooting, Sickening, Stabbing, Tender, Throbbing, Tingling, Tiring, Toothache-like and Uncomfortable Previous Examinations or Tests: The patient denies since was last here in 2011 Previous Treatments: The patient denies since was last here in 2011  The patient comes into the clinics today for the first time for a chronic pain management evaluation. According to the patient a primary area of pain is that of the lower back with the right side being worst on the left. The patient indicates having had a lumbar spine surgery done at [facility]  approximately 25 years ago. She indicates that since that surgery she has not been the same. She does admit  to having had some nerve blocks in the lumbar spine the last of which were done around 2011 by me. This patient used to be a patient of our practice around that time. The patient denies any recent physical therapy or x-rays.  The patient's secondary area pain is that of the neck with the pain being in the posterior aspect and bilateral. She indicates that the pain is primarily on the right side. She denies any prior cervical spine surgeries but does admit to having had some nerve blocks done. She indicates that they again were done by me, along time ago. She denies any physical therapy or recent x-rays.  The patient's third area of pain is that of the shoulders with the right shoulder being worst on the left. She denies any shoulder surgery as she is not sure as to whether or not she has had any injections in the shoulders, but she indicates that if she did have some they were also done by me. She denies any physical therapy or x-rays of the area.  The next area of pain is described to be that of the upper extremities with the right being worse than the left. In the case of the right upper extremity the pain is described to be going all the way down to the index finger, middle finger, and pinky finger. She indicates having pain, numbness, and weakness. In the case of the left upper extremity the pain goes only to the index finger and middle finger and she also complains of pain, numbness, and weakness. The pain appears to be primarily in the area of the C7 and in the case of the right upper extremity in the area of the C7 and C8.  Next area of pain is described to be that of the lower extremities with the right being worst on the left. In the case of the right lower extremity the pain goes all the way down to the top of the foot following a dermatomal distribution. This pain is described to going to  the big toe following an L5 dermatomal distribution. In the case of the left lower extremity the pain also goes all the way down to the inner portion of the foot in what appears to be an L4 dermatomal distribution.  The next area pain is described to be that of the hips with the right hip being worse than the left. She denies any prior surgeries, physical therapy, or any recent x-rays. She is not sure about any joint injections or nerve blocks but she indicates that if she had any, they were probably done by me.  The patient indicates that when she left our practice she went on to Dr. Rance Muir practice where she received OxyContin and later was switched to Percocet to control her pain. She indicates that she has not been taking any for the past 8 months. She also indicates that at the end she was taking 20 mg 3 times a day. She was also using hot showers to control her pain.  No problems since the last time I saw her would include an irregular heartbeat and she is pending to see a cardiologist at the [facility] in August. She is currently not taking any Blood thinners.  Physical exam today was positive for bilateral sacroiliac joint pain on Patrick's maneuver and right-sided lumbar facet pain on hyperextension and rotation. The patient had no problems with toe walking or heel walking.  Today I took the time to provide the patient  with information regarding my pain practice. The patient was informed that my practice is divided into two sections: an interventional pain management section, as well as a completely separate and distinct medication management section. I explained that I have procedure days for my interventional therapies, and evaluation days for follow-ups and medication management. Because of the amount of documentation required during both, they are kept separated. This means that there is the possibility that she may be scheduled for a procedure on one day, and medication management  the next. I have also informed her that because of staffing and facility limitations, I no longer take patients for medication management only. To illustrate the reasons for this, I gave the patient the example of surgeons, and how inappropriate it would be to refer a patient to his/her care, just to write for the post-surgical antibiotics on a surgery done by a different surgeon.   Because interventional pain management is my board-certified specialty, the patient was informed that joining my practice means that they are open to any and all interventional therapies. I made it clear that this does not mean that they will be forced to have any procedures done. What this means is that I believe interventional therapies to be essential part of the diagnosis and proper management of chronic pain conditions. Therefore, patients not interested in these interventional alternatives will be better served under the care of a different practitioner.  The patient was also made aware of my Comprehensive Pain Management Safety Guidelines where by joining my practice, they limit all of their nerve blocks and joint injections to those done by our practice, for as long as we are retained to manage their care.   Historic Controlled Substance Pharmacotherapy Review  PMP and historical list of controlled substances: Alprazolam 0.5; oxycodone IR 20 mg; tramadol 50 mg; diazepam 5 mg; alprazolam 2 mg; OxyContin 40 mg; hydrocodone suspension; Highest opioid analgesic regimen found: OxyContin 40 mg 2 tablets twice a day (160 mg/day of oxycodone) (240 MME/Day) Most recent opioid analgesic: Oxycodone IR 20 mg 1 tablet by mouth every 6 hours (80 mg/day of oxycodone) (120 MME/Day) (last filled on 12/08/2015) Current opioid analgesics: None Highest recorded MME/day: 240 mg/day MME/day: 0 mg/day Medications: The patient did not bring the medication(s) to the appointment, as requested in our "New Patient  Package" Pharmacodynamics: Desired effects: Analgesia: The patient reports >50% benefit. Reported improvement in function: The patient reports medication allows her to accomplish basic ADLs. Clinically meaningful improvement in function (CMIF): Sustained CMIF goals met Perceived effectiveness: Described as relatively effective, allowing for increase in activities of daily living (ADL) Undesirable effects: Side-effects or Adverse reactions: None reported Historical Monitoring: The patient  reports that she does not use drugs. List of all UDS Test(s): No results found for: MDMA, COCAINSCRNUR, PCPSCRNUR, PCPQUANT, CANNABQUANT, THCU, [place] List of all Serum Drug Screening Test(s):  No results found for: AMPHSCRSER, BARBSCRSER, BENZOSCRSER, COCAINSCRSER, PCPSCRSER, PCPQUANT, THCSCRSER, CANNABQUANT, OPIATESCRSER, OXYSCRSER, PROPOXSCRSER Historical Background Evaluation: [place] PDMP: Six (6) year initial data search conducted. Regular, uninterrupted pattern of monthly opioid refills detected from 11/15/2010 until 12/08/2015.       [place] Department of public safety, offender search: Editor, commissioning Information) Non-contributory Risk Assessment Profile: Aberrant behavior: None observed or detected today Risk factors for fatal opioid overdose: None identified today Fatal overdose hazard ratio (HR): Calculation deferred Non-fatal overdose hazard ratio (HR): Calculation deferred Risk of opioid abuse or dependence: 0.7-3.0% with doses ? 36 MME/day and 6.1-26% with doses ? 120 MME/day. Substance use  disorder (SUD) risk level: Pending results of Medical Psychology Evaluation for SUD Opioid risk tool (ORT) (Total Score): 0  ORT Scoring interpretation table:  Score <3 = Low Risk for SUD  Score between 4-7 = Moderate Risk for SUD  Score >8 = High Risk for Opioid Abuse   PHQ-2 Depression Scale:  Total score: 0  PHQ-2 Scoring interpretation table: (Score and probability of major depressive disorder)  Score 0 = No  depression  Score 1 = 15.4% Probability  Score 2 = 21.1% Probability  Score 3 = 38.4% Probability  Score 4 = 45.5% Probability  Score 5 = 56.4% Probability  Score 6 = 78.6% Probability   PHQ-9 Depression Scale:  Total score: 0  PHQ-9 Scoring interpretation table:  Score 0-4 = No depression  Score 5-9 = Mild depression  Score 10-14 = Moderate depression  Score 15-19 = Moderately severe depression  Score 20-27 = Severe depression (2.4 times higher risk of SUD and 2.89 times higher risk of overuse)   Pharmacologic Plan: Pending ordered tests and/or consults            Initial impression: Pending review of available data and ordered tests.  Meds   Current Meds  Medication Sig  . ALPRAZolam (XANAX) 0.5 MG tablet TAKE ONE TABLET BY MOUTH TWICE DAILY AS NEEDED FOR ANXIETY  . amLODipine (NORVASC) 10 MG tablet Take 10 mg by mouth daily.  Marland Kitchen atorvastatin (LIPITOR) 40 MG tablet Take 1 tablet (40 mg total) by mouth daily.  . benazepril (LOTENSIN) 40 MG tablet Take 1 tablet (40 mg total) by mouth daily.  . cetirizine (ZYRTEC) 10 MG tablet Take 1 tablet (10 mg total) by mouth daily.  . cyclobenzaprine (FLEXERIL) 5 MG tablet Take 1 tablet (5 mg total) by mouth 2 (two) times daily as needed for muscle spasms.  . diclofenac sodium (VOLTAREN) 1 % GEL Apply 2 g topically 4 (four) times daily.  . metoprolol succinate (TOPROL-XL) 100 MG 24 hr tablet TAKE ONE TABLET BY MOUTH EVERY DAY WITH A MEAL  . ranitidine (ZANTAC) 75 MG tablet Take 1 tablet (75 mg total) by mouth 2 (two) times daily.  . sertraline (ZOLOFT) 100 MG tablet Take 1 tablet (100 mg total) by mouth daily.  Marland Kitchen triamcinolone (NASACORT AQ) 55 MCG/ACT AERO nasal inhaler Place 2 sprays into the nose daily.    Imaging Review  Cervical Imaging: Cervical DG 2-3 views:  Results for orders placed in visit on 04/10/13  DG Cervical Spine 2 or 3 views   Narrative * PRIOR REPORT IMPORTED FROM AN EXTERNAL SYSTEM *   CLINICAL DATA:  Neck and upper  extremity and low-back discomfort.   EXAM:  CERVICAL SPINE - 2-3 VIEW   COMPARISON:  None.   FINDINGS:  The cervical vertebral bodies are preserved in height. There is mild  disc space narrowing at C6-7. The prevertebral soft tissue spaces  appear normal. The facet joints exhibit no more than mild  degenerative change. The spinous processes are intact. The odontoid  is intact and the lateral masses of C1 align normally with those of  C2. The observed portions of the 1st and 2nd ribs appear normal.  There are calcifications in the region of the carotid arteries  bilaterally.   IMPRESSION:  There is mild degenerative disc space narrowing at the C6-7 level.  There is no evidence of a compression fracture or other acute  cervical spine abnormality. Given the patient's symptoms, follow-up  MRI may be useful.  Electronically Signed    By: David  Martinique    On: 04/10/2013 14:58       Lumbosacral Imaging: Lumbar MR w/wo contrast:  Results for orders placed in visit on 12/02/04  MR Lumbar Spine W Wo Contrast   Narrative * PRIOR REPORT IMPORTED FROM AN EXTERNAL SYSTEM *   PRIOR REPORT IMPORTED FROM THE [facility] WORKFLOW SYSTEM   REASON FOR EXAM:  increasing low back pain   with RT foot numbness   previous  LSpine surgery  COMMENTS:   PROCEDURE:     MR  - MR LUMBAR SPINE WO/W  - Dec 02 2004  9:40AM   RESULT:        Multisequential and multiplanar imaging of the lumbar spine  was obtained.  Multilevel disc degeneration is present.  There is no  evidence of disc protrusion or spinal stenosis.  No bony lesions are  identified.  There is no evidence of disc enhancement or epidural  scarring.   IMPRESSION:   1.     Multilevel disc degeneration without significant disc protrusion or  spinal stenosis.  2.     No evidence of significant epidural scarring.   Exam is stable from  prior study of 2004.       Lumbar DG 2-3 views:  Results for orders placed in visit on 04/10/13  DG  Lumbar Spine 2-3 Views   Narrative * PRIOR REPORT IMPORTED FROM AN EXTERNAL SYSTEM *   CLINICAL DATA:  Degenerative disc disease low back discomfort and  right lower leg radicular symptoms.   EXAM:  AP and lateral views of the lumbar spine.   COMPARISON:  MRI of the lumbar spine dated December 02, 2004   FINDINGS:  The lumbar vertebral bodies are preserved in height. There is  high-grade disc space narrowing at L4-5 with milder narrowing at  L5-S1. There are anterior posterior endplate osteophytes at these  levels. The patient appears to have undergone laminectomy at L4.  There is no spondylolisthesis. The pedicles appear intact. There is  facet joint degenerative change at L5- S1.   IMPRESSION:  There are degenerative changes of the lumbar spine centered at L4-5  and L5-S1 manifested chiefly by high-grade disc space narrowing at  L4 and milder narrowing at L5. The patient has undergone previous L4  laminectomy.    Electronically Signed    By: David  Martinique    On: 04/10/2013 15:01       Complexity Note: Imaging results reviewed. Results discussed using Layman's terms.               ROS  Cardiovascular History: Abnormal heart rhythm and High blood pressure Pulmonary or Respiratory History: Smoking Neurological History: No reported neurological signs or symptoms such as seizures, abnormal skin sensations, urinary and/or fecal incontinence, being born with an abnormal open spine and/or a tethered spinal cord Review of Past Neurological Studies: No results found for this or any previous visit. Psychological-Psychiatric History: Anxiousness, Depressed and Prone to panicking Gastrointestinal History: Reflux or heatburn Genitourinary History: No reported renal or genitourinary signs or symptoms such as difficulty voiding or producing urine, peeing blood, non-functioning kidney, kidney stones, difficulty emptying the bladder, difficulty controlling the flow of urine, or chronic kidney  disease Hematological History: Brusing easily Endocrine History: No reported endocrine signs or symptoms such as high or low blood sugar, rapid heart rate due to high thyroid levels, obesity or weight gain due to slow thyroid or thyroid disease Rheumatologic History: No  reported rheumatological signs and symptoms such as fatigue, joint pain, tenderness, swelling, redness, heat, stiffness, decreased range of motion, with or without associated rash Musculoskeletal History: Negative for myasthenia gravis, muscular dystrophy, multiple sclerosis or malignant hyperthermia Work History: Retired  Allergies  Ms. Kary is allergic to accupril [quinapril hcl]; avelox [moxifloxacin]; ciprofloxacin; hctz [hydrochlorothiazide]; penicillins; and sulfa antibiotics.  Laboratory Chemistry  Inflammation Markers (CRP: Acute Phase) (ESR: Chronic Phase) No results found for: CRP, ESRSEDRATE               Renal Function Markers Lab Results  Component Value Date   BUN 10 11/10/2016   CREATININE 1.39 (H) 11/10/2016   GFRAA 47 (L) 11/10/2016   GFRNONAA 41 (L) 11/10/2016                 Hepatic Function Markers Lab Results  Component Value Date   AST 24 11/10/2016   ALT 27 11/10/2016   ALBUMIN 4.2 10/20/2016   ALKPHOS 87 10/20/2016                 Electrolytes Lab Results  Component Value Date   NA 138 11/10/2016   K 3.7 11/10/2016   CL 101 11/10/2016   CALCIUM 9.0 11/10/2016                 Neuropathy Markers No results found for: [id number]               Bone Pathology Markers Lab Results  Component Value Date   ALKPHOS 87 10/20/2016   CALCIUM 9.0 11/10/2016                 Coagulation Parameters Lab Results  Component Value Date   PLT 261 10/20/2016                 Cardiovascular Markers Lab Results  Component Value Date   HGB 13.7 10/20/2016   HCT 40.3 10/20/2016                 Note: Lab results reviewed.  PFSH  Drug: Ms. Sollers  reports that she does not use  drugs. Alcohol:  reports that she does not drink alcohol. Tobacco:  reports that she has been smoking Cigarettes.  She has been smoking about 2.00 packs per day. She has never used smokeless tobacco. Medical:  has a past medical history of Chronic kidney disease; Depression; Failed back syndrome, lumbar; Hyperlipidemia; Hypertension; Osteoporosis; and Weight loss. Family: family history includes Cancer in her father and maternal aunt; Dementia in her mother; Hypertension in her mother.  Past Surgical History:  Procedure Laterality Date  . ABDOMINAL HYSTERECTOMY    . CESAREAN SECTION    . MOUTH SURGERY    . SPINE SURGERY     Active Ambulatory Problems    Diagnosis Date Noted  . Postlaminectomy syndrome, lumbar region 11/17/2014  . Depression   . Hyperlipidemia   . Hypertension   . Acid reflux 11/19/2014  . Elevated TSH 07/08/2015  . Long term prescription benzodiazepine use 11/28/2016  . Chronic pain syndrome 11/28/2016  . Long term (current) use of opiate analgesic 11/28/2016  . Long term prescription opiate use 11/28/2016  . Opiate use 11/28/2016  . Failed back surgical syndrome 11/28/2016  . Chronic low back pain (Location of Primary Source of Pain) (Bilateral) (R>L) 11/28/2016  . Lumbar radiculitis (Right) 11/28/2016  . Chronic lower extremity pain (Bilateral) (R>L) 11/28/2016  . Chronic lumbar radicular pain (Bilateral) (R>L) 11/28/2016  . Chronic neck  pain (Location of Secondary source of pain) (Bilateral) (R>L) 11/28/2016  . DDD (degenerative disc disease), lumbar 11/28/2016  . DDD (degenerative disc disease), cervical 11/28/2016  . Disturbance of skin sensation 11/29/2016  . Chronic shoulder pain (Location of Tertiary source of pain) (Bilateral) (R>L) 11/29/2016  . Chronic upper extremity pain (Bilateral) (R>L) 11/29/2016  . Chronic cervical radicular pain (Bilateral) (R>L) 11/29/2016  . Chronic hip pain (Bilateral) (R>L) 11/29/2016  . Osteoarthritis of hip (Bilateral)  11/29/2016  . Chronic sacroiliac joint pain (Bilateral) 11/29/2016  . Lumbar facet syndrome (Right) 11/29/2016   Resolved Ambulatory Problems    Diagnosis Date Noted  . Rectal bleeding 11/24/2014  . Right upper quadrant pain 11/24/2014  . Fecal impaction of colon ([place]) 11/28/2014  . Constipation due to opioid therapy 04/06/2015  . Dysuria 07/08/2015   Past Medical History:  Diagnosis Date  . Chronic kidney disease   . Depression   . Failed back syndrome, lumbar   . Hyperlipidemia   . Hypertension   . Osteoporosis   . Weight loss    Constitutional Exam  General appearance: Well nourished, well developed, and well hydrated. In no apparent acute distress Vitals:   11/29/16 0818  BP: 106/75  Pulse: 71  Resp: 16  Temp: 97.8 F (36.6 C)  TempSrc: Oral  SpO2: 100%  Weight: 130 lb (59 kg)  Height: _0  (1.702 m)   BMI Assessment: Estimated body mass index is 20.36 kg/m as calculated from the following:   Height as of this encounter: _1  (1.702 m).   Weight as of this encounter: 130 lb (59 kg).  BMI interpretation table: BMI level Category Range association with higher incidence of chronic pain  <18 kg/m2 Underweight   18.5-24.9 kg/m2 Ideal body weight   25-29.9 kg/m2 Overweight Increased incidence by 20%  30-34.9 kg/m2 Obese (Class I) Increased incidence by 68%  35-39.9 kg/m2 Severe obesity (Class II) Increased incidence by 136%  >40 kg/m2 Extreme obesity (Class III) Increased incidence by 254%   BMI Readings from Last 4 Encounters:  11/29/16 20.36 kg/m  11/10/16 20.36 kg/m  10/20/16 20.20 kg/m  09/27/16 20.39 kg/m   Wt Readings from Last 4 Encounters:  11/29/16 130 lb (59 kg)  11/10/16 130 lb (59 kg)  10/20/16 129 lb (58.5 kg)  09/27/16 130 lb 3.2 oz (59.1 kg)  Psych/Mental status: Alert, oriented x 3 (person, place, & time)       Eyes: PERLA Respiratory: No evidence of acute respiratory distress  Cervical Spine Exam  Inspection: No masses, redness,  or swelling Alignment: Symmetrical Functional ROM: Unrestricted ROM      Stability: No instability detected Muscle strength & Tone: Functionally intact Sensory: Unimpaired Palpation: No palpable anomalies              Upper Extremity (UE) Exam    Side: Right upper extremity  Side: Left upper extremity  Inspection: No masses, redness, swelling, or asymmetry. No contractures  Inspection: No masses, redness, swelling, or asymmetry. No contractures  Functional ROM: Unrestricted ROM          Functional ROM: Unrestricted ROM          Muscle strength & Tone: Functionally intact  Muscle strength & Tone: Functionally intact  Sensory: Unimpaired  Sensory: Unimpaired  Palpation: No palpable anomalies              Palpation: No palpable anomalies              Specialized Test(s): Deferred  Specialized Test(s): Deferred          Thoracic Spine Exam  Inspection: No masses, redness, or swelling Alignment: Symmetrical Functional ROM: Unrestricted ROM Stability: No instability detected Sensory: Unimpaired Muscle strength & Tone: No palpable anomalies  Lumbar Spine Exam  Inspection: No masses, redness, or swelling Alignment: Symmetrical Functional ROM: Unrestricted ROM      Stability: No instability detected Muscle strength & Tone: Functionally intact Sensory: Unimpaired Palpation: No palpable anomalies       Provocative Tests: Lumbar Hyperextension and rotation test: evaluation deferred today       Patrick's Maneuver: evaluation deferred today                    Gait & Posture Assessment  Ambulation: Unassisted Gait: Relatively normal for age and body habitus Posture: WNL   Lower Extremity Exam    Side: Right lower extremity  Side: Left lower extremity  Inspection: No masses, redness, swelling, or asymmetry. No contractures  Inspection: No masses, redness, swelling, or asymmetry. No contractures  Functional ROM: Unrestricted ROM          Functional ROM: Unrestricted ROM           Muscle strength & Tone: Functionally intact  Muscle strength & Tone: Functionally intact  Sensory: Unimpaired  Sensory: Unimpaired  Palpation: No palpable anomalies  Palpation: No palpable anomalies   Assessment  Primary Diagnosis & Pertinent Problem List: The primary encounter diagnosis was Failed back surgical syndrome. Diagnoses of Postlaminectomy syndrome, lumbar region, Chronic bilateral low back pain with right-sided sciatica, Lumbar radiculitis (Right), Chronic pain of right lower extremity, Chronic radicular pain of lower extremity (Right), DDD (degenerative disc disease), lumbar, DDD (degenerative disc disease), cervical, Chronic neck pain, Chronic shoulder pain (Location of Tertiary source of pain) (Bilateral) (R>L), Chronic pain of both upper extremities, Chronic cervical radicular pain (Bilateral) (R>L), Chronic hip pain (Bilateral) (R>L), Osteoarthritis of hip (Bilateral), Chronic sacroiliac joint pain (Bilateral), Lumbar facet syndrome (Right), Chronic pain syndrome, Long term prescription benzodiazepine use, Long term (current) use of opiate analgesic, Long term prescription opiate use, Opiate use, and Disturbance of skin sensation were also pertinent to this visit.  Visit Diagnosis (New problems to examiner): 1. Failed back surgical syndrome   2. Postlaminectomy syndrome, lumbar region   3. Chronic bilateral low back pain with right-sided sciatica   4. Lumbar radiculitis (Right)   5. Chronic pain of right lower extremity   6. Chronic radicular pain of lower extremity (Right)   7. DDD (degenerative disc disease), lumbar   8. DDD (degenerative disc disease), cervical   9. Chronic neck pain   10. Chronic shoulder pain (Location of Tertiary source of pain) (Bilateral) (R>L)   11. Chronic pain of both upper extremities   12. Chronic cervical radicular pain (Bilateral) (R>L)   13. Chronic hip pain (Bilateral) (R>L)   14. Osteoarthritis of hip (Bilateral)   15. Chronic sacroiliac  joint pain (Bilateral)   16. Lumbar facet syndrome (Right)   17. Chronic pain syndrome   18. Long term prescription benzodiazepine use   19. Long term (current) use of opiate analgesic   20. Long term prescription opiate use   21. Opiate use   22. Disturbance of skin sensation    Plan of Care (Initial workup plan)  Note: Please be advised that as per protocol, today's visit has been an evaluation only. We have not taken over the patient's controlled substance management.  Problem-specific plan: No problem-specific Assessment & Plan notes  found for this encounter.  Ordered Lab-work, Procedure(s), Referral(s), & Consult(s): Orders Placed This Encounter  Procedures  . DG Lumbar Spine Complete W/Bend  . MR LUMBAR SPINE WO CONTRAST  . DG Cervical Spine Complete  . DG HIP UNILAT W OR W/O PELVIS 2-3 VIEWS LEFT  . DG HIP UNILAT W OR W/O PELVIS 2-3 VIEWS RIGHT  . DG Si Joints  . DG Shoulder Left  . DG Shoulder Right  . C-reactive protein  . Sedimentation rate  . Magnesium  . 25-Hydroxyvitamin D Lcms D2+D3  . Vitamin B12  . Ambulatory referral to Psychology  . NCV with EMG(electromyography)   Pharmacotherapy (current): Medications ordered:  No orders of the defined types were placed in this encounter.  Medications administered during this visit: Ms. Hoar had no medications administered during this visit.   Pharmacological management options:  Opioid Analgesics: The patient was informed that there is no guarantee that she would be a candidate for opioid analgesics. The decision will be made following CDC guidelines. This decision will be based on the results of diagnostic studies, as well as Ms. Stanforth's risk profile.   Membrane stabilizer: To be determined at a later time  Muscle relaxant: To be determined at a later time  NSAID: To be determined at a later time  Other analgesic(s): To be determined at a later time   Interventional management options: Ms. Walski was  informed that there is no guarantee that she would be a candidate for interventional therapies. The decision will be based on the results of diagnostic studies, as well as Ms. Woulfe's risk profile.  Procedure(s) under consideration:  Diagnostic bilateral lumbar facet block  Possible bilateral lumbar facet RFA  Diagnostic bilateral sacroiliac joint block Possible bilateral sacroiliac joint RFA Diagnostic bilateral cervical facet block  Possible bilateral cervical facet RFA  Diagnostic bilateral intra-articular shoulder joint injection  Diagnostic bilateral suprascapular nerve block  Possible bilateral suprascapular nerve RFA  Diagnostic right-sided cervical epidural steroid injection  Diagnostic right-sided L4-5 lumbar epidural steroid injection  Diagnostic right-sided L5-S1 transforaminal epidural steroid injection  Diagnostic right-sided L5 selective nerve block  Possible right-sided L5 DRG RFA  Diagnostic left-sided L4-5 transforaminal epidural steroid injection  Diagnostic left sided L4 selective nerve block  Possible left-sided L4 DRG RFA  Diagnostic right-sided: Epidural steroid injection + diagnostic epidurogram  Possible Racz procedure   Diagnostic bilateral intra-articular hip joint injection  Diagnostic bilateral femoral nerve + obturator nerve block  Possible bilateral terminal nerve + obturator nerve RFA    Provider-requested follow-up: Return for 2nd Visit, w/ MD, after [facility] eval.  Future Appointments Date Time Provider [place]  01/11/2017 3:30 PM Guadalupe Maple, MD [facility] None  01/12/2017 9:20 AM Wellington Hampshire, MD [id number]    Primary Care Physician: Guadalupe Maple, MD Location: [facility] Note by: Kathlen Brunswick. Dossie Arbour, M.D, DABA, DABAPM, DABPM, DABIPP, FIPP Date: 11/29/2016; Time: 1:00 PM  Patient Instructions     ____________________________________________________________________________________________  Appointment Policy Summary  It is our goal and responsibility to provide the medical community with assistance in the evaluation and management of patients with chronic pain. Unfortunately our resources are limited. Because we do not have an unlimited amount of time, or available appointments, we are required to closely monitor and manage their use. The following rules exist to maximize their use:  Patient's responsibilities: 1. Punctuality:  At what time should I arrive? You should be physically present in our office 30 minutes before your scheduled  appointment. Your scheduled appointment is with your assigned healthcare provider. However, it takes 5-10 minutes to be "checked-in", and another 15 minutes for the nurses to do the admission. If you arrive to our office at the time you were given for your appointment, you will end up being at least 20-25 minutes late to your appointment with the provider. 2. Tardiness:  What happens if I arrive only a few minutes after my scheduled appointment time? You will need to reschedule your appointment. The cutoff is your appointment time. This is why it is so important that you arrive at least 30 minutes before that appointment. If you have an appointment scheduled for 10:00 AM and you arrive at 10:01, you will be required to reschedule your appointment.  3. Plan ahead:  Always assume that you will encounter traffic on your way in. Plan for it. If you are dependent on a driver, make sure they understand these rules and the need to arrive early. 4. Other appointments and responsibilities:  Avoid scheduling any other appointments before or after your pain clinic appointments.  5. Be prepared:  Write down everything that you need to discuss with your healthcare provider and give this information to the admitting nurse. Write down the medications that you will need  refilled. Bring your pills and bottles (even the empty ones), to all of your appointments, except for those where a procedure is scheduled. 6. No children or pets:  Find someone to take care of them. It is not appropriate to bring them in. 7. Scheduling changes:  We request "advanced notification" of any changes or cancellations. 8. Advanced notification:  Defined as a time period of more than 24 hours prior to the originally scheduled appointment. This allows for the appointment to be offered to other patients. 9. Rescheduling:  When a visit is rescheduled, it will require the cancellation of the original appointment. For this reason they both fall within the category of "Cancellations".  10. Cancellations:  They require advanced notification. Any cancellation less than 24 hours before the  appointment will be recorded as a "No Show". 11. No Show:  Defined as an unkept appointment where the patient failed to notify or declare to the practice their intention or inability to keep the appointment.  Corrective process for repeat offenders:  1. Tardiness: Three (3) episodes of rescheduling due to late arrivals will be recorded as one (1) "No Show". 2. Cancellation or reschedule: Three (3) cancellations or rescheduling will be recorded as one (1) "No Show". 3. "No Shows": Three (3) "No Shows" within a 12 month period will result in discharge from the practice.  ____________________________________________________________________________________________  ____________________________________________________________________________________________  Pain Scale  Introduction: The pain score used by this practice is the Verbal Numerical Rating Scale (VNRS-11). This is an 11-point scale. It is for adults and children 10 years or older. There are significant differences in how the pain score is reported, used, and applied. Forget everything you learned in the past and learn this scoring  system.  General Information: The scale should reflect your current level of pain. Unless you are specifically asked for the level of your worst pain, or your average pain. If you are asked for one of these two, then it should be understood that it is over the past 24 hours.  Basic Activities of Daily Living (ADL): Personal hygiene, dressing, eating, transferring, and using restroom.  Instructions: Most patients tend to report their level of pain as a combination of two factors, their physical pain  and their psychosocial pain. This last one is also known as "suffering" and it is reflection of how physical pain affects you socially and psychologically. From now on, report them separately. From this point on, when asked to report your pain level, report only your physical pain. Use the following table for reference.  Pain Clinic Pain Levels (0-5/10)  Pain Level Score  Description  No Pain 0   Mild pain 1 Nagging, annoying, but does not interfere with basic activities of daily living (ADL). Patients are able to eat, bathe, get dressed, toileting (being able to get on and off the toilet and perform personal hygiene functions), transfer (move in and out of bed or a chair without assistance), and maintain continence (able to control bladder and bowel functions). Blood pressure and heart rate are unaffected. A normal heart rate for a healthy adult ranges from 60 to 100 bpm (beats per minute).   Mild to moderate pain 2 Noticeable and distracting. Impossible to hide from other people. More frequent flare-ups. Still possible to adapt and function close to normal. It can be very annoying and may have occasional stronger flare-ups. With discipline, patients may get used to it and adapt.   Moderate pain 3 Interferes significantly with activities of daily living (ADL). It becomes difficult to feed, bathe, get dressed, get on and off the toilet or to perform personal hygiene functions. Difficult to get in and out of  bed or a chair without assistance. Very distracting. With effort, it can be ignored when deeply involved in activities.   Moderately severe pain 4 Impossible to ignore for more than a few minutes. With effort, patients may still be able to manage work or participate in some social activities. Very difficult to concentrate. Signs of autonomic nervous system discharge are evident: dilated pupils (mydriasis); mild sweating (diaphoresis); sleep interference. Heart rate becomes elevated (>115 bpm). Diastolic blood pressure (lower number) rises above 100 mmHg. Patients find relief in laying down and not moving.   Severe pain 5 Intense and extremely unpleasant. Associated with frowning face and frequent crying. Pain overwhelms the senses.  Ability to do any activity or maintain social relationships becomes significantly limited. Conversation becomes difficult. Pacing back and forth is common, as getting into a comfortable position is nearly impossible. Pain wakes you up from deep sleep. Physical signs will be obvious: pupillary dilation; increased sweating; goosebumps; brisk reflexes; cold, clammy hands and feet; nausea, vomiting or dry heaves; loss of appetite; significant sleep disturbance with inability to fall asleep or to remain asleep. When persistent, significant weight loss is observed due to the complete loss of appetite and sleep deprivation.  Blood pressure and heart rate becomes significantly elevated. Caution: If elevated blood pressure triggers a pounding headache associated with blurred vision, then the patient should immediately seek attention at an urgent or emergency care unit, as these may be signs of an impending stroke.    Emergency Department Pain Levels (6-10/10)  Emergency Room Pain 6 Severely limiting. Requires emergency care and should not be seen or managed at an outpatient pain management facility. Communication becomes difficult and requires great effort. Assistance to reach the  emergency department may be required. Facial flushing and profuse sweating along with potentially dangerous increases in heart rate and blood pressure will be evident.   Distressing pain 7 Self-care is very difficult. Assistance is required to transport, or use restroom. Assistance to reach the emergency department will be required. Tasks requiring coordination, such as bathing and getting dressed become  very difficult.   Disabling pain 8 Self-care is no longer possible. At this level, pain is disabling. The individual is unable to do even the most "basic" activities such as walking, eating, bathing, dressing, transferring to a bed, or toileting. Fine motor skills are lost. It is difficult to think clearly.   Incapacitating pain 9 Pain becomes incapacitating. Thought processing is no longer possible. Difficult to remember your own name. Control of movement and coordination are lost.   The worst pain imaginable 10 At this level, most patients pass out from pain. When this level is reached, collapse of the autonomic nervous system occurs, leading to a sudden drop in blood pressure and heart rate. This in turn results in a temporary and dramatic drop in blood flow to the brain, leading to a loss of consciousness. Fainting is one of the body's self defense mechanisms. Passing out puts the brain in a calmed state and causes it to shut down for a while, in order to begin the healing process.    Summary: 1. Refer to this scale when providing [place] with your pain level. 2. Be accurate and careful when reporting your pain level. This will help with your care. 3. Over-reporting your pain level will lead to loss of credibility. 4. Even a level of 1/10 means that there is pain and will be treated at our facility. 5. High, inaccurate reporting will be documented as "Symptom Exaggeration", leading to loss of credibility and suspicions of possible secondary gains such as obtaining more narcotics, or wanting to appear  disabled, for fraudulent reasons. 6. Only pain levels of 5 or below will be seen at our facility. 7. Pain levels of 6 and above will be sent to the Emergency Department and the appointment cancelled.  Please get your x-rays done as soon as possible. Someone will call you to schedule the medical/psychology consult and the nerve conduction stucy. ____________________________________________________________________________________________

## 2016-11-29 ENCOUNTER — Other Ambulatory Visit: Payer: Self-pay | Admitting: Pain Medicine

## 2016-11-29 ENCOUNTER — Ambulatory Visit: Payer: Medicare Other | Attending: Pain Medicine | Admitting: Pain Medicine

## 2016-11-29 ENCOUNTER — Encounter: Payer: Self-pay | Admitting: Pain Medicine

## 2016-11-29 ENCOUNTER — Ambulatory Visit
Admission: RE | Admit: 2016-11-29 | Discharge: 2016-11-29 | Disposition: A | Discharge status: Home / Self Care | Payer: Medicare Other | Source: Ambulatory Visit | Attending: Pain Medicine | Admitting: Pain Medicine

## 2016-11-29 ENCOUNTER — Encounter (INDEPENDENT_AMBULATORY_CARE_PROVIDER_SITE_OTHER): Payer: Self-pay

## 2016-11-29 ENCOUNTER — Telehealth: Payer: Self-pay

## 2016-11-29 VITALS — BP 106/75 | HR 71 | Temp 97.8°F | Resp 16 | Ht 67.0 in | Wt 130.0 lb

## 2016-11-29 DIAGNOSIS — M4186 Other forms of scoliosis, lumbar region: Secondary | ICD-10-CM | POA: Diagnosis not present

## 2016-11-29 DIAGNOSIS — M5010 Cervical disc disorder with radiculopathy, unspecified cervical region: Secondary | ICD-10-CM | POA: Diagnosis not present

## 2016-11-29 DIAGNOSIS — M542 Cervicalgia: Secondary | ICD-10-CM

## 2016-11-29 DIAGNOSIS — M5136 Other intervertebral disc degeneration, lumbar region: Secondary | ICD-10-CM | POA: Insufficient documentation

## 2016-11-29 DIAGNOSIS — Z79899 Other long term (current) drug therapy: Secondary | ICD-10-CM | POA: Diagnosis not present

## 2016-11-29 DIAGNOSIS — M47816 Spondylosis without myelopathy or radiculopathy, lumbar region: Secondary | ICD-10-CM | POA: Diagnosis not present

## 2016-11-29 DIAGNOSIS — M25559 Pain in unspecified hip: Secondary | ICD-10-CM

## 2016-11-29 DIAGNOSIS — M5116 Intervertebral disc disorders with radiculopathy, lumbar region: Secondary | ICD-10-CM | POA: Diagnosis not present

## 2016-11-29 DIAGNOSIS — Z9889 Other specified postprocedural states: Secondary | ICD-10-CM | POA: Diagnosis not present

## 2016-11-29 DIAGNOSIS — M79604 Pain in right leg: Secondary | ICD-10-CM

## 2016-11-29 DIAGNOSIS — M5410 Radiculopathy, site unspecified: Secondary | ICD-10-CM | POA: Diagnosis not present

## 2016-11-29 DIAGNOSIS — M79602 Pain in left arm: Secondary | ICD-10-CM

## 2016-11-29 DIAGNOSIS — I129 Hypertensive chronic kidney disease with stage 1 through stage 4 chronic kidney disease, or unspecified chronic kidney disease: Secondary | ICD-10-CM | POA: Insufficient documentation

## 2016-11-29 DIAGNOSIS — K219 Gastro-esophageal reflux disease without esophagitis: Secondary | ICD-10-CM | POA: Diagnosis not present

## 2016-11-29 DIAGNOSIS — M5030 Other cervical disc degeneration, unspecified cervical region: Secondary | ICD-10-CM

## 2016-11-29 DIAGNOSIS — G894 Chronic pain syndrome: Secondary | ICD-10-CM | POA: Diagnosis not present

## 2016-11-29 DIAGNOSIS — F329 Major depressive disorder, single episode, unspecified: Secondary | ICD-10-CM | POA: Insufficient documentation

## 2016-11-29 DIAGNOSIS — Z8249 Family history of ischemic heart disease and other diseases of the circulatory system: Secondary | ICD-10-CM | POA: Diagnosis not present

## 2016-11-29 DIAGNOSIS — M25511 Pain in right shoulder: Secondary | ICD-10-CM

## 2016-11-29 DIAGNOSIS — M961 Postlaminectomy syndrome, not elsewhere classified: Secondary | ICD-10-CM | POA: Diagnosis not present

## 2016-11-29 DIAGNOSIS — M25512 Pain in left shoulder: Secondary | ICD-10-CM | POA: Diagnosis not present

## 2016-11-29 DIAGNOSIS — I708 Atherosclerosis of other arteries: Secondary | ICD-10-CM | POA: Insufficient documentation

## 2016-11-29 DIAGNOSIS — Z880 Allergy status to penicillin: Secondary | ICD-10-CM | POA: Insufficient documentation

## 2016-11-29 DIAGNOSIS — E785 Hyperlipidemia, unspecified: Secondary | ICD-10-CM | POA: Diagnosis not present

## 2016-11-29 DIAGNOSIS — M160 Bilateral primary osteoarthritis of hip: Secondary | ICD-10-CM | POA: Insufficient documentation

## 2016-11-29 DIAGNOSIS — F1190 Opioid use, unspecified, uncomplicated: Secondary | ICD-10-CM | POA: Diagnosis not present

## 2016-11-29 DIAGNOSIS — Z888 Allergy status to other drugs, medicaments and biological substances status: Secondary | ICD-10-CM | POA: Insufficient documentation

## 2016-11-29 DIAGNOSIS — G8929 Other chronic pain: Secondary | ICD-10-CM | POA: Diagnosis not present

## 2016-11-29 DIAGNOSIS — M533 Sacrococcygeal disorders, not elsewhere classified: Secondary | ICD-10-CM | POA: Insufficient documentation

## 2016-11-29 DIAGNOSIS — M5412 Radiculopathy, cervical region: Secondary | ICD-10-CM

## 2016-11-29 DIAGNOSIS — N189 Chronic kidney disease, unspecified: Secondary | ICD-10-CM | POA: Diagnosis not present

## 2016-11-29 DIAGNOSIS — M5416 Radiculopathy, lumbar region: Secondary | ICD-10-CM

## 2016-11-29 DIAGNOSIS — M5441 Lumbago with sciatica, right side: Secondary | ICD-10-CM | POA: Diagnosis not present

## 2016-11-29 DIAGNOSIS — Z882 Allergy status to sulfonamides status: Secondary | ICD-10-CM | POA: Insufficient documentation

## 2016-11-29 DIAGNOSIS — M1612 Unilateral primary osteoarthritis, left hip: Secondary | ICD-10-CM | POA: Diagnosis not present

## 2016-11-29 DIAGNOSIS — Z881 Allergy status to other antibiotic agents status: Secondary | ICD-10-CM | POA: Diagnosis not present

## 2016-11-29 DIAGNOSIS — F17210 Nicotine dependence, cigarettes, uncomplicated: Secondary | ICD-10-CM | POA: Diagnosis not present

## 2016-11-29 DIAGNOSIS — R946 Abnormal results of thyroid function studies: Secondary | ICD-10-CM | POA: Diagnosis not present

## 2016-11-29 DIAGNOSIS — M4696 Unspecified inflammatory spondylopathy, lumbar region: Secondary | ICD-10-CM

## 2016-11-29 DIAGNOSIS — R209 Unspecified disturbances of skin sensation: Secondary | ICD-10-CM | POA: Insufficient documentation

## 2016-11-29 DIAGNOSIS — M488X6 Other specified spondylopathies, lumbar region: Secondary | ICD-10-CM | POA: Insufficient documentation

## 2016-11-29 DIAGNOSIS — M545 Low back pain: Secondary | ICD-10-CM | POA: Diagnosis present

## 2016-11-29 DIAGNOSIS — M810 Age-related osteoporosis without current pathological fracture: Secondary | ICD-10-CM | POA: Diagnosis not present

## 2016-11-29 DIAGNOSIS — M79601 Pain in right arm: Secondary | ICD-10-CM

## 2016-11-29 DIAGNOSIS — Z79891 Long term (current) use of opiate analgesic: Secondary | ICD-10-CM | POA: Diagnosis not present

## 2016-11-29 NOTE — Progress Notes (Signed)
Safety precautions to be maintained throughout the outpatient stay will include: orient to surroundings, keep bed in low position, maintain call bell within reach at all times, provide assistance with transfer out of bed and ambulation.  

## 2016-11-29 NOTE — Telephone Encounter (Signed)
Faxed pt information to Dr Tollie Pizza for psych eval sud

## 2016-11-29 NOTE — Patient Instructions (Addendum)
____________________________________________________________________________________________  Appointment Policy Summary  It is our goal and responsibility to provide the medical community with assistance in the evaluation and management of patients with chronic pain. Unfortunately our resources are limited. Because we do not have an unlimited amount of time, or available appointments, we are required to closely monitor and manage their use. The following rules exist to maximize their use:  Patient's responsibilities: 1. Punctuality:  At what time should I arrive? You should be physically present in our office 30 minutes before your scheduled appointment. Your scheduled appointment is with your assigned healthcare provider. However, it takes 5-10 minutes to be "checked-in", and another 15 minutes for the nurses to do the admission. If you arrive to our office at the time you were given for your appointment, you will end up being at least 20-25 minutes late to your appointment with the provider. 2. Tardiness:  What happens if I arrive only a few minutes after my scheduled appointment time? You will need to reschedule your appointment. The cutoff is your appointment time. This is why it is so important that you arrive at least 30 minutes before that appointment. If you have an appointment scheduled for 10:00 AM and you arrive at 10:01, you will be required to reschedule your appointment.  3. Plan ahead:  Always assume that you will encounter traffic on your way in. Plan for it. If you are dependent on a driver, make sure they understand these rules and the need to arrive early. 4. Other appointments and responsibilities:  Avoid scheduling any other appointments before or after your pain clinic appointments.  5. Be prepared:  Write down everything that you need to discuss with your healthcare provider and give this information to the admitting nurse. Write down the medications that you will need  refilled. Bring your pills and bottles (even the empty ones), to all of your appointments, except for those where a procedure is scheduled. 6. No children or pets:  Find someone to take care of them. It is not appropriate to bring them in. 7. Scheduling changes:  We request "advanced notification" of any changes or cancellations. 8. Advanced notification:  Defined as a time period of more than 24 hours prior to the originally scheduled appointment. This allows for the appointment to be offered to other patients. 9. Rescheduling:  When a visit is rescheduled, it will require the cancellation of the original appointment. For this reason they both fall within the category of "Cancellations".  10. Cancellations:  They require advanced notification. Any cancellation less than 24 hours before the  appointment will be recorded as a "No Show". 11. No Show:  Defined as an unkept appointment where the patient failed to notify or declare to the practice their intention or inability to keep the appointment.  Corrective process for repeat offenders:  1. Tardiness: Three (3) episodes of rescheduling due to late arrivals will be recorded as one (1) "No Show". 2. Cancellation or reschedule: Three (3) cancellations or rescheduling will be recorded as one (1) "No Show". 3. "No Shows": Three (3) "No Shows" within a 12 month period will result in discharge from the practice.  ____________________________________________________________________________________________  ____________________________________________________________________________________________  Pain Scale  Introduction: The pain score used by this practice is the Verbal Numerical Rating Scale (VNRS-11). This is an 11-point scale. It is for adults and children 10 years or older. There are significant differences in how the pain score is reported, used, and applied. Forget everything you learned in the past and  learn this scoring  system.  General Information: The scale should reflect your current level of pain. Unless you are specifically asked for the level of your worst pain, or your average pain. If you are asked for one of these two, then it should be understood that it is over the past 24 hours.  Basic Activities of Daily Living (ADL): Personal hygiene, dressing, eating, transferring, and using restroom.  Instructions: Most patients tend to report their level of pain as a combination of two factors, their physical pain and their psychosocial pain. This last one is also known as "suffering" and it is reflection of how physical pain affects you socially and psychologically. From now on, report them separately. From this point on, when asked to report your pain level, report only your physical pain. Use the following table for reference.  Pain Clinic Pain Levels (0-5/10)  Pain Level Score  Description  No Pain 0   Mild pain 1 Nagging, annoying, but does not interfere with basic activities of daily living (ADL). Patients are able to eat, bathe, get dressed, toileting (being able to get on and off the toilet and perform personal hygiene functions), transfer (move in and out of bed or a chair without assistance), and maintain continence (able to control bladder and bowel functions). Blood pressure and heart rate are unaffected. A normal heart rate for a healthy adult ranges from 60 to 100 bpm (beats per minute).   Mild to moderate pain 2 Noticeable and distracting. Impossible to hide from other people. More frequent flare-ups. Still possible to adapt and function close to normal. It can be very annoying and may have occasional stronger flare-ups. With discipline, patients may get used to it and adapt.   Moderate pain 3 Interferes significantly with activities of daily living (ADL). It becomes difficult to feed, bathe, get dressed, get on and off the toilet or to perform personal hygiene functions. Difficult to get in and out of  bed or a chair without assistance. Very distracting. With effort, it can be ignored when deeply involved in activities.   Moderately severe pain 4 Impossible to ignore for more than a few minutes. With effort, patients may still be able to manage work or participate in some social activities. Very difficult to concentrate. Signs of autonomic nervous system discharge are evident: dilated pupils (mydriasis); mild sweating (diaphoresis); sleep interference. Heart rate becomes elevated (>115 bpm). Diastolic blood pressure (lower number) rises above 100 mmHg. Patients find relief in laying down and not moving.   Severe pain 5 Intense and extremely unpleasant. Associated with frowning face and frequent crying. Pain overwhelms the senses.  Ability to do any activity or maintain social relationships becomes significantly limited. Conversation becomes difficult. Pacing back and forth is common, as getting into a comfortable position is nearly impossible. Pain wakes you up from deep sleep. Physical signs will be obvious: pupillary dilation; increased sweating; goosebumps; brisk reflexes; cold, clammy hands and feet; nausea, vomiting or dry heaves; loss of appetite; significant sleep disturbance with inability to fall asleep or to remain asleep. When persistent, significant weight loss is observed due to the complete loss of appetite and sleep deprivation.  Blood pressure and heart rate becomes significantly elevated. Caution: If elevated blood pressure triggers a pounding headache associated with blurred vision, then the patient should immediately seek attention at an urgent or emergency care unit, as these may be signs of an impending stroke.    Emergency Department Pain Levels (6-10/10)  Emergency Room Pain 6   Severely limiting. Requires emergency care and should not be seen or managed at an outpatient pain management facility. Communication becomes difficult and requires great effort. Assistance to reach the  emergency department may be required. Facial flushing and profuse sweating along with potentially dangerous increases in heart rate and blood pressure will be evident.   Distressing pain 7 Self-care is very difficult. Assistance is required to transport, or use restroom. Assistance to reach the emergency department will be required. Tasks requiring coordination, such as bathing and getting dressed become very difficult.   Disabling pain 8 Self-care is no longer possible. At this level, pain is disabling. The individual is unable to do even the most "basic" activities such as walking, eating, bathing, dressing, transferring to a bed, or toileting. Fine motor skills are lost. It is difficult to think clearly.   Incapacitating pain 9 Pain becomes incapacitating. Thought processing is no longer possible. Difficult to remember your own name. Control of movement and coordination are lost.   The worst pain imaginable 10 At this level, most patients pass out from pain. When this level is reached, collapse of the autonomic nervous system occurs, leading to a sudden drop in blood pressure and heart rate. This in turn results in a temporary and dramatic drop in blood flow to the brain, leading to a loss of consciousness. Fainting is one of the body's self defense mechanisms. Passing out puts the brain in a calmed state and causes it to shut down for a while, in order to begin the healing process.    Summary: 1. Refer to this scale when providing [place] with your pain level. 2. Be accurate and careful when reporting your pain level. This will help with your care. 3. Over-reporting your pain level will lead to loss of credibility. 4. Even a level of 1/10 means that there is pain and will be treated at our facility. 5. High, inaccurate reporting will be documented as "Symptom Exaggeration", leading to loss of credibility and suspicions of possible secondary gains such as obtaining more narcotics, or wanting to appear  disabled, for fraudulent reasons. 6. Only pain levels of 5 or below will be seen at our facility. 7. Pain levels of 6 and above will be sent to the Emergency Department and the appointment cancelled.  Please get your x-rays done as soon as possible. Someone will call you to schedule the medical/psychology consult and the nerve conduction stucy. ____________________________________________________________________________________________

## 2016-12-04 LAB — MAGNESIUM: Magnesium: 2 mg/dL (ref 1.6–2.3)

## 2016-12-04 LAB — BASIC METABOLIC PANEL
BUN/Creatinine Ratio: 7 — ABNORMAL LOW (ref 12–28)
BUN: 8 mg/dL (ref 8–27)
CALCIUM: 9 mg/dL (ref 8.7–10.3)
CHLORIDE: 101 mmol/L (ref 96–106)
CO2: 23 mmol/L (ref 20–29)
CREATININE: 1.12 mg/dL — AB (ref 0.57–1.00)
GFR calc Af Amer: 61 mL/min/{1.73_m2} (ref >59)
GFR calc non Af Amer: 53 mL/min/{1.73_m2} — ABNORMAL LOW (ref >59)
GLUCOSE: 104 mg/dL — AB (ref 65–99)
Potassium: 4 mmol/L (ref 3.5–5.2)
Sodium: 141 mmol/L (ref 134–144)

## 2016-12-04 LAB — 25-HYDROXYVITAMIN D LCMS D2+D3
25-HYDROXY, VITAMIN D-3: 10 ng/mL
25-HYDROXY, VITAMIN D: 10 ng/mL — AB

## 2016-12-04 LAB — C-REACTIVE PROTEIN: CRP: 3.7 mg/L (ref 0.0–4.9)

## 2016-12-04 LAB — VITAMIN B12: Vitamin B-12: 438 pg/mL (ref 232–1245)

## 2016-12-04 LAB — SEDIMENTATION RATE: SED RATE: 2 mm/h (ref 0–40)

## 2016-12-08 ENCOUNTER — Ambulatory Visit: Payer: Medicare Other

## 2016-12-21 ENCOUNTER — Ambulatory Visit (INDEPENDENT_AMBULATORY_CARE_PROVIDER_SITE_OTHER): Payer: Medicare Other | Admitting: Family Medicine

## 2016-12-21 ENCOUNTER — Encounter: Payer: Self-pay | Admitting: Family Medicine

## 2016-12-21 VITALS — BP 117/82 | HR 120 | Temp 97.7°F

## 2016-12-21 DIAGNOSIS — R531 Weakness: Secondary | ICD-10-CM | POA: Diagnosis not present

## 2016-12-21 DIAGNOSIS — K625 Hemorrhage of anus and rectum: Secondary | ICD-10-CM | POA: Diagnosis not present

## 2016-12-21 NOTE — Progress Notes (Signed)
BP 117/82   Pulse (!) 120   Temp 97.7 F (36.5 C)   SpO2 99%    Subjective:    Patient ID: Caitlyn May, female    DOB: 07-14-1955, 61 y.o.   MRN: [id number]  HPI: Caitlyn May is a 61 y.o. female  Chief Complaint  Patient presents with  . Rectal Bleeding    she was giving plasma on Monday and passed out and had diarrhea. They called 911, but she refused to go to the hospital. The rest of the night she had bloody diarrhea.  . Vaginal Discharge    been on going for months, been trying OTC Monistat.   Patient presents today for weakness, fatigue, syncopal episode at plasma after donation 3 days ago. Has also noticed some bright red blood from rectum that evening, has since resolved. EMS was called to the plasma center but pt refused to go to the hospital. No further syncopal episodes, just weakness. Pt has eaten half a can of chicken soup since yesterday, drinking some fluids. Denies abdominal pain, vomiting, fevers, urinary symptoms, CP, SOB. Does have known hx of internal hemorrhoids with past bleeding episodes. Also has long hx of dizziness and labile BPs.   Past Medical History:  Diagnosis Date  . Chronic kidney disease   . Depression   . Failed back syndrome, lumbar   . Hyperlipidemia   . Hypertension   . Osteoporosis   . Weight loss    Social History   Social History  . Marital status: Married    Spouse name: N/A  . Number of children: N/A  . Years of education: N/A   Occupational History  . Not on file.   Social History Main Topics  . Smoking status: Current Every Day Smoker    Packs/day: 2.00    Types: Cigarettes  . Smokeless tobacco: Never Used  . Alcohol use No  . Drug use: No  . Sexual activity: Not on file   Other Topics Concern  . Not on file   Social History Narrative  . No narrative on file    Relevant past medical, surgical, family and social history reviewed and updated as indicated. Interim medical history since our last visit  reviewed. Allergies and medications reviewed and updated.  Review of Systems  Constitutional: Positive for fatigue.  Respiratory: Negative.   Cardiovascular: Negative.   Gastrointestinal: Positive for anal bleeding.  Genitourinary: Negative.   Musculoskeletal: Negative.   Skin: Negative.   Neurological: Positive for dizziness and weakness.  Psychiatric/Behavioral: Negative.    Per HPI unless specifically indicated above     Objective:    BP 117/82   Pulse (!) 120   Temp 97.7 F (36.5 C)   SpO2 99%   Wt Readings from Last 3 Encounters:  11/29/16 130 lb (59 kg)  11/10/16 130 lb (59 kg)  10/20/16 129 lb (58.5 kg)    Physical Exam  Constitutional: She is oriented to person, place, and time. She appears well-developed and well-nourished.  Laying on exam table with eyes closed  HENT:  Head: Atraumatic.  Eyes: Pupils are equal, round, and reactive to light. Conjunctivae are normal. No scleral icterus.  Neck: Normal range of motion. Neck supple.  Cardiovascular: Normal heart sounds.   Tachycardic  Pulmonary/Chest: Effort normal and breath sounds normal. No respiratory distress.  Musculoskeletal: She exhibits no edema, tenderness or deformity.  Neurological: She is alert and oriented to person, place, and time.  Skin: Skin is warm and  dry.  Psychiatric: She has a normal mood and affect. Her behavior is normal.  Nursing note and vitals reviewed.  Results for orders placed or performed in visit on 12/21/16  CBC With Differential/Platelet  Result Value Ref Range   WBC 11.9 (H) 3.4 - 10.8 x10E3/uL   RBC 4.08 3.77 - 5.28 x10E6/uL   Hemoglobin 13.2 11.1 - 15.9 g/dL   Hematocrit 39.3 34.0 - 46.6 %   MCV 96 79 - 97 fL   MCH 32.4 26.6 - 33.0 pg   MCHC 33.6 31.5 - 35.7 g/dL   RDW 13.1 12.3 - 15.4 %   Platelets 166 150 - 379 x10E3/uL   Neutrophils 73 Not Estab. %   Lymphs 17 Not Estab. %   MID 11 Not Estab. %   Neutrophils Absolute 8.6 (H) 1.4 - 7.0 x10E3/uL   Lymphocytes  Absolute 2.0 0.7 - 3.1 x10E3/uL   MID (Absolute) 1.3 0.1 - 1.6 X10E3/uL      Assessment & Plan:   Problem List Items Addressed This Visit    None    Visit Diagnoses    Weakness    -  Primary   Relevant Orders   EKG 12-Lead (Completed)   Rectal bleeding       Relevant Orders   CBC With Differential/Platelet (Completed)    Case discussed with Dr. Jeananne Rama, who is agreeable to plan. CBC fairly benign, notable only for mild white count elevation at 11.9.  Afebrile, EKG with no acute changes, HR of 99 bpm. Suspect dehydration, push fluids and slowly advance diet as tolerated. Discussed strict return precautions, including any fevers, further syncope, or worsening sxs - pt aware to go to the ER right away. Suspect bleeding from internal hemorrhoids but will continue to monitor closely.   Follow up plan: Return if symptoms worsen or fail to improve.

## 2016-12-22 LAB — CBC WITH DIFFERENTIAL/PLATELET
Hematocrit: 39.3 % (ref 34.0–46.6)
Hemoglobin: 13.2 g/dL (ref 11.1–15.9)
Lymphocytes Absolute: 2 10*3/uL (ref 0.7–3.1)
Lymphs: 17 %
MCH: 32.4 pg (ref 26.6–33.0)
MCHC: 33.6 g/dL (ref 31.5–35.7)
MCV: 96 fL (ref 79–97)
MID (Absolute): 1.3 10*3/uL (ref 0.1–1.6)
MID: 11 %
NEUTROS ABS: 8.6 10*3/uL — AB (ref 1.4–7.0)
NEUTROS PCT: 73 %
PLATELETS: 166 10*3/uL (ref 150–379)
RBC: 4.08 x10E6/uL (ref 3.77–5.28)
RDW: 13.1 % (ref 12.3–15.4)
WBC: 11.9 10*3/uL — AB (ref 3.4–10.8)

## 2016-12-23 NOTE — Patient Instructions (Signed)
Follow up as needed

## 2017-01-07 ENCOUNTER — Other Ambulatory Visit: Payer: Self-pay | Admitting: Family Medicine

## 2017-01-09 ENCOUNTER — Other Ambulatory Visit: Payer: Self-pay | Admitting: Family Medicine

## 2017-01-09 NOTE — Telephone Encounter (Signed)
Routing to provider. Appt on 01/11/17.

## 2017-01-11 ENCOUNTER — Ambulatory Visit (INDEPENDENT_AMBULATORY_CARE_PROVIDER_SITE_OTHER): Payer: Medicare Other | Admitting: Family Medicine

## 2017-01-11 ENCOUNTER — Encounter: Payer: Self-pay | Admitting: Family Medicine

## 2017-01-11 VITALS — BP 109/71 | HR 73 | Wt 125.0 lb

## 2017-01-11 DIAGNOSIS — G894 Chronic pain syndrome: Secondary | ICD-10-CM

## 2017-01-11 DIAGNOSIS — E782 Mixed hyperlipidemia: Secondary | ICD-10-CM

## 2017-01-11 DIAGNOSIS — M961 Postlaminectomy syndrome, not elsewhere classified: Secondary | ICD-10-CM

## 2017-01-11 DIAGNOSIS — F3342 Major depressive disorder, recurrent, in full remission: Secondary | ICD-10-CM | POA: Diagnosis not present

## 2017-01-11 DIAGNOSIS — I10 Essential (primary) hypertension: Secondary | ICD-10-CM | POA: Diagnosis not present

## 2017-01-11 MED ORDER — CYCLOBENZAPRINE HCL 5 MG PO TABS: 5.0000 mg | ORAL_TABLET | Freq: Two times a day (BID) | ORAL | 1 refills | Status: DC | PRN

## 2017-01-11 NOTE — Assessment & Plan Note (Signed)
The current medical regimen is effective;  continue present plan and medications.  

## 2017-01-11 NOTE — Assessment & Plan Note (Signed)
Abdomen workup through pain clinic

## 2017-01-11 NOTE — Progress Notes (Signed)
BP 109/71   Pulse 73   Wt 125 lb (56.7 kg)   SpO2 99%   BMI 19.58 kg/m    Subjective:    Patient ID: Caitlyn May, female    DOB: 09-11-1955, 61 y.o.   MRN: [id number]  HPI: Caitlyn May is a 61 y.o. female  Chief Complaint  Patient presents with  . Follow-up   Patient follow-up doing well no complaints taking blood pressure cholesterol medicine faithfully without problems. Depression medicine doing okay at best. Takes Flexeril okay. Xanax 0.5 takes 1-2 a day. During the rainy season patient's been taking more. Relevant past medical, surgical, family and social history reviewed and updated as indicated. Interim medical history since our last visit reviewed. Allergies and medications reviewed and updated.  Review of Systems  Constitutional: Negative.   Respiratory: Negative.   Cardiovascular: Negative.     Per HPI unless specifically indicated above     Objective:    BP 109/71   Pulse 73   Wt 125 lb (56.7 kg)   SpO2 99%   BMI 19.58 kg/m   Wt Readings from Last 3 Encounters:  01/11/17 125 lb (56.7 kg)  11/29/16 130 lb (59 kg)  11/10/16 130 lb (59 kg)    Physical Exam  Constitutional: She is oriented to person, place, and time. She appears well-developed and well-nourished.  HENT:  Head: Normocephalic and atraumatic.  Eyes: Conjunctivae and EOM are normal.  Neck: Normal range of motion.  Cardiovascular: Normal rate, regular rhythm and normal heart sounds.   Pulmonary/Chest: Effort normal and breath sounds normal.  Musculoskeletal: Normal range of motion.  Neurological: She is alert and oriented to person, place, and time.  Skin: No erythema.  Psychiatric: She has a normal mood and affect. Her behavior is normal. Judgment and thought content normal.    Results for orders placed or performed in visit on 12/21/16  CBC With Differential/Platelet  Result Value Ref Range   WBC 11.9 (H) 3.4 - 10.8 x10E3/uL   RBC 4.08 3.77 - 5.28 x10E6/uL   Hemoglobin 13.2 11.1 - 15.9 g/dL   Hematocrit 39.3 34.0 - 46.6 %   MCV 96 79 - 97 fL   MCH 32.4 26.6 - 33.0 pg   MCHC 33.6 31.5 - 35.7 g/dL   RDW 13.1 12.3 - 15.4 %   Platelets 166 150 - 379 x10E3/uL   Neutrophils 73 Not Estab. %   Lymphs 17 Not Estab. %   MID 11 Not Estab. %   Neutrophils Absolute 8.6 (H) 1.4 - 7.0 x10E3/uL   Lymphocytes Absolute 2.0 0.7 - 3.1 x10E3/uL   MID (Absolute) 1.3 0.1 - 1.6 X10E3/uL      Assessment & Plan:   Problem List Items Addressed This Visit      Cardiovascular and Mediastinum   Hypertension - Primary    The current medical regimen is effective;  continue present plan and medications.       Relevant Orders   Basic metabolic panel   LP+ALT+AST Piccolo, Waived     Other   Postlaminectomy syndrome, lumbar region (Chronic)   Chronic pain syndrome (Chronic)    Abdomen workup through pain clinic      Depression    The current medical regimen is effective;  continue present plan and medications.\      Hyperlipidemia   Relevant Orders   Basic metabolic panel   LP+ALT+AST Piccolo, Waived       Follow up plan: Return in about 3 months (  around 04/13/2017) for Physical Exam.

## 2017-01-12 ENCOUNTER — Ambulatory Visit: Payer: Medicare Other

## 2017-01-12 ENCOUNTER — Encounter: Payer: Self-pay | Admitting: Family Medicine

## 2017-01-12 ENCOUNTER — Other Ambulatory Visit: Payer: Self-pay | Admitting: Family Medicine

## 2017-01-12 ENCOUNTER — Ambulatory Visit (INDEPENDENT_AMBULATORY_CARE_PROVIDER_SITE_OTHER): Payer: Medicare Other | Admitting: Cardiovascular Disease

## 2017-01-12 ENCOUNTER — Ambulatory Visit (INDEPENDENT_AMBULATORY_CARE_PROVIDER_SITE_OTHER): Payer: Medicare Other

## 2017-01-12 ENCOUNTER — Encounter: Payer: Self-pay | Admitting: Cardiovascular Disease

## 2017-01-12 ENCOUNTER — Other Ambulatory Visit: Payer: Self-pay

## 2017-01-12 VITALS — BP 111/77 | HR 79 | Ht 66.0 in | Wt 122.2 lb

## 2017-01-12 DIAGNOSIS — R0602 Shortness of breath: Secondary | ICD-10-CM

## 2017-01-12 DIAGNOSIS — R0989 Other specified symptoms and signs involving the circulatory and respiratory systems: Secondary | ICD-10-CM

## 2017-01-12 DIAGNOSIS — I10 Essential (primary) hypertension: Secondary | ICD-10-CM

## 2017-01-12 DIAGNOSIS — R55 Syncope and collapse: Secondary | ICD-10-CM | POA: Diagnosis not present

## 2017-01-12 DIAGNOSIS — M961 Postlaminectomy syndrome, not elsewhere classified: Secondary | ICD-10-CM

## 2017-01-12 LAB — BASIC METABOLIC PANEL
BUN / CREAT RATIO: 10 — AB (ref 12–28)
BUN: 11 mg/dL (ref 8–27)
CO2: 20 mmol/L (ref 20–29)
Calcium: 9.2 mg/dL (ref 8.7–10.3)
Chloride: 102 mmol/L (ref 96–106)
Creatinine, Ser: 1.11 mg/dL — ABNORMAL HIGH (ref 0.57–1.00)
GFR, EST AFRICAN AMERICAN: 62 mL/min/{1.73_m2} (ref >59)
GFR, EST NON AFRICAN AMERICAN: 53 mL/min/{1.73_m2} — AB (ref >59)
Glucose: 90 mg/dL (ref 65–99)
POTASSIUM: 4.7 mmol/L (ref 3.5–5.2)
SODIUM: 138 mmol/L (ref 134–144)

## 2017-01-12 LAB — LP+ALT+AST PICCOLO, WAIVED
ALT (SGPT) Piccolo, Waived: 17 U/L (ref 10–47)
AST (SGOT) Piccolo, Waived: 21 U/L (ref 11–38)
CHOL/HDL RATIO PICCOLO,WAIVE: 2.8 mg/dL
CHOLESTEROL PICCOLO, WAIVED: 169 mg/dL (ref <200)
HDL Chol Piccolo, Waived: 61 mg/dL (ref >59)
LDL CHOL CALC PICCOLO WAIVED: 81 mg/dL (ref <100)
Triglycerides Piccolo,Waived: 135 mg/dL (ref <150)
VLDL CHOL CALC PICCOLO,WAIVE: 27 mg/dL (ref <30)

## 2017-01-12 MED ORDER — AMLODIPINE BESYLATE 5 MG PO TABS: 5.0000 mg | ORAL_TABLET | Freq: Every day | ORAL | 3 refills | Status: DC

## 2017-01-12 NOTE — Progress Notes (Signed)
Cardiology Office Note   Date:  01/12/2017   ID:  Caitlyn May, DOB 11-Sep-1955, MRN [id number]  PCP:  Guadalupe Maple, MD  Cardiologist:   Kathlyn Sacramento, MD   Chief Complaint  Patient presents with  . other    New patient. Patient c/o passing out and left leg swelling. Meds reviewed verbally with patient.       History of Present Illness: Caitlyn May is a 61 y.o. female who presents for Evaluation of syncope. She has no prior cardiac history. She has chronic medical conditions that include essential hypertension, hyperlipidemia, tobacco use, and chronic back and neck pain on narcotic medications. She used to go to the pain clinic but has not gone recently and trying to get back. In the meanwhile, she is buying oxycodone from the street. She reports about 8 episodes of syncope over the last 3 years since the death of her mother. She attributed the episodes to stress and grieving. She had a recent episode while she was giving plasma. This was associated with diarrhea. She reported red blood per rectum thought to be due to internal hemorrhoids. CBC showed normal hemoglobin. Basic metabolic profile showed stable renal function with a creatinine of 1.11. The syncopal episodes are usually preceded by sweating or some other discomfort but occasionally can be sudden. This has caused significant distress especially when she is driving as he is afraid to pass out. She denies any chest pain. She has chronic exertional dyspnea.   Past Medical History:  Diagnosis Date  . Chronic kidney disease   . Depression   . Failed back syndrome, lumbar   . Hyperlipidemia   . Hypertension   . Osteoporosis   . Weight loss     Past Surgical History:  Procedure Laterality Date  . ABDOMINAL HYSTERECTOMY    . CESAREAN SECTION    . MOUTH SURGERY    . SPINE SURGERY       Current Outpatient Prescriptions  Medication Sig Dispense Refill  . ALPRAZolam (XANAX) 0.5 MG tablet TAKE ONE TABLET  BY MOUTH TWICE DAILY AS NEEDED FOR ANXIETY 50 tablet 1  . amLODipine (NORVASC) 10 MG tablet Take 10 mg by mouth daily.    Marland Kitchen atorvastatin (LIPITOR) 40 MG tablet Take 1 tablet (40 mg total) by mouth daily. 30 tablet 12  . benazepril (LOTENSIN) 40 MG tablet Take 1 tablet (40 mg total) by mouth daily. 30 tablet 12  . cetirizine (ZYRTEC) 10 MG tablet Take 1 tablet (10 mg total) by mouth daily. 30 tablet 11  . cyclobenzaprine (FLEXERIL) 5 MG tablet Take 1 tablet (5 mg total) by mouth 2 (two) times daily as needed for muscle spasms. 60 tablet 1  . diclofenac sodium (VOLTAREN) 1 % GEL Apply 2 g topically 4 (four) times daily. 100 g 6  . metoprolol succinate (TOPROL-XL) 100 MG 24 hr tablet TAKE ONE TABLET BY MOUTH EVERY DAY WITH A MEAL 30 tablet 0  . ranitidine (ZANTAC) 75 MG tablet Take 1 tablet (75 mg total) by mouth 2 (two) times daily. 60 tablet 12  . sertraline (ZOLOFT) 100 MG tablet Take 1 tablet (100 mg total) by mouth daily. 30 tablet 12  . triamcinolone (NASACORT AQ) 55 MCG/ACT AERO nasal inhaler Place 2 sprays into the nose daily. 1 Inhaler 12   No current facility-administered medications for this visit.     Allergies:   Accupril [quinapril hcl]; Avelox [moxifloxacin]; Ciprofloxacin; Hctz [hydrochlorothiazide]; Penicillins; and Sulfa antibiotics  Social History:  The patient  reports that she has been smoking Cigarettes.  She has been smoking about 2.00 packs per day. She has never used smokeless tobacco. She reports that she does not drink alcohol or use drugs.   Family History:  The patient's family history includes Cancer in her father and maternal aunt; Dementia in her mother; Hypertension in her mother.    ROS:  Please see the history of present illness.   Otherwise, review of systems are positive for none.   All other systems are reviewed and negative.    PHYSICAL EXAM: VS:  BP 111/77 (BP Location: Left Arm, Patient Position: Sitting, Cuff Size: Normal)   Pulse 79   Ht 5\' 6"   (1.676 m)   Wt 122 lb 4 oz (55.5 kg)   BMI 19.73 kg/m  , BMI Body mass index is 19.73 kg/m. GEN: Well nourished, well developed, in no acute distress  HEENT: normal  Neck: no JVD,  or masses. Faint right carotid bruit Cardiac: RRR; no murmurs, rubs, or gallops,no edema  Respiratory:  clear to auscultation bilaterally, normal work of breathing GI: soft, nontender, nondistended, + BS MS: no deformity or atrophy  Skin: warm and dry, no rash Neuro:  Strength and sensation are intact Psych: euthymic mood, full affect   EKG:  EKG is ordered today. The ekg ordered today demonstrates normal sinus rhythm with possible left atrial enlargement.   Recent Labs: 11/10/2016: ALT (SGPT) Piccolo, Waived 27; TSH 1.200 11/29/2016: Magnesium 2.0 12/21/2016: Hemoglobin 13.2; Platelets 166 01/11/2017: BUN 11; Creatinine, Ser 1.11; Potassium 4.7; Sodium 138    Lipid Panel    Component Value Date/Time   CHOL 140 11/10/2016 1428   TRIG 109 11/10/2016 1428   HDL 52 04/06/2016 1000   CHOLHDL 3.0 04/06/2016 1000   VLDL 22 11/10/2016 1428   LDLCALC 85 04/06/2016 1000      Wt Readings from Last 3 Encounters:  01/12/17 122 lb 4 oz (55.5 kg)  01/11/17 125 lb (56.7 kg)  11/29/16 130 lb (59 kg)       No flowsheet data found.    ASSESSMENT AND PLAN:  1.  Syncope: Based on the description, likely neurocardiogenic. It's also possible that there is significant fluctuation in her blood pressure depending on the level of pain she is having and whether she is on narcotic pain medications are not. Blood pressure is actually on the low side today. Given chronic dyspnea, ongoing to obtain an echocardiogram to ensure no structural heart abnormalities. Her baseline ECG is not suggestive of any conduction disease abnormalities. She does not complain of palpitations or tachycardia and thus I don't think the episodes are mediated by arrhythmia.   2. Essential hypertension: I decreased amlodipine to 5 mg  daily.  3. Dizziness and faint carotid bruit: I requested carotid Doppler especially with prolonged history of smoking.     Disposition:   FU with me in 2 months  Signed,  Kathlyn Sacramento, MD  01/12/2017 9:27 AM    [place]

## 2017-01-12 NOTE — Patient Instructions (Addendum)
Medication Instructions:  Your physician has recommended you make the following change in your medication:  DECREASE amlodipine to 5mg  once daily   Labwork: none  Testing/Procedures: Your physician has requested that you have an echocardiogram. Echocardiography is a painless test that uses sound waves to create images of your heart. It provides your doctor with information about the size and shape of your heart and how well your heart's chambers and valves are working. This procedure takes approximately one hour. There are no restrictions for this procedure.  Your physician has requested that you have a carotid duplex. This test is an ultrasound of the carotid arteries in your neck. It looks at blood flow through these arteries that supply the brain with blood. Allow one hour for this exam. There are no restrictions or special instructions.    Follow-Up: Your physician recommends that you schedule a follow-up appointment in: two months with Dr. Fletcher Anon.    Any Other Special Instructions Will Be Listed Below (If Applicable).     If you need a refill on your cardiac medications before your next appointment, please call your pharmacy.  Echocardiogram An echocardiogram, or echocardiography, uses sound waves (ultrasound) to produce an image of your heart. The echocardiogram is simple, painless, obtained within a short period of time, and offers valuable information to your health care provider. The images from an echocardiogram can provide information such as:  Evidence of coronary artery disease (CAD).  Heart size.  Heart muscle function.  Heart valve function.  Aneurysm detection.  Evidence of a past heart attack.  Fluid buildup around the heart.  Heart muscle thickening.  Assess heart valve function.  Tell a health care provider about:  Any allergies you have.  All medicines you are taking, including vitamins, herbs, eye drops, creams, and over-the-counter  medicines.  Any problems you or family members have had with anesthetic medicines.  Any blood disorders you have.  Any surgeries you have had.  Any medical conditions you have.  Whether you are pregnant or may be pregnant. What happens before the procedure? No special preparation is needed. Eat and drink normally. What happens during the procedure?  In order to produce an image of your heart, gel will be applied to your chest and a wand-like tool (transducer) will be moved over your chest. The gel will help transmit the sound waves from the transducer. The sound waves will harmlessly bounce off your heart to allow the heart images to be captured in real-time motion. These images will then be recorded.  You may need an IV to receive a medicine that improves the quality of the pictures. What happens after the procedure? You may return to your normal schedule including diet, activities, and medicines, unless your health care provider tells you otherwise. This information is not intended to replace advice given to you by your health care provider. Make sure you discuss any questions you have with your health care provider. Document Released: 05/13/2000 Document Revised: 01/02/2016 Document Reviewed: 01/21/2013 Elsevier Interactive Patient Education  2017 [employer].

## 2017-01-13 LAB — VAS US CAROTID
LCCAPSYS: 85 cm/s
LEFT ECA DIAS: -13 cm/s
LEFT VERTEBRAL DIAS: 16 cm/s
LICADDIAS: -35 cm/s
LICAPSYS: -66 cm/s
Left CCA dist dias: 30 cm/s
Left CCA dist sys: 83 cm/s
Left CCA prox dias: 26 cm/s
Left ICA dist sys: -76 cm/s
Left ICA prox dias: -25 cm/s
RIGHT ECA DIAS: -17 cm/s
RIGHT VERTEBRAL DIAS: 19 cm/s
Right CCA prox dias: -23 cm/s
Right CCA prox sys: -73 cm/s
Right cca dist sys: -88 cm/s

## 2017-01-13 LAB — ECHOCARDIOGRAM COMPLETE
HEIGHTINCHES: 66 in
WEIGHTICAEL: 1956 [oz_av]

## 2017-01-23 ENCOUNTER — Ambulatory Visit: Payer: Medicare Other | Admitting: Family Medicine

## 2017-02-07 ENCOUNTER — Telehealth: Payer: Self-pay | Admitting: *Deleted

## 2017-02-07 ENCOUNTER — Encounter: Payer: Self-pay | Admitting: Nurse Practitioner

## 2017-02-07 ENCOUNTER — Other Ambulatory Visit: Payer: Self-pay | Admitting: Nurse Practitioner

## 2017-02-07 DIAGNOSIS — E559 Vitamin D deficiency, unspecified: Secondary | ICD-10-CM | POA: Insufficient documentation

## 2017-02-07 MED ORDER — ERGOCALCIFEROL 1.25 MG (50000 UT) PO CAPS: 50000.0000 [IU] | ORAL_CAPSULE | ORAL | 0 refills | Status: AC

## 2017-02-07 NOTE — Telephone Encounter (Signed)
Attempted to call patient to inform her of lab results. Message left.

## 2017-02-07 NOTE — Telephone Encounter (Signed)
-----   Message from Vevelyn Francois, NP sent at 02/07/2017 10:13 AM EDT ----- Please make pt aware that her Vitamin D level is 10. She needs to start the Vitamin D 50,000 units weekly as prescribed. Rx sent. Once completed, she will then start OTC vitamin D at 1,000 to 2,000 units per day and have the vitamin d level repeated in one yr.  FYI  Pt was seen here in July (NP by Dr Delane Ginger) She was to have an MRI,(apporved) but this has not been done. I am not sure why the patient has not returned.  I did see a note in the chart where is buying medication off the street.  Thanks

## 2017-02-07 NOTE — Progress Notes (Signed)
Results were reviewed and found to be: mildly abnormal  Further testing may be useful  Review would suggest interventional pain management techniques may be of benefit

## 2017-02-07 NOTE — Progress Notes (Signed)
Results were reviewed and found to be: abnormal  Further testing may be useful  Review would suggest interventional pain management techniques may be of benefit

## 2017-02-08 ENCOUNTER — Telehealth: Payer: Self-pay | Admitting: *Deleted

## 2017-02-08 NOTE — Telephone Encounter (Signed)
Attempted to call patient to inform her of lab results. Message left.

## 2017-02-14 ENCOUNTER — Telehealth: Payer: Self-pay | Admitting: *Deleted

## 2017-02-14 ENCOUNTER — Other Ambulatory Visit: Payer: Self-pay | Admitting: Pain Medicine

## 2017-02-14 NOTE — Telephone Encounter (Signed)
Attempted to call patient will lab results, no answer. Unable to leave message.

## 2017-02-14 NOTE — Telephone Encounter (Signed)
Patient lvmail asking to speak with Dena, I looked up her information, gave MRI and Nerve conduction studies to [facility] for scheduling, am sending psychology consult to Dr. Tollie Pizza again. Please inform patient if she doesn't hear from Dr. Tollie Pizza in the next couple weeks to call him at [phone]

## 2017-02-15 NOTE — Telephone Encounter (Signed)
Attempted to call patient, message left. 

## 2017-02-16 NOTE — Telephone Encounter (Signed)
Patient has not called back to receive her lab results. Will complete this message and file.

## 2017-02-21 ENCOUNTER — Telehealth: Payer: Self-pay | Admitting: *Deleted

## 2017-02-21 NOTE — Telephone Encounter (Signed)
Instructed patient that she has Vitamin D 50,000 units called into pharmacy.  She is to take one a week for 12 weeks then follow up with OTC 1000-2000 units daily.

## 2017-02-22 ENCOUNTER — Ambulatory Visit
Admission: RE | Admit: 2017-02-22 | Discharge: 2017-02-22 | Disposition: A | Discharge status: Home / Self Care | Payer: Medicare Other | Source: Ambulatory Visit | Attending: Pain Medicine | Admitting: Pain Medicine

## 2017-02-22 DIAGNOSIS — M961 Postlaminectomy syndrome, not elsewhere classified: Secondary | ICD-10-CM | POA: Diagnosis not present

## 2017-02-22 DIAGNOSIS — M5126 Other intervertebral disc displacement, lumbar region: Secondary | ICD-10-CM | POA: Diagnosis not present

## 2017-02-22 DIAGNOSIS — M5136 Other intervertebral disc degeneration, lumbar region: Secondary | ICD-10-CM | POA: Diagnosis not present

## 2017-02-22 DIAGNOSIS — M48061 Spinal stenosis, lumbar region without neurogenic claudication: Secondary | ICD-10-CM | POA: Insufficient documentation

## 2017-02-22 DIAGNOSIS — M545 Low back pain: Secondary | ICD-10-CM | POA: Diagnosis not present

## 2017-03-01 ENCOUNTER — Ambulatory Visit: Payer: Medicare Other

## 2017-03-06 ENCOUNTER — Other Ambulatory Visit: Payer: Self-pay | Admitting: Family Medicine

## 2017-03-11 ENCOUNTER — Other Ambulatory Visit: Payer: Self-pay | Admitting: Family Medicine

## 2017-03-11 DIAGNOSIS — M961 Postlaminectomy syndrome, not elsewhere classified: Secondary | ICD-10-CM

## 2017-03-21 ENCOUNTER — Ambulatory Visit: Payer: Medicare Other | Admitting: Cardiovascular Disease

## 2017-03-22 ENCOUNTER — Encounter: Payer: Self-pay | Admitting: Cardiovascular Disease

## 2017-03-30 ENCOUNTER — Telehealth: Payer: Self-pay

## 2017-03-30 NOTE — Telephone Encounter (Signed)
Called patient with need to reschedule appointment from 04/07/2017 to another date. Attempted to call patient with all numbers listed in her chart, no working number. Will attempt again at later time.

## 2017-04-03 ENCOUNTER — Ambulatory Visit: Payer: Medicare Other

## 2017-04-03 ENCOUNTER — Ambulatory Visit (INDEPENDENT_AMBULATORY_CARE_PROVIDER_SITE_OTHER): Payer: Medicare Other | Admitting: Family Medicine

## 2017-04-03 ENCOUNTER — Telehealth: Payer: Self-pay | Admitting: Family Medicine

## 2017-04-03 ENCOUNTER — Encounter: Payer: Self-pay | Admitting: Family Medicine

## 2017-04-03 VITALS — BP 138/83 | HR 80 | Temp 98.1°F | Wt 128.0 lb

## 2017-04-03 DIAGNOSIS — M961 Postlaminectomy syndrome, not elsewhere classified: Secondary | ICD-10-CM | POA: Diagnosis not present

## 2017-04-03 DIAGNOSIS — J069 Acute upper respiratory infection, unspecified: Secondary | ICD-10-CM

## 2017-04-03 DIAGNOSIS — F419 Anxiety disorder, unspecified: Secondary | ICD-10-CM

## 2017-04-03 MED ORDER — ALPRAZOLAM 0.5 MG PO TABS: 0.5000 mg | ORAL_TABLET | Freq: Two times a day (BID) | ORAL | 0 refills | Status: DC | PRN

## 2017-04-03 MED ORDER — AZITHROMYCIN 250 MG PO TABS: ORAL_TABLET | ORAL | 0 refills | Status: DC

## 2017-04-03 MED ORDER — BENZONATATE 200 MG PO CAPS: 200.0000 mg | ORAL_CAPSULE | Freq: Two times a day (BID) | ORAL | 0 refills | Status: DC | PRN

## 2017-04-03 MED ORDER — CYCLOBENZAPRINE HCL 5 MG PO TABS: 5.0000 mg | ORAL_TABLET | Freq: Two times a day (BID) | ORAL | 0 refills | Status: DC | PRN

## 2017-04-03 NOTE — Progress Notes (Signed)
BP 138/83   Pulse 80   Temp 98.1 F (36.7 C)   Wt 128 lb (58.1 kg)   SpO2 96%   BMI 20.66 kg/m    Subjective:    Patient ID: Caitlyn May, female    DOB: [birth year], 61 y.o.   MRN: [id number]  HPI: Caitlyn May is a 61 y.o. female  Chief Complaint  Patient presents with  . Sinusitis    x 1 week, head/chest congestion, sinus drainage, productive cough, raw throat, h/a,  sinus drainage. No fever.  No ear ache.   Over a week of productive cough with brown sputum, nasal congestion, sore throat, facial pain and pressure. Denies SOB, CP, fevers. Trying flonase and zyrtec with no relief. Does not have any home inhalers. Smokes 1.5 ppd, not ready to quit. No sick contacts known.   Also needing xanax and flexeril refill. Taking about 2 daily for both, doing very well. No side effects noted.   Past Medical History:  Diagnosis Date  . Chronic kidney disease   . Depression   . Failed back syndrome, lumbar   . Hyperlipidemia   . Hypertension   . Osteoporosis   . Weight loss    Social History   Socioeconomic History  . Marital status: Married    Spouse name: Not on file  . Number of children: Not on file  . Years of education: Not on file  . Highest education level: Not on file  Social Needs  . Financial resource strain: Patient refused  . Food insecurity - worry: Patient refused  . Food insecurity - inability: Patient refused  . Transportation needs - medical: Patient refused  . Transportation needs - non-medical: Patient refused  Occupational History  . Not on file  Tobacco Use  . Smoking status: Current Every Day Smoker    Packs/day: 2.00    Types: Cigarettes  . Smokeless tobacco: Never Used  Substance and Sexual Activity  . Alcohol use: No  . Drug use: No  . Sexual activity: Not on file  Other Topics Concern  . Not on file  Social History Narrative  . Not on file    Relevant past medical, surgical, family and social history reviewed and updated as  indicated. Interim medical history since our last visit reviewed. Allergies and medications reviewed and updated.  Review of Systems  Constitutional: Negative.   HENT: Positive for congestion, sinus pressure, sinus pain and sore throat.   Eyes: Negative.   Respiratory: Positive for cough and wheezing.   Cardiovascular: Negative.   Gastrointestinal: Negative.   Genitourinary: Negative.   Musculoskeletal: Negative.   Neurological: Negative.   Psychiatric/Behavioral: Negative.     Per HPI unless specifically indicated above     Objective:    BP 138/83   Pulse 80   Temp 98.1 F (36.7 C)   Wt 128 lb (58.1 kg)   SpO2 96%   BMI 20.66 kg/m   Wt Readings from Last 3 Encounters:  04/03/17 128 lb (58.1 kg)  01/12/17 122 lb 4 oz (55.5 kg)  01/11/17 125 lb (56.7 kg)    Physical Exam  Constitutional: She is oriented to person, place, and time. She appears well-developed and well-nourished. No distress.  HENT:  Head: Atraumatic.  Right Ear: External ear normal.  Left Ear: External ear normal.  Nasal mucosa and oropharynx erythematous  Eyes: Conjunctivae are normal. Pupils are equal, round, and reactive to light. No scleral icterus.  Neck: Normal range of motion. Neck  supple.  Cardiovascular: Normal rate and normal heart sounds.  Pulmonary/Chest: Effort normal. No respiratory distress. She has wheezes (mild).  Musculoskeletal: Normal range of motion.  Neurological: She is alert and oriented to person, place, and time.  Skin: Skin is warm and dry.  Psychiatric: She has a normal mood and affect. Her behavior is normal.  Nursing note and vitals reviewed.     Assessment & Plan:   Problem List Items Addressed This Visit      Other   Postlaminectomy syndrome, lumbar region (Chronic)    Good improvement with flexeril. Flexeril refilled, continue current regimen      Relevant Medications   cyclobenzaprine (FLEXERIL) 5 MG tablet    Other Visit Diagnoses    Upper respiratory  tract infection, unspecified type    -  Primary   Will treat with zpack and tessalon perles. Can take plain mucinex and delsym prn. Humidifier, rest, good hydration. F/u if worsening or no improvement   Relevant Medications   azithromycin (ZITHROMAX) 250 MG tablet   Anxiety       Xanax refilled. Precautions reviewed. Continue current regimen   Relevant Medications   ALPRAZolam (XANAX) 0.5 MG tablet       Follow up plan: Return for as scheduled for AWV.

## 2017-04-03 NOTE — Patient Instructions (Signed)
Follow up as needed

## 2017-04-03 NOTE — Assessment & Plan Note (Addendum)
Good improvement with flexeril. Flexeril refilled, continue current regimen

## 2017-04-03 NOTE — Telephone Encounter (Signed)
Called pt to re- sched for AWV with Nurse Health Advisor. C/b #  [phone]  Kathryn Brown ° °

## 2017-04-07 ENCOUNTER — Ambulatory Visit: Payer: Medicare Other

## 2017-04-10 ENCOUNTER — Ambulatory Visit: Payer: Self-pay | Admitting: *Deleted

## 2017-04-10 ENCOUNTER — Ambulatory Visit: Payer: Medicare Other

## 2017-04-10 ENCOUNTER — Ambulatory Visit: Payer: Self-pay

## 2017-04-10 NOTE — Telephone Encounter (Signed)
Routing to provider. Patient finished z-pack and is still having pressure behind the eyes. Would like new antibiotic sent in.

## 2017-04-10 NOTE — Telephone Encounter (Signed)
   Reason for Disposition . [1] Taking antibiotic > 72 hours (3 days) AND [2] sinus pain not improved  Answer Assessment - Initial Assessment Questions 1. ANTIBIOTIC: "What antibiotic are you receiving?" "How many times per day?"     Azithromycin finished on Friday 2. ONSET: "When was the antibiotic started?"    Pt seen and treated on 04/03/17 3. PAIN: "How bad is the sinus pain?"   (Scale 1-10; mild, moderate or severe)   - MILD (1-3): doesn't interfere with normal activities    - MODERATE (4-7): interferes with normal activities (e.g., work or school) or awakens from sleep   - SEVERE (8-10): excruciating pain and patient unable to do any normal activities        Moderate 7 on a scale of 1-10 4. FEVER: "Do you have a fever?" If so, ask: "What is it, how was it measured, and when did it start?"      Off and on, not aware of any today 5. SYMPTOMS: "Are there any other symptoms you're concerned about?" If so, ask: "When did it start?"     Pressure behind eyes that does not decrease with tylenol and headache  Protocols used: SINUS INFECTION ON ANTIBIOTIC FOLLOW-UP CALL-A-AH  Pt recently seen on 04/03/17 for sinus infection and given abx with no improvement. Abx course completed, pt still has complaints of pressure behind eyes. Pt wants to know if she needs to be seen again in the office or if additional meds could be called in for sinus infection.

## 2017-04-11 MED ORDER — DOXYCYCLINE HYCLATE 100 MG PO TABS: 100.0000 mg | ORAL_TABLET | Freq: Two times a day (BID) | ORAL | 0 refills | Status: DC

## 2017-04-11 NOTE — Telephone Encounter (Signed)
Abx sent, if no better needs f/u

## 2017-04-11 NOTE — Addendum Note (Signed)
Addended by: Merrie Roof E on: 04/11/2017 12:10 PM   Modules accepted: Orders

## 2017-04-13 ENCOUNTER — Ambulatory Visit: Payer: Medicare Other | Admitting: Family Medicine

## 2017-04-13 ENCOUNTER — Ambulatory Visit: Payer: Medicare Other

## 2017-04-14 ENCOUNTER — Encounter: Payer: Self-pay | Admitting: Family Medicine

## 2017-04-14 ENCOUNTER — Ambulatory Visit (INDEPENDENT_AMBULATORY_CARE_PROVIDER_SITE_OTHER): Payer: Medicare Other | Admitting: Family Medicine

## 2017-04-14 ENCOUNTER — Ambulatory Visit (INDEPENDENT_AMBULATORY_CARE_PROVIDER_SITE_OTHER): Payer: Medicare Other

## 2017-04-14 VITALS — BP 126/77 | HR 99 | Temp 97.8°F | Ht 66.0 in | Wt 129.1 lb

## 2017-04-14 DIAGNOSIS — Z0000 Encounter for general adult medical examination without abnormal findings: Secondary | ICD-10-CM | POA: Diagnosis not present

## 2017-04-14 DIAGNOSIS — Z23 Encounter for immunization: Secondary | ICD-10-CM

## 2017-04-14 DIAGNOSIS — M961 Postlaminectomy syndrome, not elsewhere classified: Secondary | ICD-10-CM | POA: Diagnosis not present

## 2017-04-14 DIAGNOSIS — F3342 Major depressive disorder, recurrent, in full remission: Secondary | ICD-10-CM | POA: Diagnosis not present

## 2017-04-14 DIAGNOSIS — Z1211 Encounter for screening for malignant neoplasm of colon: Secondary | ICD-10-CM | POA: Diagnosis not present

## 2017-04-14 DIAGNOSIS — I10 Essential (primary) hypertension: Secondary | ICD-10-CM | POA: Diagnosis not present

## 2017-04-14 DIAGNOSIS — E782 Mixed hyperlipidemia: Secondary | ICD-10-CM | POA: Diagnosis not present

## 2017-04-14 DIAGNOSIS — Z7189 Other specified counseling: Secondary | ICD-10-CM

## 2017-04-14 LAB — URINALYSIS, ROUTINE W REFLEX MICROSCOPIC
Bilirubin, UA: NEGATIVE
Glucose, UA: NEGATIVE
Ketones, UA: NEGATIVE
NITRITE UA: NEGATIVE
Protein, UA: NEGATIVE
Specific Gravity, UA: <1.005 — ABNORMAL LOW (ref 1.005–1.030)
UUROB: 0.2 mg/dL (ref 0.2–1.0)
pH, UA: 5.5 (ref 5.0–7.5)

## 2017-04-14 LAB — MICROSCOPIC EXAMINATION

## 2017-04-14 MED ORDER — CYCLOBENZAPRINE HCL 5 MG PO TABS: 5.0000 mg | ORAL_TABLET | Freq: Two times a day (BID) | ORAL | 5 refills | Status: DC | PRN

## 2017-04-14 MED ORDER — AMLODIPINE BESYLATE 10 MG PO TABS: 10.0000 mg | ORAL_TABLET | Freq: Every day | ORAL | 12 refills | Status: DC

## 2017-04-14 MED ORDER — ATORVASTATIN CALCIUM 40 MG PO TABS: 40.0000 mg | ORAL_TABLET | Freq: Every day | ORAL | 12 refills | Status: DC

## 2017-04-14 MED ORDER — CLONAZEPAM 0.5 MG PO TABS
0.5000 mg | ORAL_TABLET | Freq: Two times a day (BID) | ORAL | 2 refills | Status: DC | PRN
Start: 2017-04-14 — End: 2017-07-11

## 2017-04-14 MED ORDER — SERTRALINE HCL 100 MG PO TABS: 100.0000 mg | ORAL_TABLET | Freq: Every day | ORAL | 12 refills | Status: DC

## 2017-04-14 MED ORDER — BENAZEPRIL HCL 40 MG PO TABS: 40.0000 mg | ORAL_TABLET | Freq: Every day | ORAL | 12 refills | Status: DC

## 2017-04-14 NOTE — Assessment & Plan Note (Signed)
The current medical regimen is effective;  continue present plan and medications.  

## 2017-04-14 NOTE — Assessment & Plan Note (Signed)
A voluntary discussion about advance care planning including the explanation and discussion of advance directives was extensively discussed  with the patient.  Explanation about the health care proxy and Living will was reviewed and packet with forms with explanation of how to fill them out was given.    

## 2017-04-14 NOTE — Assessment & Plan Note (Signed)
For depression will continue Zoloft for chronic anxiety will finish Xanax and start clonazepam 0.5 one a day with one when necessary.  Discuss tapering dosing.

## 2017-04-14 NOTE — Patient Instructions (Signed)
[name] , Thank you for taking time to come for your Medicare Wellness Visit. I appreciate your ongoing commitment to your health goals. Please review the following plan we discussed and let me know if I can assist you in the future.   Screening recommendations/referrals: Colonoscopy: due , will send referral to GI- someone will call you to schedule this. Mammogram: due, Please call [phone] to schedule your mammogram.  Bone Density: completed 03/19/2007 Recommended yearly ophthalmology/optometry visit for glaucoma screening and checkup Recommended yearly dental visit for hygiene and checkup  Vaccinations: Influenza vaccine: done today  Pneumococcal vaccine: up to date Tdap vaccine: up to date Shingles vaccine: due, check with your insurance company for coverage  Advanced directives: Advance directive discussed with you today. I have provided a copy for you to complete at home and have notarized. Once this is complete please bring a copy in to our office so we can scan it into your chart.  Conditions/risks identified: smoking cessation discussed. Recommend drinking at least 5-6 glasses of water a day   Next appointment: Follow up in one year for your annual wellness exam.   Preventive Care 40-64 Years, Female Preventive care refers to lifestyle choices and visits with your health care provider that can promote health and wellness. What does preventive care include?  A yearly physical exam. This is also called an annual well check.  Dental exams once or twice a year.  Routine eye exams. Ask your health care provider how often you should have your eyes checked.  Personal lifestyle choices, including:  Daily care of your teeth and gums.  Regular physical activity.  Eating a healthy diet.  Avoiding tobacco and drug use.  Limiting alcohol use.  Practicing safe sex.  Taking low-dose aspirin daily starting at age 49.  Taking vitamin and mineral supplements as  recommended by your health care provider. What happens during an annual well check? The services and screenings done by your health care provider during your annual well check will depend on your age, overall health, lifestyle risk factors, and family history of disease. Counseling  Your health care provider may ask you questions about your:  Alcohol use.  Tobacco use.  Drug use.  Emotional well-being.  Home and relationship well-being.  Sexual activity.  Eating habits.  Work and work Statistician.  Method of birth control.  Menstrual cycle.  Pregnancy history. Screening  You may have the following tests or measurements:  Height, weight, and BMI.  Blood pressure.  Lipid and cholesterol levels. These may be checked every 5 years, or more frequently if you are over 25 years old.  Skin check.  Lung cancer screening. You may have this screening every year starting at age 29 if you have a 30-pack-year history of smoking and currently smoke or have quit within the past 15 years.  Fecal occult blood test (FOBT) of the stool. You may have this test every year starting at age 74.  Flexible sigmoidoscopy or colonoscopy. You may have a sigmoidoscopy every 5 years or a colonoscopy every 10 years starting at age 19.  Hepatitis C blood test.  Hepatitis B blood test.  Sexually transmitted disease (STD) testing.  Diabetes screening. This is done by checking your blood sugar (glucose) after you have not eaten for a while (fasting). You may have this done every 1-3 years.  Mammogram. This may be done every 1-2 years. Talk to your health care provider about when you should start having regular mammograms. This may depend  on whether you have a family history of breast cancer.  BRCA-related cancer screening. This may be done if you have a family history of breast, ovarian, tubal, or peritoneal cancers.  Pelvic exam and Pap test. This may be done every 3 years starting at age 23.  Starting at age 76, this may be done every 5 years if you have a Pap test in combination with an HPV test.  Bone density scan. This is done to screen for osteoporosis. You may have this scan if you are at high risk for osteoporosis. Discuss your test results, treatment options, and if necessary, the need for more tests with your health care provider. Vaccines  Your health care provider may recommend certain vaccines, such as:  Influenza vaccine. This is recommended every year.  Tetanus, diphtheria, and acellular pertussis (Tdap, Td) vaccine. You may need a Td booster every 10 years.  Zoster vaccine. You may need this after age 79.  Pneumococcal 13-valent conjugate (PCV13) vaccine. You may need this if you have certain conditions and were not previously vaccinated.  Pneumococcal polysaccharide (PPSV23) vaccine. You may need one or two doses if you smoke cigarettes or if you have certain conditions. Talk to your health care provider about which screenings and vaccines you need and how often you need them. This information is not intended to replace advice given to you by your health care provider. Make sure you discuss any questions you have with your health care provider. Document Released: 06/12/2015 Document Revised: 02/03/2016 Document Reviewed: 03/17/2015 Elsevier Interactive Patient Education  2017 [employer] Prevention in the Home Falls can cause injuries. They can happen to people of all ages. There are many things you can do to make your home safe and to help prevent falls. What can I do on the outside of my home?  Regularly fix the edges of walkways and driveways and fix any cracks.  Remove anything that might make you trip as you walk through a door, such as a raised step or threshold.  Trim any bushes or trees on the path to your home.  Use bright outdoor lighting.  Clear any walking paths of anything that might make someone trip, such as rocks or  tools.  Regularly check to see if handrails are loose or broken. Make sure that both sides of any steps have handrails.  Any raised decks and porches should have guardrails on the edges.  Have any leaves, snow, or ice cleared regularly.  Use sand or salt on walking paths during winter.  Clean up any spills in your garage right away. This includes oil or grease spills. What can I do in the bathroom?  Use night lights.  Install grab bars by the toilet and in the tub and shower. Do not use towel bars as grab bars.  Use non-skid mats or decals in the tub or shower.  If you need to sit down in the shower, use a plastic, non-slip stool.  Keep the floor dry. Clean up any water that spills on the floor as soon as it happens.  Remove soap buildup in the tub or shower regularly.  Attach bath mats securely with double-sided non-slip rug tape.  Do not have throw rugs and other things on the floor that can make you trip. What can I do in the bedroom?  Use night lights.  Make sure that you have a light by your bed that is easy to reach.  Do not use any  sheets or blankets that are too big for your bed. They should not hang down onto the floor.  Have a firm chair that has side arms. You can use this for support while you get dressed.  Do not have throw rugs and other things on the floor that can make you trip. What can I do in the kitchen?  Clean up any spills right away.  Avoid walking on wet floors.  Keep items that you use a lot in easy-to-reach places.  If you need to reach something above you, use a strong step stool that has a grab bar.  Keep electrical cords out of the way.  Do not use floor polish or wax that makes floors slippery. If you must use wax, use non-skid floor wax.  Do not have throw rugs and other things on the floor that can make you trip. What can I do with my stairs?  Do not leave any items on the stairs.  Make sure that there are handrails on both  sides of the stairs and use them. Fix handrails that are broken or loose. Make sure that handrails are as long as the stairways.  Check any carpeting to make sure that it is firmly attached to the stairs. Fix any carpet that is loose or worn.  Avoid having throw rugs at the top or bottom of the stairs. If you do have throw rugs, attach them to the floor with carpet tape.  Make sure that you have a light switch at the top of the stairs and the bottom of the stairs. If you do not have them, ask someone to add them for you. What else can I do to help prevent falls?  Wear shoes that:  Do not have high heels.  Have rubber bottoms.  Are comfortable and fit you well.  Are closed at the toe. Do not wear sandals.  If you use a stepladder:  Make sure that it is fully opened. Do not climb a closed stepladder.  Make sure that both sides of the stepladder are locked into place.  Ask someone to hold it for you, if possible.  Clearly mark and make sure that you can see:  Any grab bars or handrails.  First and last steps.  Where the edge of each step is.  Use tools that help you move around (mobility aids) if they are needed. These include:  Canes.  Walkers.  Scooters.  Crutches.  Turn on the lights when you go into a dark area. Replace any light bulbs as soon as they burn out.  Set up your furniture so you have a clear path. Avoid moving your furniture around.  If any of your floors are uneven, fix them.  If there are any pets around you, be aware of where they are.  Review your medicines with your doctor. Some medicines can make you feel dizzy. This can increase your chance of falling. Ask your doctor what other things that you can do to help prevent falls. This information is not intended to replace advice given to you by your health care provider. Make sure you discuss any questions you have with your health care provider. Document Released: 03/12/2009 Document Revised:  10/22/2015 Document Reviewed: 06/20/2014 Elsevier Interactive Patient Education  2017 [employer].

## 2017-04-14 NOTE — Assessment & Plan Note (Signed)
blood pressure has been taking amlodipine 10 mg because blood pressures back up will give prescription for 10 mg

## 2017-04-14 NOTE — Progress Notes (Signed)
Subjective:   Caitlyn May is a 61 y.o. female who presents for Medicare Annual (Subsequent) preventive examination.  Review of Systems:  Cardiac Risk Factors include: advanced age (>5men, >79 women);dyslipidemia;hypertension     Objective:     Vitals: BP 126/77 (BP Location: Left Arm, Patient Position: Sitting)   Pulse 99   Temp 97.8 F (36.6 C) (Temporal)   Ht 5\' 6"  (1.676 m)   Wt 129 lb 1.6 oz (58.6 kg)   BMI 20.84 kg/m   Body mass index is 20.84 kg/m.   Tobacco Social History   Tobacco Use  Smoking Status Current Every Day Smoker  . Packs/day: 2.00  . Types: Cigarettes  Smokeless Tobacco Never Used     Ready to quit: No Counseling given: Yes   Past Medical History:  Diagnosis Date  . Chronic kidney disease   . Depression   . Failed back syndrome, lumbar   . Hyperlipidemia   . Hypertension   . Osteoporosis   . Weight loss    Past Surgical History:  Procedure Laterality Date  . ABDOMINAL HYSTERECTOMY    . CESAREAN SECTION    . MOUTH SURGERY    . SPINE SURGERY     Family History  Problem Relation Age of Onset  . Hypertension Mother   . Dementia Mother   . Cancer Father   . Cancer Maternal Aunt        breast  . Diabetes Neg Hx   . Heart disease Neg Hx   . Stroke Neg Hx   . COPD Neg Hx    Social History   Substance and Sexual Activity  Sexual Activity Not on file    Outpatient Encounter Medications as of 04/14/2017  Medication Sig  . ALPRAZolam (XANAX) 0.5 MG tablet Take 1 tablet (0.5 mg total) 2 (two) times daily as needed by mouth. for anxiety  . atorvastatin (LIPITOR) 40 MG tablet Take 1 tablet (40 mg total) by mouth daily.  Marland Kitchen azithromycin (ZITHROMAX) 250 MG tablet Take 2 tabs day one, then 1 tab daily until complete  . benazepril (LOTENSIN) 40 MG tablet Take 1 tablet (40 mg total) by mouth daily.  . benzonatate (TESSALON) 200 MG capsule Take 1 capsule (200 mg total) 2 (two) times daily as needed by mouth for cough.  .  cetirizine (ZYRTEC) 10 MG tablet Take 1 tablet (10 mg total) by mouth daily.  . cyclobenzaprine (FLEXERIL) 5 MG tablet Take 1 tablet (5 mg total) 2 (two) times daily as needed by mouth for muscle spasms.  Marland Kitchen doxycycline (VIBRA-TABS) 100 MG tablet Take 1 tablet (100 mg total) 2 (two) times daily by mouth.  . ergocalciferol (VITAMIN D2) 50000 units capsule Take 1 capsule (50,000 Units total) by mouth once a week. X 12 weeks.  . metoprolol succinate (TOPROL-XL) 100 MG 24 hr tablet TAKE ONE TABLET BY MOUTH EVERY DAY WITH A MEAL  . ranitidine (ZANTAC) 75 MG tablet Take 1 tablet (75 mg total) by mouth 2 (two) times daily.  . sertraline (ZOLOFT) 100 MG tablet Take 1 tablet (100 mg total) by mouth daily.  Marland Kitchen triamcinolone (NASACORT AQ) 55 MCG/ACT AERO nasal inhaler Place 2 sprays into the nose daily.  Marland Kitchen amLODipine (NORVASC) 5 MG tablet Take 1 tablet (5 mg total) by mouth daily.  . diclofenac sodium (VOLTAREN) 1 % GEL Apply 2 g topically 4 (four) times daily. (Patient not taking: Reported on 04/03/2017)   No facility-administered encounter medications on file as of 04/14/2017.  Activities of Daily Living In your present state of health, do you have any difficulty performing the following activities: 04/14/2017  Hearing? N  Vision? Y  Difficulty concentrating or making decisions? Y  Walking or climbing stairs? Y  Dressing or bathing? N  Doing errands, shopping? N  Preparing Food and eating ? N  Using the Toilet? N  In the past six months, have you accidently leaked urine? Y  Comment pads as needed   Do you have problems with loss of bowel control? N  Managing your Medications? N  Managing your Finances? N  Housekeeping or managing your Housekeeping? N  Some recent data might be hidden    Patient Care Team: Guadalupe Maple, MD as PCP - General (Family Medicine)    Assessment:     Exercise Activities and Dietary recommendations Current Exercise Habits: The patient does not participate in  regular exercise at present, Exercise limited by: orthopedic condition(s)(spine disorder )  Goals    . DIET - INCREASE WATER INTAKE     Recommend drinking at least 5-6 glasses of water a day     . Quit Smoking     Smoking cessation discussed      Fall Risk Fall Risk  04/14/2017 01/11/2017 11/29/2016 09/27/2016 06/27/2016  Falls in the past year? Yes Yes No Yes No  Number falls in past yr: 2 or more 2 or more - 1 -  Injury with Fall? Yes No - No -  Follow up - Falls evaluation completed - - -   Depression Screen PHQ 2/9 Scores 04/14/2017 01/11/2017 11/29/2016 09/14/2016  PHQ - 2 Score 4 0 0 2  PHQ- 9 Score 15 - - 10     Cognitive Function     6CIT Screen 04/14/2017  What Year? 0 points  What month? 0 points  What time? 0 points  Count back from 20 0 points  Months in reverse 0 points  Repeat phrase 0 points  Total Score 0    Immunization History  Administered Date(s) Administered  . Influenza, High Dose Seasonal PF 04/14/2017  . Influenza,inj,Quad PF,6+ Mos 03/31/2015, 02/10/2016  . Influenza-Unspecified 03/11/2014  . Pneumococcal-Unspecified 11/01/2007  . Td 11/01/2007   Screening Tests Health Maintenance  Topic Date Due  . MAMMOGRAM  09/20/2004  . COLONOSCOPY  09/20/2004  . TETANUS/TDAP  10/31/2017  . INFLUENZA VACCINE  Completed  . HIV Screening  Completed  . Hepatitis C Screening  Addressed      Plan:     I have personally reviewed and addressed the Medicare Annual Wellness questionnaire and have noted the following in the patient's chart:  A. Medical and social history B. Use of alcohol, tobacco or illicit drugs  C. Current medications and supplements D. Functional ability and status E.  Nutritional status F.  Physical activity G. Advance directives H. List of other physicians I.  Hospitalizations, surgeries, and ER visits in previous 12 months J.  [place] such as hearing and vision if needed, cognitive and depression L. Referrals and  appointments   In addition, I have reviewed and discussed with patient certain preventive protocols, quality metrics, and best practice recommendations. A written personalized care plan for preventive services as well as general preventive health recommendations were provided to patient.   Signed,  Tyler Aas, LPN Nurse Health Advisor   Nurse Notes:none

## 2017-04-14 NOTE — Progress Notes (Signed)
BP 126/77   Pulse 99   Temp 97.8 F (36.6 C) (Oral)   Ht 5\' 6"  (1.676 m)   Wt 129 lb 1.6 oz (58.6 kg)   SpO2 98%   BMI 20.84 kg/m    Subjective:    Patient ID: Caitlyn May, female    DOB: 1956-03-05, 61 y.o.   MRN: [id number]  HPI: Caitlyn May is a 61 y.o. female  Chief Complaint  Patient presents with  . Annual Exam  patient concerned about lump on the left side of her back next to her spine.'s been there for unknown period of time just noticed a couple of weeks ago. Still working to get back in pain clinic for back Smoking more than ever. patent is having exacerbation of COPD symptoms Patient knows now that smoking cessation would driver [place] because using smoking is a crutch for 8 of pain. Patient's anxiety treated with Xanax 0.5 once a day with an occasional extra tablet. Reviewed most likely pain clinic will not prescribe pain medication with Xanax. Discussed switching to clonazepam and tapering and discontinuing. lood pressure doing okay along with cholesterol medications. Bronchitis treated by Merrie Roof is doing better.  Taking Zoloft for depression anxiety which is doing Relevant past medical, surgical, family and social history reviewed and updated as indicated. Interim medical history since our last visit reviewed. Allergies and medications reviewed and updated.  Review of Systems  Constitutional: Negative.   HENT: Negative.   Eyes: Negative.   Respiratory: Negative.   Cardiovascular: Negative.   Gastrointestinal: Negative.   Endocrine: Negative.   Genitourinary: Negative.   Musculoskeletal: Negative.   Skin: Negative.   Allergic/Immunologic: Negative.   Neurological: Negative.   Hematological: Negative.   Psychiatric/Behavioral: Negative.     Per HPI unless specifically indicated above     Objective:    BP 126/77   Pulse 99   Temp 97.8 F (36.6 C) (Oral)   Ht 5\' 6"  (1.676 m)   Wt 129 lb 1.6 oz (58.6 kg)   SpO2 98%   BMI  20.84 kg/m   Wt Readings from Last 3 Encounters:  04/14/17 129 lb 1.6 oz (58.6 kg)  04/14/17 129 lb 1.6 oz (58.6 kg)  04/03/17 128 lb (58.1 kg)    Physical Exam  Constitutional: She is oriented to person, place, and time. She appears well-developed and well-nourished.  HENT:  Head: Normocephalic and atraumatic.  Right Ear: External ear normal.  Left Ear: External ear normal.  Nose: Nose normal.  Mouth/Throat: Oropharynx is clear and moist.  Eyes: Conjunctivae and EOM are normal. Pupils are equal, round, and reactive to light.  Neck: Normal range of motion. Neck supple. Carotid bruit is not present.  Cardiovascular: Normal rate, regular rhythm and normal heart sounds.  No murmur heard. Pulmonary/Chest: Effort normal and breath sounds normal. She exhibits no mass. Right breast exhibits no mass, no skin change and no tenderness. Left breast exhibits no mass, no skin change and no tenderness. Breasts are symmetrical.  Abdominal: Soft. Bowel sounds are normal. There is no hepatosplenomegaly.  Musculoskeletal: Normal range of motion.  Patient's back left side trigger point identified able to massage it away and stretch it out sodas not identifiable  Neurological: She is alert and oriented to person, place, and time.  Skin: No rash noted.  Psychiatric: She has a normal mood and affect. Her behavior is normal. Judgment and thought content normal.        Assessment & Plan:  Problem List Items Addressed This Visit      Cardiovascular and Mediastinum   Hypertension    blood pressure has been taking amlodipine 10 mg because blood pressures back up will give prescription for 10 mg      Relevant Medications   benazepril (LOTENSIN) 40 MG tablet   atorvastatin (LIPITOR) 40 MG tablet   amLODipine (NORVASC) 10 MG tablet     Other   Postlaminectomy syndrome, lumbar region (Chronic)   Relevant Medications   cyclobenzaprine (FLEXERIL) 5 MG tablet   Depression    For depression will  continue Zoloft for chronic anxiety will finish Xanax and start clonazepam 0.5 one a day with one when necessary.  Discuss tapering dosing.      Relevant Medications   sertraline (ZOLOFT) 100 MG tablet   Hyperlipidemia    The current medical regimen is effective;  continue present plan and medications.       Relevant Medications   benazepril (LOTENSIN) 40 MG tablet   atorvastatin (LIPITOR) 40 MG tablet   amLODipine (NORVASC) 10 MG tablet   Advanced care planning/counseling discussion    A voluntary discussion about advance care planning including the explanation and discussion of advance directives was extensively discussed  with the patient.  Explanation about the health care proxy and Living will was reviewed and packet with forms with explanation of how to fill them out was given.          Other Visit Diagnoses    PE (physical exam), annual    -  Primary       Follow up plan: Return in about 3 months (around 07/15/2017) for To assess anxiety treatment and monitoring.Marland Kitchen

## 2017-04-15 LAB — CBC WITH DIFFERENTIAL/PLATELET
BASOS ABS: 0 10*3/uL (ref 0.0–0.2)
Basos: 1 %
EOS (ABSOLUTE): 0.1 10*3/uL (ref 0.0–0.4)
Eos: 1 %
HEMOGLOBIN: 14.1 g/dL (ref 11.1–15.9)
Hematocrit: 42.7 % (ref 34.0–46.6)
Immature Grans (Abs): 0 10*3/uL (ref 0.0–0.1)
Immature Granulocytes: 0 %
LYMPHS ABS: 2.2 10*3/uL (ref 0.7–3.1)
Lymphs: 28 %
MCH: 31.5 pg (ref 26.6–33.0)
MCHC: 33 g/dL (ref 31.5–35.7)
MCV: 95 fL (ref 79–97)
MONOS ABS: 0.7 10*3/uL (ref 0.1–0.9)
Monocytes: 9 %
NEUTROS ABS: 4.7 10*3/uL (ref 1.4–7.0)
Neutrophils: 61 %
Platelets: 280 10*3/uL (ref 150–379)
RBC: 4.48 x10E6/uL (ref 3.77–5.28)
RDW: 13.3 % (ref 12.3–15.4)
WBC: 7.7 10*3/uL (ref 3.4–10.8)

## 2017-04-15 LAB — LIPID PANEL
CHOL/HDL RATIO: 2.4 ratio (ref 0.0–4.4)
Cholesterol, Total: 133 mg/dL (ref 100–199)
HDL: 55 mg/dL (ref >39)
LDL Calculated: 63 mg/dL (ref 0–99)
Triglycerides: 75 mg/dL (ref 0–149)
VLDL Cholesterol Cal: 15 mg/dL (ref 5–40)

## 2017-04-15 LAB — COMPREHENSIVE METABOLIC PANEL
A/G RATIO: 2 (ref 1.2–2.2)
ALBUMIN: 4.8 g/dL (ref 3.6–4.8)
ALT: 19 IU/L (ref 0–32)
AST: 19 IU/L (ref 0–40)
Alkaline Phosphatase: 92 IU/L (ref 39–117)
BILIRUBIN TOTAL: 0.2 mg/dL (ref 0.0–1.2)
BUN / CREAT RATIO: 9 — AB (ref 12–28)
BUN: 11 mg/dL (ref 8–27)
CALCIUM: 9.3 mg/dL (ref 8.7–10.3)
CHLORIDE: 103 mmol/L (ref 96–106)
CO2: 25 mmol/L (ref 20–29)
Creatinine, Ser: 1.19 mg/dL — ABNORMAL HIGH (ref 0.57–1.00)
GFR, EST AFRICAN AMERICAN: 57 mL/min/{1.73_m2} — AB (ref >59)
GFR, EST NON AFRICAN AMERICAN: 49 mL/min/{1.73_m2} — AB (ref >59)
GLOBULIN, TOTAL: 2.4 g/dL (ref 1.5–4.5)
Glucose: 106 mg/dL — ABNORMAL HIGH (ref 65–99)
POTASSIUM: 4.3 mmol/L (ref 3.5–5.2)
Sodium: 142 mmol/L (ref 134–144)
TOTAL PROTEIN: 7.2 g/dL (ref 6.0–8.5)

## 2017-04-15 LAB — TSH: TSH: 2.46 u[IU]/mL (ref 0.450–4.500)

## 2017-04-17 ENCOUNTER — Encounter: Payer: Self-pay | Admitting: Family Medicine

## 2017-04-24 ENCOUNTER — Other Ambulatory Visit: Payer: Self-pay | Admitting: Family Medicine

## 2017-05-03 ENCOUNTER — Other Ambulatory Visit: Payer: Self-pay | Admitting: Family Medicine

## 2017-05-18 ENCOUNTER — Other Ambulatory Visit: Payer: Self-pay | Admitting: Family Medicine

## 2017-05-25 ENCOUNTER — Ambulatory Visit: Payer: Self-pay

## 2017-05-25 NOTE — Telephone Encounter (Signed)
Phone call from pt. Stated she was treated for a sinus infection with 2 rounds of antibiotics, and has had shortness of breath over past month. Today, reported she feels more labored in her breathing, and has had increased wheezing over past few days.  Reported clear nasal drainage, and is coughing-up clear to light brown mucus, intermittently.  Denied fever; denied chest pain, but then stated her "chest feels heavier with the chest congestion."  Reported her breathing felt more short during night, last night, and continued for longer interval.  Pt. questioned if she could be having side effect of the Clonazepam, since that medication was started about the time the shortness of breath started.  Denies feeling any resp. distress during the triage call.  Pt. checked pulse during call; reported "106", per automatic BP machine.  Advised the protocol recommended that she be seen within 4 hours. The pt. is requesting an appt. tomorrow; stated she doesn't feel she needs to go to the ER.  Care advice given per protocol.  Verb. Understanding; agreed with plan.          Reason for Disposition . [1] MILD difficulty breathing (e.g., minimal/no SOB at rest, SOB with walking, pulse <100) AND [2] NEW-onset or WORSE than normal  Answer Assessment - Initial Assessment Questions 1. RESPIRATORY STATUS: "Describe your breathing?" (e.g., wheezing, shortness of breath, unable to speak, severe coughing)      Feels labored and wheezing with breathing  2. ONSET: "When did this breathing problem begin?"      About one month 3. PATTERN "Does the difficult breathing come and go, or has it been constant since it started?"      Feels like it comes and goes, but has episodes of labored breathing that will continue for awhile 4. SEVERITY: "How bad is your breathing?" (e.g., mild, moderate, severe)    - MILD: No SOB at rest, mild SOB with walking, speaks normally in sentences, can lay down, no retractions, pulse < 100.    - MODERATE:  SOB at rest, SOB with minimal exertion and prefers to sit, cannot lie down flat, speaks in phrases, mild retractions, audible wheezing, pulse 100-120.    - SEVERE: Very SOB at rest, speaks in single words, struggling to breathe, sitting hunched forward, retractions, pulse > 120      Moderate breathing difficulty 5. RECURRENT SYMPTOM: "Have you had difficulty breathing before?" If so, ask: "When was the last time?" and "What happened that time?"      Never experienced this before.  6. CARDIAC HISTORY: "Do you have any history of heart disease?" (e.g., heart attack, angina, bypass surgery, angioplasty)      None of above 7. LUNG HISTORY: "Do you have any history of lung disease?"  (e.g., pulmonary embolus, asthma, emphysema)     None of above  8. CAUSE: "What do you think is causing the breathing problem?"      Started new medications recently; Clonazepam 0.5 mg was started one month ago.  Reported is taking this daily, but occas. Takes 2/day.    9. OTHER SYMPTOMS: "Do you have any other symptoms? (e.g., dizziness, runny nose, cough, chest pain, fever)     Coughing clear - light brown mucus at times; feels like chest becoming congested x 3-4 days ago; c/o heavy feeling in chest since congestion started.  C/o runny nose with clear mucus drainage.  Reported was on antibiotic x 2 rounds for sinus infection; denies fever  10. PREGNANCY: "Is there any chance  you are pregnant?" "When was your last menstrual period?"       No  11. TRAVEL: "Have you traveled out of the country in the last month?" (e.g., travel history, exposures)      no  Protocols used: BREATHING DIFFICULTY-A-AH

## 2017-05-26 ENCOUNTER — Encounter: Payer: Self-pay | Admitting: Family Medicine

## 2017-05-26 ENCOUNTER — Ambulatory Visit (INDEPENDENT_AMBULATORY_CARE_PROVIDER_SITE_OTHER): Payer: Medicare Other | Admitting: Family Medicine

## 2017-05-26 VITALS — BP 162/92 | HR 92 | Temp 97.6°F | Wt 126.0 lb

## 2017-05-26 DIAGNOSIS — I10 Essential (primary) hypertension: Secondary | ICD-10-CM

## 2017-05-26 DIAGNOSIS — J069 Acute upper respiratory infection, unspecified: Secondary | ICD-10-CM | POA: Diagnosis not present

## 2017-05-26 MED ORDER — PREDNISONE 10 MG PO TABS: ORAL_TABLET | ORAL | 0 refills | Status: DC

## 2017-05-26 MED ORDER — METOPROLOL SUCCINATE ER 50 MG PO TB24: 50.0000 mg | ORAL_TABLET | Freq: Every day | ORAL | 0 refills | Status: DC

## 2017-05-26 MED ORDER — AZITHROMYCIN 250 MG PO TABS: ORAL_TABLET | ORAL | 0 refills | Status: DC

## 2017-05-26 MED ORDER — ALBUTEROL SULFATE HFA 108 (90 BASE) MCG/ACT IN AERS: 2.0000 | INHALATION_SPRAY | Freq: Four times a day (QID) | RESPIRATORY_TRACT | 3 refills | Status: DC | PRN

## 2017-05-26 NOTE — Progress Notes (Signed)
BP (!) 162/92 (BP Location: Right Arm, Patient Position: Sitting, Cuff Size: Normal)   Pulse 92   Temp 97.6 F (36.4 C) (Oral)   Wt 126 lb (57.2 kg)   SpO2 96%   BMI 20.34 kg/m    Subjective:    Patient ID: Caitlyn May, female    DOB: [birth year], 61 y.o.   MRN: [id number]  HPI: Caitlyn May is a 61 y.o. female  Chief Complaint  Patient presents with  . Shortness of Breath    Patient states this all started about a month ago. But her shortness of breath has increased x's 4 days.  . Wheezing  . Cough    Productive, patient states it's brown in color.   Breathing has been progressively getting worse the past 5 or so days. Wheezing, SOB, productive cough with brown sputum, fatigue. Smoking 2 ppd, states this is more than usual due to her pain and anxiety that are currently under poor control. Last spirometry was 30-40 years ago with no pulmonary dz findings, hasn't had much issue since other than when sick. Having multiple flares like this a year recently. Not trying anything OTC currently. Denies fevers, chills, body aches, CP.   Relevant past medical, surgical, family and social history reviewed and updated as indicated. Interim medical history since our last visit reviewed. Allergies and medications reviewed and updated.  Review of Systems  Constitutional: Positive for fatigue.  HENT: Positive for congestion.   Eyes: Negative.   Respiratory: Positive for cough, chest tightness, shortness of breath and wheezing.   Cardiovascular: Negative.   Gastrointestinal: Negative.   Genitourinary: Negative.   Musculoskeletal: Negative.   Neurological: Negative.   Psychiatric/Behavioral: Negative.    Per HPI unless specifically indicated above     Objective:    BP (!) 162/92 (BP Location: Right Arm, Patient Position: Sitting, Cuff Size: Normal)   Pulse 92   Temp 97.6 F (36.4 C) (Oral)   Wt 126 lb (57.2 kg)   SpO2 96%   BMI 20.34 kg/m   Wt Readings from Last 3  Encounters:  05/26/17 126 lb (57.2 kg)  04/14/17 129 lb 1.6 oz (58.6 kg)  04/14/17 129 lb 1.6 oz (58.6 kg)    Physical Exam  Constitutional: She is oriented to person, place, and time. No distress.  Underweight  HENT:  Head: Atraumatic.  Right Ear: External ear normal.  Left Ear: External ear normal.  Nose: Nose normal.  Oropharynx injected  Eyes: Conjunctivae are normal. Pupils are equal, round, and reactive to light. No scleral icterus.  Neck: Normal range of motion. Neck supple.  Cardiovascular: Normal rate and normal heart sounds.  Pulmonary/Chest: Effort normal. No respiratory distress. She has wheezes. She has no rales.  Musculoskeletal: Normal range of motion.  Neurological: She is alert and oriented to person, place, and time.  Skin: Skin is warm and dry.  Psychiatric: She has a normal mood and affect. Her behavior is normal.  Nursing note and vitals reviewed.   Results for orders placed or performed in visit on 04/14/17  Microscopic Examination  Result Value Ref Range   WBC, UA 0-5 0 - 5 /hpf   RBC, UA 0-2 0 - 2 /hpf   Epithelial Cells (non renal) 0-10 0 - 10 /hpf   Bacteria, UA Moderate (A) None seen/Few  Comprehensive metabolic panel  Result Value Ref Range   Glucose 106 (H) 65 - 99 mg/dL   BUN 11 8 - 27 mg/dL   Creatinine,  Ser 1.19 (H) 0.57 - 1.00 mg/dL   GFR calc non Af Amer 49 (L) >59 mL/min/1.73   GFR calc Af Amer 57 (L) >59 mL/min/1.73   BUN/Creatinine Ratio 9 (L) 12 - 28   Sodium 142 134 - 144 mmol/L   Potassium 4.3 3.5 - 5.2 mmol/L   Chloride 103 96 - 106 mmol/L   CO2 25 20 - 29 mmol/L   Calcium 9.3 8.7 - 10.3 mg/dL   Total Protein 7.2 6.0 - 8.5 g/dL   Albumin 4.8 3.6 - 4.8 g/dL   Globulin, Total 2.4 1.5 - 4.5 g/dL   Albumin/Globulin Ratio 2.0 1.2 - 2.2   Bilirubin Total 0.2 0.0 - 1.2 mg/dL   Alkaline Phosphatase 92 39 - 117 IU/L   AST 19 0 - 40 IU/L   ALT 19 0 - 32 IU/L  Lipid panel  Result Value Ref Range   Cholesterol, Total 133 100 - 199  mg/dL   Triglycerides 75 0 - 149 mg/dL   HDL 55 >39 mg/dL   VLDL Cholesterol Cal 15 5 - 40 mg/dL   LDL Calculated 63 0 - 99 mg/dL   Chol/HDL Ratio 2.4 0.0 - 4.4 ratio  CBC with Differential/Platelet  Result Value Ref Range   WBC 7.7 3.4 - 10.8 x10E3/uL   RBC 4.48 3.77 - 5.28 x10E6/uL   Hemoglobin 14.1 11.1 - 15.9 g/dL   Hematocrit 42.7 34.0 - 46.6 %   MCV 95 79 - 97 fL   MCH 31.5 26.6 - 33.0 pg   MCHC 33.0 31.5 - 35.7 g/dL   RDW 13.3 12.3 - 15.4 %   Platelets 280 150 - 379 x10E3/uL   Neutrophils 61 Not Estab. %   Lymphs 28 Not Estab. %   Monocytes 9 Not Estab. %   Eos 1 Not Estab. %   Basos 1 Not Estab. %   Neutrophils Absolute 4.7 1.4 - 7.0 x10E3/uL   Lymphocytes Absolute 2.2 0.7 - 3.1 x10E3/uL   Monocytes Absolute 0.7 0.1 - 0.9 x10E3/uL   EOS (ABSOLUTE) 0.1 0.0 - 0.4 x10E3/uL   Basophils Absolute 0.0 0.0 - 0.2 x10E3/uL   Immature Granulocytes 0 Not Estab. %   Immature Grans (Abs) 0.0 0.0 - 0.1 x10E3/uL  TSH  Result Value Ref Range   TSH 2.460 0.450 - 4.500 uIU/mL  Urinalysis, Routine w reflex microscopic  Result Value Ref Range   Specific Gravity, UA <1.005 (L) 1.005 - 1.030   pH, UA 5.5 5.0 - 7.5   Color, UA Straw Yellow   Appearance Ur Hazy (A) Clear   Leukocytes, UA Trace (A) Negative   Protein, UA Negative Negative/Trace   Glucose, UA Negative Negative   Ketones, UA Negative Negative   RBC, UA Trace (A) Negative   Bilirubin, UA Negative Negative   Urobilinogen, Ur 0.2 0.2 - 1.0 mg/dL   Nitrite, UA Negative Negative   Microscopic Examination See below:       Assessment & Plan:   Problem List Items Addressed This Visit      Cardiovascular and Mediastinum   Hypertension    Has been out of her metoprolol 25 mg, states that is why her BPs are running high currently. Some confusion as this was not mentioned or renewed during her recent CPE with PCP. Will send him a note to review her medications.       Relevant Medications   metoprolol succinate (TOPROL XL)  50 MG 24 hr tablet    Other Visit Diagnoses  Upper respiratory tract infection, unspecified type    -  Primary   Suspect underlying COPD exac. Will treat with zpack, prednisone, and tessalon perles. OTC remedies reviewed, supportive care. F/u if no better   Relevant Medications   azithromycin (ZITHROMAX) 250 MG tablet    Pt would benefit from a repeat spirometry once current flare resolves at next OV to check for underlying pulmonary dz, particularly given significantly increased smoking rates the past year or so since she's been out of the pain clinic's care.   Follow up plan: Return for as scheduled.

## 2017-05-29 NOTE — Patient Instructions (Signed)
Follow up as scheduled.  

## 2017-05-29 NOTE — Assessment & Plan Note (Signed)
Has been out of her metoprolol 25 mg, states that is why her BPs are running high currently. Some confusion as this was not mentioned or renewed during her recent CPE with PCP. Will send him a note to review her medications.

## 2017-06-02 ENCOUNTER — Ambulatory Visit: Payer: Medicare Other | Admitting: Family Medicine

## 2017-06-05 ENCOUNTER — Telehealth: Payer: Self-pay
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
## 2017-06-07 ENCOUNTER — Ambulatory Visit (INDEPENDENT_AMBULATORY_CARE_PROVIDER_SITE_OTHER): Payer: Medicare Other | Admitting: Family Medicine

## 2017-06-07 ENCOUNTER — Other Ambulatory Visit: Payer: Self-pay

## 2017-06-07 VITALS — BP 104/72 | HR 93 | Temp 97.6°F | Wt 129.2 lb

## 2017-06-07 DIAGNOSIS — R197 Diarrhea, unspecified: Secondary | ICD-10-CM

## 2017-06-07 DIAGNOSIS — R05 Cough: Secondary | ICD-10-CM | POA: Diagnosis not present

## 2017-06-07 DIAGNOSIS — R059 Cough, unspecified: Secondary | ICD-10-CM

## 2017-06-07 MED ORDER — BENZONATATE 200 MG PO CAPS: 200.0000 mg | ORAL_CAPSULE | Freq: Two times a day (BID) | ORAL | 0 refills | Status: DC | PRN

## 2017-06-07 NOTE — Progress Notes (Signed)
BP 104/72 (BP Location: Right Arm, Patient Position: Sitting)   Pulse 93   Temp 97.6 F (36.4 C) (Oral)   Wt 129 lb 3.2 oz (58.6 kg)   SpO2 99%   BMI 20.85 kg/m    Subjective:    Patient ID: Caitlyn May, female    DOB: 07-10-1955, 62 y.o.   MRN: [id number]  HPI: Caitlyn May is a 62 y.o. female  Chief Complaint  Patient presents with  . Follow-up    wheezing   Pt here for f/u URI with significant wheezing and cough. Completed abx and prednisone with good relief, as well as tessalon perles. Would like refill on tessalon perles for lingering cough. Denies any fevers, SOB, CP.   Diarrhea 3-5 times daily with foul odor since comleting abx. Lots of abdominal cramping but no fevers, melena, vomiting. Has been trying to stay hydrated but not taking anything for sxs.   Past Medical History:  Diagnosis Date  . Chronic kidney disease   . Depression   . Failed back syndrome, lumbar   . Hyperlipidemia   . Hypertension   . Osteoporosis   . Weight loss    Social History   Socioeconomic History  . Marital status: Married    Spouse name: Not on file  . Number of children: Not on file  . Years of education: 31  . Highest education level: 10th grade  Social Needs  . Financial resource strain: Hard  . Food insecurity - worry: Never true  . Food insecurity - inability: Sometimes true  . Transportation needs - medical: No  . Transportation needs - non-medical: No  Occupational History  . Not on file  Tobacco Use  . Smoking status: Current Every Day Smoker    Packs/day: 2.00    Types: Cigarettes  . Smokeless tobacco: Never Used  Substance and Sexual Activity  . Alcohol use: No  . Drug use: No  . Sexual activity: Not on file  Other Topics Concern  . Not on file  Social History Narrative  . Not on file    Relevant past medical, surgical, family and social history reviewed and updated as indicated. Interim medical history since our last visit  reviewed. Allergies and medications reviewed and updated.  Review of Systems  Constitutional: Negative.   HENT: Negative.   Eyes: Negative.   Respiratory: Positive for cough.   Cardiovascular: Negative.   Gastrointestinal: Positive for abdominal pain and diarrhea.  Genitourinary: Negative.   Musculoskeletal: Negative.   Skin: Negative.   Neurological: Negative.   Psychiatric/Behavioral: Negative.    Per HPI unless specifically indicated above     Objective:    BP 104/72 (BP Location: Right Arm, Patient Position: Sitting)   Pulse 93   Temp 97.6 F (36.4 C) (Oral)   Wt 129 lb 3.2 oz (58.6 kg)   SpO2 99%   BMI 20.85 kg/m   Wt Readings from Last 3 Encounters:  06/07/17 129 lb 3.2 oz (58.6 kg)  05/26/17 126 lb (57.2 kg)  04/14/17 129 lb 1.6 oz (58.6 kg)    Physical Exam  Constitutional: She is oriented to person, place, and time. She appears well-developed and well-nourished.  HENT:  Head: Atraumatic.  Eyes: Conjunctivae are normal. Pupils are equal, round, and reactive to light.  Neck: Normal range of motion. Neck supple.  Cardiovascular: Normal rate and normal heart sounds.  Pulmonary/Chest: Effort normal and breath sounds normal. No respiratory distress. She has no wheezes.  Abdominal: Soft. Bowel  sounds are normal. She exhibits no distension. There is no tenderness.  Musculoskeletal: Normal range of motion.  Neurological: She is alert and oriented to person, place, and time.  Skin: Skin is warm and dry.  Psychiatric: She has a normal mood and affect. Her behavior is normal.  Nursing note and vitals reviewed.  Results for orders placed or performed in visit on 04/14/17  Microscopic Examination  Result Value Ref Range   WBC, UA 0-5 0 - 5 /hpf   RBC, UA 0-2 0 - 2 /hpf   Epithelial Cells (non renal) 0-10 0 - 10 /hpf   Bacteria, UA Moderate (A) None seen/Few  Comprehensive metabolic panel  Result Value Ref Range   Glucose 106 (H) 65 - 99 mg/dL   BUN 11 8 - 27  mg/dL   Creatinine, Ser 1.19 (H) 0.57 - 1.00 mg/dL   GFR calc non Af Amer 49 (L) >59 mL/min/1.73   GFR calc Af Amer 57 (L) >59 mL/min/1.73   BUN/Creatinine Ratio 9 (L) 12 - 28   Sodium 142 134 - 144 mmol/L   Potassium 4.3 3.5 - 5.2 mmol/L   Chloride 103 96 - 106 mmol/L   CO2 25 20 - 29 mmol/L   Calcium 9.3 8.7 - 10.3 mg/dL   Total Protein 7.2 6.0 - 8.5 g/dL   Albumin 4.8 3.6 - 4.8 g/dL   Globulin, Total 2.4 1.5 - 4.5 g/dL   Albumin/Globulin Ratio 2.0 1.2 - 2.2   Bilirubin Total 0.2 0.0 - 1.2 mg/dL   Alkaline Phosphatase 92 39 - 117 IU/L   AST 19 0 - 40 IU/L   ALT 19 0 - 32 IU/L  Lipid panel  Result Value Ref Range   Cholesterol, Total 133 100 - 199 mg/dL   Triglycerides 75 0 - 149 mg/dL   HDL 55 >39 mg/dL   VLDL Cholesterol Cal 15 5 - 40 mg/dL   LDL Calculated 63 0 - 99 mg/dL   Chol/HDL Ratio 2.4 0.0 - 4.4 ratio  CBC with Differential/Platelet  Result Value Ref Range   WBC 7.7 3.4 - 10.8 x10E3/uL   RBC 4.48 3.77 - 5.28 x10E6/uL   Hemoglobin 14.1 11.1 - 15.9 g/dL   Hematocrit 42.7 34.0 - 46.6 %   MCV 95 79 - 97 fL   MCH 31.5 26.6 - 33.0 pg   MCHC 33.0 31.5 - 35.7 g/dL   RDW 13.3 12.3 - 15.4 %   Platelets 280 150 - 379 x10E3/uL   Neutrophils 61 Not Estab. %   Lymphs 28 Not Estab. %   Monocytes 9 Not Estab. %   Eos 1 Not Estab. %   Basos 1 Not Estab. %   Neutrophils Absolute 4.7 1.4 - 7.0 x10E3/uL   Lymphocytes Absolute 2.2 0.7 - 3.1 x10E3/uL   Monocytes Absolute 0.7 0.1 - 0.9 x10E3/uL   EOS (ABSOLUTE) 0.1 0.0 - 0.4 x10E3/uL   Basophils Absolute 0.0 0.0 - 0.2 x10E3/uL   Immature Granulocytes 0 Not Estab. %   Immature Grans (Abs) 0.0 0.0 - 0.1 x10E3/uL  TSH  Result Value Ref Range   TSH 2.460 0.450 - 4.500 uIU/mL  Urinalysis, Routine w reflex microscopic  Result Value Ref Range   Specific Gravity, UA <1.005 (L) 1.005 - 1.030   pH, UA 5.5 5.0 - 7.5   Color, UA Straw Yellow   Appearance Ur Hazy (A) Clear   Leukocytes, UA Trace (A) Negative   Protein, UA Negative  Negative/Trace   Glucose, UA Negative  Negative   Ketones, UA Negative Negative   RBC, UA Trace (A) Negative   Bilirubin, UA Negative Negative   Urobilinogen, Ur 0.2 0.2 - 1.0 mg/dL   Nitrite, UA Negative Negative   Microscopic Examination See below:       Assessment & Plan:   Problem List Items Addressed This Visit    None    Visit Diagnoses    Diarrhea of presumed infectious origin    -  Primary   Concern for c. diff given sxs and recent antibiotic use. Start probiotics BID, push fluids, await stool testing.    Relevant Orders   Stool C-Diff Toxin Assay   Cough       Refill tessalon perles, continue inhalers. F/u if worsening sxs       Follow up plan: Return for as scheduled.

## 2017-06-10 ENCOUNTER — Encounter: Payer: Self-pay | Admitting: Family Medicine

## 2017-06-10 NOTE — Patient Instructions (Signed)
Follow up as scheduled.  

## 2017-06-25 ENCOUNTER — Other Ambulatory Visit: Payer: Self-pay | Admitting: Family Medicine

## 2017-06-27 ENCOUNTER — Other Ambulatory Visit: Payer: Medicare Other

## 2017-06-27 DIAGNOSIS — R197 Diarrhea, unspecified: Secondary | ICD-10-CM | POA: Diagnosis not present

## 2017-06-28 ENCOUNTER — Ambulatory Visit: Payer: Medicare Other | Admitting: Family Medicine

## 2017-06-29 LAB — CLOSTRIDIUM DIFFICILE EIA: C difficile Toxins A+B, EIA: NEGATIVE

## 2017-06-29 NOTE — Progress Notes (Signed)
LVM for patient that results came back negative. Explained to patient if she had any questions to give [place] a call.

## 2017-07-11 ENCOUNTER — Other Ambulatory Visit: Payer: Self-pay | Admitting: Family Medicine

## 2017-07-11 ENCOUNTER — Other Ambulatory Visit: Payer: Self-pay

## 2017-07-18 ENCOUNTER — Encounter: Payer: Self-pay | Admitting: Family Medicine

## 2017-07-18 ENCOUNTER — Ambulatory Visit (INDEPENDENT_AMBULATORY_CARE_PROVIDER_SITE_OTHER): Payer: Medicare Other | Admitting: Family Medicine

## 2017-07-18 DIAGNOSIS — I10 Essential (primary) hypertension: Secondary | ICD-10-CM

## 2017-07-18 DIAGNOSIS — R634 Abnormal weight loss: Secondary | ICD-10-CM | POA: Diagnosis not present

## 2017-07-18 MED ORDER — CLONAZEPAM 0.5 MG PO TABS: 0.5000 mg | ORAL_TABLET | Freq: Two times a day (BID) | ORAL | 5 refills | Status: DC | PRN

## 2017-07-18 NOTE — Progress Notes (Signed)
   BP 117/74   Pulse 70   Wt 124 lb (56.2 kg)   SpO2 99%   BMI 20.01 kg/m    Subjective:    Patient ID: Caitlyn May, female    DOB: 1956/05/23, 63 y.o.   MRN: [id number]  HPI: Caitlyn May is a 62 y.o. female  Chief Complaint  Patient presents with  . Follow-up  Patient follow-up has fallen on her coccyx and has a lot of discomfort this was a week ago still going on is tried Tylenol ice heat pillows etc. which seems to help but it has only been a week no radicular pain or symptoms.  Patient still having some diarrhea reviewed C. difficile test was negative.  Patient will use Imodium and elimination diet.  Patient also with a great deal of stress is been taking clonazepam pretty much every day and sometimes 2 every day. Also coughing very hard also coughing very hard. Wants symbicort samples Continued wt loss.    Relevant past medical, surgical, family and social history reviewed and updated as indicated. Interim medical history since our last visit reviewed. Allergies and medications reviewed and updated.  Review of Systems  Constitutional: Negative.   Respiratory: Negative.   Cardiovascular: Negative.     Per HPI unless specifically indicated above     Objective:    BP 117/74   Pulse 70   Wt 124 lb (56.2 kg)   SpO2 99%   BMI 20.01 kg/m   Wt Readings from Last 3 Encounters:  07/18/17 124 lb (56.2 kg)  06/07/17 129 lb 3.2 oz (58.6 kg)  05/26/17 126 lb (57.2 kg)    Physical Exam  Constitutional: She is oriented to person, place, and time. She appears well-developed and well-nourished.  HENT:  Head: Normocephalic and atraumatic.  Eyes: Conjunctivae and EOM are normal.  Neck: Normal range of motion.  Cardiovascular: Normal rate, regular rhythm and normal heart sounds.  Pulmonary/Chest: Effort normal and breath sounds normal.  Musculoskeletal: Normal range of motion.  Neurological: She is alert and oriented to person, place, and time.  Skin: No  erythema.  Psychiatric: She has a normal mood and affect. Her behavior is normal. Judgment and thought content normal.    Results for orders placed or performed in visit on 06/07/17  Stool C-Diff Toxin Assay  Result Value Ref Range   C difficile Toxins A+B, EIA Negative Negative      Assessment & Plan:   Problem List Items Addressed This Visit      Cardiovascular and Mediastinum   Hypertension    For hypertension with a least 2 near syncope episodes with low blood pressure will decrease blood pressure medication observe for excursions.  For now will stop amlodipine.        Other   Weight loss    weight loss low blood pressure and multitude of other conditions.  Certainly stress is a common denominator. Discussed nutrition and need for calories. We will check blood work to assess for any abnormalities that could lead to an explanation.      Relevant Orders   CBC with Differential/Platelet   Comprehensive metabolic panel     Discussed will not use prednisone as patient is requesting because of concerns from prednisone.    Follow up plan: Return in about 4 weeks (around 08/15/2017).

## 2017-07-18 NOTE — Assessment & Plan Note (Signed)
For hypertension with a least 2 near syncope episodes with low blood pressure will decrease blood pressure medication observe for excursions.  For now will stop amlodipine.

## 2017-07-18 NOTE — Assessment & Plan Note (Signed)
weight loss low blood pressure and multitude of other conditions.  Certainly stress is a common denominator. Discussed nutrition and need for calories. We will check blood work to assess for any abnormalities that could lead to an explanation.

## 2017-07-19 ENCOUNTER — Encounter: Payer: Self-pay | Admitting: Family Medicine

## 2017-07-19 LAB — COMPREHENSIVE METABOLIC PANEL
A/G RATIO: 1.4 (ref 1.2–2.2)
ALT: 20 IU/L (ref 0–32)
AST: 23 IU/L (ref 0–40)
Albumin: 4.1 g/dL (ref 3.6–4.8)
Alkaline Phosphatase: 123 IU/L — ABNORMAL HIGH (ref 39–117)
BILIRUBIN TOTAL: 0.3 mg/dL (ref 0.0–1.2)
BUN / CREAT RATIO: 5 — AB (ref 12–28)
BUN: 6 mg/dL — ABNORMAL LOW (ref 8–27)
CHLORIDE: 102 mmol/L (ref 96–106)
CO2: 23 mmol/L (ref 20–29)
Calcium: 9.1 mg/dL (ref 8.7–10.3)
Creatinine, Ser: 1.11 mg/dL — ABNORMAL HIGH (ref 0.57–1.00)
GFR calc non Af Amer: 53 mL/min/{1.73_m2} — ABNORMAL LOW (ref >59)
GFR, EST AFRICAN AMERICAN: 62 mL/min/{1.73_m2} (ref >59)
Globulin, Total: 2.9 g/dL (ref 1.5–4.5)
Glucose: 86 mg/dL (ref 65–99)
POTASSIUM: 4.1 mmol/L (ref 3.5–5.2)
SODIUM: 137 mmol/L (ref 134–144)
TOTAL PROTEIN: 7 g/dL (ref 6.0–8.5)

## 2017-07-19 LAB — CBC WITH DIFFERENTIAL/PLATELET
BASOS: 1 %
Basophils Absolute: 0 10*3/uL (ref 0.0–0.2)
EOS (ABSOLUTE): 0.1 10*3/uL (ref 0.0–0.4)
EOS: 2 %
HEMATOCRIT: 41.5 % (ref 34.0–46.6)
Hemoglobin: 14.6 g/dL (ref 11.1–15.9)
IMMATURE GRANS (ABS): 0 10*3/uL (ref 0.0–0.1)
IMMATURE GRANULOCYTES: 0 %
LYMPHS: 17 %
Lymphocytes Absolute: 0.9 10*3/uL (ref 0.7–3.1)
MCH: 32.3 pg (ref 26.6–33.0)
MCHC: 35.2 g/dL (ref 31.5–35.7)
MCV: 92 fL (ref 79–97)
Monocytes Absolute: 0.7 10*3/uL (ref 0.1–0.9)
Monocytes: 13 %
NEUTROS ABS: 3.5 10*3/uL (ref 1.4–7.0)
NEUTROS PCT: 67 %
PLATELETS: 273 10*3/uL (ref 150–379)
RBC: 4.52 x10E6/uL (ref 3.77–5.28)
RDW: 13.2 % (ref 12.3–15.4)
WBC: 5.2 10*3/uL (ref 3.4–10.8)

## 2017-08-22 ENCOUNTER — Encounter: Payer: Self-pay | Admitting: Family Medicine

## 2017-08-22 ENCOUNTER — Ambulatory Visit (INDEPENDENT_AMBULATORY_CARE_PROVIDER_SITE_OTHER): Payer: Medicare Other | Admitting: Family Medicine

## 2017-08-22 VITALS — BP 182/98 | HR 75 | Temp 98.4°F | Ht 66.0 in | Wt 122.2 lb

## 2017-08-22 DIAGNOSIS — J45909 Unspecified asthma, uncomplicated: Secondary | ICD-10-CM | POA: Diagnosis not present

## 2017-08-22 DIAGNOSIS — Z1239 Encounter for other screening for malignant neoplasm of breast: Secondary | ICD-10-CM

## 2017-08-22 DIAGNOSIS — F3342 Major depressive disorder, recurrent, in full remission: Secondary | ICD-10-CM | POA: Diagnosis not present

## 2017-08-22 DIAGNOSIS — Z1231 Encounter for screening mammogram for malignant neoplasm of breast: Secondary | ICD-10-CM | POA: Diagnosis not present

## 2017-08-22 DIAGNOSIS — D492 Neoplasm of unspecified behavior of bone, soft tissue, and skin: Secondary | ICD-10-CM | POA: Insufficient documentation

## 2017-08-22 DIAGNOSIS — M899 Disorder of bone, unspecified: Secondary | ICD-10-CM | POA: Diagnosis not present

## 2017-08-22 DIAGNOSIS — I10 Essential (primary) hypertension: Secondary | ICD-10-CM | POA: Diagnosis not present

## 2017-08-22 NOTE — Progress Notes (Signed)
BP (!) 182/98 (BP Location: Left Arm, Patient Position: Sitting, Cuff Size: Small)   Pulse 75   Temp 98.4 F (36.9 C) (Oral)   Ht 5\' 6"  (1.676 m)   Wt 122 lb 4 oz (55.5 kg)   SpO2 96%   BMI 19.73 kg/m    Subjective:    Patient ID: Caitlyn May, female    DOB: 05/05/1956, 62 y.o.   MRN: [id number]  HPI: Caitlyn May is a 62 y.o. female  Chief Complaint  Patient presents with  . Follow-up    weight loss and hypertension.   Follow-up all in all doing well still having a great deal of stress and trouble keeping food down.  Stress is going to be going out the door hopefully in April.  Patient should be able to get back to normal lifestyle including eating and keeping food down. Otherwise stable with medication. Patient also especially concerned about allergies has Zyrtec and Nasacort but burns when she uses discussed allergy care and treatment.  Patient also with mass adjacent to cervical 7 approximately 4-5 cm around firm movable has gotten more tender and seems to be enlarging.  This is gotten more tender even to the point with shampooing her hair touching this area become sensitive.  Relevant past medical, surgical, family and social history reviewed and updated as indicated. Interim medical history since our last visit reviewed. Allergies and medications reviewed and updated.  Review of Systems  Constitutional: Negative.   Respiratory: Negative.   Cardiovascular: Negative.     Per HPI unless specifically indicated above     Objective:    BP (!) 182/98 (BP Location: Left Arm, Patient Position: Sitting, Cuff Size: Small)   Pulse 75   Temp 98.4 F (36.9 C) (Oral)   Ht 5\' 6"  (1.676 m)   Wt 122 lb 4 oz (55.5 kg)   SpO2 96%   BMI 19.73 kg/m   Wt Readings from Last 3 Encounters:  08/22/17 122 lb 4 oz (55.5 kg)  07/18/17 124 lb (56.2 kg)  06/07/17 129 lb 3.2 oz (58.6 kg)    Physical Exam  Constitutional: She is oriented to person, place, and time. She  appears well-developed and well-nourished.  HENT:  Head: Normocephalic and atraumatic.  Eyes: Conjunctivae and EOM are normal.  Neck: Normal range of motion.  Cardiovascular: Normal rate, regular rhythm and normal heart sounds.  Pulmonary/Chest: Effort normal and breath sounds normal.  Musculoskeletal: Normal range of motion.  Neurological: She is alert and oriented to person, place, and time.  Skin: No erythema.  Small tumor adjacent to C7 on the left enlarging and tender.  Psychiatric: She has a normal mood and affect. Her behavior is normal. Judgment and thought content normal.    Results for orders placed or performed in visit on 07/18/17  CBC with Differential/Platelet  Result Value Ref Range   WBC 5.2 3.4 - 10.8 x10E3/uL   RBC 4.52 3.77 - 5.28 x10E6/uL   Hemoglobin 14.6 11.1 - 15.9 g/dL   Hematocrit 41.5 34.0 - 46.6 %   MCV 92 79 - 97 fL   MCH 32.3 26.6 - 33.0 pg   MCHC 35.2 31.5 - 35.7 g/dL   RDW 13.2 12.3 - 15.4 %   Platelets 273 150 - 379 x10E3/uL   Neutrophils 67 Not Estab. %   Lymphs 17 Not Estab. %   Monocytes 13 Not Estab. %   Eos 2 Not Estab. %   Basos 1 Not Estab. %  Neutrophils Absolute 3.5 1.4 - 7.0 x10E3/uL   Lymphocytes Absolute 0.9 0.7 - 3.1 x10E3/uL   Monocytes Absolute 0.7 0.1 - 0.9 x10E3/uL   EOS (ABSOLUTE) 0.1 0.0 - 0.4 x10E3/uL   Basophils Absolute 0.0 0.0 - 0.2 x10E3/uL   Immature Granulocytes 0 Not Estab. %   Immature Grans (Abs) 0.0 0.0 - 0.1 x10E3/uL  Comprehensive metabolic panel  Result Value Ref Range   Glucose 86 65 - 99 mg/dL   BUN 6 (L) 8 - 27 mg/dL   Creatinine, Ser 1.11 (H) 0.57 - 1.00 mg/dL   GFR calc non Af Amer 53 (L) >59 mL/min/1.73   GFR calc Af Amer 62 >59 mL/min/1.73   BUN/Creatinine Ratio 5 (L) 12 - 28   Sodium 137 134 - 144 mmol/L   Potassium 4.1 3.5 - 5.2 mmol/L   Chloride 102 96 - 106 mmol/L   CO2 23 20 - 29 mmol/L   Calcium 9.1 8.7 - 10.3 mg/dL   Total Protein 7.0 6.0 - 8.5 g/dL   Albumin 4.1 3.6 - 4.8 g/dL    Globulin, Total 2.9 1.5 - 4.5 g/dL   Albumin/Globulin Ratio 1.4 1.2 - 2.2   Bilirubin Total 0.3 0.0 - 1.2 mg/dL   Alkaline Phosphatase 123 (H) 39 - 117 IU/L   AST 23 0 - 40 IU/L   ALT 20 0 - 32 IU/L      Assessment & Plan:   Problem List Items Addressed This Visit      Cardiovascular and Mediastinum   Hypertension    Discussed blood pressure elevated but last time blood pressure was too low patient should be starting eating will observe blood pressure and if staying elevated will start back amlodipine 5 mg rechecking blood pressure in 1-2 months.        Respiratory   Asthma due to environmental allergies    Gas allergy care and treatment avoidance as possible nasal rinse, Flonase, and Zyrtec all used faithfully.        Musculoskeletal and Integument   Cervical spine tumor    Discuss tumor adjacent to cervical spine will get MRI to further characterize.      Relevant Orders   MR Cervical Spine Wo Contrast     Other   Depression    Discuss depression nerves anxiety should be getting better within the month and patient should be able to do better with eating and nutrition will observe.       Other Visit Diagnoses    Breast cancer screening    -  Primary   Relevant Orders   MM DIGITAL SCREENING BILATERAL   Disorder of bone           Follow up plan: Return in about 3 months (around 11/22/2017) for BMP,  Lipids, ALT, AST.

## 2017-08-22 NOTE — Assessment & Plan Note (Signed)
Discuss depression nerves anxiety should be getting better within the month and patient should be able to do better with eating and nutrition will observe.

## 2017-08-22 NOTE — Assessment & Plan Note (Signed)
Discuss tumor adjacent to cervical spine will get MRI to further characterize.

## 2017-08-22 NOTE — Assessment & Plan Note (Signed)
Gas allergy care and treatment avoidance as possible nasal rinse, Flonase, and Zyrtec all used faithfully.

## 2017-08-22 NOTE — Assessment & Plan Note (Addendum)
Discussed blood pressure elevated but last time blood pressure was too low patient should be starting eating will observe blood pressure and if staying elevated will start back amlodipine 5 mg rechecking blood pressure in 1-2 months.

## 2017-08-30 ENCOUNTER — Telehealth: Payer: Self-pay | Admitting: Family Medicine

## 2017-08-30 DIAGNOSIS — R221 Localized swelling, mass and lump, neck: Secondary | ICD-10-CM

## 2017-08-30 NOTE — Telephone Encounter (Signed)
Copied from [facility] [phone]. Topic: Quick Communication - See Telephone Encounter >> Aug 30, 2017  1:37 PM Rutherford Nail, NT wrote: CRM for notification. See Telephone encounter for: 08/30/17.  Aldona Bar from MRI at [facility] would like clarification on MRI order for tomorrow. Need order to corrected to with and without. Also, needing to know if you would like to include a soft tissue because if the mass is not in the cervical spine, they will need the soft tissue to see the mass. However, if the mass is in the cervical spine, it will be seen on the cervical MRI. Please call back at [phone]

## 2017-08-31 ENCOUNTER — Ambulatory Visit
Admission: RE | Admit: 2017-08-31 | Discharge: 2017-08-31 | Disposition: A | Discharge status: Home / Self Care | Payer: Medicare Other | Source: Ambulatory Visit | Attending: Family Medicine | Admitting: Family Medicine

## 2017-08-31 DIAGNOSIS — M4802 Spinal stenosis, cervical region: Secondary | ICD-10-CM | POA: Diagnosis not present

## 2017-08-31 DIAGNOSIS — M542 Cervicalgia: Secondary | ICD-10-CM | POA: Diagnosis not present

## 2017-08-31 DIAGNOSIS — R221 Localized swelling, mass and lump, neck: Secondary | ICD-10-CM | POA: Insufficient documentation

## 2017-08-31 LAB — POCT I-STAT CREATININE: CREATININE: 1 mg/dL (ref 0.44–1.00)

## 2017-08-31 MED ORDER — GADOBENATE DIMEGLUMINE 529 MG/ML IV SOLN
10.0000 mL | Freq: Once | INTRAVENOUS | Status: AC | PRN
  Administered 2017-08-31: 10 mL via INTRAVENOUS

## 2017-08-31 NOTE — Telephone Encounter (Signed)
Called Caitlyn May back from Imaging.  She stated that MRI of cervical spine will only identify mass ON the spine, if it is to the left or right they would not see it. If it's believed to be to the left or right of spine it needs to be an MRI of the soft tissue/neck, either way order needs to be with and without contrast.

## 2017-08-31 NOTE — Telephone Encounter (Signed)
Called and let Caitlyn May know order was changed.

## 2017-08-31 NOTE — Telephone Encounter (Signed)
I reviewed MAC's OV note and my impression is that it isn't thought to be on the spine - order changed to soft tissue neck MRI w wo contrast

## 2017-09-12 ENCOUNTER — Other Ambulatory Visit: Payer: Self-pay | Admitting: Family Medicine

## 2017-09-20 ENCOUNTER — Ambulatory Visit: Payer: Medicare Other | Admitting: Family Medicine

## 2017-09-21 ENCOUNTER — Ambulatory Visit: Payer: Medicare Other | Admitting: Family Medicine

## 2017-09-25 ENCOUNTER — Ambulatory Visit (INDEPENDENT_AMBULATORY_CARE_PROVIDER_SITE_OTHER): Payer: Medicare Other | Admitting: Family Medicine

## 2017-09-25 ENCOUNTER — Encounter: Payer: Self-pay | Admitting: Family Medicine

## 2017-09-25 VITALS — BP 147/91 | HR 66 | Temp 97.6°F | Wt 118.0 lb

## 2017-09-25 DIAGNOSIS — J309 Allergic rhinitis, unspecified: Secondary | ICD-10-CM | POA: Diagnosis not present

## 2017-09-25 DIAGNOSIS — M6740 Ganglion, unspecified site: Secondary | ICD-10-CM

## 2017-09-25 MED ORDER — MONTELUKAST SODIUM 10 MG PO TABS: 10.0000 mg | ORAL_TABLET | Freq: Every day | ORAL | 6 refills | Status: DC

## 2017-09-25 MED ORDER — PREDNISONE 10 MG PO TABS: ORAL_TABLET | ORAL | 0 refills | Status: DC

## 2017-09-25 NOTE — Patient Instructions (Addendum)
Take nasal spray and zyrtec twice daily, and add singulair once daily. Continue mucinex, sinus rinses, humidifiers.  Can start prednisone taper     Ganglion Cyst A ganglion cyst is a noncancerous, fluid-filled lump that occurs near joints or tendons. The ganglion cyst grows out of a joint or the lining of a tendon. It most often develops in the hand or wrist, but it can also develop in the shoulder, elbow, hip, knee, ankle, or foot. The round or oval ganglion cyst can be the size of a pea or larger than a grape. Increased activity may enlarge the size of the cyst because more fluid starts to build up. What are the causes? It is not known what causes a ganglion cyst to grow. However, it may be related to:  Inflammation or irritation around the joint.  An injury.  Repetitive movements or overuse.  Arthritis.  What increases the risk? Risk factors include:  Being a woman.  Being age 51-50.  What are the signs or symptoms? Symptoms may include:  A lump. This most often appears on the hand or wrist, but it can occur in other areas of the body.  Tingling.  Pain.  Numbness.  Muscle weakness.  Weak grip.  Less movement in a joint.  How is this diagnosed? Ganglion cysts are most often diagnosed based on a physical exam. Your health care provider will feel the lump and may shine a light alongside it. If it is a ganglion cyst, a light often shines through it. Your health care provider may order an X-ray, ultrasound, or MRI to rule out other conditions. How is this treated? Ganglion cysts usually go away on their own without treatment. If pain or other symptoms are involved, treatment may be needed. Treatment is also needed if the ganglion cyst limits your movement or if it gets infected. Treatment may include:  Wearing a brace or splint on your wrist or finger.  Taking anti-inflammatory medicine.  Draining fluid from the lump with a needle (aspiration).  Injecting a steroid  into the joint.  Surgery to remove the ganglion cyst.  Follow these instructions at home:  Do not press on the ganglion cyst, poke it with a needle, or hit it.  Take medicines only as directed by your health care provider.  Wear your brace or splint as directed by your health care provider.  Watch your ganglion cyst for any changes.  Keep all follow-up visits as directed by your health care provider. This is important. Contact a health care provider if:  Your ganglion cyst becomes larger or more painful.  You have increased redness, red streaks, or swelling.  You have pus coming from the lump.  You have weakness or numbness in the affected area.  You have a fever or chills. This information is not intended to replace advice given to you by your health care provider. Make sure you discuss any questions you have with your health care provider. Document Released: 05/13/2000 Document Revised: 10/22/2015 Document Reviewed: 10/29/2013 Elsevier Interactive Patient Education  2018 [employer].

## 2017-09-25 NOTE — Progress Notes (Signed)
BP (!) 147/91 (BP Location: Left Arm, Patient Position: Sitting, Cuff Size: Small)   Pulse 66   Temp 97.6 F (36.4 C) (Oral)   Wt 118 lb (53.5 kg)   SpO2 99%   BMI 19.05 kg/m    Subjective:    Patient ID: Caitlyn May, female    DOB: 1955/07/26, 62 y.o.   MRN: [id number]  HPI: Caitlyn May is a 62 y.o. female  Chief Complaint  Patient presents with  . Knot    Knot on left wrist. Ongoing for 2 weeks. Patient states it's hard as a rock.   Pt here today with a firm mass that appeared on left wrist about 2 weeks ago. Minimal changes in size, not painful or movement restricting. Has not been trying anything other than squeezing it hoping to drain it. No known injury or inciting event. Has never had this happen before.   Also continuing to struggle significantly with allergy sxs and sinus issues. Taking zyrtec and flonase with minimal relief. Denies fevers, chills, body aches, CP, SOB. Rhinorrhea, facial pressure, sore throat from post-nasal drainage, cough.   Past Medical History:  Diagnosis Date  . Chronic kidney disease   . Depression   . Failed back syndrome, lumbar   . Hyperlipidemia   . Hypertension   . Osteoporosis   . Weight loss    Social History   Socioeconomic History  . Marital status: Married    Spouse name: Not on file  . Number of children: Not on file  . Years of education: 65  . Highest education level: 10th grade  Occupational History  . Not on file  Social Needs  . Financial resource strain: Hard  . Food insecurity:    Worry: Never true    Inability: Sometimes true  . Transportation needs:    Medical: No    Non-medical: No  Tobacco Use  . Smoking status: Current Every Day Smoker    Packs/day: 2.00    Types: Cigarettes  . Smokeless tobacco: Never Used  Substance and Sexual Activity  . Alcohol use: No  . Drug use: No  . Sexual activity: Not on file  Lifestyle  . Physical activity:    Days per week: 0 days    Minutes per session: 0  min  . Stress: Rather much  Relationships  . Social connections:    Talks on phone: More than three times a week    Gets together: Three times a week    Attends religious service: Never    Active member of club or organization: No    Attends meetings of clubs or organizations: Never    Relationship status: Living with partner  . Intimate partner violence:    Fear of current or ex partner: No    Emotionally abused: No    Physically abused: No    Forced sexual activity: No  Other Topics Concern  . Not on file  Social History Narrative  . Not on file    Relevant past medical, surgical, family and social history reviewed and updated as indicated. Interim medical history since our last visit reviewed. Allergies and medications reviewed and updated.  Review of Systems  Per HPI unless specifically indicated above     Objective:    BP (!) 147/91 (BP Location: Left Arm, Patient Position: Sitting, Cuff Size: Small)   Pulse 66   Temp 97.6 F (36.4 C) (Oral)   Wt 118 lb (53.5 kg)   SpO2 99%   BMI  19.05 kg/m   Wt Readings from Last 3 Encounters:  09/25/17 118 lb (53.5 kg)  08/22/17 122 lb 4 oz (55.5 kg)  07/18/17 124 lb (56.2 kg)    Physical Exam  Constitutional: She is oriented to person, place, and time. She appears well-developed and well-nourished. No distress.  HENT:  Head: Atraumatic.  Eyes: Pupils are equal, round, and reactive to light. Conjunctivae are normal.  Neck: Normal range of motion. Neck supple.  Cardiovascular: Normal rate, normal heart sounds and intact distal pulses.  Pulmonary/Chest: Breath sounds normal.  Musculoskeletal: Normal range of motion. She exhibits no tenderness (Mass non-tender to palpation).  Firm ganglion cyst present lateral wrist, non-tender, not erythematous. ROM and strength intact  Neurological: She is alert and oriented to person, place, and time.  Neurovascularly intact b/l UEs  Skin: Skin is warm and dry.  Psychiatric: She has a  normal mood and affect. Her behavior is normal.  Nursing note and vitals reviewed.   Results for orders placed or performed during the hospital encounter of 08/31/17  I-STAT creatinine  Result Value Ref Range   Creatinine, Ser 1.00 0.44 - 1.00 mg/dL      Assessment & Plan:   Problem List Items Addressed This Visit      Respiratory   Allergic rhinitis    Increase current allergy regimen to BID flonase and zyrtec, add singulair QD. WIll start prednisone to help with current flare sxs.        Other Visit Diagnoses    Ganglion cyst    -  Primary   Reassurance and mgmt options given, pt opting to monitor for now as it's not painful or restrictive. Will let us know if she decides she wants it removed        Follow up plan: Return for as scheduled.

## 2017-09-28 DIAGNOSIS — J309 Allergic rhinitis, unspecified: Secondary | ICD-10-CM | POA: Insufficient documentation

## 2017-09-28 NOTE — Assessment & Plan Note (Signed)
Increase current allergy regimen to BID flonase and zyrtec, add singulair QD. WIll start prednisone to help with current flare sxs.

## 2017-10-02 ENCOUNTER — Other Ambulatory Visit: Payer: Self-pay | Admitting: Family Medicine

## 2017-10-03 ENCOUNTER — Other Ambulatory Visit: Payer: Self-pay | Admitting: Family Medicine

## 2017-10-03 NOTE — Telephone Encounter (Signed)
Your patient 

## 2017-10-12 ENCOUNTER — Telehealth: Payer: Self-pay

## 2017-10-12 MED ORDER — METOPROLOL SUCCINATE ER 50 MG PO TB24: ORAL_TABLET | ORAL | 1 refills | Status: DC

## 2017-10-12 NOTE — Telephone Encounter (Signed)
Please send a 90 day supply of Metoprolol per insurance

## 2017-11-02 ENCOUNTER — Ambulatory Visit (INDEPENDENT_AMBULATORY_CARE_PROVIDER_SITE_OTHER): Payer: Medicare Other | Admitting: Family Medicine

## 2017-11-02 ENCOUNTER — Encounter: Payer: Self-pay | Admitting: Family Medicine

## 2017-11-02 VITALS — BP 150/81 | HR 67 | Temp 98.1°F | Ht 66.0 in | Wt 122.7 lb

## 2017-11-02 DIAGNOSIS — R300 Dysuria: Secondary | ICD-10-CM | POA: Diagnosis not present

## 2017-11-02 DIAGNOSIS — L259 Unspecified contact dermatitis, unspecified cause: Secondary | ICD-10-CM

## 2017-11-02 LAB — URINALYSIS, ROUTINE W REFLEX MICROSCOPIC
BILIRUBIN UA: NEGATIVE
Glucose, UA: NEGATIVE
Ketones, UA: NEGATIVE
Nitrite, UA: NEGATIVE
PH UA: 5 (ref 5.0–7.5)
Protein, UA: NEGATIVE
Specific Gravity, UA: <1.005 — ABNORMAL LOW (ref 1.005–1.030)
Urobilinogen, Ur: 0.2 mg/dL (ref 0.2–1.0)

## 2017-11-02 LAB — MICROSCOPIC EXAMINATION
BACTERIA UA: NONE SEEN
RBC, UA: NONE SEEN /hpf (ref 0–2)

## 2017-11-02 MED ORDER — VALACYCLOVIR HCL 1 G PO TABS: 1000.0000 mg | ORAL_TABLET | Freq: Two times a day (BID) | ORAL | 0 refills | Status: DC

## 2017-11-02 MED ORDER — PREDNISONE 10 MG PO TABS: ORAL_TABLET | ORAL | 0 refills | Status: DC

## 2017-11-02 NOTE — Progress Notes (Signed)
BP (!) 150/81 (BP Location: Left Arm, Patient Position: Sitting, Cuff Size: Normal)   Pulse 67   Temp 98.1 F (36.7 C) (Oral)   Ht 5\' 6"  (1.676 m)   Wt 122 lb 11.2 oz (55.7 kg)   SpO2 97%   BMI 19.80 kg/m    Subjective:    Patient ID: Caitlyn May, female    DOB: 12-25-1955, 62 y.o.   MRN: [id number]  HPI: Caitlyn May is a 62 y.o. female  Chief Complaint  Patient presents with  . Possible Shingles    Left side of neck and chest. Itching, burning and painful. Ongoing for 2 days.   Patient with some slight inflammation on the left neck area no classic changes of shingles. Patient also with frequency urgency dysuria.  Ongoing for about 3 days seem to be getting worse. Blood pressure slightly up today but has been taking medications stable.  Relevant past medical, surgical, family and social history reviewed and updated as indicated. Interim medical history since our last visit reviewed. Allergies and medications reviewed and updated.  Review of Systems  Constitutional: Negative.   Respiratory: Negative.   Cardiovascular: Negative.     Per HPI unless specifically indicated above     Objective:    BP (!) 150/81 (BP Location: Left Arm, Patient Position: Sitting, Cuff Size: Normal)   Pulse 67   Temp 98.1 F (36.7 C) (Oral)   Ht 5\' 6"  (1.676 m)   Wt 122 lb 11.2 oz (55.7 kg)   SpO2 97%   BMI 19.80 kg/m   Wt Readings from Last 3 Encounters:  11/02/17 122 lb 11.2 oz (55.7 kg)  09/25/17 118 lb (53.5 kg)  08/22/17 122 lb 4 oz (55.5 kg)    Physical Exam  Constitutional: She is oriented to person, place, and time. She appears well-developed and well-nourished.  HENT:  Head: Normocephalic and atraumatic.  Eyes: Conjunctivae and EOM are normal.  Neck: Normal range of motion.  Cardiovascular: Normal rate, regular rhythm and normal heart sounds.  Pulmonary/Chest: Effort normal and breath sounds normal.  Musculoskeletal: Normal range of motion.  Neurological: She  is alert and oriented to person, place, and time.  Skin: No erythema.  Mildly red patches left neck area.  Psychiatric: She has a normal mood and affect. Her behavior is normal. Judgment and thought content normal.    Results for orders placed or performed in visit on 11/02/17  Microscopic Examination  Result Value Ref Range   WBC, UA 0-5 0 - 5 /hpf   RBC, UA None seen 0 - 2 /hpf   Epithelial Cells (non renal) 0-10 0 - 10 /hpf   Bacteria, UA None seen None seen/Few  Urinalysis, Routine w reflex microscopic  Result Value Ref Range   Specific Gravity, UA <1.005 (L) 1.005 - 1.030   pH, UA 5.0 5.0 - 7.5   Color, UA Yellow Yellow   Appearance Ur Clear Clear   Leukocytes, UA Trace (A) Negative   Protein, UA Negative Negative/Trace   Glucose, UA Negative Negative   Ketones, UA Negative Negative   RBC, UA Trace (A) Negative   Bilirubin, UA Negative Negative   Urobilinogen, Ur 0.2 0.2 - 1.0 mg/dL   Nitrite, UA Negative Negative   Microscopic Examination See below:       Assessment & Plan:   Problem List Items Addressed This Visit    None    Visit Diagnoses    Dysuria    -  Primary  Relevant Orders   Urinalysis, Routine w reflex microscopic (Completed)   Contact dermatitis, unspecified contact dermatitis type, unspecified trigger          Discussed care and treatment contact dermatitis triamcinolone cream patient education given.  We will continue blood pressure medications.  Follow up plan: Return if symptoms worsen or fail to improve, for As scheduled.

## 2017-11-06 ENCOUNTER — Encounter: Payer: Self-pay | Admitting: Family Medicine

## 2017-11-14 ENCOUNTER — Other Ambulatory Visit: Payer: Self-pay | Admitting: Family Medicine

## 2017-11-14 DIAGNOSIS — M961 Postlaminectomy syndrome, not elsewhere classified: Secondary | ICD-10-CM

## 2017-11-16 NOTE — Telephone Encounter (Signed)
Patient called to follow up on request for med refill  For   cyclobenzaprine (FLEXERIL) 5 MG tablet   patient was told this has been requested since last week , by the pharmacy

## 2017-11-16 NOTE — Telephone Encounter (Signed)
Pt called in to follow up on refill request. Pt says that she is down to 1 pill.   Please make pt aware when Rx has been sent to pharmacy.   CB: [phone]   Pharmacy:  [facility], [place], [place] - [place] [phone] (Phone) [phone] (Fax)

## 2017-11-16 NOTE — Telephone Encounter (Signed)
Interface refill Request for Flexeril 5 mg.  1 BID # 60      LOV 04/14/17 with Dr.  Jeananne Rama   Pharmacy: [facility]  Last refill 10/12/17

## 2017-11-28 ENCOUNTER — Ambulatory Visit: Payer: Medicare Other | Admitting: Family Medicine

## 2017-12-11 ENCOUNTER — Ambulatory Visit (INDEPENDENT_AMBULATORY_CARE_PROVIDER_SITE_OTHER): Payer: Medicare Other | Admitting: Family Medicine

## 2017-12-11 ENCOUNTER — Encounter: Payer: Self-pay | Admitting: Family Medicine

## 2017-12-11 VITALS — BP 138/80 | HR 72 | Ht 66.0 in | Wt 126.6 lb

## 2017-12-11 DIAGNOSIS — M961 Postlaminectomy syndrome, not elsewhere classified: Secondary | ICD-10-CM

## 2017-12-11 DIAGNOSIS — I10 Essential (primary) hypertension: Secondary | ICD-10-CM

## 2017-12-11 DIAGNOSIS — R55 Syncope and collapse: Secondary | ICD-10-CM | POA: Diagnosis not present

## 2017-12-11 DIAGNOSIS — E782 Mixed hyperlipidemia: Secondary | ICD-10-CM | POA: Diagnosis not present

## 2017-12-11 LAB — LP+ALT+AST PICCOLO, WAIVED
ALT (SGPT) PICCOLO, WAIVED: 32 U/L (ref 10–47)
AST (SGOT) Piccolo, Waived: 27 U/L (ref 11–38)
CHOL/HDL RATIO PICCOLO,WAIVE: 2.7 mg/dL
CHOLESTEROL PICCOLO, WAIVED: 163 mg/dL (ref <200)
HDL CHOL PICCOLO, WAIVED: 61 mg/dL (ref >59)
LDL CHOL CALC PICCOLO WAIVED: 69 mg/dL (ref <100)
TRIGLYCERIDES PICCOLO,WAIVED: 167 mg/dL — AB (ref <150)
VLDL CHOL CALC PICCOLO,WAIVE: 33 mg/dL — AB (ref <30)

## 2017-12-11 MED ORDER — CYCLOBENZAPRINE HCL 5 MG PO TABS: 5.0000 mg | ORAL_TABLET | Freq: Every day | ORAL | 2 refills | Status: DC | PRN

## 2017-12-11 NOTE — Assessment & Plan Note (Signed)
The current medical regimen is effective;  continue present plan and medications.  

## 2017-12-11 NOTE — Assessment & Plan Note (Signed)
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 Reviewed cardiology notes from last summer and essentially normal.  With normal cardiac echo. Patient still not driving encouraged not to drive until further work-up. Will refer to neurology to further evaluate.

## 2017-12-11 NOTE — Progress Notes (Signed)
BP 138/80 (BP Location: Left Arm)   Pulse 72   Ht 5\' 6"  (1.676 m)   Wt 126 lb 9.6 oz (57.4 kg)   SpO2 98%   BMI 20.43 kg/m    Subjective:    Patient ID: Caitlyn May, female    DOB: 01/10/1956, 62 y.o.   MRN: [id number]  HPI: Caitlyn May is a 62 y.o. female  Chief Complaint  Patient presents with  . Follow-up  . Hypertension  . Hyperlipidemia  . Loss of Consciousness    Patient reports "blacking out" again x 3. Pt blames nerves.   On July 3 patient had for blacking out spells.  These lasted for only seconds were observed a couple of times.  Patient with no warning no prodrome or other type signs or symptoms of palpitations lightheaded dizzy what ever. Upon awakening drained of all energy which lasted 10 to 15 minutes and then felt back to normal.  As a consequence has not been driving because of concerns.  No seizure type activities were witnessed by witness. This was associated with putting her dog of 11 years to sleep that day.  Patient had these issues previously and really feels that stress may be a trigger.  Relevant past medical, surgical, family and social history reviewed and updated as indicated. Interim medical history since our last visit reviewed. Allergies and medications reviewed and updated.  Review of Systems  Constitutional: Negative.   Respiratory: Negative.   Cardiovascular: Negative.     Per HPI unless specifically indicated above     Objective:    BP 138/80 (BP Location: Left Arm)   Pulse 72   Ht 5\' 6"  (1.676 m)   Wt 126 lb 9.6 oz (57.4 kg)   SpO2 98%   BMI 20.43 kg/m   Wt Readings from Last 3 Encounters:  12/11/17 126 lb 9.6 oz (57.4 kg)  11/02/17 122 lb 11.2 oz (55.7 kg)  09/25/17 118 lb (53.5 kg)    Physical Exam  Constitutional: She is oriented to person, place, and time. She appears well-developed and well-nourished.  HENT:  Head: Normocephalic and atraumatic.  Eyes: Conjunctivae and EOM are normal.  Neck: Normal range of  motion.  Cardiovascular: Normal rate, regular rhythm and normal heart sounds.  Pulmonary/Chest: Effort normal and breath sounds normal.  Musculoskeletal: Normal range of motion.  Neurological: She is alert and oriented to person, place, and time.  Skin: No erythema.  Psychiatric: She has a normal mood and affect. Her behavior is normal. Judgment and thought content normal.    Results for orders placed or performed in visit on 11/02/17  Microscopic Examination  Result Value Ref Range   WBC, UA 0-5 0 - 5 /hpf   RBC, UA None seen 0 - 2 /hpf   Epithelial Cells (non renal) 0-10 0 - 10 /hpf   Bacteria, UA None seen None seen/Few  Urinalysis, Routine w reflex microscopic  Result Value Ref Range   Specific Gravity, UA <1.005 (L) 1.005 - 1.030   pH, UA 5.0 5.0 - 7.5   Color, UA Yellow Yellow   Appearance Ur Clear Clear   Leukocytes, UA Trace (A) Negative   Protein, UA Negative Negative/Trace   Glucose, UA Negative Negative   Ketones, UA Negative Negative   RBC, UA Trace (A) Negative   Bilirubin, UA Negative Negative   Urobilinogen, Ur 0.2 0.2 - 1.0 mg/dL   Nitrite, UA Negative Negative   Microscopic Examination See below:  Assessment & Plan:   Problem List Items Addressed This Visit      Cardiovascular and Mediastinum   Hypertension - Primary    The current medical regimen is effective;  continue present plan and medications.       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 Reviewed cardiology notes from last summer and essentially normal.  With normal cardiac echo. Patient still not driving encouraged not to drive until further work-up. Will refer to neurology to further evaluate.      Relevant Orders   Ambulatory referral to Neurology     Other   Postlaminectomy syndrome, lumbar region (Chronic)   Relevant Medications   cyclobenzaprine (FLEXERIL) 5 MG tablet    Hyperlipidemia   Relevant Orders   Basic metabolic panel   LP+ALT+AST Piccolo, Waived       Follow up plan: Return for Nov    Physical Exam.

## 2017-12-12 ENCOUNTER — Encounter: Payer: Self-pay | Admitting: Family Medicine

## 2017-12-12 LAB — BASIC METABOLIC PANEL
BUN / CREAT RATIO: 9 — AB (ref 12–28)
BUN: 9 mg/dL (ref 8–27)
CALCIUM: 8.6 mg/dL — AB (ref 8.7–10.3)
CHLORIDE: 106 mmol/L (ref 96–106)
CO2: 24 mmol/L (ref 20–29)
Creatinine, Ser: 0.95 mg/dL (ref 0.57–1.00)
GFR calc Af Amer: 74 mL/min/{1.73_m2} (ref >59)
GFR calc non Af Amer: 64 mL/min/{1.73_m2} (ref >59)
GLUCOSE: 93 mg/dL (ref 65–99)
POTASSIUM: 4.2 mmol/L (ref 3.5–5.2)
Sodium: 143 mmol/L (ref 134–144)

## 2018-02-13 ENCOUNTER — Other Ambulatory Visit: Payer: Self-pay

## 2018-02-13 MED ORDER — CLONAZEPAM 0.5 MG PO TABS: 0.5000 mg | ORAL_TABLET | Freq: Every day | ORAL | 0 refills | Status: DC | PRN

## 2018-02-13 MED ORDER — CLONAZEPAM 0.5 MG PO TABS: 0.5000 mg | ORAL_TABLET | Freq: Two times a day (BID) | ORAL | 0 refills | Status: DC | PRN

## 2018-02-13 NOTE — Telephone Encounter (Signed)
Patient last seen 12/11/17 and has f/up 04/19/18.

## 2018-02-13 NOTE — Telephone Encounter (Signed)
Reviewed MACs note from 03/2017 - he had discussed with patient tapering and coming off the klonopin. Wrote for 1 daily prn for one month #30 and then 1 daily prn #20 for the next month prior to her scheduled follow up

## 2018-02-16 ENCOUNTER — Telehealth: Payer: Self-pay | Admitting: Family Medicine

## 2018-02-16 NOTE — Telephone Encounter (Signed)
Needs appointment to discuss this with blood work

## 2018-02-16 NOTE — Telephone Encounter (Signed)
Copied from [place] [id number]. Topic: Appointment Scheduling - Scheduling Inquiry for Clinic >> Feb 16, 2018  2:00 PM Caitlyn May L wrote: Reason for CRM:   Pt calling because she wants the doctor to start giving her B12 injections.  Pt can be reached at [phone]

## 2018-02-16 NOTE — Telephone Encounter (Signed)
Please get patient scheduled.  °

## 2018-02-19 NOTE — Telephone Encounter (Signed)
LVM for patient to call back to schedule an appointment to discuss the change in her B12 injections. Please schedule if patient calls back   Thank you

## 2018-03-14 ENCOUNTER — Ambulatory Visit (INDEPENDENT_AMBULATORY_CARE_PROVIDER_SITE_OTHER): Payer: Medicare Other | Admitting: Family Medicine

## 2018-03-14 ENCOUNTER — Encounter: Payer: Self-pay | Admitting: Family Medicine

## 2018-03-14 VITALS — BP 144/91 | HR 112 | Temp 98.5°F | Wt 118.0 lb

## 2018-03-14 DIAGNOSIS — J069 Acute upper respiratory infection, unspecified: Secondary | ICD-10-CM

## 2018-03-14 NOTE — Progress Notes (Signed)
BP (!) 144/91   Pulse (!) 112   Temp 98.5 F (36.9 C) (Oral)   Wt 118 lb (53.5 kg)   SpO2 98%   BMI 19.05 kg/m    Subjective:    Patient ID: Caitlyn May, female    DOB: 04-24-1956, 62 y.o.   MRN: [id number]  HPI: Caitlyn May is a 62 y.o. female  Chief Complaint  Patient presents with  . Sinus Problem    Saturday stated w/ feverish, sore throat, R ear pain   Fevers, chills, sweats, sore throat, right ear pain, dizziness x 4 days. Taking some tylenol with no relief and regularly takes mucinex. Denies CP, SOB, wheezing, anorexia, cough. No sick contacts recently. Current every day smoker.   Relevant past medical, surgical, family and social history reviewed and updated as indicated. Interim medical history since our last visit reviewed. Allergies and medications reviewed and updated.  Review of Systems  Per HPI unless specifically indicated above     Objective:    BP (!) 144/91   Pulse (!) 112   Temp 98.5 F (36.9 C) (Oral)   Wt 118 lb (53.5 kg)   SpO2 98%   BMI 19.05 kg/m   Wt Readings from Last 3 Encounters:  03/14/18 118 lb (53.5 kg)  12/11/17 126 lb 9.6 oz (57.4 kg)  11/02/17 122 lb 11.2 oz (55.7 kg)    Physical Exam  Constitutional: She is oriented to person, place, and time. She appears well-developed and well-nourished. No distress.  HENT:  Head: Atraumatic.  Right Ear: External ear normal.  Left Ear: External ear normal.  Oropharynx and nasal mucosa erythematous and edematous  Eyes: Conjunctivae and EOM are normal.  Neck: Normal range of motion. Neck supple.  Cardiovascular: Regular rhythm.  Mildly tachycardic  Pulmonary/Chest: Effort normal and breath sounds normal. No respiratory distress. She has no wheezes.  Neurological: She is alert and oriented to person, place, and time.  Skin: Skin is warm and dry.  Psychiatric: She has a normal mood and affect. Her behavior is normal.  Nursing note and vitals reviewed.   Results for orders  placed or performed in visit on 85/88/50  Basic metabolic panel  Result Value Ref Range   Glucose 93 65 - 99 mg/dL   BUN 9 8 - 27 mg/dL   Creatinine, Ser 0.95 0.57 - 1.00 mg/dL   GFR calc non Af Amer 64 >59 mL/min/1.73   GFR calc Af Amer 74 >59 mL/min/1.73   BUN/Creatinine Ratio 9 (L) 12 - 28   Sodium 143 134 - 144 mmol/L   Potassium 4.2 3.5 - 5.2 mmol/L   Chloride 106 96 - 106 mmol/L   CO2 24 20 - 29 mmol/L   Calcium 8.6 (L) 8.7 - 10.3 mg/dL  LP+ALT+AST Piccolo, Waived  Result Value Ref Range   ALT (SGPT) Piccolo, Waived 32 10 - 47 U/L   AST (SGOT) Piccolo, Waived 27 11 - 38 U/L   Cholesterol Piccolo, Waived 163 <200 mg/dL   HDL Chol Piccolo, Waived 61 >59 mg/dL   Triglycerides Piccolo,Waived 167 (H) <150 mg/dL   Chol/HDL Ratio Piccolo,Waive 2.7 mg/dL   LDL Chol Calc Piccolo Waived 69 <100 mg/dL   VLDL Chol Calc Piccolo,Waive 33 (H) <30 mg/dL      Assessment & Plan:   Problem List Items Addressed This Visit    None    Visit Diagnoses    Viral URI    -  Primary   Xofluza sample given in case  influenza, supportive care with mucinex, humidifier, and delsym prn. F/u if worsening or not improving       Follow up plan: Return if symptoms worsen or fail to improve.

## 2018-03-16 ENCOUNTER — Telehealth: Payer: Self-pay | Admitting: Family Medicine

## 2018-03-16 MED ORDER — AZITHROMYCIN 250 MG PO TABS: ORAL_TABLET | ORAL | 0 refills | Status: DC

## 2018-03-16 NOTE — Telephone Encounter (Signed)
Patient notified via voicemail.

## 2018-03-16 NOTE — Telephone Encounter (Signed)
Zpack sent, needs to follow up in clinic if not much better on medication

## 2018-03-16 NOTE — Telephone Encounter (Signed)
Copied from [place] [id number]. Topic: Quick Communication - See Telephone Encounter >> Mar 16, 2018 11:29 AM Berneta Levins wrote: CRM for notification. See Telephone encounter for: 03/16/18.  Pt calling states that she is not feeling any better and wants to know what other medication can be called in for her. Pt uses [facility], [place], [place] [phone] (Phone) [phone] (Fax) Pt can be reached at [phone]

## 2018-03-18 NOTE — Patient Instructions (Signed)
Follow up as needed

## 2018-03-23 ENCOUNTER — Other Ambulatory Visit: Payer: Self-pay | Admitting: Family Medicine

## 2018-03-23 NOTE — Telephone Encounter (Signed)
Requested medication (s) are due for refill today: yes  Requested medication (s) are on the active medication list: yes  Last refill:  02/13/18  Future visit scheduled: yes  Notes to clinic:  Controlled substance   Requested Prescriptions  Pending Prescriptions Disp Refills   clonazePAM (KLONOPIN) 0.5 MG tablet [Pharmacy Med Name: CLONAZEPAM 0.5 MG TABLET] 20 tablet     Sig: TAKE ONE TABLET BY MOUTH TWICE DAILY AS NEEDED FOR ANXIETY     Not Delegated - Psychiatry:  Anxiolytics/Hypnotics Failed - 03/23/2018  9:08 AM      Failed - This refill cannot be delegated      Failed - Urine Drug Screen completed in last 360 days.      Passed - Valid encounter within last 6 months    Recent Outpatient Visits          1 week ago Viral URI   [facility] Roof [place], [place]   3 months ago Essential hypertension   [place] Crissman, Jeannette How, MD   4 months ago [place], Jeannette How, MD   5 months ago Ganglion cyst   [facility] Roof [place], [place]   7 months ago Breast cancer screening   [place], Jeannette How, MD      Future Appointments            In 3 weeks Crissman, Jeannette How, MD [facility]   In 1 month  [facility], [place]

## 2018-03-26 ENCOUNTER — Other Ambulatory Visit: Payer: Self-pay | Admitting: Family Medicine

## 2018-03-26 MED ORDER — CLONAZEPAM 0.5 MG PO TABS: 0.5000 mg | ORAL_TABLET | Freq: Two times a day (BID) | ORAL | 5 refills | Status: DC | PRN

## 2018-03-30 ENCOUNTER — Encounter: Payer: Self-pay | Admitting: Family Medicine

## 2018-03-30 ENCOUNTER — Ambulatory Visit (INDEPENDENT_AMBULATORY_CARE_PROVIDER_SITE_OTHER): Payer: Medicare Other | Admitting: Family Medicine

## 2018-03-30 VITALS — BP 156/100 | HR 66 | Temp 98.4°F | Wt 121.8 lb

## 2018-03-30 DIAGNOSIS — Z23 Encounter for immunization: Secondary | ICD-10-CM | POA: Diagnosis not present

## 2018-03-30 DIAGNOSIS — M6740 Ganglion, unspecified site: Secondary | ICD-10-CM | POA: Diagnosis not present

## 2018-03-30 NOTE — Progress Notes (Signed)
BP (!) 156/100   Pulse 66   Temp 98.4 F (36.9 C) (Oral)   Wt 121 lb 12.8 oz (55.2 kg)   SpO2 96%   BMI 19.66 kg/m    Subjective:    Patient ID: Caitlyn May, female    DOB: Oct 15, 1955, 62 y.o.   MRN: [id number]  HPI: Caitlyn May is a 62 y.o. female  Chief Complaint  Patient presents with  . Cyst   Here today for aspiration of ganglion cyst of left wrist. Has been present for about a year now and growing larger. Sometimes painful. Has tried some home remedies and even tried to drain it at home with no relief. Denies redness, heat, spontaneous drainage, decreased ROM at wrist. Pt aware from our last conversation that there is a high likelihood of recurrence after aspiration and that full excision is recommended - wanting to proceed with aspiration first and will consider excision if needed.  Relevant past medical, surgical, family and social history reviewed and updated as indicated. Interim medical history since our last visit reviewed. Allergies and medications reviewed and updated.  Review of Systems  Per HPI unless specifically indicated above     Objective:    BP (!) 156/100   Pulse 66   Temp 98.4 F (36.9 C) (Oral)   Wt 121 lb 12.8 oz (55.2 kg)   SpO2 96%   BMI 19.66 kg/m   Wt Readings from Last 3 Encounters:  03/30/18 121 lb 12.8 oz (55.2 kg)  03/14/18 118 lb (53.5 kg)  12/11/17 126 lb 9.6 oz (57.4 kg)    Physical Exam  Constitutional: She is oriented to person, place, and time. She appears well-developed and well-nourished. No distress.  HENT:  Head: Atraumatic.  Eyes: Conjunctivae and EOM are normal.  Neck: Normal range of motion. Neck supple.  Cardiovascular: Normal rate, regular rhythm and normal heart sounds.  Pulmonary/Chest: Effort normal and breath sounds normal.  Musculoskeletal: Normal range of motion.  Neurological: She is alert and oriented to person, place, and time.  Skin: Skin is warm and dry.  1.5 cm fluctuant ganglion cyst  present posterior left wrist  Psychiatric: She has a normal mood and affect. Her behavior is normal.  Nursing note and vitals reviewed.  Procedure Note: Aspiration of ganglion cyst left wrist Procedure explained at length to patient, questions adequately answered. Area cleaned with alcohol and infiltrated with 1 cc 1% lidocaine with epinephrine. Betadine used to sterilize area, then 18 gauge needle with syringe used to aspirate contents of ganglion cyst. 0.25 cc aspirated. Antibiotic ointment applied and area covered with bandage. Patient tolerated procedure well with no immediate complications noted.   Results for orders placed or performed in visit on 05/31/70  Basic metabolic panel  Result Value Ref Range   Glucose 93 65 - 99 mg/dL   BUN 9 8 - 27 mg/dL   Creatinine, Ser 0.95 0.57 - 1.00 mg/dL   GFR calc non Af Amer 64 >59 mL/min/1.73   GFR calc Af Amer 74 >59 mL/min/1.73   BUN/Creatinine Ratio 9 (L) 12 - 28   Sodium 143 134 - 144 mmol/L   Potassium 4.2 3.5 - 5.2 mmol/L   Chloride 106 96 - 106 mmol/L   CO2 24 20 - 29 mmol/L   Calcium 8.6 (L) 8.7 - 10.3 mg/dL  LP+ALT+AST Piccolo, Waived  Result Value Ref Range   ALT (SGPT) Piccolo, Waived 32 10 - 47 U/L   AST (SGOT) Piccolo, Waived 27 11 - 38  U/L   Cholesterol Piccolo, Waived 163 <200 mg/dL   HDL Chol Piccolo, Waived 61 >59 mg/dL   Triglycerides Piccolo,Waived 167 (H) <150 mg/dL   Chol/HDL Ratio Piccolo,Waive 2.7 mg/dL   LDL Chol Calc Piccolo Waived 69 <100 mg/dL   VLDL Chol Calc Piccolo,Waive 33 (H) <30 mg/dL      Assessment & Plan:   Problem List Items Addressed This Visit    None    Visit Diagnoses    Ganglion cyst    -  Primary   Cyst aspirated today, pt will call if recurring and referral will be placed if so for full excision. Wound care reviewed   Need for influenza vaccination       Relevant Orders   Flu Vaccine QUAD 36+ mos IM (Completed)       Follow up plan: Return if symptoms worsen or fail to  improve.

## 2018-04-02 NOTE — Patient Instructions (Signed)
Follow up as needed

## 2018-04-19 ENCOUNTER — Ambulatory Visit (INDEPENDENT_AMBULATORY_CARE_PROVIDER_SITE_OTHER): Payer: Medicare Other | Admitting: Family Medicine

## 2018-04-19 ENCOUNTER — Encounter: Payer: Self-pay | Admitting: Family Medicine

## 2018-04-19 VITALS — BP 128/81 | HR 98 | Temp 97.6°F | Ht 64.49 in | Wt 122.0 lb

## 2018-04-19 DIAGNOSIS — M5441 Lumbago with sciatica, right side: Secondary | ICD-10-CM | POA: Diagnosis not present

## 2018-04-19 DIAGNOSIS — I10 Essential (primary) hypertension: Secondary | ICD-10-CM

## 2018-04-19 DIAGNOSIS — M5442 Lumbago with sciatica, left side: Secondary | ICD-10-CM | POA: Diagnosis not present

## 2018-04-19 DIAGNOSIS — N183 Chronic kidney disease, stage 3 unspecified: Secondary | ICD-10-CM

## 2018-04-19 DIAGNOSIS — F3342 Major depressive disorder, recurrent, in full remission: Secondary | ICD-10-CM | POA: Diagnosis not present

## 2018-04-19 DIAGNOSIS — Z7189 Other specified counseling: Secondary | ICD-10-CM | POA: Diagnosis not present

## 2018-04-19 DIAGNOSIS — E782 Mixed hyperlipidemia: Secondary | ICD-10-CM | POA: Diagnosis not present

## 2018-04-19 DIAGNOSIS — R7989 Other specified abnormal findings of blood chemistry: Secondary | ICD-10-CM

## 2018-04-19 DIAGNOSIS — Z1211 Encounter for screening for malignant neoplasm of colon: Secondary | ICD-10-CM

## 2018-04-19 DIAGNOSIS — G8929 Other chronic pain: Secondary | ICD-10-CM

## 2018-04-19 DIAGNOSIS — R079 Chest pain, unspecified: Secondary | ICD-10-CM | POA: Diagnosis not present

## 2018-04-19 DIAGNOSIS — K219 Gastro-esophageal reflux disease without esophagitis: Secondary | ICD-10-CM

## 2018-04-19 DIAGNOSIS — F419 Anxiety disorder, unspecified: Secondary | ICD-10-CM | POA: Diagnosis not present

## 2018-04-19 LAB — URINALYSIS, ROUTINE W REFLEX MICROSCOPIC
Bilirubin, UA: NEGATIVE
Glucose, UA: NEGATIVE
Ketones, UA: NEGATIVE
LEUKOCYTES UA: NEGATIVE
NITRITE UA: NEGATIVE
PH UA: 5.5 (ref 5.0–7.5)
Protein, UA: NEGATIVE
RBC, UA: NEGATIVE
Specific Gravity, UA: 1.01 (ref 1.005–1.030)
Urobilinogen, Ur: 0.2 mg/dL (ref 0.2–1.0)

## 2018-04-19 MED ORDER — METOPROLOL SUCCINATE ER 50 MG PO TB24: ORAL_TABLET | ORAL | 4 refills | Status: DC

## 2018-04-19 MED ORDER — BENAZEPRIL HCL 40 MG PO TABS: 40.0000 mg | ORAL_TABLET | Freq: Every day | ORAL | 4 refills | Status: DC

## 2018-04-19 MED ORDER — MELOXICAM 7.5 MG PO TABS: 7.5000 mg | ORAL_TABLET | Freq: Every day | ORAL | 5 refills | Status: DC

## 2018-04-19 MED ORDER — CLONAZEPAM 0.5 MG PO TABS: 0.5000 mg | ORAL_TABLET | Freq: Two times a day (BID) | ORAL | 5 refills | Status: DC | PRN

## 2018-04-19 MED ORDER — ATORVASTATIN CALCIUM 40 MG PO TABS: 40.0000 mg | ORAL_TABLET | Freq: Every day | ORAL | 4 refills | Status: DC

## 2018-04-19 NOTE — Assessment & Plan Note (Signed)
The current medical regimen is effective;  continue present plan and medications.  

## 2018-04-19 NOTE — Assessment & Plan Note (Signed)
Chronic anxiety stable with clonazepam approximately 40 to 45/month.  We will continue current dosing and prescription.

## 2018-04-19 NOTE — Assessment & Plan Note (Signed)
Continues with right hemiparesis and some right sided weakness tingling but stable.  Walks with a cane which helps to prevent falling.  This is also compounded by her chronic back and neck issues with degenerative disc disease.

## 2018-04-19 NOTE — Assessment & Plan Note (Signed)
A voluntary discussion about advanced care planning including explanation and discussion of advanced directives was extentively discussed with the patient.  Explained about the healthcare proxy and living will was reviewed and packet with forms with expiration of how to fill them out was given.  Time spent: Encounter 16+ min individuals present: Patient 

## 2018-04-19 NOTE — Progress Notes (Addendum)
BP 128/81 (BP Location: Left Arm, Patient Position: Sitting, Cuff Size: Normal)   Pulse 98   Temp 97.6 F (36.4 C)   Ht 5' 4.49" (1.638 m)   Wt 122 lb (55.3 kg)   SpO2 98%   BMI 20.63 kg/m    Subjective:    Patient ID: Caitlyn May, female    DOB: December 15, 1955, 62 y.o.   MRN: [id number]  HPI: Caitlyn May is a 62 y.o. female  Chief Complaint  Patient presents with  . Chest Pain    patient states that when she lays down she she has a pulling sensation in her chest   Patient with chest pain worse when lying down on the left side it starts hurting in her sternum area.  This is also aggravated by activity with substernal chest tightness.  Patient is sometimes woken at night from this chest pain symptoms. Symptoms of also worse if goes without a bra is started wearing a bra 24 hours. Patient's blood pressure cholesterol doing well with medications no complaints. Clonazepam usage pretty much takes 1 a day but takes extra tablet not every day.  50 tablets last more than a month. Depression other nerves doing well with sertraline.  Relevant past medical, surgical, family and social history reviewed and updated as indicated. Interim medical history since our last visit reviewed. Allergies and medications reviewed and updated.  Review of Systems  Constitutional: Negative.   HENT: Negative.   Eyes: Negative.   Respiratory: Negative.   Cardiovascular: Negative.   Gastrointestinal: Negative.   Endocrine: Negative.   Genitourinary: Negative.   Musculoskeletal: Negative.   Skin: Negative.   Allergic/Immunologic: Negative.   Neurological: Negative.   Hematological: Negative.   Psychiatric/Behavioral: Negative.      Per HPI unless specifically indicated above     Objective:    BP 128/81 (BP Location: Left Arm, Patient Position: Sitting, Cuff Size: Normal)   Pulse 98   Temp 97.6 F (36.4 C)   Ht 5' 4.49" (1.638 m)   Wt 122 lb (55.3 kg)   SpO2 98%   BMI 20.63 kg/m     Wt Readings from Last 3 Encounters:  04/19/18 122 lb (55.3 kg)  03/30/18 121 lb 12.8 oz (55.2 kg)  03/14/18 118 lb (53.5 kg)    Physical Exam  Constitutional: She is oriented to person, place, and time. She appears well-developed and well-nourished.  HENT:  Head: Normocephalic and atraumatic.  Right Ear: External ear normal.  Left Ear: External ear normal.  Nose: Nose normal.  Mouth/Throat: Oropharynx is clear and moist.  Eyes: Pupils are equal, round, and reactive to light. Conjunctivae and EOM are normal.  Neck: Normal range of motion. Neck supple. Carotid bruit is not present.  Cardiovascular: Normal rate, regular rhythm and normal heart sounds.  No murmur heard. Pulmonary/Chest: Effort normal and breath sounds normal. She exhibits bony tenderness. She exhibits no mass. Right breast exhibits no mass, no skin change and no tenderness. Left breast exhibits no mass, no skin change and no tenderness. Breasts are symmetrical.  Abdominal: Soft. Bowel sounds are normal. There is no hepatosplenomegaly.  Musculoskeletal: Normal range of motion.  Neurological: She is alert and oriented to person, place, and time.  Skin: No rash noted.  Psychiatric: She has a normal mood and affect. Her behavior is normal. Judgment and thought content normal.    Results for orders placed or performed in visit on 04/19/18  Urinalysis, Routine w reflex microscopic  Result Value Ref Range  Specific Gravity, UA 1.010 1.005 - 1.030   pH, UA 5.5 5.0 - 7.5   Color, UA Orange Yellow   Appearance Ur Cloudy (A) Clear   Leukocytes, UA Negative Negative   Protein, UA Negative Negative/Trace   Glucose, UA Negative Negative   Ketones, UA Negative Negative   RBC, UA Negative Negative   Bilirubin, UA Negative Negative   Urobilinogen, Ur 0.2 0.2 - 1.0 mg/dL   Nitrite, UA Negative Negative      Assessment & Plan:   Problem List Items Addressed This Visit      Cardiovascular and Mediastinum   Hypertension     The current medical regimen is effective;  continue present plan and medications.       Relevant Medications   metoprolol succinate (TOPROL-XL) 50 MG 24 hr tablet   benazepril (LOTENSIN) 40 MG tablet   atorvastatin (LIPITOR) 40 MG tablet   Other Relevant Orders   CBC with Differential/Platelet   Comprehensive metabolic panel   TSH     Digestive   Acid reflux    The current medical regimen is effective;  continue present plan and medications.         Nervous and Auditory   Chronic low back pain (Location of Primary Source of Pain) (Bilateral) (R>L) (Chronic)    Continues with right hemiparesis and some right sided weakness tingling but stable.  Walks with a cane which helps to prevent falling.  This is also compounded by her chronic back and neck issues with degenerative disc disease.      Relevant Medications   clonazePAM (KLONOPIN) 0.5 MG tablet   meloxicam (MOBIC) 7.5 MG tablet     Other   Depression    The current medical regimen is effective;  continue present plan and medications.       Hyperlipidemia    The current medical regimen is effective;  continue present plan and medications.       Relevant Medications   metoprolol succinate (TOPROL-XL) 50 MG 24 hr tablet   benazepril (LOTENSIN) 40 MG tablet   atorvastatin (LIPITOR) 40 MG tablet   Other Relevant Orders   CBC with Differential/Platelet   Lipid Panel w/o Chol/HDL Ratio   TSH   Elevated TSH   Relevant Orders   TSH   Advanced care planning/counseling discussion    A voluntary discussion about advanced care planning including explanation and discussion of advanced directives was extentively discussed with the patient.  Explained about the healthcare proxy and living will was reviewed and packet with forms with expiration of how to fill them out was given.  Time spent: Encounter 16+ min individuals present: Patient      Chest pain   Relevant Orders   TSH   EKG 12-Lead   Anxiety    Chronic anxiety  stable with clonazepam approximately 40 to 45/month.  We will continue current dosing and prescription.       Other Visit Diagnoses    Screening for colon cancer    -  Primary   Relevant Orders   Ambulatory referral to Gastroenterology   CKD (chronic kidney disease) stage 3, GFR 30-59 ml/min (HCC)       Relevant Orders   CBC with Differential/Platelet   TSH   Urinalysis, Routine w reflex microscopic (Completed)   Abnormal TSH       Relevant Orders   TSH      Problem List Items Addressed This Visit      Cardiovascular and Mediastinum  Hypertension - Primary   Relevant Orders   CBC with Differential/Platelet   Urinalysis, Routine w reflex microscopic     Endocrine   Hypothyroidism   Relevant Orders   TSH     Other   Hypercholesterolemia   Relevant Orders   Lipid Panel w/o Chol/HDL Ratio   Elevated liver enzymes   Relevant Orders   Comprehensive metabolic panel    Other Visit Diagnoses    Type 2 diabetes, controlled, with neuropathy ([place])       Relevant Orders   Bayer DCA Hb A1c Waived   Urinalysis, Routine w reflex microscopic   Chest pain, unspecified type       Relevant Orders   EKG 12-Lead (Completed)    Patient's EKG is essentially normal with review chest pain associated and duplicated with palpation of her sternum.  Diagnosis more costochondritis will give meloxicam to see if that helps also may help some of her other pain. EKG evaluation done because of potential life-threatening nature with threat to life or bodily function making this high risk evaluation. B12 shot given at patient's request.  We will see if that helps any of her symptoms.  Follow up plan: Return in about 6 months (around 10/18/2018) for BMP,  Lipids, ALT, AST.

## 2018-04-19 NOTE — Addendum Note (Signed)
Addended by: Golden Pop A on: 04/19/2018 04:47 PM   Modules accepted: Orders, Level of Service

## 2018-04-20 LAB — COMPREHENSIVE METABOLIC PANEL
A/G RATIO: 1.8 (ref 1.2–2.2)
ALT: 19 IU/L (ref 0–32)
AST: 20 IU/L (ref 0–40)
Albumin: 4.5 g/dL (ref 3.6–4.8)
Alkaline Phosphatase: 85 IU/L (ref 39–117)
BILIRUBIN TOTAL: 0.3 mg/dL (ref 0.0–1.2)
BUN/Creatinine Ratio: 9 — ABNORMAL LOW (ref 12–28)
BUN: 10 mg/dL (ref 8–27)
CALCIUM: 9.2 mg/dL (ref 8.7–10.3)
CO2: 22 mmol/L (ref 20–29)
Chloride: 104 mmol/L (ref 96–106)
Creatinine, Ser: 1.16 mg/dL — ABNORMAL HIGH (ref 0.57–1.00)
GFR, EST AFRICAN AMERICAN: 58 mL/min/{1.73_m2} — AB (ref >59)
GFR, EST NON AFRICAN AMERICAN: 50 mL/min/{1.73_m2} — AB (ref >59)
GLUCOSE: 86 mg/dL (ref 65–99)
Globulin, Total: 2.5 g/dL (ref 1.5–4.5)
Potassium: 4.7 mmol/L (ref 3.5–5.2)
Sodium: 142 mmol/L (ref 134–144)
Total Protein: 7 g/dL (ref 6.0–8.5)

## 2018-04-20 LAB — CBC WITH DIFFERENTIAL/PLATELET
BASOS: 1 %
Basophils Absolute: 0.1 10*3/uL (ref 0.0–0.2)
EOS (ABSOLUTE): 0.1 10*3/uL (ref 0.0–0.4)
EOS: 1 %
Hematocrit: 43.7 % (ref 34.0–46.6)
Hemoglobin: 14.9 g/dL (ref 11.1–15.9)
IMMATURE GRANS (ABS): 0 10*3/uL (ref 0.0–0.1)
IMMATURE GRANULOCYTES: 1 %
LYMPHS: 32 %
Lymphocytes Absolute: 2.1 10*3/uL (ref 0.7–3.1)
MCH: 33.2 pg — AB (ref 26.6–33.0)
MCHC: 34.1 g/dL (ref 31.5–35.7)
MCV: 97 fL (ref 79–97)
Monocytes Absolute: 0.6 10*3/uL (ref 0.1–0.9)
Monocytes: 9 %
NEUTROS ABS: 3.7 10*3/uL (ref 1.4–7.0)
NEUTROS PCT: 56 %
PLATELETS: 290 10*3/uL (ref 150–450)
RBC: 4.49 x10E6/uL (ref 3.77–5.28)
RDW: 11.6 % — ABNORMAL LOW (ref 12.3–15.4)
WBC: 6.6 10*3/uL (ref 3.4–10.8)

## 2018-04-20 LAB — LIPID PANEL W/O CHOL/HDL RATIO
Cholesterol, Total: 150 mg/dL (ref 100–199)
HDL: 58 mg/dL (ref >39)
LDL Calculated: 69 mg/dL (ref 0–99)
Triglycerides: 116 mg/dL (ref 0–149)
VLDL CHOLESTEROL CAL: 23 mg/dL (ref 5–40)

## 2018-04-20 LAB — TSH: TSH: 2.4 u[IU]/mL (ref 0.450–4.500)

## 2018-04-25 ENCOUNTER — Telehealth: Payer: Self-pay | Admitting: Family Medicine

## 2018-04-25 NOTE — Telephone Encounter (Signed)
Phone call Discussed with patient slight decline in renal function patient taking a lot of Advil Aleve and other nonsteroidals for aches and pains discussed that that is not liked by her kidneys will stop meloxicam and these medications use Tylenol and will check renal function next office visit.

## 2018-04-27 ENCOUNTER — Ambulatory Visit: Payer: Self-pay

## 2018-04-27 NOTE — Telephone Encounter (Signed)
  Pt c/o forehead pain and headache to the right side of the head. Pt stated that her right eye is very swollen. Pt describe the pain as mild. Pt is having blood tinged and yellow green nasal discharge. Pt denies fever, sore throat, cough, earache, or difficulty breathing. Denies fever.  Due to eye swelling, advised pt to go to [facility]. Care advice given and pt verbalized understanding.  Reason for Disposition . [1] Redness or swelling on the cheek, forehead or around the eye AND [2] no fever  Answer Assessment - Initial Assessment Questions 1. LOCATION: "Where does it hurt?"      Forehead pain and headache right side 2. ONSET: "When did the sinus pain start?"  (e.g., hours, days)      yesterday 3. SEVERITY: "How bad is the pain?"   (Scale 1-10; mild, moderate or severe)   - MILD (1-3): doesn't interfere with normal activities    - MODERATE (4-7): interferes with normal activities (e.g., work or school) or awakens from sleep   - SEVERE (8-10): excruciating pain and patient unable to do any normal activities        mild 4. RECURRENT SYMPTOM: "Have you ever had sinus problems before?" If so, ask: "When was the last time?" and "What happened that time?"      Yes- earlier this year 5. NASAL CONGESTION: "Is the nose blocked?" If so, ask, "Can you open it or must you breathe through the mouth?"     no 6. NASAL DISCHARGE: "Do you have discharge from your nose?" If so ask, "What color?"     Yes- blood tinged, yellow green 7. FEVER: "Do you have a fever?" If so, ask: "What is it, how was it measured, and when did it start?"      no 8. OTHER SYMPTOMS: "Do you have any other symptoms?" (e.g., sore throat, cough, earache, difficulty breathing)     No, right eye very swollen 9. PREGNANCY: "Is there any chance you are pregnant?" "When was your last menstrual period?"     n/a  Protocols used: SINUS PAIN OR CONGESTION-A-AH

## 2018-04-30 ENCOUNTER — Ambulatory Visit: Payer: Medicare Other | Admitting: Family Medicine

## 2018-05-03 ENCOUNTER — Other Ambulatory Visit: Payer: Self-pay

## 2018-05-03 ENCOUNTER — Ambulatory Visit (INDEPENDENT_AMBULATORY_CARE_PROVIDER_SITE_OTHER): Payer: Medicare Other | Admitting: Family Medicine

## 2018-05-03 ENCOUNTER — Ambulatory Visit (INDEPENDENT_AMBULATORY_CARE_PROVIDER_SITE_OTHER): Payer: Medicare Other

## 2018-05-03 ENCOUNTER — Encounter: Payer: Self-pay | Admitting: Family Medicine

## 2018-05-03 VITALS — BP 132/82 | HR 94 | Temp 98.0°F | Ht 64.0 in | Wt 122.0 lb

## 2018-05-03 VITALS — BP 111/81 | HR 71 | Temp 97.7°F | Ht 64.4 in | Wt 121.0 lb

## 2018-05-03 DIAGNOSIS — Z113 Encounter for screening for infections with a predominantly sexual mode of transmission: Secondary | ICD-10-CM | POA: Diagnosis not present

## 2018-05-03 DIAGNOSIS — Z1211 Encounter for screening for malignant neoplasm of colon: Secondary | ICD-10-CM | POA: Diagnosis not present

## 2018-05-03 DIAGNOSIS — Z7251 High risk heterosexual behavior: Secondary | ICD-10-CM

## 2018-05-03 DIAGNOSIS — N898 Other specified noninflammatory disorders of vagina: Secondary | ICD-10-CM | POA: Diagnosis not present

## 2018-05-03 DIAGNOSIS — Z135 Encounter for screening for eye and ear disorders: Secondary | ICD-10-CM

## 2018-05-03 DIAGNOSIS — Z1239 Encounter for other screening for malignant neoplasm of breast: Secondary | ICD-10-CM | POA: Diagnosis not present

## 2018-05-03 DIAGNOSIS — B9689 Other specified bacterial agents as the cause of diseases classified elsewhere: Secondary | ICD-10-CM

## 2018-05-03 DIAGNOSIS — Z0000 Encounter for general adult medical examination without abnormal findings: Secondary | ICD-10-CM | POA: Diagnosis not present

## 2018-05-03 DIAGNOSIS — N760 Acute vaginitis: Secondary | ICD-10-CM | POA: Diagnosis not present

## 2018-05-03 LAB — WET PREP FOR TRICH, YEAST, CLUE
Clue Cell Exam: POSITIVE — AB
Trichomonas Exam: NEGATIVE
Yeast Exam: NEGATIVE

## 2018-05-03 MED ORDER — TETANUS-DIPHTH-ACELL PERTUSSIS 5-2.5-18.5 LF-MCG/0.5 IM SUSP: 0.5000 mL | Freq: Once | INTRAMUSCULAR | 0 refills | Status: AC

## 2018-05-03 MED ORDER — ZOSTER VAC RECOMB ADJUVANTED 50 MCG/0.5ML IM SUSR: 0.5000 mL | Freq: Once | INTRAMUSCULAR | 1 refills | Status: AC

## 2018-05-03 NOTE — Progress Notes (Signed)
Subjective:   Caitlyn May is a 62 y.o. female who presents for Medicare Annual (Subsequent) preventive examination.     Objective:     Vitals: BP 132/82 (BP Location: Right Arm, Patient Position: Sitting)   Pulse 94   Temp 98 F (36.7 C) (Oral)   Ht 5\' 4"  (1.626 m)   Wt 122 lb (55.3 kg)   SpO2 96%   BMI 20.94 kg/m   Body mass index is 20.94 kg/m.  Advanced Directives 05/03/2018 04/14/2017 11/29/2016  Does Patient Have a Medical Advance Directive? No No No  Would patient like information on creating a medical advance directive? Yes (MAU/Ambulatory/Procedural Areas - Information given) Yes (MAU/Ambulatory/Procedural Areas - Information given) -    Tobacco Social History   Tobacco Use  Smoking Status Current Every Day Smoker  . Packs/day: 2.00  . Types: Cigarettes  Smokeless Tobacco Never Used     Ready to quit: Not Answered Counseling given: Not Answered   Clinical Intake:  Pre-visit preparation completed: No  Pain : 0-10 Pain Score: 5  Pain Type: Chronic pain Pain Location: Back Pain Orientation: Lower Pain Descriptors / Indicators: Aching Pain Onset: More than a month ago Pain Frequency: Intermittent     Diabetes: No  How often do you need to have someone help you when you read instructions, pamphlets, or other written materials from your doctor or pharmacy?: 1 - Never What is the last grade level you completed in school?: 9th grade  Interpreter Needed?: No  Information entered by :: Caitlyn Dense, RN  Past Medical History:  Diagnosis Date  . Chronic kidney disease   . Depression   . Failed back syndrome, lumbar   . Hyperlipidemia   . Hypertension   . Osteoporosis   . Weight loss    Past Surgical History:  Procedure Laterality Date  . ABDOMINAL HYSTERECTOMY    . CESAREAN SECTION    . MOUTH SURGERY    . SPINE SURGERY     Family History  Problem Relation Age of Onset  . Hypertension Mother   . Dementia Mother   . Cancer Father   .  Cancer Maternal Aunt        breast  . Diabetes Neg Hx   . Heart disease Neg Hx   . Stroke Neg Hx   . COPD Neg Hx    Social History   Socioeconomic History  . Marital status: Married    Spouse name: Not on file  . Number of children: Not on file  . Years of education: 63  . Highest education level: 10th grade  Occupational History  . Not on file  Social Needs  . Financial resource strain: Not very hard  . Food insecurity:    Worry: Never true    Inability: Sometimes true  . Transportation needs:    Medical: No    Non-medical: No  Tobacco Use  . Smoking status: Current Every Day Smoker    Packs/day: 2.00    Types: Cigarettes  . Smokeless tobacco: Never Used  Substance and Sexual Activity  . Alcohol use: No  . Drug use: No  . Sexual activity: Not on file  Lifestyle  . Physical activity:    Days per week: 0 days    Minutes per session: 0 min  . Stress: Rather much  Relationships  . Social connections:    Talks on phone: Twice a week    Gets together: Once a week    Attends religious service: Never  Active member of club or organization: No    Attends meetings of clubs or organizations: Never    Relationship status: Living with partner  Other Topics Concern  . Not on file  Social History Narrative  . Not on file    Outpatient Encounter Medications as of 05/03/2018  Medication Sig  . albuterol (PROVENTIL HFA;VENTOLIN HFA) 108 (90 Base) MCG/ACT inhaler Inhale 2 puffs into the lungs every 6 (six) hours as needed for wheezing or shortness of breath.  Marland Kitchen atorvastatin (LIPITOR) 40 MG tablet Take 1 tablet (40 mg total) by mouth daily.  . benazepril (LOTENSIN) 40 MG tablet Take 1 tablet (40 mg total) by mouth daily.  . cetirizine (ZYRTEC) 10 MG tablet TAKE ONE TABLET BY MOUTH EVERY DAY  . clonazePAM (KLONOPIN) 0.5 MG tablet Take 1 tablet (0.5 mg total) by mouth 2 (two) times daily as needed. for anxiety  . cyclobenzaprine (FLEXERIL) 5 MG tablet Take 1 tablet (5 mg  total) by mouth daily as needed for muscle spasms.  . metoprolol succinate (TOPROL-XL) 50 MG 24 hr tablet TAKE ONE TABLET BY MOUTH EVERY DAY WITH A MEAL  . ranitidine (ZANTAC) 75 MG tablet Take 1 tablet (75 mg total) by mouth 2 (two) times daily.  . sertraline (ZOLOFT) 100 MG tablet Take 1 tablet (100 mg total) daily by mouth.  . Tdap (BOOSTRIX) 5-2.5-18.5 LF-MCG/0.5 injection Inject 0.5 mLs into the muscle once for 1 dose.  . triamcinolone (NASACORT AQ) 55 MCG/ACT AERO nasal inhaler Place 2 sprays into the nose daily.  Marland Kitchen Zoster Vaccine Adjuvanted [facility]) injection Inject 0.5 mLs into the muscle once for 1 dose.  . [DISCONTINUED] Tdap (BOOSTRIX) 5-2.5-18.5 LF-MCG/0.5 injection Inject 0.5 mLs into the muscle once.  . [DISCONTINUED] Zoster Vaccine Adjuvanted [facility]) injection Inject 0.5 mLs into the muscle once.   No facility-administered encounter medications on file as of 05/03/2018.     Activities of Daily Living In your present state of health, do you have any difficulty performing the following activities: 05/03/2018 04/19/2018  Hearing? N N  Vision? Y Y  Difficulty concentrating or making decisions? Tempie Donning  Walking or climbing stairs? Y Y  Dressing or bathing? N N  Doing errands, shopping? N N  Preparing Food and eating ? N -  Using the Toilet? N -  In the past six months, have you accidently leaked urine? Y -  Do you have problems with loss of bowel control? N -  Managing your Medications? N -  Managing your Finances? N -  Housekeeping or managing your Housekeeping? N -  Some recent data might be hidden    Patient Care Team: Caitlyn Maple, MD as PCP - General (Family Medicine)    Assessment:   This is a routine wellness examination for Caitlyn May.  Exercise Activities and Dietary recommendations Current Exercise Habits: The patient does not participate in regular exercise at present, Exercise limited by: orthopedic condition(s)  Goals    . DIET - INCREASE WATER INTAKE       Recommend drinking at least 5-6 glasses of water a day     . Quit Smoking     Smoking cessation discussed       Fall Risk Fall Risk  05/03/2018 03/14/2018 07/18/2017 04/14/2017 01/11/2017  Falls in the past year? 1 No Yes Yes Yes  Number falls in past yr: 1 - 1 2 or more 2 or more  Injury with Fall? 0 - Yes Yes No  Risk Factor Category  - -  High Fall Risk - -  Follow up Education provided - - - Falls evaluation completed   Is the patient's home free of loose throw rugs in walkways, pet beds, electrical cords, etc?   yes      Grab bars in the bathroom? yes      Handrails on the stairs?   yes      Adequate lighting?   yes  Timed Get Up and Go performed: 12 seconds  Depression Screen PHQ 2/9 Scores 05/03/2018 04/19/2018 07/18/2017 04/14/2017  PHQ - 2 Score 6 1 4 4   PHQ- 9 Score 13 9 15 15      Cognitive Function     6CIT Screen 05/03/2018 04/14/2017  What Year? 0 points 0 points  What month? 0 points 0 points  What time? 0 points 0 points  Count back from 20 0 points 0 points  Months in reverse 2 points 0 points  Repeat phrase 2 points 0 points  Total Score 4 0    Immunization History  Administered Date(s) Administered  . Influenza, High Dose Seasonal PF 04/14/2017  . Influenza,inj,Quad PF,6+ Mos 03/31/2015, 02/10/2016, 03/30/2018  . Influenza-Unspecified 03/11/2014  . Pneumococcal-Unspecified 11/01/2007  . Td 11/01/2007    Qualifies for Shingles Vaccine? Yes, educated and ordered to pharmacy  Screening Tests Health Maintenance  Topic Date Due  . MAMMOGRAM  09/20/2004  . COLONOSCOPY  09/20/2004  . TETANUS/TDAP  10/31/2017  . INFLUENZA VACCINE  Completed  . HIV Screening  Completed  . Hepatitis C Screening  Addressed    Cancer Screenings: Lung: Low Dose CT Chest recommended if Age 106-80 years, 30 pack-year currently smoking OR have quit w/in 15years. Patient does not qualify. Breast:  Up to date on Mammogram? No, ordered in March Up to date of Bone  Density/Dexa? Yes Colorectal: up to date  Additional Screenings:  Hepatitis C Screening: declined TDAP due: ordered to pharmacy    Plan:    I have personally reviewed and addressed the Medicare Annual Wellness questionnaire and have noted the following in the patient's chart:  A. Medical and social history B. Use of alcohol, tobacco or illicit drugs  C. Current medications and supplements D. Functional ability and status E.  Nutritional status F.  Physical activity G. Advance directives H. List of other physicians I.  Hospitalizations, surgeries, and ER visits in previous 12 months J.  [place] to include hearing, vision, cognitive, depression L. Referrals and appointments - none  In addition, I have reviewed and discussed with patient certain preventive protocols, quality metrics, and best practice recommendations. A written personalized care plan for preventive services as well as general preventive health recommendations were provided to patient.  See attached scanned questionnaire for additional information.   Signed,   Caitlyn Dense, RN Nurse Health Advisor  Patient Concerns: None

## 2018-05-03 NOTE — Progress Notes (Signed)
BP 111/81   Pulse 71   Temp 97.7 F (36.5 C) (Oral)   Ht 5' 4.4" (1.636 m)   Wt 121 lb (54.9 kg)   SpO2 96%   BMI 20.51 kg/m    Subjective:    Patient ID: Caitlyn May, female    DOB: 05-26-1956, 62 y.o.   MRN: [id number]  HPI: Caitlyn May is a 62 y.o. female  Chief Complaint  Patient presents with  . Exposure to STD   Here today requesting STI testing as she recently found out her sexual partner was with someone else. No known STIs, but wanting peace of mind. White discharge but minimal itching, no burning or urinary sxs, rashes, lesions, fevers. Not trying anything OTC for discharge.   Relevant past medical, surgical, family and social history reviewed and updated as indicated. Interim medical history since our last visit reviewed. Allergies and medications reviewed and updated.  Review of Systems  Per HPI unless specifically indicated above     Objective:    BP 111/81   Pulse 71   Temp 97.7 F (36.5 C) (Oral)   Ht 5' 4.4" (1.636 m)   Wt 121 lb (54.9 kg)   SpO2 96%   BMI 20.51 kg/m   Wt Readings from Last 3 Encounters:  05/03/18 121 lb (54.9 kg)  05/03/18 122 lb (55.3 kg)  04/19/18 122 lb (55.3 kg)    Physical Exam  Constitutional: She is oriented to person, place, and time. She appears well-developed and well-nourished. No distress.  HENT:  Head: Atraumatic.  Eyes: Conjunctivae and EOM are normal.  Neck: Normal range of motion. Neck supple.  Cardiovascular: Normal rate, regular rhythm and normal heart sounds.  Pulmonary/Chest: Effort normal and breath sounds normal.  Abdominal: Soft. Bowel sounds are normal. There is no tenderness.  Genitourinary: Vaginal discharge found.  Musculoskeletal: Normal range of motion.  Neurological: She is alert and oriented to person, place, and time.  Skin: Skin is warm and dry.  Psychiatric: She has a normal mood and affect. Her behavior is normal.  Nursing note and vitals reviewed.   Results for orders  placed or performed in visit on 05/03/18  GC/Chlamydia Probe Amp  Result Value Ref Range   Chlamydia trachomatis, NAA Negative Negative   Neisseria gonorrhoeae by PCR Negative Negative  WET PREP FOR TRICH, YEAST, CLUE  Result Value Ref Range   Trichomonas Exam Negative Negative   Yeast Exam Negative Negative   Clue Cell Exam Positive (A) Negative  RPR  Result Value Ref Range   RPR Ser Ql Non Reactive Non Reactive  HSV(herpes simplex vrs) 1+2 ab-IgG  Result Value Ref Range   HSV 1 Glycoprotein G Ab, IgG 25.10 (H) 0.00 - 0.90 index   HSV 2 IgG, Type Spec 5.82 (H) 0.00 - 0.90 index      Assessment & Plan:   Problem List Items Addressed This Visit    None    Visit Diagnoses    BV (bacterial vaginosis)    -  Primary   Relevant Medications   metroNIDAZOLE (FLAGYL) 500 MG tablet   Other Relevant Orders   WET PREP FOR [place], YEAST, CLUE (Completed)   Screening for STD (sexually transmitted disease)       Relevant Orders   RPR (Completed)   GC/Chlamydia Probe Amp (Completed)   HSV(herpes simplex vrs) 1+2 ab-IgG (Completed)   High risk heterosexual behavior        Will run panel for STIs, wet prep showing BV  which we will treat with flagyl. Await other results and tx if needed.   Follow up plan: Return for as scheduled.

## 2018-05-03 NOTE — Patient Instructions (Signed)
Caitlyn May , Thank you for taking time to come for your Medicare Wellness Visit. I appreciate your ongoing commitment to your health goals. Please review the following plan we discussed and let me know if I can assist you in the future.   Screening recommendations/referrals: Colonoscopy up to date due, ordered Mammogram due, ordered Bone Density up to date Recommended yearly ophthalmology/optometry visit for glaucoma screening and checkup Recommended yearly dental visit for hygiene and checkup  Vaccinations: Influenza vaccine up to date Pneumococcal vaccine up to date, due at age 76 Tdap vaccine due, ordered to pharmacy Shingles vaccine due, ordered to pharmacy    Advanced directives: Advance directive discussed with you today. I have provided a copy for you to complete at home and have notarized. Once this is complete please bring a copy in to our office so we can scan it into your chart.  Conditions/risks identified: none  Next appointment: Medicare Wellness Visit 05/06/2019 @ 1pm  Preventive Care 40-64 Years, Female Preventive care refers to lifestyle choices and visits with your health care provider that can promote health and wellness. What does preventive care include?  A yearly physical exam. This is also called an annual well check.  Dental exams once or twice a year.  Routine eye exams. Ask your health care provider how often you should have your eyes checked.  Personal lifestyle choices, including:  Daily care of your teeth and gums.  Regular physical activity.  Eating a healthy diet.  Avoiding tobacco and drug use.  Limiting alcohol use.  Practicing safe sex.  Taking low-dose aspirin daily starting at age 77.  Taking vitamin and mineral supplements as recommended by your health care provider. What happens during an annual well check? The services and screenings done by your health care provider during your annual well check will depend on your age,  overall health, lifestyle risk factors, and family history of disease. Counseling  Your health care provider may ask you questions about your:  Alcohol use.  Tobacco use.  Drug use.  Emotional well-being.  Home and relationship well-being.  Sexual activity.  Eating habits.  Work and work Statistician.  Method of birth control.  Menstrual cycle.  Pregnancy history. Screening  You may have the following tests or measurements:  Height, weight, and BMI.  Blood pressure.  Lipid and cholesterol levels. These may be checked every 5 years, or more frequently if you are over 18 years old.  Skin check.  Lung cancer screening. You may have this screening every year starting at age 45 if you have a 30-pack-year history of smoking and currently smoke or have quit within the past 15 years.  Fecal occult blood test (FOBT) of the stool. You may have this test every year starting at age 26.  Flexible sigmoidoscopy or colonoscopy. You may have a sigmoidoscopy every 5 years or a colonoscopy every 10 years starting at age 18.  Hepatitis C blood test.  Hepatitis B blood test.  Sexually transmitted disease (STD) testing.  Diabetes screening. This is done by checking your blood sugar (glucose) after you have not eaten for a while (fasting). You may have this done every 1-3 years.  Mammogram. This may be done every 1-2 years. Talk to your health care provider about when you should start having regular mammograms. This may depend on whether you have a family history of breast cancer.  BRCA-related cancer screening. This may be done if you have a family history of breast, ovarian, tubal, or peritoneal  cancers.  Pelvic exam and Pap test. This may be done every 3 years starting at age 55. Starting at age 50, this may be done every 5 years if you have a Pap test in combination with an HPV test.  Bone density scan. This is done to screen for osteoporosis. You may have this scan if you are at  high risk for osteoporosis. Discuss your test results, treatment options, and if necessary, the need for more tests with your health care provider. Vaccines  Your health care provider may recommend certain vaccines, such as:  Influenza vaccine. This is recommended every year.  Tetanus, diphtheria, and acellular pertussis (Tdap, Td) vaccine. You may need a Td booster every 10 years.  Zoster vaccine. You may need this after age 9.  Pneumococcal 13-valent conjugate (PCV13) vaccine. You may need this if you have certain conditions and were not previously vaccinated.  Pneumococcal polysaccharide (PPSV23) vaccine. You may need one or two doses if you smoke cigarettes or if you have certain conditions. Talk to your health care provider about which screenings and vaccines you need and how often you need them. This information is not intended to replace advice given to you by your health care provider. Make sure you discuss any questions you have with your health care provider. Document Released: 06/12/2015 Document Revised: 02/03/2016 Document Reviewed: 03/17/2015 Elsevier Interactive Patient Education  2017 [place] Prevention in the Home Falls can cause injuries. They can happen to people of all ages. There are many things you can do to make your home safe and to help prevent falls. What can I do on the outside of my home?  Regularly fix the edges of walkways and driveways and fix any cracks.  Remove anything that might make you trip as you walk through a door, such as a raised step or threshold.  Trim any bushes or trees on the path to your home.  Use bright outdoor lighting.  Clear any walking paths of anything that might make someone trip, such as rocks or tools.  Regularly check to see if handrails are loose or broken. Make sure that both sides of any steps have handrails.  Any raised decks and porches should have guardrails on the edges.  Have any leaves, snow, or  ice cleared regularly.  Use sand or salt on walking paths during winter.  Clean up any spills in your garage right away. This includes oil or grease spills. What can I do in the bathroom?  Use night lights.  Install grab bars by the toilet and in the tub and shower. Do not use towel bars as grab bars.  Use non-skid mats or decals in the tub or shower.  If you need to sit down in the shower, use a plastic, non-slip stool.  Keep the floor dry. Clean up any water that spills on the floor as soon as it happens.  Remove soap buildup in the tub or shower regularly.  Attach bath mats securely with double-sided non-slip rug tape.  Do not have throw rugs and other things on the floor that can make you trip. What can I do in the bedroom?  Use night lights.  Make sure that you have a light by your bed that is easy to reach.  Do not use any sheets or blankets that are too big for your bed. They should not hang down onto the floor.  Have a firm chair that has side arms. You can use  this for support while you get dressed.  Do not have throw rugs and other things on the floor that can make you trip. What can I do in the kitchen?  Clean up any spills right away.  Avoid walking on wet floors.  Keep items that you use a lot in easy-to-reach places.  If you need to reach something above you, use a strong step stool that has a grab bar.  Keep electrical cords out of the way.  Do not use floor polish or wax that makes floors slippery. If you must use wax, use non-skid floor wax.  Do not have throw rugs and other things on the floor that can make you trip. What can I do with my stairs?  Do not leave any items on the stairs.  Make sure that there are handrails on both sides of the stairs and use them. Fix handrails that are broken or loose. Make sure that handrails are as long as the stairways.  Check any carpeting to make sure that it is firmly attached to the stairs. Fix any carpet  that is loose or worn.  Avoid having throw rugs at the top or bottom of the stairs. If you do have throw rugs, attach them to the floor with carpet tape.  Make sure that you have a light switch at the top of the stairs and the bottom of the stairs. If you do not have them, ask someone to add them for you. What else can I do to help prevent falls?  Wear shoes that:  Do not have high heels.  Have rubber bottoms.  Are comfortable and fit you well.  Are closed at the toe. Do not wear sandals.  If you use a stepladder:  Make sure that it is fully opened. Do not climb a closed stepladder.  Make sure that both sides of the stepladder are locked into place.  Ask someone to hold it for you, if possible.  Clearly mark and make sure that you can see:  Any grab bars or handrails.  First and last steps.  Where the edge of each step is.  Use tools that help you move around (mobility aids) if they are needed. These include:  Canes.  Walkers.  Scooters.  Crutches.  Turn on the lights when you go into a dark area. Replace any light bulbs as soon as they burn out.  Set up your furniture so you have a clear path. Avoid moving your furniture around.  If any of your floors are uneven, fix them.  If there are any pets around you, be aware of where they are.  Review your medicines with your doctor. Some medicines can make you feel dizzy. This can increase your chance of falling. Ask your doctor what other things that you can do to help prevent falls. This information is not intended to replace advice given to you by your health care provider. Make sure you discuss any questions you have with your health care provider. Document Released: 03/12/2009 Document Revised: 10/22/2015 Document Reviewed: 06/20/2014 Elsevier Interactive Patient Education  2017 [employer].

## 2018-05-04 LAB — RPR: RPR: NONREACTIVE

## 2018-05-04 LAB — HSV(HERPES SIMPLEX VRS) I + II AB-IGG
HSV 1 Glycoprotein G Ab, IgG: 25.1 index — ABNORMAL HIGH (ref 0.00–0.90)
HSV 2 IgG, Type Spec: 5.82 index — ABNORMAL HIGH (ref 0.00–0.90)

## 2018-05-05 LAB — GC/CHLAMYDIA PROBE AMP
Chlamydia trachomatis, NAA: NEGATIVE
Neisseria gonorrhoeae by PCR: NEGATIVE

## 2018-05-06 MED ORDER — METRONIDAZOLE 500 MG PO TABS: 500.0000 mg | ORAL_TABLET | Freq: Two times a day (BID) | ORAL | 0 refills | Status: DC

## 2018-05-14 ENCOUNTER — Encounter: Payer: Self-pay | Admitting: *Deleted

## 2018-05-18 ENCOUNTER — Other Ambulatory Visit: Payer: Self-pay | Admitting: Family Medicine

## 2018-05-18 ENCOUNTER — Other Ambulatory Visit: Payer: Self-pay

## 2018-05-18 NOTE — Telephone Encounter (Signed)
Requested Prescriptions  Pending Prescriptions Disp Refills  . sertraline (ZOLOFT) 100 MG tablet [Pharmacy Med Name: SERTRALINE HCL 100 MG TABLET] 90 tablet 3    Sig: TAKE ONE TABLET BY MOUTH EVERY DAY     Psychiatry:  Antidepressants - SSRI Passed - 05/18/2018 12:04 PM      Passed - Completed PHQ-2 or PHQ-9 in the last 360 days.      Passed - Valid encounter within last 6 months    Recent Outpatient Visits          2 weeks ago BV (bacterial vaginosis)   [facility] Merrie Roof [place], [place]   4 weeks ago Screening for colon cancer   [facility] Guadalupe Maple, MD   1 month ago Ganglion cyst   [facility]ney [place], [place]   2 months ago Viral URI   [facility] Merrie Roof [place], [place]   5 months ago Essential hypertension   [place], Jeannette How, MD      Future Appointments            In 101 months [facility], [place]

## 2018-05-19 MED ORDER — SERTRALINE HCL 100 MG PO TABS: 100.0000 mg | ORAL_TABLET | Freq: Every day | ORAL | 11 refills | Status: DC

## 2018-05-28 ENCOUNTER — Ambulatory Visit
Admission: RE | Admit: 2018-05-28 | Discharge: 2018-05-28 | Disposition: A | Discharge status: Home / Self Care | Payer: Medicare Other | Source: Ambulatory Visit | Attending: Family Medicine | Admitting: Family Medicine

## 2018-05-28 ENCOUNTER — Ambulatory Visit (INDEPENDENT_AMBULATORY_CARE_PROVIDER_SITE_OTHER): Payer: Medicare Other | Admitting: Family Medicine

## 2018-05-28 ENCOUNTER — Encounter: Payer: Self-pay | Admitting: Family Medicine

## 2018-05-28 VITALS — BP 147/84 | HR 65 | Temp 97.9°F | Ht 64.0 in | Wt 123.3 lb

## 2018-05-28 DIAGNOSIS — S299XXA Unspecified injury of thorax, initial encounter: Secondary | ICD-10-CM | POA: Diagnosis not present

## 2018-05-28 DIAGNOSIS — R0781 Pleurodynia: Secondary | ICD-10-CM

## 2018-05-28 DIAGNOSIS — Z1211 Encounter for screening for malignant neoplasm of colon: Secondary | ICD-10-CM

## 2018-05-28 DIAGNOSIS — K529 Noninfective gastroenteritis and colitis, unspecified: Secondary | ICD-10-CM | POA: Diagnosis not present

## 2018-05-28 NOTE — Patient Instructions (Signed)
[facility] [place] [facility] [address], [place], [place] [address]   

## 2018-05-28 NOTE — Progress Notes (Signed)
BP (!) 147/84 (BP Location: Left Arm, Patient Position: Sitting, Cuff Size: Normal)   Pulse 65   Temp 97.9 F (36.6 C) (Oral)   Ht 5\' 4"  (1.626 m)   Wt 123 lb 4.8 oz (55.9 kg)   SpO2 98%   BMI 21.16 kg/m    Subjective:    Patient ID: Caitlyn May, female    DOB: 27-Jun-1955, 62 y.o.   MRN: [id number]  HPI: Caitlyn May is a 62 y.o. female  Chief Complaint  Patient presents with  . Diarrhea    Having frequent bowel movements, more diarrhea. Been taking imodium, not working.   [place] on left side Christmas Eve, sharp pain near ribs.    Here today following up on long history of chronic diarrhea that is not improved with imodium or probiotics. Tested for c diff earlier this year which was negative. All workup thus far has been negative. Denies any constipation, abdominal pain, nausea, vomiting, fevers, rectal bleeding.   Tripped and hit her right side 6 days ago, was improving from that but this morning re-injured it bending over to get something off the floor. Has not been taking anything other than tylenol for the pain with no relief. Denies bruising, shortness of breath, CP, N/V.   Past Medical History:  Diagnosis Date  . Chronic kidney disease   . Depression   . Failed back syndrome, lumbar   . Hyperlipidemia   . Hypertension   . Osteoporosis   . Weight loss    Social History   Socioeconomic History  . Marital status: Married    Spouse name: Not on file  . Number of children: Not on file  . Years of education: 30  . Highest education level: 10th grade  Occupational History  . Not on file  Social Needs  . Financial resource strain: Not very hard  . Food insecurity:    Worry: Never true    Inability: Sometimes true  . Transportation needs:    Medical: No    Non-medical: No  Tobacco Use  . Smoking status: Current Every Day Smoker    Packs/day: 2.00    Types: Cigarettes  . Smokeless tobacco: Never Used  Substance and Sexual Activity  .  Alcohol use: No  . Drug use: Yes    Types: Marijuana  . Sexual activity: Not on file  Lifestyle  . Physical activity:    Days per week: 0 days    Minutes per session: 0 min  . Stress: Rather much  Relationships  . Social connections:    Talks on phone: Twice a week    Gets together: Once a week    Attends religious service: Never    Active member of club or organization: No    Attends meetings of clubs or organizations: Never    Relationship status: Living with partner  . Intimate partner violence:    Fear of current or ex partner: No    Emotionally abused: No    Physically abused: No    Forced sexual activity: No  Other Topics Concern  . Not on file  Social History Narrative  . Not on file    Relevant past medical, surgical, family and social history reviewed and updated as indicated. Interim medical history since our last visit reviewed. Allergies and medications reviewed and updated.  Review of Systems  Per HPI unless specifically indicated above     Objective:    BP (!) 147/84 (BP Location:  Left Arm, Patient Position: Sitting, Cuff Size: Normal)   Pulse 65   Temp 97.9 F (36.6 C) (Oral)   Ht 5\' 4"  (1.626 m)   Wt 123 lb 4.8 oz (55.9 kg)   SpO2 98%   BMI 21.16 kg/m   Wt Readings from Last 3 Encounters:  05/28/18 123 lb 4.8 oz (55.9 kg)  05/03/18 121 lb (54.9 kg)  05/03/18 122 lb (55.3 kg)    Physical Exam Vitals signs and nursing note reviewed.  Constitutional:      Appearance: Normal appearance. She is not ill-appearing.  HENT:     Head: Atraumatic.     Mouth/Throat:     Mouth: Mucous membranes are moist.     Pharynx: Oropharynx is clear.  Eyes:     Extraocular Movements: Extraocular movements intact.     Conjunctiva/sclera: Conjunctivae normal.  Neck:     Musculoskeletal: Normal range of motion and neck supple.  Cardiovascular:     Rate and Rhythm: Normal rate and regular rhythm.     Heart sounds: Normal heart sounds.  Pulmonary:     Effort:  Pulmonary effort is normal.     Breath sounds: Normal breath sounds.  Chest:     Chest wall: Tenderness (right ribs ttp. no palpable defect or bruising in the area) present.  Abdominal:     General: Bowel sounds are normal. There is no distension.     Palpations: Abdomen is soft.     Tenderness: There is no abdominal tenderness.  Musculoskeletal: Normal range of motion.  Skin:    General: Skin is warm and dry.  Neurological:     Mental Status: She is alert and oriented to person, place, and time.  Psychiatric:        Mood and Affect: Mood normal.        Thought Content: Thought content normal.        Judgment: Judgment normal.     Results for orders placed or performed in visit on 05/03/18  GC/Chlamydia Probe Amp  Result Value Ref Range   Chlamydia trachomatis, NAA Negative Negative   Neisseria gonorrhoeae by PCR Negative Negative  WET PREP FOR TRICH, YEAST, CLUE  Result Value Ref Range   Trichomonas Exam Negative Negative   Yeast Exam Negative Negative   Clue Cell Exam Positive (A) Negative  RPR  Result Value Ref Range   RPR Ser Ql Non Reactive Non Reactive  HSV(herpes simplex vrs) 1+2 ab-IgG  Result Value Ref Range   HSV 1 Glycoprotein G Ab, IgG 25.10 (H) 0.00 - 0.90 index   HSV 2 IgG, Type Spec 5.82 (H) 0.00 - 0.90 index      Assessment & Plan:   Problem List Items Addressed This Visit    None    Visit Diagnoses    Rib pain on right side    -  Primary   Await x-ray report. Lidocaine patches, heating pad, flexeril, tylenol prn. Rest, return precautions reviewed   Relevant Orders   DG Ribs Unilateral Right (Completed)   Screening for colon cancer       Relevant Orders   Ambulatory referral to Gastroenterology   Chronic diarrhea       Will refer to GI given chronicity. Continue probiotics and imodium. Suspect IBS D   Relevant Orders   Ambulatory referral to Gastroenterology       Follow up plan: Return for as scheduled.

## 2018-05-29 ENCOUNTER — Telehealth: Payer: Self-pay | Admitting: Family Medicine

## 2018-05-29 NOTE — Telephone Encounter (Signed)
Left message on voicemail for pt to return call to office for results. See result note

## 2018-05-29 NOTE — Telephone Encounter (Signed)
Pt called to get results.  Copied from [place] [phone]. Topic: Quick Communication - Lab Results (Clinic Use ONLY) >> May 29, 2018  9:52 AM Amada Kingfisher, CMA wrote: Called patient to inform them of recent lab results. When patient returns call, triage nurse may disclose results.

## 2018-06-01 ENCOUNTER — Encounter: Payer: Self-pay | Admitting: Gastroenterology

## 2018-06-15 ENCOUNTER — Other Ambulatory Visit: Payer: Self-pay | Admitting: Family Medicine

## 2018-06-15 DIAGNOSIS — M961 Postlaminectomy syndrome, not elsewhere classified: Secondary | ICD-10-CM

## 2018-06-15 NOTE — Telephone Encounter (Signed)
Copied from [place] [phone]. Topic: Quick Communication - See Telephone Encounter >> Jun 15, 2018  2:48 PM Conception Chancy, NT wrote: CRM for notification. See Telephone encounter for: 06/15/18.  Patient is requesting a refill for cyclobenzaprine (FLEXERIL) 5 MG tablet.  [place], [place], [place] - [address] [place] [address] Phone: [phone] Fax: [phone]

## 2018-06-15 NOTE — Telephone Encounter (Signed)
Requested medication (s) are due for refill today: yes  Requested medication (s) are on the active medication list: yes    Last refill: 12/11/17  #60  2 refills  Future visit scheduled no  Notes to clinic:not delegated  Requested Prescriptions  Pending Prescriptions Disp Refills   cyclobenzaprine (FLEXERIL) 5 MG tablet 60 tablet 2    Sig: Take 1 tablet (5 mg total) by mouth daily as needed for muscle spasms.     Not Delegated - Analgesics:  Muscle Relaxants Failed - 06/15/2018  2:57 PM      Failed - This refill cannot be delegated      Passed - Valid encounter within last 6 months    Recent Outpatient Visits          2 weeks ago Rib pain on right side   [facility] Merrie Roof [place], [place]   1 month ago BV (bacterial vaginosis)   [facility], [place], [place]   1 month ago Screening for colon cancer   [facility] Guadalupe Maple, MD   2 months ago Ganglion cyst   [place], [place]   3 months ago Viral URI   [place], Lilia Argue, [place]      Future Appointments            In 10 months [facility], [place]

## 2018-06-16 ENCOUNTER — Other Ambulatory Visit: Payer: Self-pay | Admitting: Family Medicine

## 2018-06-16 DIAGNOSIS — M961 Postlaminectomy syndrome, not elsewhere classified: Secondary | ICD-10-CM

## 2018-06-17 MED ORDER — CYCLOBENZAPRINE HCL 5 MG PO TABS: 5.0000 mg | ORAL_TABLET | Freq: Every day | ORAL | 2 refills | Status: DC | PRN

## 2018-06-18 ENCOUNTER — Other Ambulatory Visit: Payer: Self-pay

## 2018-06-18 MED ORDER — MONTELUKAST SODIUM 10 MG PO TABS: 10.0000 mg | ORAL_TABLET | Freq: Every day | ORAL | 11 refills | Status: DC

## 2018-06-21 ENCOUNTER — Encounter: Payer: Self-pay | Admitting: Family Medicine

## 2018-06-21 ENCOUNTER — Ambulatory Visit (INDEPENDENT_AMBULATORY_CARE_PROVIDER_SITE_OTHER): Payer: Medicare Other | Admitting: Family Medicine

## 2018-06-21 ENCOUNTER — Other Ambulatory Visit: Payer: Self-pay

## 2018-06-21 VITALS — BP 150/94 | HR 87 | Temp 97.6°F | Ht 64.0 in | Wt 117.0 lb

## 2018-06-21 DIAGNOSIS — M5431 Sciatica, right side: Secondary | ICD-10-CM

## 2018-06-21 MED ORDER — GABAPENTIN 300 MG PO CAPS: 300.0000 mg | ORAL_CAPSULE | Freq: Every evening | ORAL | 0 refills | Status: DC | PRN

## 2018-06-21 MED ORDER — PREDNISONE 10 MG PO TABS: ORAL_TABLET | ORAL | 0 refills | Status: DC

## 2018-06-21 NOTE — Progress Notes (Signed)
BP (!) 150/94   Pulse 87   Temp 97.6 F (36.4 C) (Oral)   Ht 5\' 4"  (1.626 m)   Wt 117 lb (53.1 kg)   SpO2 98%   BMI 20.08 kg/m    Subjective:    Patient ID: Caitlyn May, female    DOB: 1956/02/14, 63 y.o.   MRN: [id number]  HPI: Caitlyn May is a 63 y.o. female  Chief Complaint  Patient presents with  . Hip Pain    right side pain. hip and down to leg. states fell last Saturday. pt has tried OTC tylenol and street percocet   [place] off an upside down bucket almost a week ago onto the right posterior hip. States since has had to crawl around all week at home. Worse with weight bearing, shoots down right leg. Numbness and tingling down to foot. Currently taking flexeril and meloxicam without relief. Taking two 20 mg percocet daily that she bought off the street Monday as well - states she was desperate for relief. Hx of sciatica and chronic back issues.   Used to follow with pain management at [facility] for years, has not been back for 2 years.   Relevant past medical, surgical, family and social history reviewed and updated as indicated. Interim medical history since our last visit reviewed. Allergies and medications reviewed and updated.  Review of Systems  Per HPI unless specifically indicated above     Objective:    BP (!) 150/94   Pulse 87   Temp 97.6 F (36.4 C) (Oral)   Ht 5\' 4"  (1.626 m)   Wt 117 lb (53.1 kg)   SpO2 98%   BMI 20.08 kg/m   Wt Readings from Last 3 Encounters:  06/21/18 117 lb (53.1 kg)  05/28/18 123 lb 4.8 oz (55.9 kg)  05/03/18 121 lb (54.9 kg)    Physical Exam Vitals signs and nursing note reviewed.  Constitutional:      Appearance: Normal appearance. She is not ill-appearing.  HENT:     Head: Atraumatic.  Eyes:     Extraocular Movements: Extraocular movements intact.     Conjunctiva/sclera: Conjunctivae normal.  Neck:     Musculoskeletal: Normal range of motion and neck supple.  Cardiovascular:     Rate and Rhythm: Normal rate  and regular rhythm.     Heart sounds: Normal heart sounds.  Pulmonary:     Effort: Pulmonary effort is normal.     Breath sounds: Normal breath sounds.  Musculoskeletal: Normal range of motion.     Comments: Antalgic gait - SLR b/l No midline ttp, ttp right paraspinal muscles going into right hip  Skin:    General: Skin is warm and dry.  Neurological:     Mental Status: She is alert and oriented to person, place, and time.  Psychiatric:        Mood and Affect: Mood normal.        Thought Content: Thought content normal.        Judgment: Judgment normal.     Results for orders placed or performed in visit on 05/03/18  GC/Chlamydia Probe Amp  Result Value Ref Range   Chlamydia trachomatis, NAA Negative Negative   Neisseria gonorrhoeae by PCR Negative Negative  WET PREP FOR TRICH, YEAST, CLUE  Result Value Ref Range   Trichomonas Exam Negative Negative   Yeast Exam Negative Negative   Clue Cell Exam Positive (A) Negative  RPR  Result Value Ref Range   RPR Ser Ql Non  Reactive Non Reactive  HSV(herpes simplex vrs) 1+2 ab-IgG  Result Value Ref Range   HSV 1 Glycoprotein G Ab, IgG 25.10 (H) 0.00 - 0.90 index   HSV 2 IgG, Type Spec 5.82 (H) 0.00 - 0.90 index      Assessment & Plan:   Problem List Items Addressed This Visit    None    Visit Diagnoses    Sciatica of right side    -  Primary   Start prednisone and prn gabapentin, continue flexeril. Stretches, heat, epsom salt soaks reviewed. F/u if not improving.    Relevant Medications   gabapentin (NEURONTIN) 300 MG capsule     Discussed importance of not obtaining medications off the street as it is not a controlled environment and she concurrently takes klonopin. Pt understanding.   Follow up plan: No follow-ups on file.

## 2018-06-25 ENCOUNTER — Emergency Department
Admission: EM | Admit: 2018-06-25 | Discharge: 2018-06-25 | Disposition: A | Discharge status: Home / Self Care | Payer: Medicare Other | Attending: Emergency Medicine | Admitting: Emergency Medicine

## 2018-06-25 ENCOUNTER — Other Ambulatory Visit: Payer: Self-pay

## 2018-06-25 ENCOUNTER — Encounter: Payer: Self-pay | Admitting: Emergency Medicine

## 2018-06-25 ENCOUNTER — Emergency Department: Payer: Medicare Other

## 2018-06-25 DIAGNOSIS — M5441 Lumbago with sciatica, right side: Secondary | ICD-10-CM | POA: Diagnosis not present

## 2018-06-25 DIAGNOSIS — M545 Low back pain: Secondary | ICD-10-CM | POA: Diagnosis not present

## 2018-06-25 DIAGNOSIS — I10 Essential (primary) hypertension: Secondary | ICD-10-CM | POA: Diagnosis not present

## 2018-06-25 DIAGNOSIS — F17210 Nicotine dependence, cigarettes, uncomplicated: Secondary | ICD-10-CM | POA: Diagnosis not present

## 2018-06-25 DIAGNOSIS — J45909 Unspecified asthma, uncomplicated: Secondary | ICD-10-CM | POA: Insufficient documentation

## 2018-06-25 DIAGNOSIS — I129 Hypertensive chronic kidney disease with stage 1 through stage 4 chronic kidney disease, or unspecified chronic kidney disease: Secondary | ICD-10-CM | POA: Insufficient documentation

## 2018-06-25 DIAGNOSIS — N189 Chronic kidney disease, unspecified: Secondary | ICD-10-CM | POA: Diagnosis not present

## 2018-06-25 DIAGNOSIS — Z79899 Other long term (current) drug therapy: Secondary | ICD-10-CM | POA: Insufficient documentation

## 2018-06-25 LAB — COMPREHENSIVE METABOLIC PANEL
ALT: 16 U/L (ref 0–44)
AST: 17 U/L (ref 15–41)
Albumin: 4.1 g/dL (ref 3.5–5.0)
Alkaline Phosphatase: 61 U/L (ref 38–126)
Anion gap: 7 (ref 5–15)
BUN: 14 mg/dL (ref 8–23)
CO2: 23 mmol/L (ref 22–32)
Calcium: 8.8 mg/dL — ABNORMAL LOW (ref 8.9–10.3)
Chloride: 108 mmol/L (ref 98–111)
Creatinine, Ser: 0.89 mg/dL (ref 0.44–1.00)
GFR calc non Af Amer: >60 mL/min (ref >60)
Glucose, Bld: 121 mg/dL — ABNORMAL HIGH (ref 70–99)
POTASSIUM: 3.7 mmol/L (ref 3.5–5.1)
Sodium: 138 mmol/L (ref 135–145)
Total Bilirubin: 0.5 mg/dL (ref 0.3–1.2)
Total Protein: 7.2 g/dL (ref 6.5–8.1)

## 2018-06-25 LAB — CBC
HCT: 44.6 % (ref 36.0–46.0)
Hemoglobin: 14.7 g/dL (ref 12.0–15.0)
MCH: 32.3 pg (ref 26.0–34.0)
MCHC: 33 g/dL (ref 30.0–36.0)
MCV: 98 fL (ref 80.0–100.0)
Platelets: 265 10*3/uL (ref 150–400)
RBC: 4.55 MIL/uL (ref 3.87–5.11)
RDW: 11.7 % (ref 11.5–15.5)
WBC: 7.6 10*3/uL (ref 4.0–10.5)
nRBC: 0 % (ref 0.0–0.2)

## 2018-06-25 LAB — URINALYSIS, COMPLETE (UACMP) WITH MICROSCOPIC
Bacteria, UA: NONE SEEN
Bilirubin Urine: NEGATIVE
Glucose, UA: NEGATIVE mg/dL
KETONES UR: NEGATIVE mg/dL
Leukocytes, UA: NEGATIVE
Nitrite: NEGATIVE
Protein, ur: NEGATIVE mg/dL
Specific Gravity, Urine: 1.005 (ref 1.005–1.030)
pH: 5 (ref 5.0–8.0)

## 2018-06-25 LAB — TROPONIN I: Troponin I: <0.03 ng/mL (ref <0.03)

## 2018-06-25 MED ORDER — SODIUM CHLORIDE 0.9% FLUSH
3.0000 mL | Freq: Once | INTRAVENOUS | Status: AC
  Administered 2018-06-25: 3 mL via INTRAVENOUS

## 2018-06-25 MED ORDER — KETOROLAC TROMETHAMINE 30 MG/ML IJ SOLN
30.0000 mg | Freq: Once | INTRAMUSCULAR | Status: AC
  Administered 2018-06-25: 30 mg via INTRAVENOUS
  Filled 2018-06-25: qty 1

## 2018-06-25 MED ORDER — KETOROLAC TROMETHAMINE 30 MG/ML IJ SOLN: 30.0000 mg | Freq: Once | INTRAMUSCULAR | Status: DC

## 2018-06-25 MED ORDER — NAPROXEN 500 MG PO TABS: 500.0000 mg | ORAL_TABLET | Freq: Two times a day (BID) | ORAL | 2 refills | Status: DC

## 2018-06-25 NOTE — ED Provider Notes (Addendum)
[facility] Emergency Department Provider Note   ____________________________________________    I have reviewed the triage vital signs and the nursing notes.   HISTORY  Chief Complaint Back pain    HPI Caitlyn May is a 63 y.o. female who presents with complaints of low back pain.  Patient reports low back pain with some radiation to her right leg.  Recently treated/diagnosed as sciatica with steroids.  She has had little improvement.  She is frustrated because she did not have an x-ray despite telling her physician that she fell onto her back a week ago.  No neurological deficits, no incontinence, normal strength.  Past Medical History:  Diagnosis Date  . Chronic kidney disease   . Depression   . Failed back syndrome, lumbar   . Hyperlipidemia   . Hypertension   . Osteoporosis   . Weight loss     Patient Active Problem List   Diagnosis Date Noted  . Chest pain 04/19/2018  . Anxiety 04/19/2018  . Syncope 12/11/2017  . Allergic rhinitis 09/28/2017  . Asthma due to environmental allergies 08/22/2017  . Cervical spine tumor 08/22/2017  . Weight loss 07/18/2017  . Advanced care planning/counseling discussion 04/14/2017  . Vitamin D deficiency 02/07/2017  . Disturbance of skin sensation 11/29/2016  . Chronic shoulder pain (Location of Tertiary source of pain) (Bilateral) (R>L) 11/29/2016  . Chronic upper extremity pain (Bilateral) (R>L) 11/29/2016  . Chronic cervical radicular pain (Bilateral) (R>L) 11/29/2016  . Chronic hip pain (Bilateral) (R>L) 11/29/2016  . Osteoarthritis of hip (Bilateral) 11/29/2016  . Chronic sacroiliac joint pain (Bilateral) 11/29/2016  . Lumbar facet syndrome (Right) 11/29/2016  . Long term prescription benzodiazepine use 11/28/2016  . Chronic pain syndrome 11/28/2016  . Long term (current) use of opiate analgesic 11/28/2016  . Long term prescription opiate use 11/28/2016  . Opiate use 11/28/2016  . Failed  back surgical syndrome 11/28/2016  . Chronic low back pain (Location of Primary Source of Pain) (Bilateral) (R>L) 11/28/2016  . Lumbar radiculitis (Right) 11/28/2016  . Chronic lower extremity pain (Bilateral) (R>L) 11/28/2016  . Chronic lumbar radicular pain (Bilateral) (R>L) 11/28/2016  . Chronic neck pain (Location of Secondary source of pain) (Bilateral) (R>L) 11/28/2016  . DDD (degenerative disc disease), lumbar 11/28/2016  . DDD (degenerative disc disease), cervical 11/28/2016  . Elevated TSH 07/08/2015  . Acid reflux 11/19/2014  . Depression   . Hyperlipidemia   . Hypertension   . Postlaminectomy syndrome, lumbar region 11/17/2014    Past Surgical History:  Procedure Laterality Date  . ABDOMINAL HYSTERECTOMY    . CESAREAN SECTION    . MOUTH SURGERY    . SPINE SURGERY      Prior to Admission medications   Medication Sig Start Date End Date Taking? Authorizing Provider  albuterol (PROVENTIL HFA;VENTOLIN HFA) 108 (90 Base) MCG/ACT inhaler Inhale 2 puffs into the lungs every 6 (six) hours as needed for wheezing or shortness of breath. 05/26/17   Volney American, PA-C  atorvastatin (LIPITOR) 40 MG tablet Take 1 tablet (40 mg total) by mouth daily. 04/19/18   Guadalupe Maple, MD  benazepril (LOTENSIN) 40 MG tablet Take 1 tablet (40 mg total) by mouth daily. 04/19/18   Guadalupe Maple, MD  cetirizine (ZYRTEC) 10 MG tablet TAKE ONE TABLET BY MOUTH EVERY DAY 10/02/17   Johnson, Megan P, DO  clonazePAM (KLONOPIN) 0.5 MG tablet Take 1 tablet (0.5 mg total) by mouth 2 (two) times daily as needed. for anxiety  04/19/18   Guadalupe Maple, MD  cyclobenzaprine (FLEXERIL) 5 MG tablet Take 1 tablet (5 mg total) by mouth daily as needed for muscle spasms. 06/17/18   Guadalupe Maple, MD  gabapentin (NEURONTIN) 300 MG capsule Take 1 capsule (300 mg total) by mouth at bedtime as needed. 06/21/18   Volney American, PA-C  meloxicam [facility]) 7.5 MG tablet  06/15/18   [provider]  metoprolol succinate (TOPROL-XL) 50 MG 24 hr tablet TAKE ONE TABLET BY MOUTH EVERY DAY WITH A MEAL 04/19/18   Crissman, Jeannette How, MD  montelukast (SINGULAIR) 10 MG tablet Take 1 tablet (10 mg total) by mouth at bedtime. 06/18/18   Guadalupe Maple, MD  naproxen (NAPROSYN) 500 MG tablet Take 1 tablet (500 mg total) by mouth 2 (two) times daily with a meal. 06/25/18   Lavonia Drafts, MD  oxyCODONE-Acetaminophen (PERCOCET PO) Take by mouth 2 (two) times daily.    [provider]  predniSONE (DELTASONE) 10 MG tablet Take 6 tabs day one, 5 tabs day two, 4 tabs day three, etc 06/21/18   Volney American, PA-C  ranitidine (ZANTAC) 75 MG tablet Take 1 tablet (75 mg total) by mouth 2 (two) times daily. 03/31/15   Guadalupe Maple, MD  sertraline (ZOLOFT) 100 MG tablet Take 1 tablet (100 mg total) by mouth daily. 05/19/18   Guadalupe Maple, MD  triamcinolone (NASACORT AQ) 55 MCG/ACT AERO nasal inhaler Place 2 sprays into the nose daily. 09/14/16   Volney American, PA-C     Allergies Accupril [place] hcl]; Avelox [moxifloxacin]; Ciprofloxacin; Hctz [hydrochlorothiazide]; Penicillins; and Sulfa antibiotics  Family History  Problem Relation Age of Onset  . Hypertension Mother   . Dementia Mother   . Cancer Father   . Cancer Maternal Aunt        breast  . Diabetes Neg Hx   . Heart disease Neg Hx   . Stroke Neg Hx   . COPD Neg Hx     Social History Social History   Tobacco Use  . Smoking status: Current Every Day Smoker    Packs/day: 2.00    Types: Cigarettes  . Smokeless tobacco: Never Used  Substance Use Topics  . Alcohol use: No  . Drug use: Yes    Types: Marijuana    Review of Systems  Constitutional: No fever/chills  ENT: No neck pain   Gastrointestinal: No abdominal pain.  No nausea, no vomiting.   Genitourinary: Negative for dysuria. Musculoskeletal: Throbbing back pain, lower back. Skin: Negative for rash. Neurological: Negative for weakness or  numbness    ____________________________________________   PHYSICAL EXAM:  VITAL SIGNS: ED Triage Vitals  Enc Vitals Group     BP 06/25/18 1610 (!) 189/85     Pulse Rate 06/25/18 1610 72     Resp 06/25/18 1919 17     Temp 06/25/18 1610 98.7 F (37.1 C)     Temp Source 06/25/18 1610 Oral     SpO2 06/25/18 1610 95 %     Weight 06/25/18 1611 53.1 kg (117 lb)     Height 06/25/18 1611 1.626 m (5\' 4" )     Head Circumference --      Peak Flow --      Pain Score 06/25/18 1620 9     Pain Loc --      Pain Edu? --      Excl. in [place]? --      Constitutional: Alert and oriented. No acute  distress. Pleasant and interactive  Mouth/Throat: Mucous membranes are moist.   Cardiovascular: Normal rate, regular rhythm.  Respiratory: Normal respiratory effort.  No retractions.  Musculoskeletal: Back: No vertebral chest palpation, no swelling or bruising, normal strength in the lower extremities Neurologic:  Normal speech and language. No gross focal neurologic deficits are appreciated.   Skin:  Skin is warm, dry and intact. No rash noted.   ____________________________________________   LABS (all labs ordered are listed, but only abnormal results are displayed)  Labs Reviewed  URINALYSIS, COMPLETE (UACMP) WITH MICROSCOPIC - Abnormal; Notable for the following components:      Result Value   Color, Urine STRAW (*)    APPearance CLEAR (*)    Hgb urine dipstick SMALL (*)    All other components within normal limits  COMPREHENSIVE METABOLIC PANEL - Abnormal; Notable for the following components:   Glucose, Bld 121 (*)    Calcium 8.8 (*)    All other components within normal limits  CBC  TROPONIN I   ____________________________________________  EKG  ED ECG REPORT I, Lavonia Drafts, the attending physician, personally viewed and interpreted this ECG.  Date: 07/09/2018  Rhythm: normal sinus rhythm QRS Axis: normal Intervals: normal ST/T Wave abnormalities: normal Narrative  Interpretation: no evidence of acute ischemia  ____________________________________________  RADIOLOGY  Lumbar x-ray negative for fracture ____________________________________________   PROCEDURES  Procedure(s) performed: No  Procedures   Critical Care performed: No ____________________________________________   INITIAL IMPRESSION / ASSESSMENT AND PLAN / ED COURSE  Pertinent labs & imaging results that were available during my care of the patient were reviewed by me and considered in my medical decision making (see chart for details).  Lumbar x-ray negative for fracture, lab work unremarkable, patient treated with Toradol with some improvement.  Recommend that she continue her steroids will add NSAIDs, outpatient follow-up with PCP   ____________________________________________   FINAL CLINICAL IMPRESSION(S) / ED DIAGNOSES  Final diagnoses:  Acute right-sided low back pain with right-sided sciatica      NEW MEDICATIONS STARTED DURING THIS VISIT:  Discharge Medication List as of 06/25/2018  6:48 PM    START taking these medications   Details  naproxen (NAPROSYN) 500 MG tablet Take 1 tablet (500 mg total) by mouth 2 (two) times daily with a meal., Starting Mon 06/25/2018, Normal         Note:  This document was prepared using Dragon voice recognition software and may include unintentional dictation errors.   Lavonia Drafts, MD 06/25/18 2203    Lavonia Drafts, MD 07/09/18 [phone]

## 2018-06-25 NOTE — ED Triage Notes (Addendum)
Mechanical fall one week ago, low back pain radiating to R leg. During triage also states has syncopal episode 3 days ago.

## 2018-07-09 ENCOUNTER — Ambulatory Visit: Payer: Medicare Other | Admitting: Gastroenterology

## 2018-07-09 ENCOUNTER — Encounter: Payer: Self-pay | Admitting: Gastroenterology

## 2018-08-28 ENCOUNTER — Telehealth: Payer: Self-pay

## 2018-08-28 NOTE — Telephone Encounter (Signed)
Copied from [place] [phone]. Topic: Referral - Status >> Aug 28, 2018  2:47 PM Simone Curia D wrote: 9/98/0012 Patient's voicemail was full, unable to leave message, attempted to call contact given voicemail was not set-up.  Will attempt to call again 08/29/2018.MA

## 2018-09-03 ENCOUNTER — Telehealth: Payer: Self-pay

## 2018-09-03 NOTE — Telephone Encounter (Signed)
Copied from [place] [id number]. Topic: Referral - Status >> Sep 03, 2018 43:14 AM Simone Curia D wrote: 07/06/6699 spoke with patient about eligibility for silver sneakers, [employer], [employer] counselor and [facility].MA

## 2018-09-10 ENCOUNTER — Telehealth: Payer: Self-pay

## 2018-09-10 NOTE — Telephone Encounter (Signed)
Called patient and completed PHQ-9 over the phone.

## 2018-09-10 NOTE — Telephone Encounter (Signed)
Opened in error

## 2018-10-17 ENCOUNTER — Telehealth: Payer: Self-pay | Admitting: Family Medicine

## 2018-10-17 NOTE — Telephone Encounter (Signed)
Copied from [place] [phone]. Topic: Quick Communication - Rx Refill/Question >> Oct 17, 2018  1:47 PM Margot Ables wrote: Medication: clonazePAM (KLONOPIN) 0.5 MG tablet - 4 pills - takes 2/day prn  Has the patient contacted their pharmacy? Yes - said refill is expired and the office has not responded to requests Preferred Pharmacy (with phone number or street name): [place], [place] - [place] [phone] (Phone) [phone] (Fax)

## 2018-10-18 ENCOUNTER — Encounter: Payer: Self-pay | Admitting: Family Medicine

## 2018-10-18 ENCOUNTER — Other Ambulatory Visit: Payer: Self-pay

## 2018-10-18 ENCOUNTER — Ambulatory Visit (INDEPENDENT_AMBULATORY_CARE_PROVIDER_SITE_OTHER): Payer: Medicare Other | Admitting: Family Medicine

## 2018-10-18 DIAGNOSIS — F419 Anxiety disorder, unspecified: Secondary | ICD-10-CM | POA: Diagnosis not present

## 2018-10-18 DIAGNOSIS — J019 Acute sinusitis, unspecified: Secondary | ICD-10-CM | POA: Diagnosis not present

## 2018-10-18 DIAGNOSIS — I10 Essential (primary) hypertension: Secondary | ICD-10-CM

## 2018-10-18 DIAGNOSIS — R55 Syncope and collapse: Secondary | ICD-10-CM

## 2018-10-18 DIAGNOSIS — F339 Major depressive disorder, recurrent, unspecified: Secondary | ICD-10-CM

## 2018-10-18 MED ORDER — CLONAZEPAM 0.5 MG PO TABS: 0.5000 mg | ORAL_TABLET | Freq: Two times a day (BID) | ORAL | 5 refills | Status: DC | PRN

## 2018-10-18 MED ORDER — AZITHROMYCIN 250 MG PO TABS: ORAL_TABLET | ORAL | 0 refills | Status: DC

## 2018-10-18 NOTE — Assessment & Plan Note (Signed)
Discussed syncope most likely secondary to dehydration and orthostatic hypotension patient will increase fluids salt monitor blood pressure and if feels another spell, now will lay down with her feet higher than her nose and then subsequently drink a lot of water.  If symptoms persist on to the emergency room.

## 2018-10-18 NOTE — Progress Notes (Signed)
There were no vitals taken for this visit.   Subjective:    Patient ID: Caitlyn May, female    DOB: 12/03/1955, 63 y.o.   MRN: [id number]  HPI: Caitlyn May is a 63 y.o. female  Med check  Telemedicine using audio/video telecommunications for a synchronous communication visit. Today's visit due to COVID-19 isolation precautions I connected with and verified that I am speaking with the correct person using two identifiers.   I discussed the limitations, risks, security and privacy concerns of performing an evaluation and management service by telecommunication and the availability of in person appointments. I also discussed with the patient that there may be a patient responsible charge related to this service. The patient expressed understanding and agreed to proceed. The patient's location is home. I am at home.  Discussed with patient concerned about sinus infection is been ongoing for 6 weeks with facial pressure congestion drainage which is dark green.  Has sloshing sensation with bending over pressure especially behind her right eye with stopped up head and just seems to be getting worse.  Has tried multiple over-the-counter agents without success.  Routine follow-up for other medical problems are stable. Blood pressure no complaints from medications good control. Patient's chronic anxiety stable with medication uses medications without problems and in controlled fashion with prescription lasting 6 months. Chronic pain stable. Depression stable.  Also discussed syncope patient had about 6 weeks ago another episode of feeling lightheaded got up to go to the bathroom to put water on her face and then fell out did not hurt anything did not go to the emergency room subsequently felt okay and is been okay ever since on review patient is chronically dehydrated with dark yellow urine and does not urinate often. No other cardiac type symptoms are noted arrhythmias.  Relevant  past medical, surgical, family and social history reviewed and updated as indicated. Interim medical history since our last visit reviewed. Allergies and medications reviewed and updated.  Review of Systems  Constitutional: Negative.   Respiratory: Negative.   Cardiovascular: Negative.     Per HPI unless specifically indicated above     Objective:    There were no vitals taken for this visit.  Wt Readings from Last 3 Encounters:  06/25/18 117 lb (53.1 kg)  06/21/18 117 lb (53.1 kg)  05/28/18 123 lb 4.8 oz (55.9 kg)    Physical Exam  Results for orders placed or performed during the hospital encounter of 06/25/18  CBC  Result Value Ref Range   WBC 7.6 4.0 - 10.5 K/uL   RBC 4.55 3.87 - 5.11 MIL/uL   Hemoglobin 14.7 12.0 - 15.0 g/dL   HCT 44.6 36.0 - 46.0 %   MCV 98.0 80.0 - 100.0 fL   MCH 32.3 26.0 - 34.0 pg   MCHC 33.0 30.0 - 36.0 g/dL   RDW 11.7 11.5 - 15.5 %   Platelets 265 150 - 400 K/uL   nRBC 0.0 0.0 - 0.2 %  Urinalysis, Complete w Microscopic  Result Value Ref Range   Color, Urine STRAW (A) YELLOW   APPearance CLEAR (A) CLEAR   Specific Gravity, Urine 1.005 1.005 - 1.030   pH 5.0 5.0 - 8.0   Glucose, UA NEGATIVE NEGATIVE mg/dL   Hgb urine dipstick SMALL (A) NEGATIVE   Bilirubin Urine NEGATIVE NEGATIVE   Ketones, ur NEGATIVE NEGATIVE mg/dL   Protein, ur NEGATIVE NEGATIVE mg/dL   Nitrite NEGATIVE NEGATIVE   Leukocytes, UA NEGATIVE NEGATIVE   RBC /  HPF 0-5 0 - 5 RBC/hpf   WBC, UA 0-5 0 - 5 WBC/hpf   Bacteria, UA NONE SEEN NONE SEEN   Squamous Epithelial / LPF 0-5 0 - 5  Comprehensive metabolic panel  Result Value Ref Range   Sodium 138 135 - 145 mmol/L   Potassium 3.7 3.5 - 5.1 mmol/L   Chloride 108 98 - 111 mmol/L   CO2 23 22 - 32 mmol/L   Glucose, Bld 121 (H) 70 - 99 mg/dL   BUN 14 8 - 23 mg/dL   Creatinine, Ser 0.89 0.44 - 1.00 mg/dL   Calcium 8.8 (L) 8.9 - 10.3 mg/dL   Total Protein 7.2 6.5 - 8.1 g/dL   Albumin 4.1 3.5 - 5.0 g/dL   AST 17 15 - 41  U/L   ALT 16 0 - 44 U/L   Alkaline Phosphatase 61 38 - 126 U/L   Total Bilirubin 0.5 0.3 - 1.2 mg/dL   GFR calc non Af Amer >60 >60 mL/min   GFR calc Af Amer >60 >60 mL/min   Anion gap 7 5 - 15  Troponin I - ONCE - STAT  Result Value Ref Range   Troponin I <0.03 <0.03 ng/mL      Assessment & Plan:   Problem List Items Addressed This Visit      Cardiovascular and Mediastinum   Hypertension    The current medical regimen is effective;  continue present plan and medications.       Syncope    Discussed syncope most likely secondary to dehydration and orthostatic hypotension patient will increase fluids salt monitor blood pressure and if feels another spell, now will lay down with her feet higher than her nose and then subsequently drink a lot of water.  If symptoms persist on to the emergency room.        Other   Depression, recurrent ([place])    The current medical regimen is effective;  continue present plan and medications.       Anxiety    Anxiety stable with current meds and treatment will continue       Other Visit Diagnoses    Acute sinusitis, recurrence not specified, unspecified location    -  Primary   Relevant Medications   azithromycin (ZITHROMAX) 250 MG tablet    Discussed sinusitis care and treatment use of a Z-Pak and over-the-counter medications.   I discussed the assessment and treatment plan with the patient. The patient was provided an opportunity to ask questions and all were answered. The patient agreed with the plan and demonstrated an understanding of the instructions.   The patient was advised to call back or seek an in-person evaluation if the symptoms worsen or if the condition fails to improve as anticipated.   I provided 21+ minutes of time during this encounter.  Follow up plan: Return in about 6 months (around 04/20/2019) for Physical Exam.

## 2018-10-18 NOTE — Assessment & Plan Note (Signed)
The current medical regimen is effective;  continue present plan and medications.  

## 2018-10-18 NOTE — Assessment & Plan Note (Signed)
Anxiety stable with current meds and treatment will continue

## 2018-10-24 ENCOUNTER — Other Ambulatory Visit: Payer: Self-pay

## 2018-10-24 MED ORDER — CETIRIZINE HCL 10 MG PO TABS: 10.0000 mg | ORAL_TABLET | Freq: Every day | ORAL | 11 refills | Status: DC

## 2018-11-07 ENCOUNTER — Other Ambulatory Visit: Payer: Self-pay

## 2018-11-08 MED ORDER — MELOXICAM 7.5 MG PO TABS: 7.5000 mg | ORAL_TABLET | Freq: Every day | ORAL | 3 refills | Status: DC

## 2019-01-18 ENCOUNTER — Other Ambulatory Visit: Payer: Self-pay | Admitting: Family Medicine

## 2019-01-18 ENCOUNTER — Telehealth: Payer: Self-pay

## 2019-01-18 DIAGNOSIS — M961 Postlaminectomy syndrome, not elsewhere classified: Secondary | ICD-10-CM

## 2019-01-18 NOTE — Telephone Encounter (Signed)
Routing to provider  

## 2019-01-18 NOTE — Telephone Encounter (Signed)
This refill cannot be delegated  Requested Prescriptions  Pending Prescriptions Disp Refills  . cyclobenzaprine (FLEXERIL) 5 MG tablet [Pharmacy Med Name: cyclobenzaprine 5 mg tablet] 60 tablet 2    Sig: TAKE ONE TABLET BY MOUTH ONCE DAILY AS NEEDED FOR MUSCLE SPASMS     Not Delegated - Analgesics:  Muscle Relaxants Failed - 01/18/2019  8:00 AM      Failed - This refill cannot be delegated      Passed - Valid encounter within last 6 months    Recent Outpatient Visits          3 months ago Acute sinusitis, recurrence not specified, unspecified location   [facility] [place], Jeannette How, MD   7 months ago Sciatica of right side   [place], [place]   7 months ago Rib pain on right side   [place], [place], [place]   8 months ago BV (bacterial vaginosis)   [place], [place], [place]   9 months ago Screening for colon cancer   [place], Jeannette How, MD      Future Appointments            In 3 months [place], [place]

## 2019-01-18 NOTE — Telephone Encounter (Signed)
Refill on cyclobenzaprine 5 mg

## 2019-03-28 ENCOUNTER — Other Ambulatory Visit: Payer: Self-pay | Admitting: Family Medicine

## 2019-04-08 ENCOUNTER — Ambulatory Visit (INDEPENDENT_AMBULATORY_CARE_PROVIDER_SITE_OTHER): Payer: Medicare Other | Admitting: Family Medicine

## 2019-04-08 ENCOUNTER — Other Ambulatory Visit: Payer: Self-pay

## 2019-04-08 ENCOUNTER — Encounter: Payer: Self-pay | Admitting: Family Medicine

## 2019-04-08 DIAGNOSIS — J0100 Acute maxillary sinusitis, unspecified: Secondary | ICD-10-CM

## 2019-04-08 MED ORDER — AZITHROMYCIN 250 MG PO TABS: ORAL_TABLET | ORAL | 0 refills | Status: DC

## 2019-04-08 NOTE — Progress Notes (Signed)
There were no vitals taken for this visit.   Subjective:    Patient ID: Caitlyn May, female    DOB: February 14, 1956, 63 y.o.   MRN: [id number]  HPI: Caitlyn May is a 63 y.o. female  Chief Complaint  Patient presents with  . URI    pt states she has been having a cough, congestion, pressure, drainage and yellow mucus since Friday night    . This visit was completed via WebEx due to the restrictions of the COVID-19 pandemic. All issues as above were discussed and addressed. Physical exam was done as above through visual confirmation on WebEx. If it was felt that the patient should be evaluated in the office, they were directed there. The patient verbally consented to this visit. . Location of the patient: home . Location of the provider: work . Those involved with this call:  . Provider: Merrie Roof, PA-C . CMA: Lesle Chris, [place] . Front Desk/Registration: Jill Side  . Time spent on call: 15 minutes with patient face to face via video conference. More than 50% of this time was spent in counseling and coordination of care. 5 minutes total spent in review of patient's record and preparation of their chart. I verified patient identity using two factors (patient name and date of birth). Patient consents verbally to being seen via telemedicine visit today.   Several weeks of congestion, sinus pain and pressure, cough, that worsened 3 days ago over the weekend. Now with significant right sided sinus pain, congestion and low grade fevers. Trying tylenol for headaches, hot showers, and allergy regimen without much relief. Denies sick contacts, CP, SOB, N/V/D, loss of taste or smell, recent travel.   Relevant past medical, surgical, family and social history reviewed and updated as indicated. Interim medical history since our last visit reviewed. Allergies and medications reviewed and updated.  Review of Systems  Per HPI unless specifically indicated above     Objective:    There  were no vitals taken for this visit.  Wt Readings from Last 3 Encounters:  06/25/18 117 lb (53.1 kg)  06/21/18 117 lb (53.1 kg)  05/28/18 123 lb 4.8 oz (55.9 kg)    Physical Exam Vitals signs and nursing note reviewed.  Constitutional:      General: She is not in acute distress.    Appearance: Normal appearance.  HENT:     Head: Atraumatic.     Right Ear: External ear normal.     Left Ear: External ear normal.     Nose: Congestion present.     Mouth/Throat:     Mouth: Mucous membranes are moist.     Pharynx: Oropharynx is clear. Posterior oropharyngeal erythema present.  Eyes:     Extraocular Movements: Extraocular movements intact.     Conjunctiva/sclera: Conjunctivae normal.  Neck:     Musculoskeletal: Normal range of motion.  Cardiovascular:     Comments: Unable to assess via virtual visit Pulmonary:     Effort: Pulmonary effort is normal. No respiratory distress.  Musculoskeletal: Normal range of motion.  Skin:    General: Skin is dry.     Findings: No erythema.  Neurological:     Mental Status: She is alert and oriented to person, place, and time.  Psychiatric:        Mood and Affect: Mood normal.        Thought Content: Thought content normal.        Judgment: Judgment normal.     Results for orders  placed or performed during the hospital encounter of 06/25/18  CBC  Result Value Ref Range   WBC 7.6 4.0 - 10.5 K/uL   RBC 4.55 3.87 - 5.11 MIL/uL   Hemoglobin 14.7 12.0 - 15.0 g/dL   HCT 44.6 36.0 - 46.0 %   MCV 98.0 80.0 - 100.0 fL   MCH 32.3 26.0 - 34.0 pg   MCHC 33.0 30.0 - 36.0 g/dL   RDW 11.7 11.5 - 15.5 %   Platelets 265 150 - 400 K/uL   nRBC 0.0 0.0 - 0.2 %  Urinalysis, Complete w Microscopic  Result Value Ref Range   Color, Urine STRAW (A) YELLOW   APPearance CLEAR (A) CLEAR   Specific Gravity, Urine 1.005 1.005 - 1.030   pH 5.0 5.0 - 8.0   Glucose, UA NEGATIVE NEGATIVE mg/dL   Hgb urine dipstick SMALL (A) NEGATIVE   Bilirubin Urine NEGATIVE  NEGATIVE   Ketones, ur NEGATIVE NEGATIVE mg/dL   Protein, ur NEGATIVE NEGATIVE mg/dL   Nitrite NEGATIVE NEGATIVE   Leukocytes, UA NEGATIVE NEGATIVE   RBC / HPF 0-5 0 - 5 RBC/hpf   WBC, UA 0-5 0 - 5 WBC/hpf   Bacteria, UA NONE SEEN NONE SEEN   Squamous Epithelial / LPF 0-5 0 - 5  Comprehensive metabolic panel  Result Value Ref Range   Sodium 138 135 - 145 mmol/L   Potassium 3.7 3.5 - 5.1 mmol/L   Chloride 108 98 - 111 mmol/L   CO2 23 22 - 32 mmol/L   Glucose, Bld 121 (H) 70 - 99 mg/dL   BUN 14 8 - 23 mg/dL   Creatinine, Ser 0.89 0.44 - 1.00 mg/dL   Calcium 8.8 (L) 8.9 - 10.3 mg/dL   Total Protein 7.2 6.5 - 8.1 g/dL   Albumin 4.1 3.5 - 5.0 g/dL   AST 17 15 - 41 U/L   ALT 16 0 - 44 U/L   Alkaline Phosphatase 61 38 - 126 U/L   Total Bilirubin 0.5 0.3 - 1.2 mg/dL   GFR calc non Af Amer >60 >60 mL/min   GFR calc Af Amer >60 >60 mL/min   Anion gap 7 5 - 15  Troponin I - ONCE - STAT  Result Value Ref Range   Troponin I <0.03 <0.03 ng/mL      Assessment & Plan:   Problem List Items Addressed This Visit    None    Visit Diagnoses    Acute maxillary sinusitis, recurrence not specified    -  Primary   Tx with zpak, mucinex, sinus rinses, allergy regimen. Supportive home care reviewed. F/u if not improving   Relevant Medications   azithromycin (ZITHROMAX) 250 MG tablet       Follow up plan: Return if symptoms worsen or fail to improve.

## 2019-04-22 ENCOUNTER — Other Ambulatory Visit: Payer: Self-pay

## 2019-04-22 NOTE — Telephone Encounter (Signed)
Patient will need appointment in office with a provider since she has not been seen since May.

## 2019-04-22 NOTE — Telephone Encounter (Signed)
Letter generated and sent to patient.  

## 2019-04-22 NOTE — Telephone Encounter (Signed)
Patient last seen 10/18/18.

## 2019-04-26 ENCOUNTER — Other Ambulatory Visit: Payer: Self-pay | Admitting: Family Medicine

## 2019-04-26 NOTE — Telephone Encounter (Signed)
Requested medication (s) are due for refill today: yes  Requested medication (s) are on the active medication list: yes  Last refill:  04/19/2018  Future visit scheduled: yes  Notes to clinic:  Review for refills   Requested Prescriptions  Pending Prescriptions Disp Refills   metoprolol succinate (TOPROL-XL) 50 MG 24 hr tablet 90 tablet 4    Sig: TAKE ONE TABLET BY MOUTH EVERY DAY WITH A MEAL     Cardiovascular:  Beta Blockers Failed - 04/26/2019  9:49 AM      Failed - Valid encounter within last 6 months    Recent Outpatient Visits          2 weeks ago Acute maxillary sinusitis, recurrence not specified   [facility] Volney American, PA-C   6 months ago Acute sinusitis, recurrence not specified, unspecified location   [facility], Jeannette How, [place]   10 months ago Sciatica of right side   [facility], [place], [place]   11 months ago Rib pain on right side   [facility] Volney American, [place]   11 months ago BV (bacterial vaginosis)   [facility], [place], [place]      Future Appointments            In 1 week [facility], [facility]            Passed - Last BP in normal range    BP Readings from Last 1 Encounters:  06/25/18 129/69         Passed - Last Heart Rate in normal range    Pulse Readings from Last 1 Encounters:  06/25/18 74          clonazePAM (KLONOPIN) 0.5 MG tablet 50 tablet 5    Sig: Take 1 tablet (0.5 mg total) by mouth 2 (two) times daily as needed. for anxiety     Not Delegated - Psychiatry:  Anxiolytics/Hypnotics Failed - 04/26/2019  9:49 AM      Failed - This refill cannot be delegated      Failed - Urine Drug Screen completed in last 360 days.      Failed - Valid encounter within last 6 months    Recent Outpatient Visits          2 weeks ago Acute maxillary sinusitis, recurrence not specified   [facility] Volney American, [place]   6 months ago Acute sinusitis, recurrence not specified, unspecified location   [facility], Jeannette How, MD   10 months ago Sciatica of right side   [facility] Merrie Roof [place], [place]   11 months ago Rib pain on right side   [facility] Volney American, [place]   11 months ago BV (bacterial vaginosis)   [facility], L[place] Arg[place], [place]      Future Appointments            In 1 week [place], [facility]             benazepril (LOTENSIN) 40 MG tablet 90 tablet 4    Sig: Take 1 tablet (40 mg total) by mouth daily.     Cardiovascular:  ACE Inhibitors Failed - 04/26/2019  9:49 AM      Failed - Cr in normal range and within 180 days    Creatinine, Ser  Date Value Ref Range Status  06/25/2018 0.89 0.44 - 1.00 mg/dL Final  Failed - K in normal range and within 180 days    Potassium  Date Value Ref Range Status  06/25/2018 3.7 3.5 - 5.1 mmol/L Final         Failed - Valid encounter within last 6 months    Recent Outpatient Visits          2 weeks ago Acute maxillary sinusitis, recurrence not specified   [facility] Volney American, PA-C   6 months ago Acute sinusitis, recurrence not specified, unspecified location   [facility], Jeannette How, MD   10 months ago Sciatica of right side   [facility], [place], [place]   11 months ago Rib pain on right side   [place], [place], [place]   11 months ago BV (bacterial vaginosis)   [facility], [place] Arg[place], [place]      Future Appointments            In 1 week [facility], [place] - Patient is not pregnant      Passed - Last BP in normal range    BP Readings from Last 1 Encounters:  06/25/18 129/69          atorvastatin (LIPITOR) 40 MG tablet 90 tablet 4    Sig: Take 1  tablet (40 mg total) by mouth daily.     Cardiovascular:  Antilipid - Statins Failed - 04/26/2019  9:49 AM      Failed - Total Cholesterol in normal range and within 360 days    Cholesterol, Total  Date Value Ref Range Status  04/19/2018 150 100 - 199 mg/dL Final   Cholesterol Piccolo, Waived  Date Value Ref Range Status  12/11/2017 163 <200 mg/dL Final    Comment:                            Desirable                <200                         Borderline High      200- 239                         High                     >239          Failed - LDL in normal range and within 360 days    LDL Calculated  Date Value Ref Range Status  04/19/2018 69 0 - 99 mg/dL Final         Failed - HDL in normal range and within 360 days    HDL  Date Value Ref Range Status  04/19/2018 58 >39 mg/dL Final         Failed - Triglycerides in normal range and within 360 days    Triglycerides  Date Value Ref Range Status  04/19/2018 116 0 - 149 mg/dL Final   Triglycerides Piccolo,Waived  Date Value Ref Range Status  12/11/2017 167 (H) <150 mg/dL Final    Comment:                            Normal                   <  150                         Borderline High     150 - 199                         High                200 - 499                         Very High                >499          Passed - Patient is not pregnant      Passed - Valid encounter within last 12 months    Recent Outpatient Visits          2 weeks ago Acute maxillary sinusitis, recurrence not specified   [facility] Volney American, [place]   6 months ago Acute sinusitis, recurrence not specified, unspecified location   [facility], Jeannette How, [place]   10 months ago Sciatica of right side   [place], [place]   11 months ago Rib pain on right side   [facility] Volney American, [place]   11 months ago BV (bacterial vaginosis)    [facility], [place] Arg[place], [place]      Future Appointments            In 1 week [facility], [place]

## 2019-04-26 NOTE — Telephone Encounter (Signed)
Medication Refill - Medication: Atorvastatin 40 mg, Metoprolol ER 50 mg, Lotensin 40, Clonazepam 0.5   Has the patient contacted their pharmacy? Yes.   (Agent: If no, request that the patient contact the pharmacy for the refill.) (Agent: If yes, when and what did the pharmacy advise?)  Preferred Pharmacy (with phone number or street name): [place]  Agent: Please be advised that RX refills may take up to 3 business days. We ask that you follow-up with your pharmacy.

## 2019-04-29 NOTE — Telephone Encounter (Signed)
Routing to provider  

## 2019-04-29 NOTE — Telephone Encounter (Signed)
Needs appointment

## 2019-04-30 ENCOUNTER — Encounter: Payer: Self-pay | Admitting: Family Medicine

## 2019-04-30 NOTE — Telephone Encounter (Signed)
Letter printed to mail to pt.

## 2019-05-02 MED ORDER — CLONAZEPAM 0.5 MG PO TABS: 0.5000 mg | ORAL_TABLET | Freq: Two times a day (BID) | ORAL | 0 refills | Status: DC | PRN

## 2019-05-02 MED ORDER — BENAZEPRIL HCL 40 MG PO TABS: 40.0000 mg | ORAL_TABLET | Freq: Every day | ORAL | 0 refills | Status: DC

## 2019-05-02 MED ORDER — ATORVASTATIN CALCIUM 40 MG PO TABS: 40.0000 mg | ORAL_TABLET | Freq: Every day | ORAL | 0 refills | Status: DC

## 2019-05-02 MED ORDER — METOPROLOL SUCCINATE ER 50 MG PO TB24: ORAL_TABLET | ORAL | 0 refills | Status: DC

## 2019-05-06 ENCOUNTER — Ambulatory Visit: Payer: Medicare Other

## 2019-06-01 ENCOUNTER — Other Ambulatory Visit: Payer: Self-pay | Admitting: Family Medicine

## 2019-06-02 NOTE — Telephone Encounter (Signed)
Requested medication (s) are due for refill today: yes  Requested medication (s) are on the active medication list:yes  Last refill:  05/02/2019  Future visit scheduled: no  Notes to clinic:  no valid encounter within last 6 months Review for refill  Requested Prescriptions  Pending Prescriptions Disp Refills   benazepril (LOTENSIN) 40 MG tablet [Pharmacy Med Name: benazepril 40 mg tablet] 30 tablet 0    Sig: TAKE ONE TABLET BY MOUTH ONCE DAILY      Cardiovascular:  ACE Inhibitors Failed - 06/01/2019 10:20 AM      Failed - Cr in normal range and within 180 days    Creatinine, Ser  Date Value Ref Range Status  06/25/2018 0.89 0.44 - 1.00 mg/dL Final          Failed - K in normal range and within 180 days    Potassium  Date Value Ref Range Status  06/25/2018 3.7 3.5 - 5.1 mmol/L Final          Failed - Valid encounter within last 6 months    Recent Outpatient Visits           1 month ago Acute maxillary sinusitis, recurrence not specified   [facility] Volney American, PA-C   7 months ago Acute sinusitis, recurrence not specified, unspecified location   [facility], Jeannette How, MD   11 months ago Sciatica of right side   [facility], [place], [place]   1 year ago Rib pain on right side   [place], [place], [place]   1 year ago BV (bacterial vaginosis)   [place], [place], [place]              Passed - Patient is not pregnant      Passed - Last BP in normal range    BP Readings from Last 1 Encounters:  06/25/18 129/69            metoprolol succinate (TOPROL-XL) 50 MG 24 hr tablet [Pharmacy Med Name: metoprolol succinate ER 50 mg tablet,extended release 24 hr] 30 tablet 0    Sig: TAKE ONE TABLET BY MOUTH ONCE DAILY WITH A MEAL      Cardiovascular:  Beta Blockers Failed - 06/01/2019 10:20 AM      Failed - Valid encounter within last 6 months   Recent Outpatient Visits           1 month ago Acute maxillary sinusitis, recurrence not specified   [facility] Volney American, PA-C   7 months ago Acute sinusitis, recurrence not specified, unspecified location   [facility], Jeannette How, MD   11 months ago Sciatica of right side   [facility], [place], [place]   1 year ago Rib pain on right side   [facility] Volney American, [place]   1 year ago BV (bacterial vaginosis)   [place], [place], [place]              Passed - Last BP in normal range    BP Readings from Last 1 Encounters:  06/25/18 129/69          Passed - Last Heart Rate in normal range    Pulse Readings from Last 1 Encounters:  06/25/18 74            atorvastatin (LIPITOR) 40 MG tablet [Pharmacy Med Name: atorvastatin 40 mg  tablet] 30 tablet 0    Sig: TAKE ONE TABLET BY MOUTH ONCE DAILY      Cardiovascular:  Antilipid - Statins Failed - 06/01/2019 10:20 AM      Failed - Total Cholesterol in normal range and within 360 days    Cholesterol, Total  Date Value Ref Range Status  04/19/2018 150 100 - 199 mg/dL Final   Cholesterol Piccolo, Waived  Date Value Ref Range Status  12/11/2017 163 <200 mg/dL Final    Comment:                            Desirable                <200                         Borderline High      200- 239                         High                     >239           Failed - LDL in normal range and within 360 days    LDL Calculated  Date Value Ref Range Status  04/19/2018 69 0 - 99 mg/dL Final          Failed - HDL in normal range and within 360 days    HDL  Date Value Ref Range Status  04/19/2018 58 >39 mg/dL Final          Failed - Triglycerides in normal range and within 360 days    Triglycerides  Date Value Ref Range Status  04/19/2018 116 0 - 149 mg/dL Final   Triglycerides Piccolo,Waived  Date Value Ref  Range Status  12/11/2017 167 (H) <150 mg/dL Final    Comment:                            Normal                   <150                         Borderline High     150 - 199                         High                200 - 499                         Very High                >499           Passed - Patient is not pregnant      Passed - Valid encounter within last 12 months    Recent Outpatient Visits           1 month ago Acute maxillary sinusitis, recurrence not specified   [facility] Volney American, PA-C   7 months ago Acute sinusitis, recurrence not specified, unspecified location   [facility], Jeannette How, MD   11 months ago Sciatica of right side  [place], [place], [place]   1 year ago Rib pain on right side   [place], [place], [place]   1 year ago BV (bacterial vaginosis)   [place], [place], [place]                clonazePAM (KLONOPIN) 0.5 MG tablet [Pharmacy Med Name: clonazepam 0.5 mg tablet] 50 tablet 0    Sig: TAKE ONE TABLET BY MOUTH TWICE DAILY AS NEEDED FOR ANXIETY      Not Delegated - Psychiatry:  Anxiolytics/Hypnotics Failed - 06/01/2019 10:20 AM      Failed - This refill cannot be delegated      Failed - Urine Drug Screen completed in last 360 days.      Failed - Valid encounter within last 6 months    Recent Outpatient Visits           1 month ago Acute maxillary sinusitis, recurrence not specified   [facility] Volney American, [place]   7 months ago Acute sinusitis, recurrence not specified, unspecified location   [facility], Jeannette How, MD   11 months ago Sciatica of right side   [facility], [place], [place]   1 year ago Rib pain on right side   [facility] Volney American, [place]   1 year ago BV (bacterial vaginosis)   [place], [place], [place]

## 2019-06-03 ENCOUNTER — Other Ambulatory Visit: Payer: Self-pay

## 2019-06-03 NOTE — Telephone Encounter (Signed)
Patient has an appointment tomorrow 06/04/19 with Merrie Roof, PA-C

## 2019-06-03 NOTE — Telephone Encounter (Signed)
Routing to provider  

## 2019-06-03 NOTE — Telephone Encounter (Signed)
[place] faxed a refill request on sertraline 100 mg tab

## 2019-06-04 ENCOUNTER — Other Ambulatory Visit: Payer: Self-pay

## 2019-06-04 ENCOUNTER — Encounter: Payer: Self-pay | Admitting: Family Medicine

## 2019-06-04 ENCOUNTER — Ambulatory Visit (INDEPENDENT_AMBULATORY_CARE_PROVIDER_SITE_OTHER): Payer: Medicare Other | Admitting: Family Medicine

## 2019-06-04 VITALS — BP 189/108 | HR 74 | Ht 64.0 in

## 2019-06-04 DIAGNOSIS — F419 Anxiety disorder, unspecified: Secondary | ICD-10-CM

## 2019-06-04 DIAGNOSIS — I10 Essential (primary) hypertension: Secondary | ICD-10-CM

## 2019-06-04 DIAGNOSIS — J309 Allergic rhinitis, unspecified: Secondary | ICD-10-CM | POA: Diagnosis not present

## 2019-06-04 DIAGNOSIS — J45909 Unspecified asthma, uncomplicated: Secondary | ICD-10-CM | POA: Diagnosis not present

## 2019-06-04 DIAGNOSIS — K219 Gastro-esophageal reflux disease without esophagitis: Secondary | ICD-10-CM | POA: Diagnosis not present

## 2019-06-04 DIAGNOSIS — F339 Major depressive disorder, recurrent, unspecified: Secondary | ICD-10-CM | POA: Diagnosis not present

## 2019-06-04 DIAGNOSIS — E782 Mixed hyperlipidemia: Secondary | ICD-10-CM

## 2019-06-04 MED ORDER — BENAZEPRIL HCL 40 MG PO TABS: 40.0000 mg | ORAL_TABLET | Freq: Every day | ORAL | 1 refills | Status: DC

## 2019-06-04 MED ORDER — MELOXICAM 7.5 MG PO TABS: 7.5000 mg | ORAL_TABLET | Freq: Every day | ORAL | 3 refills | Status: DC

## 2019-06-04 MED ORDER — MONTELUKAST SODIUM 10 MG PO TABS: 10.0000 mg | ORAL_TABLET | Freq: Every day | ORAL | 3 refills | Status: DC

## 2019-06-04 MED ORDER — ATORVASTATIN CALCIUM 40 MG PO TABS: 40.0000 mg | ORAL_TABLET | Freq: Every day | ORAL | 1 refills | Status: DC

## 2019-06-04 MED ORDER — METOPROLOL SUCCINATE ER 50 MG PO TB24: ORAL_TABLET | ORAL | 1 refills | Status: DC

## 2019-06-04 MED ORDER — TRIAMCINOLONE ACETONIDE 55 MCG/ACT NA AERO: 2.0000 | INHALATION_SPRAY | Freq: Every day | NASAL | 12 refills | Status: DC

## 2019-06-04 MED ORDER — SERTRALINE HCL 100 MG PO TABS: 100.0000 mg | ORAL_TABLET | Freq: Every day | ORAL | 3 refills | Status: DC

## 2019-06-04 MED ORDER — CLONAZEPAM 0.5 MG PO TABS: 0.5000 mg | ORAL_TABLET | Freq: Two times a day (BID) | ORAL | 0 refills | Status: DC | PRN

## 2019-06-04 MED ORDER — ALBUTEROL SULFATE HFA 108 (90 BASE) MCG/ACT IN AERS: 2.0000 | INHALATION_SPRAY | Freq: Four times a day (QID) | RESPIRATORY_TRACT | 2 refills | Status: DC | PRN

## 2019-06-04 NOTE — Progress Notes (Signed)
BP (!) 189/108   Pulse 74   Ht 5\' 4"  (1.626 m)   BMI 20.08 kg/m    Subjective:    Patient ID: Caitlyn May, female    DOB: April 02, 1956, 64 y.o.   MRN: [id number]  HPI: Caitlyn May is a 64 y.o. female  Chief Complaint  Patient presents with  . Hypertension  . Medication Refill  . Anxiety    . This visit was completed via WebEx due to the restrictions of the COVID-19 pandemic. All issues as above were discussed and addressed. Physical exam was done as above through visual confirmation on WebEx. If it was felt that the patient should be evaluated in the office, they were directed there. The patient verbally consented to this visit. . Location of the patient: home . Location of the provider: home . Those involved with this call:  . Provider: Merrie Roof, PA-C . CMA: Lesle Chris, [place] . Front Desk/Registration: Don Perking  . Time spent on call: 25 minutes with patient face to face via video conference. More than 50% of this time was spent in counseling and coordination of care. 10 minutes total spent in review of patient's record and preparation of their chart. I verified patient identity using two factors (patient name and date of birth). Patient consents verbally to being seen via telemedicine visit today.   Presenting today for 6 month f/u chronic conditions.   Has been of her medications a few days. Home BPs when taking her medications consistently have been running 130s/80s per patient. Has not been feeling particularly well since being out of her medicines so knew her BP was up. Otherwise states she feels fairly well overall.   HLD - Off her medications for a few days, but typically taking lipitor without side effects. Denies CP, SOB, claudication, myalgias. Tries to eat well, does not exercise.   Dealing with lots of grief issues since some major losses of primary family members the past few years. Taking zoloft and klonopin daily. Some days having to take the  klonopin twice in a day but tries to make it with just once daily. No side effects reported and has been on this regimen for years. Denies SI/HI, severe mood swings. Declines any counseling.   Asthma and allergies - some wheezing and chest tightness but has been out of her inhalers and medicines. Typically under fairly good control unless acutely sick.   GAD 7 : Generalized Anxiety Score 06/04/2019 09/14/2016  Nervous, Anxious, on Edge 2 2  Control/stop worrying 3 3  Worry too much - different things 3 3  Trouble relaxing 3 3  Restless 0 3  Easily annoyed or irritable 3 3  Afraid - awful might happen 2 1  Total GAD 7 Score 16 18  Anxiety Difficulty Somewhat difficult -   Depression screen [facility] 2/9 06/04/2019 09/10/2018 05/03/2018  Decreased Interest 3 1 3   Down, Depressed, Hopeless 2 1 3   PHQ - 2 Score 5 2 6   Altered sleeping 1 0 1  Tired, decreased energy 3 3 3   Change in appetite 1 0 1  Feeling bad or failure about yourself  1 0 0  Trouble concentrating 1 0 2  Moving slowly or fidgety/restless 2 0 0  Suicidal thoughts 0 0 0  PHQ-9 Score 14 5 13   Difficult doing work/chores Somewhat difficult Not difficult at all Somewhat difficult  Some recent data might be hidden   Relevant past medical, surgical, family and social history reviewed and updated  as indicated. Interim medical history since our last visit reviewed. Allergies and medications reviewed and updated.  Review of Systems  Per HPI unless specifically indicated above     Objective:    BP (!) 189/108   Pulse 74   Ht 5\' 4"  (1.626 m)   BMI 20.08 kg/m   Wt Readings from Last 3 Encounters:  06/25/18 117 lb (53.1 kg)  06/21/18 117 lb (53.1 kg)  05/28/18 123 lb 4.8 oz (55.9 kg)    Physical Exam Vitals and nursing note reviewed.  Constitutional:      General: She is not in acute distress.    Appearance: Normal appearance.  HENT:     Head: Atraumatic.     Right Ear: External ear normal.     Left Ear: External ear  normal.     Nose: Nose normal. No congestion.     Mouth/Throat:     Mouth: Mucous membranes are moist.     Pharynx: Oropharynx is clear. No posterior oropharyngeal erythema.  Eyes:     Extraocular Movements: Extraocular movements intact.     Conjunctiva/sclera: Conjunctivae normal.  Cardiovascular:     Comments: Unable to assess via virtual visit Pulmonary:     Effort: Pulmonary effort is normal. No respiratory distress.  Musculoskeletal:        General: Normal range of motion.     Cervical back: Normal range of motion.  Skin:    General: Skin is dry.     Findings: No erythema.  Neurological:     Mental Status: She is alert and oriented to person, place, and time.  Psychiatric:        Mood and Affect: Mood normal.        Thought Content: Thought content normal.        Judgment: Judgment normal.     Results for orders placed or performed during the hospital encounter of 06/25/18  CBC  Result Value Ref Range   WBC 7.6 4.0 - 10.5 K/uL   RBC 4.55 3.87 - 5.11 MIL/uL   Hemoglobin 14.7 12.0 - 15.0 g/dL   HCT 44.6 36.0 - 46.0 %   MCV 98.0 80.0 - 100.0 fL   MCH 32.3 26.0 - 34.0 pg   MCHC 33.0 30.0 - 36.0 g/dL   RDW 11.7 11.5 - 15.5 %   Platelets 265 150 - 400 K/uL   nRBC 0.0 0.0 - 0.2 %  Urinalysis, Complete w Microscopic  Result Value Ref Range   Color, Urine STRAW (A) YELLOW   APPearance CLEAR (A) CLEAR   Specific Gravity, Urine 1.005 1.005 - 1.030   pH 5.0 5.0 - 8.0   Glucose, UA NEGATIVE NEGATIVE mg/dL   Hgb urine dipstick SMALL (A) NEGATIVE   Bilirubin Urine NEGATIVE NEGATIVE   Ketones, ur NEGATIVE NEGATIVE mg/dL   Protein, ur NEGATIVE NEGATIVE mg/dL   Nitrite NEGATIVE NEGATIVE   Leukocytes, UA NEGATIVE NEGATIVE   RBC / HPF 0-5 0 - 5 RBC/hpf   WBC, UA 0-5 0 - 5 WBC/hpf   Bacteria, UA NONE SEEN NONE SEEN   Squamous Epithelial / LPF 0-5 0 - 5  Comprehensive metabolic panel  Result Value Ref Range   Sodium 138 135 - 145 mmol/L   Potassium 3.7 3.5 - 5.1 mmol/L    Chloride 108 98 - 111 mmol/L   CO2 23 22 - 32 mmol/L   Glucose, Bld 121 (H) 70 - 99 mg/dL   BUN 14 8 - 23 mg/dL   Creatinine,  Ser 0.89 0.44 - 1.00 mg/dL   Calcium 8.8 (L) 8.9 - 10.3 mg/dL   Total Protein 7.2 6.5 - 8.1 g/dL   Albumin 4.1 3.5 - 5.0 g/dL   AST 17 15 - 41 U/L   ALT 16 0 - 44 U/L   Alkaline Phosphatase 61 38 - 126 U/L   Total Bilirubin 0.5 0.3 - 1.2 mg/dL   GFR calc non Af Amer >60 >60 mL/min   GFR calc Af Amer >60 >60 mL/min   Anion gap 7 5 - 15  Troponin I - ONCE - STAT  Result Value Ref Range   Troponin I <0.03 <0.03 ng/mL      Assessment & Plan:   Problem List Items Addressed This Visit      Cardiovascular and Mediastinum   Hypertension - Primary    Significantly elevated but has been out of medication for several days. Will restart medications and f/u in 2-4 weeks in office for recheck.       Relevant Medications   atorvastatin (LIPITOR) 40 MG tablet   benazepril (LOTENSIN) 40 MG tablet   metoprolol succinate (TOPROL-XL) 50 MG 24 hr tablet     Respiratory   Asthma due to environmental allergies    Exacerbated currently but ran out of inhalers and singulair. Will restart regimen and recheck in person in 1 month      Relevant Medications   albuterol (VENTOLIN HFA) 108 (90 Base) MCG/ACT inhaler   montelukast (SINGULAIR) 10 MG tablet   Allergic rhinitis    Restart medications, supportive care reviewed.         Digestive   Acid reflux    Stable and well controlled, continue current regimen        Other   Depression, recurrent ([place])    Exacerbated by grief, but not interested in making any changes or receiving counseling. Continue current regimen and try to limit klonopin to once daily as needed       Relevant Medications   sertraline (ZOLOFT) 100 MG tablet   Hyperlipidemia    Recheck lipids and adjust as needed.       Relevant Medications   atorvastatin (LIPITOR) 40 MG tablet   benazepril (LOTENSIN) 40 MG tablet   metoprolol succinate  (TOPROL-XL) 50 MG 24 hr tablet   Anxiety    Exacerbated but not interested in changing regimen. Continue current regimen      Relevant Medications   sertraline (ZOLOFT) 100 MG tablet       Follow up plan: Return in about 4 weeks (around 07/02/2019) for in person follow up.

## 2019-06-06 ENCOUNTER — Telehealth: Payer: Self-pay | Admitting: Family Medicine

## 2019-06-06 NOTE — Telephone Encounter (Signed)
Pt is calling and said the pharm told her that her insurance will not covered triamcinolone nasal inhaler and pt would like someone to call the pharm she does not know if med needs PA . [place]

## 2019-06-07 NOTE — Telephone Encounter (Signed)
PA for Triamcinolone (Nasacort) initiated and submitted via Cover My Meds. Key: [id number]

## 2019-06-09 NOTE — Assessment & Plan Note (Signed)
Exacerbated but not interested in changing regimen. Continue current regimen

## 2019-06-09 NOTE — Assessment & Plan Note (Signed)
Exacerbated currently but ran out of inhalers and singulair. Will restart regimen and recheck in person in 1 month

## 2019-06-09 NOTE — Assessment & Plan Note (Signed)
Recheck lipids and adjust as needed.

## 2019-06-09 NOTE — Assessment & Plan Note (Addendum)
Exacerbated by grief, but not interested in making any changes or receiving counseling. Continue current regimen and try to limit klonopin to once daily as needed

## 2019-06-09 NOTE — Assessment & Plan Note (Signed)
Restart medications, supportive care reviewed.

## 2019-06-09 NOTE — Assessment & Plan Note (Signed)
Significantly elevated but has been out of medication for several days. Will restart medications and f/u in 2-4 weeks in office for recheck.

## 2019-06-09 NOTE — Assessment & Plan Note (Signed)
Stable and well controlled, continue current regimen 

## 2019-06-10 ENCOUNTER — Ambulatory Visit (INDEPENDENT_AMBULATORY_CARE_PROVIDER_SITE_OTHER): Payer: Medicare Other

## 2019-06-10 ENCOUNTER — Other Ambulatory Visit: Payer: Self-pay

## 2019-06-10 ENCOUNTER — Ambulatory Visit (INDEPENDENT_AMBULATORY_CARE_PROVIDER_SITE_OTHER): Payer: Medicare Other | Admitting: Family Medicine

## 2019-06-10 VITALS — BP 166/93 | HR 70 | Temp 97.7°F | Resp 16 | Ht 64.0 in | Wt 106.6 lb

## 2019-06-10 VITALS — BP 124/68 | HR 70 | Temp 97.7°F | Ht 64.0 in | Wt 106.0 lb

## 2019-06-10 DIAGNOSIS — E782 Mixed hyperlipidemia: Secondary | ICD-10-CM | POA: Diagnosis not present

## 2019-06-10 DIAGNOSIS — K219 Gastro-esophageal reflux disease without esophagitis: Secondary | ICD-10-CM | POA: Diagnosis not present

## 2019-06-10 DIAGNOSIS — F339 Major depressive disorder, recurrent, unspecified: Secondary | ICD-10-CM

## 2019-06-10 DIAGNOSIS — R350 Frequency of micturition: Secondary | ICD-10-CM | POA: Diagnosis not present

## 2019-06-10 DIAGNOSIS — Z0000 Encounter for general adult medical examination without abnormal findings: Secondary | ICD-10-CM

## 2019-06-10 DIAGNOSIS — N898 Other specified noninflammatory disorders of vagina: Secondary | ICD-10-CM | POA: Diagnosis not present

## 2019-06-10 DIAGNOSIS — J45909 Unspecified asthma, uncomplicated: Secondary | ICD-10-CM

## 2019-06-10 DIAGNOSIS — I10 Essential (primary) hypertension: Secondary | ICD-10-CM

## 2019-06-10 DIAGNOSIS — J309 Allergic rhinitis, unspecified: Secondary | ICD-10-CM | POA: Diagnosis not present

## 2019-06-10 DIAGNOSIS — R634 Abnormal weight loss: Secondary | ICD-10-CM | POA: Diagnosis not present

## 2019-06-10 DIAGNOSIS — F419 Anxiety disorder, unspecified: Secondary | ICD-10-CM | POA: Diagnosis not present

## 2019-06-10 DIAGNOSIS — Z23 Encounter for immunization: Secondary | ICD-10-CM | POA: Diagnosis not present

## 2019-06-10 MED ORDER — MEGESTROL ACETATE 20 MG PO TABS: 20.0000 mg | ORAL_TABLET | Freq: Two times a day (BID) | ORAL | 0 refills | Status: DC

## 2019-06-10 MED ORDER — AMLODIPINE BESYLATE 5 MG PO TABS: 5.0000 mg | ORAL_TABLET | Freq: Every day | ORAL | 0 refills | Status: DC

## 2019-06-10 NOTE — [place] (Signed)
Subjective:   Caitlyn May is a 64 y.o. female who presents for Medicare Annual (Subsequent) preventive examination.  Review of Systems:   Cardiac Risk Factors include: advanced age (>74men, >71 women);dyslipidemia;hypertension;smoking/ tobacco exposure     Objective:     Vitals: BP (!) 166/93 (BP Location: Left Arm, Patient Position: Sitting, Cuff Size: Normal)   Pulse 70   Temp 97.7 F (36.5 C) (Oral)   Resp 16   Ht 5\' 4"  (1.626 m)   Wt 106 lb 9.6 oz (48.4 kg)   SpO2 96%   BMI 18.30 kg/m   Body mass index is 18.3 kg/m.  Advanced Directives 06/10/2019 06/25/2018 05/03/2018 04/14/2017 11/29/2016  Does Patient Have a Medical Advance Directive? No No No No No  Would patient like information on creating a medical advance directive? Yes (MAU/Ambulatory/Procedural Areas - Information given) - Yes (MAU/Ambulatory/Procedural Areas - Information given) Yes (MAU/Ambulatory/Procedural Areas - Information given) -    Tobacco Social History   Tobacco Use  Smoking Status Current Every Day Smoker  . Packs/day: 2.00  . Types: Cigarettes  Smokeless Tobacco Never Used     Ready to quit: No Counseling given: Yes   Clinical Intake:  Pre-visit preparation completed: Yes  Pain : 0-10 Pain Score: 6  Pain Type: Chronic pain Pain Location: Generalized     Nutritional Status: BMI <19  Underweight Nutritional Risks: None Diabetes: No  How often do you need to have someone help you when you read instructions, pamphlets, or other written materials from your doctor or pharmacy?: 1 - Never  Interpreter Needed?: No  Information entered by :: Kimyatta Lecy,LPN  Past Medical History:  Diagnosis Date  . Chronic kidney disease   . Depression   . Failed back syndrome, lumbar   . Hyperlipidemia   . Hypertension   . Osteoporosis   . Weight loss    Past Surgical History:  Procedure Laterality Date  . ABDOMINAL HYSTERECTOMY    . CESAREAN SECTION    . MOUTH SURGERY    . SPINE  SURGERY     Family History  Problem Relation Age of Onset  . Hypertension Mother   . Dementia Mother   . Cancer Father   . Cancer Maternal Aunt        breast  . Diabetes Neg Hx   . Heart disease Neg Hx   . Stroke Neg Hx   . COPD Neg Hx    Social History   Socioeconomic History  . Marital status: Married    Spouse name: Not on file  . Number of children: Not on file  . Years of education: 53  . Highest education level: 10th grade  Occupational History  . Not on file  Tobacco Use  . Smoking status: Current Every Day Smoker    Packs/day: 2.00    Types: Cigarettes  . Smokeless tobacco: Never Used  Substance and Sexual Activity  . Alcohol use: No  . Drug use: Not Currently    Types: Marijuana    Comment: last week estimated 05/10/2019  . Sexual activity: Not on file  Other Topics Concern  . Not on file  Social History Narrative  . Not on file   Social Determinants of Health   Financial Resource Strain:   . Difficulty of Paying Living Expenses: Not on file  Food Insecurity:   . Worried About Charity fundraiser in the Last Year: Not on file  . Ran Out of Food in the Last Year: Not  on file  Transportation Needs:   . Lack of Transportation (Medical): Not on file  . Lack of Transportation (Non-Medical): Not on file  Physical Activity:   . Days of Exercise per Week: Not on file  . Minutes of Exercise per Session: Not on file  Stress:   . Feeling of Stress : Not on file  Social Connections:   . Frequency of Communication with Friends and Family: Not on file  . Frequency of Social Gatherings with Friends and Family: Not on file  . Attends Religious Services: Not on file  . Active Member of Clubs or Organizations: Not on file  . Attends Archivist Meetings: Not on file  . Marital Status: Not on file    Outpatient Encounter Medications as of 06/10/2019  Medication Sig  . albuterol (VENTOLIN HFA) 108 (90 Base) MCG/ACT inhaler Inhale 2 puffs into the lungs  every 6 (six) hours as needed for wheezing or shortness of breath.  Marland Kitchen atorvastatin (LIPITOR) 40 MG tablet Take 1 tablet (40 mg total) by mouth daily.  . benazepril (LOTENSIN) 40 MG tablet Take 1 tablet (40 mg total) by mouth daily.  . cetirizine (ZYRTEC) 10 MG tablet Take 1 tablet (10 mg total) by mouth daily.  . clonazePAM (KLONOPIN) 0.5 MG tablet Take 1 tablet (0.5 mg total) by mouth 2 (two) times daily as needed. for anxiety  . cyclobenzaprine (FLEXERIL) 5 MG tablet TAKE ONE TABLET BY MOUTH ONCE DAILY AS NEEDED FOR MUSCLE SPASMS  . meloxicam (MOBIC) 7.5 MG tablet Take 1 tablet (7.5 mg total) by mouth daily.  . metoprolol succinate (TOPROL-XL) 50 MG 24 hr tablet TAKE ONE TABLET BY MOUTH EVERY DAY WITH A MEAL  . montelukast (SINGULAIR) 10 MG tablet Take 1 tablet (10 mg total) by mouth at bedtime.  . naproxen (NAPROSYN) 500 MG tablet Take 1 tablet (500 mg total) by mouth 2 (two) times daily with a meal.  . ranitidine (ZANTAC) 75 MG tablet Take 1 tablet (75 mg total) by mouth 2 (two) times daily.  . sertraline (ZOLOFT) 100 MG tablet Take 1 tablet (100 mg total) by mouth daily.  Marland Kitchen triamcinolone (NASACORT AQ) 55 MCG/ACT AERO nasal inhaler Place 2 sprays into the nose daily.  Marland Kitchen gabapentin (NEURONTIN) 300 MG capsule Take 1 capsule (300 mg total) by mouth at bedtime as needed. (Patient not taking: Reported on 06/10/2019)   No facility-administered encounter medications on file as of 06/10/2019.    Activities of Daily Living In your present state of health, do you have any difficulty performing the following activities: 06/10/2019 06/04/2019  Hearing? N N  Vision? N N  Comment eyeglasses, [facility] -  Difficulty concentrating or making decisions? N N  Walking or climbing stairs? Y N  Dressing or bathing? N N  Doing errands, shopping? N N  Preparing Food and eating ? N -  Using the Toilet? N -  In the past six months, have you accidently leaked urine? N -  Do you have problems with loss of  bowel control? N -  Managing your Medications? N -  Managing your Finances? N -  Housekeeping or managing your Housekeeping? N -  Some recent data might be hidden    Patient Care Team: Guadalupe Maple, MD as PCP - General (Family Medicine)    Assessment:   This is a routine wellness examination for [place].  Exercise Activities and Dietary recommendations Current Exercise Habits: The patient does not participate in regular exercise at present,  Exercise limited by: None identified  Goals    . DIET - INCREASE WATER INTAKE     Recommend drinking at least 5-6 glasses of water a day     . Quit Smoking     Smoking cessation discussed       Fall Risk: Fall Risk  06/10/2019 05/03/2018 03/14/2018 07/18/2017 04/14/2017  Falls in the past year? 0 1 No Yes Yes  Number falls in past yr: 0 1 - 1 2 or more  Injury with Fall? 0 0 - Yes Yes  Risk Factor Category  - - - High Fall Risk -  Follow up - Education provided - - -    FALL RISK PREVENTION PERTAINING TO THE HOME:  Any stairs in or around the home? Yes  If so, are there any without handrails? No   Home free of loose throw rugs in walkways, pet beds, electrical cords, etc? Yes  Adequate lighting in your home to reduce risk of falls? Yes   ASSISTIVE DEVICES UTILIZED TO PREVENT FALLS:  Life alert? No  Use of a cane, walker or w/c? No  Grab bars in the bathroom? No  Shower chair or bench in shower? Yes  Elevated toilet seat or a handicapped toilet? No   DME ORDERS:  DME order needed?  No   TIMED UP AND GO:  Was the test performed? Yes .  Length of time to ambulate 10 feet: 8 sec.   GAIT:  Appearance of gait: Gait steady and fast  without the use of an assistive device.  Education: Fall risk prevention has been discussed.  Intervention(s) required? No   DME/home health order needed?  No    Depression Screen PHQ 2/9 Scores 06/10/2019 06/04/2019 09/10/2018 05/03/2018  PHQ - 2 Score 3 5 2 6   PHQ- 9 Score 6 14 5 13       Cognitive Function     6CIT Screen 06/10/2019 05/03/2018 04/14/2017  What Year? 0 points 0 points 0 points  What month? 0 points 0 points 0 points  What time? 0 points 0 points 0 points  Count back from 20 0 points 0 points 0 points  Months in reverse 0 points 2 points 0 points  Repeat phrase 0 points 2 points 0 points  Total Score 0 4 0    Immunization History  Administered Date(s) Administered  . Influenza, High Dose Seasonal PF 04/14/2017  . Influenza,inj,Quad PF,6+ Mos 03/31/2015, 02/10/2016, 03/30/2018  . Influenza-Unspecified 03/11/2014  . Pneumococcal-Unspecified 11/01/2007  . Td 11/01/2007    Qualifies for Shingles Vaccine? Yes  Zostavax completed n/a. Due for Shingrix. Education has been provided regarding the importance of this vaccine. Pt has been advised to call insurance company to determine out of pocket expense. Advised may also receive vaccine at local pharmacy or Health Dept. Verbalized acceptance and understanding.  Tdap: Although this vaccine is not a covered service during a Wellness Exam, does the patient still wish to receive this vaccine today?  No .  Education has been provided regarding the importance of this vaccine. Advised may receive this vaccine at local pharmacy or Health Dept. Aware to provide a copy of the vaccination record if obtained from local pharmacy or Health Dept. Verbalized acceptance and understanding.  Flu Vaccine: done today   Pneumococcal Vaccine: due in 08/2019  Screening Tests Health Maintenance  Topic Date Due  . MAMMOGRAM  09/20/2004  . COLONOSCOPY  09/20/2004  . INFLUENZA VACCINE  12/29/2018  . TETANUS/TDAP  06/09/2020 (Originally 10/31/2017)  .  HIV Screening  Completed  . Hepatitis C Screening  Addressed    Cancer Screenings:  Colorectal Screening: declined   Mammogram: declined   Bone Density: not indicated   Lung Cancer Screening: (Low Dose CT Chest recommended if Age 62-80 years, 30 pack-year currently smoking OR  have quit w/in 15years.) does qualify.   Lung Cancer Screening Referral: An Epic message has been sent to Burgess Estelle, RN (Oncology Nurse Navigator) regarding the possible need for this exam. Raquel Sarna will review the patient's chart to determine if the patient truly qualifies for the exam. If the patient qualifies, Raquel Sarna will order the Low Dose CT of the chest to facilitate the scheduling of this exam.  Additional Screening:  Hepatitis C Screening: does qualify; Completed 1997  Vision Screening: Recommended annual ophthalmology exams for early detection of glaucoma and other disorders of the eye. Is the patient up to date with their annual eye exam?  Yes  Who is the provider or what is the name of the office in which the pt attends annual eye exams? [facility]    Dental Screening: Recommended annual dental exams for proper oral hygiene  Community Resource Referral:  CRR required this visit?  No       Plan:  I have personally reviewed and addressed the Medicare Annual Wellness questionnaire and have noted the following in the patient's chart:  A. Medical and social history B. Use of alcohol, tobacco or illicit drugs  C. Current medications and supplements D. Functional ability and status E.  Nutritional status F.  Physical activity G. Advance directives H. List of other physicians I.  Hospitalizations, surgeries, and ER visits in previous 12 months J.  [place] such as hearing and vision if needed, cognitive and depression L. Referrals and appointments   In addition, I have reviewed and discussed with patient certain preventive protocols, quality metrics, and best practice recommendations. A written personalized care plan for preventive services as well as general preventive health recommendations were provided to patient.  Signed,    Bevelyn Ngo, LPN  [id number] Nurse Health Advisor   Nurse Notes: samples for ensure given, patient doesn't have appetite.

## 2019-06-10 NOTE — Patient Instructions (Signed)
Caitlyn May , Thank you for taking time to come for your Medicare Wellness Visit. I appreciate your ongoing commitment to your health goals. Please review the following plan we discussed and let me know if I can assist you in the future.   Screening recommendations/referrals: Colonoscopy: declined  Mammogram: declined  Bone Density: not indicated  Recommended yearly ophthalmology/optometry visit for glaucoma screening and checkup Recommended yearly dental visit for hygiene and checkup  Vaccinations: Influenza vaccine: done today  Pneumococcal vaccine: up to date  Tdap vaccine: due now  Shingles vaccine: shingrix eligible     Advanced directives: Advance directive discussed with you today. I have provided a copy for you to complete at home and have notarized. Once this is complete please bring a copy in to our office so we can scan it into your chart.  Conditions/risks identified: discussed chronic care management team   Next appointment: follow up in one year for your annual wellness visit   Preventive Care 40-64 Years, Female Preventive care refers to lifestyle choices and visits with your health care provider that can promote health and wellness. What does preventive care include?  A yearly physical exam. This is also called an annual well check.  Dental exams once or twice a year.  Routine eye exams. Ask your health care provider how often you should have your eyes checked.  Personal lifestyle choices, including:  Daily care of your teeth and gums.  Regular physical activity.  Eating a healthy diet.  Avoiding tobacco and drug use.  Limiting alcohol use.  Practicing safe sex.  Taking low-dose aspirin daily starting at age 76.  Taking vitamin and mineral supplements as recommended by your health care provider. What happens during an annual well check? The services and screenings done by your health care provider during your annual well check will depend on your  age, overall health, lifestyle risk factors, and family history of disease. Counseling  Your health care provider may ask you questions about your:  Alcohol use.  Tobacco use.  Drug use.  Emotional well-being.  Home and relationship well-being.  Sexual activity.  Eating habits.  Work and work Statistician.  Method of birth control.  Menstrual cycle.  Pregnancy history. Screening  You may have the following tests or measurements:  Height, weight, and BMI.  Blood pressure.  Lipid and cholesterol levels. These may be checked every 5 years, or more frequently if you are over 40 years old.  Skin check.  Lung cancer screening. You may have this screening every year starting at age 44 if you have a 30-pack-year history of smoking and currently smoke or have quit within the past 15 years.  Fecal occult blood test (FOBT) of the stool. You may have this test every year starting at age 45.  Flexible sigmoidoscopy or colonoscopy. You may have a sigmoidoscopy every 5 years or a colonoscopy every 10 years starting at age 67.  Hepatitis C blood test.  Hepatitis B blood test.  Sexually transmitted disease (STD) testing.  Diabetes screening. This is done by checking your blood sugar (glucose) after you have not eaten for a while (fasting). You may have this done every 1-3 years.  Mammogram. This may be done every 1-2 years. Talk to your health care provider about when you should start having regular mammograms. This may depend on whether you have a family history of breast cancer.  BRCA-related cancer screening. This may be done if you have a family history of breast, ovarian, tubal,  or peritoneal cancers.  Pelvic exam and Pap test. This may be done every 3 years starting at age 64. Starting at age 54, this may be done every 5 years if you have a Pap test in combination with an HPV test.  Bone density scan. This is done to screen for osteoporosis. You may have this scan if you  are at high risk for osteoporosis. Discuss your test results, treatment options, and if necessary, the need for more tests with your health care provider. Vaccines  Your health care provider may recommend certain vaccines, such as:  Influenza vaccine. This is recommended every year.  Tetanus, diphtheria, and acellular pertussis (Tdap, Td) vaccine. You may need a Td booster every 10 years.  Zoster vaccine. You may need this after age 39.  Pneumococcal 13-valent conjugate (PCV13) vaccine. You may need this if you have certain conditions and were not previously vaccinated.  Pneumococcal polysaccharide (PPSV23) vaccine. You may need one or two doses if you smoke cigarettes or if you have certain conditions. Talk to your health care provider about which screenings and vaccines you need and how often you need them. This information is not intended to replace advice given to you by your health care provider. Make sure you discuss any questions you have with your health care provider. Document Released: 06/12/2015 Document Revised: 02/03/2016 Document Reviewed: 03/17/2015 Elsevier Interactive Patient Education  2017 [employer] Prevention in the Home Falls can cause injuries. They can happen to people of all ages. There are many things you can do to make your home safe and to help prevent falls. What can I do on the outside of my home?  Regularly fix the edges of walkways and driveways and fix any cracks.  Remove anything that might make you trip as you walk through a door, such as a raised step or threshold.  Trim any bushes or trees on the path to your home.  Use bright outdoor lighting.  Clear any walking paths of anything that might make someone trip, such as rocks or tools.  Regularly check to see if handrails are loose or broken. Make sure that both sides of any steps have handrails.  Any raised decks and porches should have guardrails on the edges.  Have any leaves,  snow, or ice cleared regularly.  Use sand or salt on walking paths during winter.  Clean up any spills in your garage right away. This includes oil or grease spills. What can I do in the bathroom?  Use night lights.  Install grab bars by the toilet and in the tub and shower. Do not use towel bars as grab bars.  Use non-skid mats or decals in the tub or shower.  If you need to sit down in the shower, use a plastic, non-slip stool.  Keep the floor dry. Clean up any water that spills on the floor as soon as it happens.  Remove soap buildup in the tub or shower regularly.  Attach bath mats securely with double-sided non-slip rug tape.  Do not have throw rugs and other things on the floor that can make you trip. What can I do in the bedroom?  Use night lights.  Make sure that you have a light by your bed that is easy to reach.  Do not use any sheets or blankets that are too big for your bed. They should not hang down onto the floor.  Have a firm chair that has side arms. You  can use this for support while you get dressed.  Do not have throw rugs and other things on the floor that can make you trip. What can I do in the kitchen?  Clean up any spills right away.  Avoid walking on wet floors.  Keep items that you use a lot in easy-to-reach places.  If you need to reach something above you, use a strong step stool that has a grab bar.  Keep electrical cords out of the way.  Do not use floor polish or wax that makes floors slippery. If you must use wax, use non-skid floor wax.  Do not have throw rugs and other things on the floor that can make you trip. What can I do with my stairs?  Do not leave any items on the stairs.  Make sure that there are handrails on both sides of the stairs and use them. Fix handrails that are broken or loose. Make sure that handrails are as long as the stairways.  Check any carpeting to make sure that it is firmly attached to the stairs. Fix any  carpet that is loose or worn.  Avoid having throw rugs at the top or bottom of the stairs. If you do have throw rugs, attach them to the floor with carpet tape.  Make sure that you have a light switch at the top of the stairs and the bottom of the stairs. If you do not have them, ask someone to add them for you. What else can I do to help prevent falls?  Wear shoes that:  Do not have high heels.  Have rubber bottoms.  Are comfortable and fit you well.  Are closed at the toe. Do not wear sandals.  If you use a stepladder:  Make sure that it is fully opened. Do not climb a closed stepladder.  Make sure that both sides of the stepladder are locked into place.  Ask someone to hold it for you, if possible.  Clearly mark and make sure that you can see:  Any grab bars or handrails.  First and last steps.  Where the edge of each step is.  Use tools that help you move around (mobility aids) if they are needed. These include:  Canes.  Walkers.  Scooters.  Crutches.  Turn on the lights when you go into a dark area. Replace any light bulbs as soon as they burn out.  Set up your furniture so you have a clear path. Avoid moving your furniture around.  If any of your floors are uneven, fix them.  If there are any pets around you, be aware of where they are.  Review your medicines with your doctor. Some medicines can make you feel dizzy. This can increase your chance of falling. Ask your doctor what other things that you can do to help prevent falls. This information is not intended to replace advice given to you by your health care provider. Make sure you discuss any questions you have with your health care provider. Document Released: 03/12/2009 Document Revised: 10/22/2015 Document Reviewed: 06/20/2014 Elsevier Interactive Patient Education  2017 [employer].

## 2019-06-10 NOTE — Progress Notes (Signed)
BP 124/68   Pulse 70   Temp 97.7 F (36.5 C) (Oral)   Ht 5\' 4"  (1.626 m)   Wt 106 lb (48.1 kg)   BMI 18.19 kg/m    Subjective:    Patient ID: Caitlyn May, female    DOB: 05-23-1956, 64 y.o.   MRN: [id number]  HPI: Caitlyn May is a 64 y.o. female  Chief Complaint  Patient presents with  . Follow-up   Here today for f/u chronic conditions.   HTN - Home BPs running around 160/80s-90s. Had been out of medications for a while prior to recent virtual visit, now taking regimen faithfully without side effects. Denies CP, SOB, HAs, dizziness. Has been under a lot of stress and pain lately which she feels is contributing.   Has lost about 10 lb the past few months, no appetite. Has been more depressed for the holiday season too. Struggling to eat one meal per day most days. Recently bought some boost drinks and plans to start those.   Having DOE intermittently the past few months. This is not new for her and she's undergone significant cardiac workup for this in the past. Also has asthma that's been flared up lately.   Depression and anxiety - Exacerbated the past few years by grief, and now this year with COVID and isolation seems worse. Taking klonopin typically once or twice daily as well as zoloft. Has been on this regimen for years and does not wish to change things up. Denies SI/HI.   HLD - on lipitor regimen. Tolerating well without myalgias, claudication. Has not been following particular diet but also not eating much. Not exercising.   Also having vaginal discharge that is sometimes itchy, sometimes irritating. Tried monistat without relief. This has been ongoing for about a year now. Denies fevers, chills, but having some intermittent dysuria and frequency.   Depression screen [facility] 2/9 06/10/2019 06/04/2019 09/10/2018  Decreased Interest 0 3 1  Down, Depressed, Hopeless 3 2 1   PHQ - 2 Score 3 5 2   Altered sleeping 1 1 0  Tired, decreased energy 1 3 3   Change in appetite  1 1 0  Feeling bad or failure about yourself  0 1 0  Trouble concentrating 0 1 0  Moving slowly or fidgety/restless 0 2 0  Suicidal thoughts 0 0 0  PHQ-9 Score 6 14 5   Difficult doing work/chores Not difficult at all Somewhat difficult Not difficult at all  Some recent data might be hidden   GAD 7 : Generalized Anxiety Score 06/04/2019 09/14/2016  Nervous, Anxious, on Edge 2 2  Control/stop worrying 3 3  Worry too much - different things 3 3  Trouble relaxing 3 3  Restless 0 3  Easily annoyed or irritable 3 3  Afraid - awful might happen 2 1  Total GAD 7 Score 16 18  Anxiety Difficulty Somewhat difficult -   Relevant past medical, surgical, family and social history reviewed and updated as indicated. Interim medical history since our last visit reviewed. Allergies and medications reviewed and updated.  Review of Systems  Per HPI unless specifically indicated above     Objective:    BP 124/68   Pulse 70   Temp 97.7 F (36.5 C) (Oral)   Ht 5\' 4"  (1.626 m)   Wt 106 lb (48.1 kg)   BMI 18.19 kg/m   Wt Readings from Last 3 Encounters:  06/10/19 106 lb (48.1 kg)  06/10/19 106 lb 9.6 oz (48.4 kg)  06/25/18 117 lb (53.1 kg)    Physical Exam Vitals and nursing note reviewed.  Constitutional:      Comments: underweight  HENT:     Head: Atraumatic.     Nose: Nose normal.     Mouth/Throat:     Mouth: Mucous membranes are moist.     Pharynx: Oropharynx is clear.  Eyes:     Extraocular Movements: Extraocular movements intact.     Conjunctiva/sclera: Conjunctivae normal.  Cardiovascular:     Rate and Rhythm: Normal rate and regular rhythm.     Heart sounds: Normal heart sounds.  Pulmonary:     Effort: Pulmonary effort is normal. No respiratory distress.     Breath sounds: Normal breath sounds. No wheezing or rales.  Abdominal:     General: Bowel sounds are normal.     Palpations: Abdomen is soft.     Tenderness: There is no abdominal tenderness. There is no guarding.   Musculoskeletal:        General: Normal range of motion.     Cervical back: Normal range of motion and neck supple.  Skin:    General: Skin is warm and dry.  Neurological:     Mental Status: She is alert and oriented to person, place, and time.  Psychiatric:        Mood and Affect: Mood normal.        Thought Content: Thought content normal.        Judgment: Judgment normal.     Results for orders placed or performed in visit on 06/10/19  WET PREP FOR [place], YEAST, CLUE   Specimen: Vaginal Fluid  Result Value Ref Range   Trichomonas Exam Negative Negative   Yeast Exam Negative Negative   Clue Cell Exam Negative Negative  Microscopic Examination   URINE  Result Value Ref Range   WBC, UA None seen 0 - 5 /hpf   RBC None seen 0 - 2 /hpf   Epithelial Cells (non renal) 0-10 0 - 10 /hpf   Casts Present None seen /lpf   Cast Type Hyaline casts N/A   Bacteria, UA Few (A) None seen/Few  TSH  Result Value Ref Range   TSH 2.760 0.450 - 4.500 uIU/mL  CBC with Differential/Platelet out  Result Value Ref Range   WBC 7.3 3.4 - 10.8 x10E3/uL   RBC 4.75 3.77 - 5.28 x10E6/uL   Hemoglobin 15.5 11.1 - 15.9 g/dL   Hematocrit 45.7 34.0 - 46.6 %   MCV 96 79 - 97 fL   MCH 32.6 26.6 - 33.0 pg   MCHC 33.9 31.5 - 35.7 g/dL   RDW 11.6 (L) 11.7 - 15.4 %   Platelets 276 150 - 450 x10E3/uL   Neutrophils 64 Not Estab. %   Lymphs 26 Not Estab. %   Monocytes 7 Not Estab. %   Eos 1 Not Estab. %   Basos 1 Not Estab. %   Neutrophils Absolute 4.8 1.4 - 7.0 x10E3/uL   Lymphocytes Absolute 1.9 0.7 - 3.1 x10E3/uL   Monocytes Absolute 0.5 0.1 - 0.9 x10E3/uL   EOS (ABSOLUTE) 0.1 0.0 - 0.4 x10E3/uL   Basophils Absolute 0.1 0.0 - 0.2 x10E3/uL   Immature Granulocytes 1 Not Estab. %   Immature Grans (Abs) 0.1 0.0 - 0.1 x10E3/uL  Comprehensive metabolic panel  Result Value Ref Range   Glucose 88 65 - 99 mg/dL   BUN 10 8 - 27 mg/dL   Creatinine, Ser 1.21 (H) 0.57 - 1.00 mg/dL  GFR calc non Af Amer 47  (L) >59 mL/min/1.73   GFR calc Af Amer 55 (L) >59 mL/min/1.73   BUN/Creatinine Ratio 8 (L) 12 - 28   Sodium 142 134 - 144 mmol/L   Potassium 4.2 3.5 - 5.2 mmol/L   Chloride 104 96 - 106 mmol/L   CO2 24 20 - 29 mmol/L   Calcium 9.5 8.7 - 10.3 mg/dL   Total Protein 7.1 6.0 - 8.5 g/dL   Albumin 4.6 3.8 - 4.8 g/dL   Globulin, Total 2.5 1.5 - 4.5 g/dL   Albumin/Globulin Ratio 1.8 1.2 - 2.2   Bilirubin Total 0.3 0.0 - 1.2 mg/dL   Alkaline Phosphatase 79 39 - 117 IU/L   AST 21 0 - 40 IU/L   ALT 18 0 - 32 IU/L  Lipid Panel w/o Chol/HDL Ratio out  Result Value Ref Range   Cholesterol, Total 151 100 - 199 mg/dL   Triglycerides 134 0 - 149 mg/dL   HDL 57 >39 mg/dL   VLDL Cholesterol Cal 23 5 - 40 mg/dL   LDL Chol Calc (NIH) 71 0 - 99 mg/dL  UA/M w/rflx Culture, Routine   Specimen: Urine   URINE  Result Value Ref Range   Specific Gravity, UA 1.020 1.005 - 1.030   pH, UA 5.5 5.0 - 7.5   Color, UA Yellow Yellow   Appearance Ur Clear Clear   Leukocytes,UA Negative Negative   Protein,UA 2+ (A) Negative/Trace   Glucose, UA Negative Negative   Ketones, UA Negative Negative   RBC, UA Negative Negative   Bilirubin, UA Negative Negative   Urobilinogen, Ur 0.2 0.2 - 1.0 mg/dL   Nitrite, UA Negative Negative   Microscopic Examination See below:       Assessment & Plan:   Problem List Items Addressed This Visit      Cardiovascular and Mediastinum   Hypertension - Primary    BPs sometimes running high at home, but WNL today. Try adding low dose amlodipine and watching home readings closely.       Relevant Medications   amLODipine (NORVASC) 5 MG tablet   Other Relevant Orders   CBC with Differential/Platelet out (Completed)   Comprehensive metabolic panel (Completed)     Respiratory   Asthma due to environmental allergies    Continue inhaler and allergy regimen      Allergic rhinitis    Stable and under good control, continue current regimen        Digestive   Acid reflux     Stable and under good control, continue current regimen        Other   Depression, recurrent ([place])    Exacerbated by grief, but does not wish to change regimen. Continue present medications      Hyperlipidemia    Recheck lipids, adjust as needed. Continue current regimen      Relevant Medications   amLODipine (NORVASC) 5 MG tablet   Other Relevant Orders   Lipid Panel w/o Chol/HDL Ratio out (Completed)   Weight loss    Poor appetite, trial megace and will also check labs and continue to monitor. Boost samples provided today.       Relevant Orders   TSH (Completed)   Anxiety    Declines any changes or counseling, continue current regimen with zoloft and klonopin. Will work on reducing frequency of klonopin to once daily prn       Other Visit Diagnoses    Flu vaccine need  Relevant Orders   Flu Vaccine QUAD 6+ mos PF IM (Fluarix Quad PF) (Completed)   Urinary frequency       Push fluids, recheck U/A and treat as needed   Relevant Orders   UA/M w/rflx Culture, Routine (Completed)   Vaginal discharge       Will check wet prep and treat as needed. Probiotics, good vaginal hygiene reviewed   Relevant Orders   WET PREP FOR [place], YEAST, CLUE (Completed)       Follow up plan: Return in about 4 weeks (around 07/08/2019) for BP, weight f/u.

## 2019-06-11 ENCOUNTER — Telehealth: Payer: Self-pay | Admitting: *Deleted

## 2019-06-11 LAB — COMPREHENSIVE METABOLIC PANEL
ALT: 18 IU/L (ref 0–32)
AST: 21 IU/L (ref 0–40)
Albumin/Globulin Ratio: 1.8 (ref 1.2–2.2)
Albumin: 4.6 g/dL (ref 3.8–4.8)
Alkaline Phosphatase: 79 IU/L (ref 39–117)
BUN/Creatinine Ratio: 8 — ABNORMAL LOW (ref 12–28)
BUN: 10 mg/dL (ref 8–27)
Bilirubin Total: 0.3 mg/dL (ref 0.0–1.2)
CO2: 24 mmol/L (ref 20–29)
Calcium: 9.5 mg/dL (ref 8.7–10.3)
Chloride: 104 mmol/L (ref 96–106)
Creatinine, Ser: 1.21 mg/dL — ABNORMAL HIGH (ref 0.57–1.00)
GFR calc Af Amer: 55 mL/min/{1.73_m2} — ABNORMAL LOW (ref >59)
GFR calc non Af Amer: 47 mL/min/{1.73_m2} — ABNORMAL LOW (ref >59)
Globulin, Total: 2.5 g/dL (ref 1.5–4.5)
Glucose: 88 mg/dL (ref 65–99)
Potassium: 4.2 mmol/L (ref 3.5–5.2)
Sodium: 142 mmol/L (ref 134–144)
Total Protein: 7.1 g/dL (ref 6.0–8.5)

## 2019-06-11 LAB — UA/M W/RFLX CULTURE, ROUTINE
Bilirubin, UA: NEGATIVE
Glucose, UA: NEGATIVE
Ketones, UA: NEGATIVE
Leukocytes,UA: NEGATIVE
Nitrite, UA: NEGATIVE
RBC, UA: NEGATIVE
Specific Gravity, UA: 1.02 (ref 1.005–1.030)
Urobilinogen, Ur: 0.2 mg/dL (ref 0.2–1.0)
pH, UA: 5.5 (ref 5.0–7.5)

## 2019-06-11 LAB — CBC WITH DIFFERENTIAL/PLATELET
Basophils Absolute: 0.1 10*3/uL (ref 0.0–0.2)
Basos: 1 %
EOS (ABSOLUTE): 0.1 10*3/uL (ref 0.0–0.4)
Eos: 1 %
Hematocrit: 45.7 % (ref 34.0–46.6)
Hemoglobin: 15.5 g/dL (ref 11.1–15.9)
Immature Grans (Abs): 0.1 10*3/uL (ref 0.0–0.1)
Immature Granulocytes: 1 %
Lymphocytes Absolute: 1.9 10*3/uL (ref 0.7–3.1)
Lymphs: 26 %
MCH: 32.6 pg (ref 26.6–33.0)
MCHC: 33.9 g/dL (ref 31.5–35.7)
MCV: 96 fL (ref 79–97)
Monocytes Absolute: 0.5 10*3/uL (ref 0.1–0.9)
Monocytes: 7 %
Neutrophils Absolute: 4.8 10*3/uL (ref 1.4–7.0)
Neutrophils: 64 %
Platelets: 276 10*3/uL (ref 150–450)
RBC: 4.75 x10E6/uL (ref 3.77–5.28)
RDW: 11.6 % — ABNORMAL LOW (ref 11.7–15.4)
WBC: 7.3 10*3/uL (ref 3.4–10.8)

## 2019-06-11 LAB — LIPID PANEL W/O CHOL/HDL RATIO
Cholesterol, Total: 151 mg/dL (ref 100–199)
HDL: 57 mg/dL (ref >39)
LDL Chol Calc (NIH): 71 mg/dL (ref 0–99)
Triglycerides: 134 mg/dL (ref 0–149)
VLDL Cholesterol Cal: 23 mg/dL (ref 5–40)

## 2019-06-11 LAB — MICROSCOPIC EXAMINATION
RBC, Urine: NONE SEEN /hpf (ref 0–2)
WBC, UA: NONE SEEN /hpf (ref 0–5)

## 2019-06-11 LAB — WET PREP FOR TRICH, YEAST, CLUE
Clue Cell Exam: NEGATIVE
Trichomonas Exam: NEGATIVE
Yeast Exam: NEGATIVE

## 2019-06-11 LAB — TSH: TSH: 2.76 u[IU]/mL (ref 0.450–4.500)

## 2019-06-11 NOTE — Telephone Encounter (Signed)
Received referral for low dose lung cancer screening CT scan. Message left at phone number listed in EMR for patient to call me back to facilitate scheduling scan.  

## 2019-06-17 NOTE — Assessment & Plan Note (Signed)
Stable and under good control, continue current regimen 

## 2019-06-17 NOTE — Assessment & Plan Note (Addendum)
Poor appetite, trial megace and will also check labs and continue to monitor. Boost samples provided today.

## 2019-06-17 NOTE — Assessment & Plan Note (Signed)
Continue inhaler and allergy regimen

## 2019-06-17 NOTE — Assessment & Plan Note (Signed)
Exacerbated by grief, but does not wish to change regimen. Continue present medications

## 2019-06-17 NOTE — Assessment & Plan Note (Signed)
Recheck lipids, adjust as needed. Continue current regimen 

## 2019-06-17 NOTE — Assessment & Plan Note (Signed)
Declines any changes or counseling, continue current regimen with zoloft and klonopin. Will work on reducing frequency of klonopin to once daily prn

## 2019-06-17 NOTE — Assessment & Plan Note (Signed)
BPs sometimes running high at home, but WNL today. Try adding low dose amlodipine and watching home readings closely.

## 2019-06-18 ENCOUNTER — Telehealth: Payer: Self-pay | Admitting: *Deleted

## 2019-06-18 NOTE — Telephone Encounter (Signed)
Received referral for low dose lung cancer screening CT scan. Message left at phone number listed in EMR for patient to call me back to facilitate scheduling scan.  

## 2019-06-20 ENCOUNTER — Telehealth: Payer: Self-pay

## 2019-06-20 NOTE — Telephone Encounter (Signed)
Copied from [place] [id number]. Topic: General - Inquiry >> Jun 20, 2019  2:23 PM Reyne Dumas L wrote: Reason for CRM:   Pt calling to get results of lab work done at last oV. >> Jun 20, 2019  3:36 PM Sandria Manly, [place] wrote: Results from previous visit from [place]: Please let her know that her labs all came back stable, and that her wet prep and her urine testing was normal.    Called and spoke with patient. Message relayed. Patient stated that she still have vaginal discharge and would like Merrie Roof, PA-C to know about it and see if she can help her with that. Please advise

## 2019-06-20 NOTE — Telephone Encounter (Signed)
Left voicemail in attempt to schedule lung screening scan.  

## 2019-06-24 NOTE — Telephone Encounter (Signed)
Called pt to schedule, no answer, left vm 

## 2019-06-24 NOTE — Telephone Encounter (Signed)
I think she needs to be rechecked if still having symptoms. Needs in person eval with pelvic exam. Wet prep was normal when checked last

## 2019-07-05 ENCOUNTER — Other Ambulatory Visit: Payer: Self-pay | Admitting: Family Medicine

## 2019-07-05 NOTE — Telephone Encounter (Signed)
Appointment 07/08/2019

## 2019-07-08 ENCOUNTER — Ambulatory Visit: Payer: Medicare Other | Admitting: Family Medicine

## 2019-07-12 ENCOUNTER — Telehealth: Payer: Medicare Other

## 2019-07-23 ENCOUNTER — Other Ambulatory Visit: Payer: Self-pay

## 2019-07-23 ENCOUNTER — Telehealth (INDEPENDENT_AMBULATORY_CARE_PROVIDER_SITE_OTHER): Payer: Medicare Other | Admitting: Nurse Practitioner

## 2019-07-23 ENCOUNTER — Encounter: Payer: Self-pay | Admitting: Nurse Practitioner

## 2019-07-23 VITALS — BP 130/79 | HR 100 | Temp 98.7°F

## 2019-07-23 DIAGNOSIS — J019 Acute sinusitis, unspecified: Secondary | ICD-10-CM | POA: Diagnosis not present

## 2019-07-23 HISTORY — DX: Acute sinusitis, unspecified: J01.90

## 2019-07-23 MED ORDER — DOXYCYCLINE HYCLATE 100 MG PO TABS: 100.0000 mg | ORAL_TABLET | Freq: Two times a day (BID) | ORAL | 0 refills | Status: DC

## 2019-07-23 NOTE — Progress Notes (Signed)
BP 130/79 (BP Location: Left Arm, Patient Position: Sitting, Cuff Size: Normal)   Pulse 100   Temp 98.7 F (37.1 C) (Oral)    Subjective:    Patient ID: Caitlyn May, female    DOB: 1956-01-14, 64 y.o.   MRN: [id number]  HPI: Caitlyn May is a 64 y.o. female  Chief Complaint  Patient presents with  . Sinusitis    Ongoing x's 1 week. Color of sputum: Green. Patient states she has a bad taste in her mouth.   . Nasal Congestion   UPPER RESPIRATORY TRACT INFECTION Onset: 1 week ago,  Worst symptom: nasal congestion Fever: no Cough: yes; dry cough Shortness of breath: yes; has been using daily for past week Wheezing: yes Chest pain: no Chest tightness: no Chest congestion: no Nasal congestion: yes Runny nose: yes; green/brown sputum Post nasal drip: yes Sneezing: no Sore throat: yes Swollen glands: no Sinus pressure: yes; right of face; right ear Headache: yes Face pain: no Toothache: yes Ear pain: no  Ear pressure: yes "right Eyes red/itching:yes; right eye is itchy Eye drainage/crusting: no  Nausea/Vomiting: no Diarrhea: no Rash: no Fatigue: yes Sick contacts: no Strep contacts: no  Context: worse Recurrent sinusitis: no Relief with OTC cold/cough medications: yes, some but not all Treatments attempted: Mucinex every other day, drinking more water, humidifer, Tylenol  She reports she is taking her nasacort, singulair, and zrytec as prescribed.  She has had to increase the use of her albuterol due to her symptoms.  Allergies  Allergen Reactions  . Accupril [Quinapril Hcl]   . Avelox [Moxifloxacin]   . Ciprofloxacin   . Hctz [Hydrochlorothiazide] Other (See Comments)    Leg swelling  . Penicillins   . Sulfa Antibiotics    Outpatient Encounter Medications as of 07/23/2019  Medication Sig  . albuterol (VENTOLIN HFA) 108 (90 Base) MCG/ACT inhaler Inhale 2 puffs into the lungs every 6 (six) hours as needed for wheezing or shortness of breath.  Marland Kitchen  amLODipine (NORVASC) 5 MG tablet Take 1 tablet (5 mg total) by mouth daily.  Marland Kitchen atorvastatin (LIPITOR) 40 MG tablet Take 1 tablet (40 mg total) by mouth daily.  . benazepril (LOTENSIN) 40 MG tablet Take 1 tablet (40 mg total) by mouth daily.  . cetirizine (ZYRTEC) 10 MG tablet Take 1 tablet (10 mg total) by mouth daily.  . clonazePAM (KLONOPIN) 0.5 MG tablet TAKE ONE TABLET BY MOUTH TWICE DAILY AS NEEDED FOR ANXIETY  . cyclobenzaprine (FLEXERIL) 5 MG tablet TAKE ONE TABLET BY MOUTH ONCE DAILY AS NEEDED FOR MUSCLE SPASMS  . megestrol (MEGACE) 20 MG tablet Take 1 tablet (20 mg total) by mouth 2 (two) times daily.  . meloxicam (MOBIC) 7.5 MG tablet Take 1 tablet (7.5 mg total) by mouth daily.  . metoprolol succinate (TOPROL-XL) 50 MG 24 hr tablet TAKE ONE TABLET BY MOUTH EVERY DAY WITH A MEAL  . montelukast (SINGULAIR) 10 MG tablet Take 1 tablet (10 mg total) by mouth at bedtime.  . naproxen (NAPROSYN) 500 MG tablet Take 1 tablet (500 mg total) by mouth 2 (two) times daily with a meal.  . ranitidine (ZANTAC) 75 MG tablet Take 1 tablet (75 mg total) by mouth 2 (two) times daily.  . sertraline (ZOLOFT) 100 MG tablet Take 1 tablet (100 mg total) by mouth daily.  Marland Kitchen triamcinolone (NASACORT AQ) 55 MCG/ACT AERO nasal inhaler Place 2 sprays into the nose daily.  Marland Kitchen doxycycline (VIBRA-TABS) 100 MG tablet Take 1 tablet (100 mg total) by  mouth 2 (two) times daily.  Marland Kitchen gabapentin (NEURONTIN) 300 MG capsule Take 1 capsule (300 mg total) by mouth at bedtime as needed. (Patient not taking: Reported on 06/10/2019)   No facility-administered encounter medications on file as of 07/23/2019.   Patient Active Problem List   Diagnosis Date Noted  . Acute rhinosinusitis 07/23/2019  . Anxiety 04/19/2018  . Syncope 12/11/2017  . Allergic rhinitis 09/28/2017  . Asthma due to environmental allergies 08/22/2017  . Cervical spine tumor 08/22/2017  . Weight loss 07/18/2017  . Advanced care planning/counseling discussion  04/14/2017  . Vitamin D deficiency 02/07/2017  . Disturbance of skin sensation 11/29/2016  . Chronic shoulder pain (Location of Tertiary source of pain) (Bilateral) (R>L) 11/29/2016  . Chronic upper extremity pain (Bilateral) (R>L) 11/29/2016  . Chronic cervical radicular pain (Bilateral) (R>L) 11/29/2016  . Chronic hip pain (Bilateral) (R>L) 11/29/2016  . Osteoarthritis of hip (Bilateral) 11/29/2016  . Chronic sacroiliac joint pain (Bilateral) 11/29/2016  . Lumbar facet syndrome (Right) 11/29/2016  . Long term prescription benzodiazepine use 11/28/2016  . Chronic pain syndrome 11/28/2016  . Long term (current) use of opiate analgesic 11/28/2016  . Long term prescription opiate use 11/28/2016  . Opiate use 11/28/2016  . Failed back surgical syndrome 11/28/2016  . Chronic low back pain (Location of Primary Source of Pain) (Bilateral) (R>L) 11/28/2016  . Lumbar radiculitis (Right) 11/28/2016  . Chronic lower extremity pain (Bilateral) (R>L) 11/28/2016  . Chronic lumbar radicular pain (Bilateral) (R>L) 11/28/2016  . Chronic neck pain (Location of Secondary source of pain) (Bilateral) (R>L) 11/28/2016  . DDD (degenerative disc disease), lumbar 11/28/2016  . DDD (degenerative disc disease), cervical 11/28/2016  . Elevated TSH 07/08/2015  . Acid reflux 11/19/2014  . Depression, recurrent ([place])   . Hyperlipidemia   . Hypertension   . Postlaminectomy syndrome, lumbar region 11/17/2014   Past Medical History:  Diagnosis Date  . Chronic kidney disease   . Depression   . Failed back syndrome, lumbar   . Hyperlipidemia   . Hypertension   . Osteoporosis   . Weight loss    Review of Systems  Constitutional: Positive for fatigue. Negative for activity change, chills, diaphoresis and fever.  HENT: Positive for congestion, ear pain, postnasal drip, rhinorrhea, sinus pressure (unilateral - R), sinus pain (unilateral on R side) and sore throat. Negative for dental problem, drooling, ear  discharge, facial swelling, hearing loss, mouth sores, nosebleeds and sneezing.   Eyes: Positive for itching. Negative for pain, discharge and redness.  Respiratory: Positive for cough (dry), shortness of breath and wheezing. Negative for chest tightness.   Cardiovascular: Negative.  Negative for chest pain.  Gastrointestinal: Negative.  Negative for diarrhea, nausea and vomiting.  Skin: Negative.  Negative for rash.  Neurological: Positive for headaches. Negative for dizziness and light-headedness.  Hematological: Negative.  Negative for adenopathy.  Psychiatric/Behavioral: Negative.  Negative for confusion, decreased concentration and sleep disturbance. The patient is not nervous/anxious.     Per HPI unless specifically indicated above     Objective:    BP 130/79 (BP Location: Left Arm, Patient Position: Sitting, Cuff Size: Normal)   Pulse 100   Temp 98.7 F (37.1 C) (Oral)   Wt Readings from Last 3 Encounters:  06/10/19 106 lb (48.1 kg)  06/10/19 106 lb 9.6 oz (48.4 kg)  06/25/18 117 lb (53.1 kg)    Physical Exam  Physical exam unable to be performed due to lack of equipment.    Assessment & Plan:   Problem  List Items Addressed This Visit      Respiratory   Acute rhinosinusitis - Primary    Acute, ongoing.  Given length of symptoms, on verge of viral vs. Bacterial etiology.  Will start on Doxycycline 100mg  bid x5 days, as patient smokes and is at higher risk for rapid decline.  Advised to continue Mucinex, humidifier, and plenty of water.  Can attempt nasal irrigation if desired.  Encouraged scheduling COVID-19 testing to rule out.  Patient to call or return to clinic by end of week if not feeling better or symptoms worsen; may consider oral steroid.  Patient verbalized understanding.      Relevant Medications   doxycycline (VIBRA-TABS) 100 MG tablet   Other Relevant Orders   Novel Coronavirus, NAA ([facility])       Follow up plan: Return if symptoms worsen or fail to  improve.   This visit was completed via telephone due to the restrictions of the COVID-19 pandemic. All issues as above were discussed and addressed but no physical exam was performed. If it was felt that the patient should be evaluated in the office, they were directed there. The patient verbally consented to this visit. Patient was unable to complete an audio/visual visit due to Lack of equipment. . Location of the patient: home . Location of the provider: work . Those involved with this call:  . Provider: Carnella Guadalajara, DNP . CMA: Merilyn Baba, CMA . Front Desk/Registration: Jill Side  . Time spent on call: 20 minutes on the phone discussing health concerns. 30 minutes total spent in review of patient's record and preparation of their chart.  I verified patient identity using two factors (patient name and date of birth). Patient consents verbally to being seen via telemedicine visit today.

## 2019-07-23 NOTE — Patient Instructions (Signed)

## 2019-07-23 NOTE — Assessment & Plan Note (Addendum)
Acute, ongoing.  Given length of symptoms, on verge of viral vs. Bacterial etiology.  Will start on Doxycycline 100mg  bid x5 days, as patient smokes and is at higher risk for rapid decline.  Advised to continue Mucinex, humidifier, and plenty of water.  Can attempt nasal irrigation if desired.  Encouraged scheduling COVID-19 testing to rule out.  Patient to call or return to clinic by end of week if not feeling better or symptoms worsen; may consider oral steroid.  Patient verbalized understanding.

## 2019-08-04 ENCOUNTER — Other Ambulatory Visit: Payer: Self-pay | Admitting: Family Medicine

## 2019-08-04 DIAGNOSIS — M961 Postlaminectomy syndrome, not elsewhere classified: Secondary | ICD-10-CM

## 2019-08-04 NOTE — Telephone Encounter (Signed)
Requested medication (s) are due for refill today: yes  Requested medication (s) are on the active medication list: yes  Last refill:  01/18/19  Future visit scheduled: yes  Notes to clinic:  medication not delegated to NT to refill   Requested Prescriptions  Pending Prescriptions Disp Refills   cyclobenzaprine (FLEXERIL) 5 MG tablet [Pharmacy Med Name: cyclobenzaprine 5 mg tablet] 60 tablet 2    Sig: TAKE ONE TABLET BY MOUTH ONCE DAILY AS NEEDED FOR MUSCLE SPASMS      Not Delegated - Analgesics:  Muscle Relaxants Failed - 08/04/2019  8:00 AM      Failed - This refill cannot be delegated      Passed - Valid encounter within last 6 months    Recent Outpatient Visits           1 week ago Acute rhinosinusitis   [place], NP   1 month ago Essential hypertension   [facility] Merrie Roof [place], [place]   2 months ago Essential hypertension   [place], [place], [place]   3 months ago Acute maxillary sinusitis, recurrence not specified   [place], [place], [place]   9 months ago Acute sinusitis, recurrence not specified, unspecified location   [facility] Crissman, Jeannette How, MD       Future Appointments             In 58 months [facility]

## 2019-08-05 ENCOUNTER — Other Ambulatory Visit: Payer: Self-pay

## 2019-08-05 DIAGNOSIS — M961 Postlaminectomy syndrome, not elsewhere classified: Secondary | ICD-10-CM

## 2019-08-05 MED ORDER — CYCLOBENZAPRINE HCL 5 MG PO TABS: ORAL_TABLET | ORAL | 2 refills | Status: DC

## 2019-08-05 NOTE — Telephone Encounter (Signed)
[place] requesting Rx refill request on cyclobenzaprine 5 mg tab

## 2019-08-05 NOTE — Telephone Encounter (Signed)
LOV: 06/10/2019 with Merrie Roof, PA-C.

## 2019-08-12 ENCOUNTER — Ambulatory Visit: Payer: Medicare Other

## 2019-08-12 NOTE — Chronic Care Management (AMB) (Signed)
  Care Management   Follow Up Note   08/12/2019 Name: Ulanda Zellar MRN: [id number] DOB: Jun 20, 1955  Referred by: Guadalupe Maple, MD Reason for referral : Scobey is a 64 y.o. year old female who is a primary care patient of Crissman, Jeannette How, MD. The care management team was consulted for assistance with care management and care coordination needs.    Review of patient status, including review of consultants reports, relevant laboratory and other test results, and collaboration with appropriate care team members and the patient's provider was performed as part of comprehensive patient evaluation and provision of chronic care management services.    LCSW completed CCM outreach attempt today but was unable to reach patient successfully. A HIPPA compliant voice message was left encouraging patient to return call once available. LCSW rescheduled CCM SW appointment as well.  A HIPPA compliant phone message was left for the patient providing contact information and requesting a return call.   Eula Fried, BSW, MSW, [facility] Care Management [email or web address] Phone: [phone]

## 2019-08-14 ENCOUNTER — Other Ambulatory Visit: Payer: Self-pay

## 2019-08-14 ENCOUNTER — Ambulatory Visit: Admission: RE | Admit: 2019-08-14 | Payer: Medicare Other | Source: Ambulatory Visit

## 2019-08-14 ENCOUNTER — Encounter: Payer: Self-pay | Admitting: Family Medicine

## 2019-08-14 ENCOUNTER — Ambulatory Visit (INDEPENDENT_AMBULATORY_CARE_PROVIDER_SITE_OTHER): Payer: Medicare Other | Admitting: Family Medicine

## 2019-08-14 ENCOUNTER — Other Ambulatory Visit: Payer: Self-pay | Admitting: Family Medicine

## 2019-08-14 VITALS — BP 135/89 | HR 95 | Temp 98.0°F | Ht 67.0 in | Wt 101.0 lb

## 2019-08-14 DIAGNOSIS — R0600 Dyspnea, unspecified: Secondary | ICD-10-CM

## 2019-08-14 DIAGNOSIS — R0609 Other forms of dyspnea: Secondary | ICD-10-CM

## 2019-08-14 DIAGNOSIS — R634 Abnormal weight loss: Secondary | ICD-10-CM | POA: Diagnosis not present

## 2019-08-14 DIAGNOSIS — R0789 Other chest pain: Secondary | ICD-10-CM

## 2019-08-14 MED ORDER — MEGESTROL ACETATE 40 MG PO TABS: 40.0000 mg | ORAL_TABLET | Freq: Four times a day (QID) | ORAL | 2 refills | Status: DC | PRN

## 2019-08-14 NOTE — Telephone Encounter (Signed)
Routing to provider, patient seen today.

## 2019-08-14 NOTE — Telephone Encounter (Signed)
Requested medication (s) are due for refill today -yes  Requested medication (s) are on the active medication list -yes  Future visit scheduled -yes  Last refill: 07/05/19  Notes to clinic: Patient is requesting non delegated Rx- sent for PCP review   Requested Prescriptions  Pending Prescriptions Disp Refills   clonazePAM (KLONOPIN) 0.5 MG tablet [Pharmacy Med Name: clonazepam 0.5 mg tablet] 50 tablet 0    Sig: TAKE ONE TABLET BY MOUTH TWICE DAILY AS NEEDED FOR ANXIETY      Not Delegated - Psychiatry:  Anxiolytics/Hypnotics Failed - 08/14/2019 12:41 PM      Failed - This refill cannot be delegated      Failed - Urine Drug Screen completed in last 360 days.      Passed - Valid encounter within last 6 months    Recent Outpatient Visits           Today Unintended weight loss   [facility], [place], [place]   3 weeks ago Acute rhinosinusitis   [place], NP   2 months ago Essential hypertension   [facility] Merrie Roof [place], [place]   2 months ago Essential hypertension   [facility], [place], [place]   4 months ago Acute maxillary sinusitis, recurrence not specified   [facility] Volney American, [place]       Future Appointments             In 1 month Orene Desanctis, Lilia Argue, PA-C [employer], [facility]   In 10 months  [employer], [facility]                Requested Prescriptions  Pending Prescriptions Disp Refills   clonazePAM (KLONOPIN) 0.5 MG tablet [Pharmacy Med Name: clonazepam 0.5 mg tablet] 50 tablet 0    Sig: TAKE ONE TABLET BY MOUTH TWICE DAILY AS NEEDED FOR ANXIETY      Not Delegated - Psychiatry:  Anxiolytics/Hypnotics Failed - 08/14/2019 12:41 PM      Failed - This refill cannot be delegated      Failed - Urine Drug Screen completed in last 360 days.      Passed - Valid encounter within last 6 months    Recent Outpatient  Visits           Today Unintended weight loss   [place], [place], [place]   3 weeks ago Acute rhinosinusitis   [place], NP   2 months ago Essential hypertension   [facility] Merrie Roof [place], [place]   2 months ago Essential hypertension   [place], [place], [place]   4 months ago Acute maxillary sinusitis, recurrence not specified   [facility], Lilia Argue, [place]       Future Appointments             In 1 month Orene Desanctis, Lilia Argue, [place], [place]   In 26 months  [employer], [place]

## 2019-08-14 NOTE — Progress Notes (Signed)
BP 135/89   Pulse 95   Temp 98 F (36.7 C) (Oral)   Ht 5\' 7"  (1.702 m)   Wt 101 lb (45.8 kg)   SpO2 100%   BMI 15.82 kg/m    Subjective:    Patient ID: Caitlyn May, female    DOB: December 08, 1955, 64 y.o.   MRN: [id number]  HPI: Caitlyn May is a 64 y.o. female  Chief Complaint  Patient presents with  . Weight Check    pt states she has been losing weight x about 8 months, poor appetitie  . Sinusitis   Patient presenting today with concerns about her ongoing weight loss, associated with DOE, right chest pressure upon deep breathing. Eating 1-2 meals daily, forcing herself to eat but still losing weight. Tried megace which helped initially but stopped helping so she stopped it. Long smoking history, was set to get low dose screening CT scan months ago but had a phone number change and was unable to get back in touch with program coordinator to get the scan scheduled and performed. Has been lost to follow up since over the past few months mainly out of anxiety over her symptoms. Denies fevers, chills, CP, wheezing, melena.   Relevant past medical, surgical, family and social history reviewed and updated as indicated. Interim medical history since our last visit reviewed. Allergies and medications reviewed and updated.  Review of Systems  Per HPI unless specifically indicated above     Objective:    BP 135/89   Pulse 95   Temp 98 F (36.7 C) (Oral)   Ht 5\' 7"  (1.702 m)   Wt 101 lb (45.8 kg)   SpO2 100%   BMI 15.82 kg/m   Wt Readings from Last 3 Encounters:  08/14/19 101 lb (45.8 kg)  06/10/19 106 lb (48.1 kg)  06/10/19 106 lb 9.6 oz (48.4 kg)    Physical Exam Vitals and nursing note reviewed.  Constitutional:      Appearance: She is not ill-appearing.     Comments: Underweight, appears chronically ill  HENT:     Head: Atraumatic.  Eyes:     Extraocular Movements: Extraocular movements intact.     Conjunctiva/sclera: Conjunctivae normal.  Cardiovascular:      Rate and Rhythm: Normal rate and regular rhythm.     Heart sounds: Normal heart sounds.  Pulmonary:     Effort: Pulmonary effort is normal.     Breath sounds: Normal breath sounds.     Comments: Decreased breath sounds right base Abdominal:     General: Bowel sounds are normal. There is no distension.     Palpations: Abdomen is soft.     Tenderness: There is no abdominal tenderness.  Musculoskeletal:        General: Normal range of motion.     Cervical back: Normal range of motion and neck supple.  Skin:    General: Skin is warm and dry.     Coloration: Skin is pale.  Neurological:     Mental Status: She is alert and oriented to person, place, and time.  Psychiatric:        Mood and Affect: Mood normal.        Thought Content: Thought content normal.        Judgment: Judgment normal.     Results for orders placed or performed in visit on 0000000  Basic metabolic panel  Result Value Ref Range   Glucose 90 65 - 99 mg/dL   BUN 13 8 -  27 mg/dL   Creatinine, Ser 1.03 (H) 0.57 - 1.00 mg/dL   GFR calc non Af Amer 58 (L) >59 mL/min/1.73   GFR calc Af Amer 66 >59 mL/min/1.73   BUN/Creatinine Ratio 13 12 - 28   Sodium 141 134 - 144 mmol/L   Potassium 4.0 3.5 - 5.2 mmol/L   Chloride 105 96 - 106 mmol/L   CO2 23 20 - 29 mmol/L   Calcium 9.1 8.7 - 10.3 mg/dL  CBC with Differential/Platelet  Result Value Ref Range   WBC 7.4 3.4 - 10.8 x10E3/uL   RBC 4.20 3.77 - 5.28 x10E6/uL   Hemoglobin 13.3 11.1 - 15.9 g/dL   Hematocrit 39.8 34.0 - 46.6 %   MCV 95 79 - 97 fL   MCH 31.7 26.6 - 33.0 pg   MCHC 33.4 31.5 - 35.7 g/dL   RDW 11.5 (L) 11.7 - 15.4 %   Platelets 239 150 - 450 x10E3/uL   Neutrophils 61 Not Estab. %   Lymphs 26 Not Estab. %   Monocytes 10 Not Estab. %   Eos 2 Not Estab. %   Basos 1 Not Estab. %   Neutrophils Absolute 4.5 1.4 - 7.0 x10E3/uL   Lymphocytes Absolute 1.9 0.7 - 3.1 x10E3/uL   Monocytes Absolute 0.7 0.1 - 0.9 x10E3/uL   EOS (ABSOLUTE) 0.1 0.0 - 0.4  x10E3/uL   Basophils Absolute 0.1 0.0 - 0.2 x10E3/uL   Immature Granulocytes 0 Not Estab. %   Immature Grans (Abs) 0.0 0.0 - 0.1 x10E3/uL      Assessment & Plan:   Problem List Items Addressed This Visit    None    Visit Diagnoses    Unintended weight loss    -  Primary   Relevant Orders   Basic metabolic panel (Completed)   CBC with Differential/Platelet (Completed)   CT CHEST W CONTRAST   Pressure in right side of chest       Relevant Orders   CT CHEST W CONTRAST   DOE (dyspnea on exertion)       Relevant Orders   CT CHEST W CONTRAST    Strong concern for malignancy, will order stat CT chest and basic labs today. Start increased dose megace to help with appetite, recheck in 1 month. Await scan results, will refer as needed based on results.  25 minutes spent today in direct patient care, counseling, and coordination.   Follow up plan: Return in about 4 weeks (around 09/11/2019) for weight check.

## 2019-08-14 NOTE — Telephone Encounter (Signed)
Looks like you saw her today, was this addressed at visit.  Can we refill?

## 2019-08-15 ENCOUNTER — Telehealth: Payer: Self-pay | Admitting: Family Medicine

## 2019-08-15 ENCOUNTER — Other Ambulatory Visit: Payer: Self-pay

## 2019-08-15 ENCOUNTER — Ambulatory Visit
Admission: RE | Admit: 2019-08-15 | Discharge: 2019-08-15 | Disposition: A | Discharge status: Home / Self Care | Payer: Medicare Other | Source: Ambulatory Visit | Attending: Family Medicine | Admitting: Family Medicine

## 2019-08-15 DIAGNOSIS — R0789 Other chest pain: Secondary | ICD-10-CM | POA: Insufficient documentation

## 2019-08-15 DIAGNOSIS — R0609 Other forms of dyspnea: Secondary | ICD-10-CM

## 2019-08-15 DIAGNOSIS — R634 Abnormal weight loss: Secondary | ICD-10-CM | POA: Diagnosis not present

## 2019-08-15 DIAGNOSIS — R0600 Dyspnea, unspecified: Secondary | ICD-10-CM | POA: Insufficient documentation

## 2019-08-15 DIAGNOSIS — J439 Emphysema, unspecified: Secondary | ICD-10-CM | POA: Diagnosis not present

## 2019-08-15 LAB — CBC WITH DIFFERENTIAL/PLATELET
Basophils Absolute: 0.1 10*3/uL (ref 0.0–0.2)
Basos: 1 %
EOS (ABSOLUTE): 0.1 10*3/uL (ref 0.0–0.4)
Eos: 2 %
Hematocrit: 39.8 % (ref 34.0–46.6)
Hemoglobin: 13.3 g/dL (ref 11.1–15.9)
Immature Grans (Abs): 0 10*3/uL (ref 0.0–0.1)
Immature Granulocytes: 0 %
Lymphocytes Absolute: 1.9 10*3/uL (ref 0.7–3.1)
Lymphs: 26 %
MCH: 31.7 pg (ref 26.6–33.0)
MCHC: 33.4 g/dL (ref 31.5–35.7)
MCV: 95 fL (ref 79–97)
Monocytes Absolute: 0.7 10*3/uL (ref 0.1–0.9)
Monocytes: 10 %
Neutrophils Absolute: 4.5 10*3/uL (ref 1.4–7.0)
Neutrophils: 61 %
Platelets: 239 10*3/uL (ref 150–450)
RBC: 4.2 x10E6/uL (ref 3.77–5.28)
RDW: 11.5 % — ABNORMAL LOW (ref 11.7–15.4)
WBC: 7.4 10*3/uL (ref 3.4–10.8)

## 2019-08-15 LAB — BASIC METABOLIC PANEL
BUN/Creatinine Ratio: 13 (ref 12–28)
BUN: 13 mg/dL (ref 8–27)
CO2: 23 mmol/L (ref 20–29)
Calcium: 9.1 mg/dL (ref 8.7–10.3)
Chloride: 105 mmol/L (ref 96–106)
Creatinine, Ser: 1.03 mg/dL — ABNORMAL HIGH (ref 0.57–1.00)
GFR calc Af Amer: 66 mL/min/{1.73_m2} (ref >59)
GFR calc non Af Amer: 58 mL/min/{1.73_m2} — ABNORMAL LOW (ref >59)
Glucose: 90 mg/dL (ref 65–99)
Potassium: 4 mmol/L (ref 3.5–5.2)
Sodium: 141 mmol/L (ref 134–144)

## 2019-08-15 MED ORDER — IOHEXOL 300 MG/ML  SOLN
75.0000 mL | Freq: Once | INTRAMUSCULAR | Status: AC | PRN
  Administered 2019-08-15: 75 mL via INTRAVENOUS

## 2019-08-15 MED ORDER — ANORO ELLIPTA 62.5-25 MCG/INH IN AEPB: 1.0000 | INHALATION_SPRAY | Freq: Every day | RESPIRATORY_TRACT | 2 refills | Status: DC

## 2019-08-15 NOTE — Telephone Encounter (Signed)
Called pt, discussed reassuring CT chest results with no evidence of malignancy. Will work on increasing PO intake and monitor for benefit with megace. Will also start anoro to see if this improves her DOE she's complaining of. Recheck in 1 month as scheduled.

## 2019-08-16 ENCOUNTER — Telehealth: Payer: Self-pay

## 2019-08-16 DIAGNOSIS — J449 Chronic obstructive pulmonary disease, unspecified: Secondary | ICD-10-CM

## 2019-08-16 NOTE — Telephone Encounter (Signed)
PA denied. We should receive a fax as to why.  Routing to provider as Juluis Rainier.

## 2019-08-16 NOTE — Telephone Encounter (Signed)
Prior Authorization initiated via CoverMyMeds for Anoro Ellipta 62.5-25MCG/INH aerosol powde Key: [employer]

## 2019-08-20 DIAGNOSIS — J432 Centrilobular emphysema: Secondary | ICD-10-CM | POA: Insufficient documentation

## 2019-08-20 DIAGNOSIS — J449 Chronic obstructive pulmonary disease, unspecified: Secondary | ICD-10-CM | POA: Insufficient documentation

## 2019-08-20 MED ORDER — SPIRIVA HANDIHALER 18 MCG IN CAPS: 18.0000 ug | ORAL_CAPSULE | Freq: Every day | RESPIRATORY_TRACT | 2 refills | Status: DC

## 2019-08-20 NOTE — Telephone Encounter (Signed)
Will send spiriva to see if better covered

## 2019-08-20 NOTE — Telephone Encounter (Signed)
Patient notified

## 2019-08-30 ENCOUNTER — Other Ambulatory Visit: Payer: Self-pay | Admitting: Family Medicine

## 2019-09-06 ENCOUNTER — Encounter: Payer: Self-pay | Admitting: *Deleted

## 2019-09-16 ENCOUNTER — Other Ambulatory Visit: Payer: Self-pay

## 2019-09-16 ENCOUNTER — Encounter: Payer: Self-pay | Admitting: Family Medicine

## 2019-09-16 ENCOUNTER — Ambulatory Visit (INDEPENDENT_AMBULATORY_CARE_PROVIDER_SITE_OTHER): Payer: Medicare Other | Admitting: Family Medicine

## 2019-09-16 VITALS — BP 103/72 | HR 94 | Temp 97.4°F | Wt 98.0 lb

## 2019-09-16 DIAGNOSIS — E440 Moderate protein-calorie malnutrition: Secondary | ICD-10-CM

## 2019-09-16 DIAGNOSIS — F419 Anxiety disorder, unspecified: Secondary | ICD-10-CM

## 2019-09-16 DIAGNOSIS — K5900 Constipation, unspecified: Secondary | ICD-10-CM | POA: Diagnosis not present

## 2019-09-16 DIAGNOSIS — Z1211 Encounter for screening for malignant neoplasm of colon: Secondary | ICD-10-CM | POA: Diagnosis not present

## 2019-09-16 DIAGNOSIS — R634 Abnormal weight loss: Secondary | ICD-10-CM

## 2019-09-16 DIAGNOSIS — R0602 Shortness of breath: Secondary | ICD-10-CM | POA: Diagnosis not present

## 2019-09-16 DIAGNOSIS — J449 Chronic obstructive pulmonary disease, unspecified: Secondary | ICD-10-CM | POA: Diagnosis not present

## 2019-09-16 MED ORDER — IPRATROPIUM-ALBUTEROL 0.5-2.5 (3) MG/3ML IN SOLN: 3.0000 mL | RESPIRATORY_TRACT | 1 refills | Status: DC | PRN

## 2019-09-16 MED ORDER — CLONAZEPAM 0.5 MG PO TABS: 0.5000 mg | ORAL_TABLET | Freq: Two times a day (BID) | ORAL | 1 refills | Status: DC | PRN

## 2019-09-16 MED ORDER — PREDNISONE 10 MG PO TABS: ORAL_TABLET | ORAL | 0 refills | Status: DC

## 2019-09-16 NOTE — Progress Notes (Signed)
BP 103/72   Pulse 94   Temp (!) 97.4 F (36.3 C) (Oral)   Wt 98 lb (44.5 kg)   SpO2 97%   BMI 15.35 kg/m    Subjective:    Patient ID: Caitlyn May, female    DOB: 03-May-1956, 64 y.o.   MRN: [id number]  HPI: Caitlyn May is a 64 y.o. female  Chief Complaint  Patient presents with  . Weight Check   Here today for weight follow up. Felt like she was eating better, but then got incredibly constipated which discourages her from eating. Has barely wanted to eat the past few days. Of note, she has always declined screening colonoscopies but is willing to go to GI and discuss one at this point due to all of her health concerns. Also feels incredibly anxious due to her health issues, which is ruining her appetite as well. Productive cough, getting so short of breath that she vomits. Right rib area/lower chest pain has been worse since 3 days ago. Recent CT Chest without evidence of masses. Has not had her inhalers for months because she can't afford them.    Relevant past medical, surgical, family and social history reviewed and updated as indicated. Interim medical history since our last visit reviewed. Allergies and medications reviewed and updated.  Review of Systems  Per HPI unless specifically indicated above     Objective:    BP 103/72   Pulse 94   Temp (!) 97.4 F (36.3 C) (Oral)   Wt 98 lb (44.5 kg)   SpO2 97%   BMI 15.35 kg/m   Wt Readings from Last 3 Encounters:  09/16/19 98 lb (44.5 kg)  08/14/19 101 lb (45.8 kg)  06/10/19 106 lb (48.1 kg)    Physical Exam Vitals and nursing note reviewed.  Constitutional:      General: She is not in acute distress.    Comments: Significantly underweight  HENT:     Head: Atraumatic.  Eyes:     Extraocular Movements: Extraocular movements intact.     Conjunctiva/sclera: Conjunctivae normal.  Cardiovascular:     Rate and Rhythm: Normal rate and regular rhythm.     Heart sounds: Normal heart sounds.  Pulmonary:    Effort: Pulmonary effort is normal.     Breath sounds: No wheezing or rales.     Comments: Breath sounds decreased throughout Musculoskeletal:        General: Normal range of motion.     Cervical back: Normal range of motion and neck supple.  Skin:    General: Skin is warm and dry.  Neurological:     Mental Status: She is alert and oriented to person, place, and time.  Psychiatric:        Thought Content: Thought content normal.        Judgment: Judgment normal.     Comments: Tearful, anxious     Results for orders placed or performed in visit on 0000000  Basic metabolic panel  Result Value Ref Range   Glucose 90 65 - 99 mg/dL   BUN 13 8 - 27 mg/dL   Creatinine, Ser 1.03 (H) 0.57 - 1.00 mg/dL   GFR calc non Af Amer 58 (L) >59 mL/min/1.73   GFR calc Af Amer 66 >59 mL/min/1.73   BUN/Creatinine Ratio 13 12 - 28   Sodium 141 134 - 144 mmol/L   Potassium 4.0 3.5 - 5.2 mmol/L   Chloride 105 96 - 106 mmol/L   CO2 23 20 -  29 mmol/L   Calcium 9.1 8.7 - 10.3 mg/dL  CBC with Differential/Platelet  Result Value Ref Range   WBC 7.4 3.4 - 10.8 x10E3/uL   RBC 4.20 3.77 - 5.28 x10E6/uL   Hemoglobin 13.3 11.1 - 15.9 g/dL   Hematocrit 39.8 34.0 - 46.6 %   MCV 95 79 - 97 fL   MCH 31.7 26.6 - 33.0 pg   MCHC 33.4 31.5 - 35.7 g/dL   RDW 11.5 (L) 11.7 - 15.4 %   Platelets 239 150 - 450 x10E3/uL   Neutrophils 61 Not Estab. %   Lymphs 26 Not Estab. %   Monocytes 10 Not Estab. %   Eos 2 Not Estab. %   Basos 1 Not Estab. %   Neutrophils Absolute 4.5 1.4 - 7.0 x10E3/uL   Lymphocytes Absolute 1.9 0.7 - 3.1 x10E3/uL   Monocytes Absolute 0.7 0.1 - 0.9 x10E3/uL   EOS (ABSOLUTE) 0.1 0.0 - 0.4 x10E3/uL   Basophils Absolute 0.1 0.0 - 0.2 x10E3/uL   Immature Granulocytes 0 Not Estab. %   Immature Grans (Abs) 0.0 0.0 - 0.1 x10E3/uL      Assessment & Plan:   Problem List Items Addressed This Visit      Respiratory   COPD (chronic obstructive pulmonary disease) ([place])    Unable to afford  inhalers, will refer to [facility] for assistance here and given breztri samples (these were the only samples available currently) to try in meantime. Will also start a prednisone taper to help immediately with her SOB.       Relevant Medications   predniSONE (DELTASONE) 10 MG tablet   ipratropium-albuterol (DUONEB) 0.5-2.5 (3) MG/3ML SOLN   Other Relevant Orders   Referral to Chronic Care Management Services     Other   Weight loss    Ongoing, down now to BMI of 15. Referring out to GI for further evaluation as well as hopefully a colonoscopy as to my knowledge she's always refused them. Certainly there is concern for underlying malignancy given her ongoing fatigue and weight loss though she is known to have poor oral intake despite megace, diet counseling and efforts toward mood and anxiety control. Will await GI evaluation and continue to monitor closely. Pt aware to increase oral intake as much as possible, recommended good bowel regimen to combat her constipation that is a barrier mentally to her eating well.       Relevant Orders   Ambulatory referral to Gastroenterology   Anxiety - Primary    Chronic but exacerbated due to her health concerns. Continue current regimen with close monitoring, may need to increase klonopin amount to twice daily more frequently until she's feeling better overall but will try limiting use as much as possible       Other Visit Diagnoses    Shortness of breath       EKG today without acute changes, suspect at least in part due to uncontrolled COPD but concern for cardiac contribution. Declines Cardiology referral for now   Relevant Orders   EKG 12-Lead (Completed)   Constipation, unspecified constipation type       Relevant Orders   Ambulatory referral to Gastroenterology   Moderate protein-calorie malnutrition [facility])       Relevant Orders   Ambulatory referral to Gastroenterology   Colon cancer screening       Relevant Orders   Ambulatory referral  to Gastroenterology      50 minutes today spent in direct care and counseling with patient  as well as coordinating services  Follow up plan: Return in about 4 weeks (around 10/14/2019) for SOB and weight .

## 2019-09-17 ENCOUNTER — Telehealth: Payer: Self-pay | Admitting: Family Medicine

## 2019-09-17 ENCOUNTER — Telehealth: Payer: Self-pay

## 2019-09-17 NOTE — Telephone Encounter (Signed)
Rx for nebulizer machine filled out and placed in providers box for signature. Called patient to ask where she wants [place] to send in the Rx. LVM for patient to return call and let us know where to fax it.

## 2019-09-17 NOTE — Assessment & Plan Note (Signed)
Chronic but exacerbated due to her health concerns. Continue current regimen with close monitoring, may need to increase klonopin amount to twice daily more frequently until she's feeling better overall but will try limiting use as much as possible

## 2019-09-17 NOTE — Chronic Care Management (AMB) (Signed)
  Chronic Care Management   Outreach Note  09/17/2019 Name: Omaira Kohut MRN: [id number] DOB: 07/16/1955  Eraina Brann is a 64 y.o. year old female who is a primary care patient of Volney American, [place]. I reached out to [facility] by phone today in response to a referral sent by [name] PCP, Merrie Roof PA-C     An unsuccessful telephone outreach was attempted today. The patient was referred to the case management team for assistance with care management and care coordination.   Follow Up Plan: A HIPPA compliant phone message was left for the patient providing contact information and requesting a return call.  The care management team will reach out to the patient again over the next 7 days.  If patient returns call to provider office, please advise to call Embedded Care Management Care Guide Glenna Durand LPN at [phone]  Sybol Morre, LPN Health Advisor, [place] Management ??[email or web address] ??[phone]

## 2019-09-17 NOTE — Assessment & Plan Note (Signed)
Unable to afford inhalers, will refer to [facility] for assistance here and given breztri samples (these were the only samples available currently) to try in meantime. Will also start a prednisone taper to help immediately with her SOB.

## 2019-09-17 NOTE — Telephone Encounter (Signed)
-----   Message from Volney American, [place] sent at 09/17/2019  7:26 AM EDT ----- Regarding: Nebulizer I completely forgot to ask for a nebulizer order for her yesterday. Is there any way you can get that going for me?

## 2019-09-17 NOTE — Assessment & Plan Note (Signed)
Ongoing, down now to BMI of 15. Referring out to GI for further evaluation as well as hopefully a colonoscopy as to my knowledge she's always refused them. Certainly there is concern for underlying malignancy given her ongoing fatigue and weight loss though she is known to have poor oral intake despite megace, diet counseling and efforts toward mood and anxiety control. Will await GI evaluation and continue to monitor closely. Pt aware to increase oral intake as much as possible, recommended good bowel regimen to combat her constipation that is a barrier mentally to her eating well.

## 2019-09-19 ENCOUNTER — Telehealth: Payer: Self-pay

## 2019-09-19 NOTE — Telephone Encounter (Signed)
PA for Ipratropium- Albuterol 0.5-2.5 Solution initiated and submitted via Cover My Meds. Key: [id number]

## 2019-09-20 ENCOUNTER — Telehealth: Payer: Self-pay | Admitting: Family Medicine

## 2019-09-20 DIAGNOSIS — Z1231 Encounter for screening mammogram for malignant neoplasm of breast: Secondary | ICD-10-CM

## 2019-09-20 DIAGNOSIS — R5383 Other fatigue: Secondary | ICD-10-CM

## 2019-09-20 DIAGNOSIS — R200 Anesthesia of skin: Secondary | ICD-10-CM

## 2019-09-20 NOTE — Chronic Care Management (AMB) (Signed)
  Chronic Care Management   Note  09/20/2019 Name: Caitlyn May MRN: [id number] DOB: [birth year]  Caitlyn May is a 64 y.o. year old female who is a primary care patient of Volney American, [place]. I reached out to [facility] by phone today in response to a referral sent by Caitlyn May's PCP, Merrie Roof PA-C     Caitlyn May was given information about Chronic Care Management services today including:  1. CCM service includes personalized support from designated clinical staff supervised by her physician, including individualized plan of care and coordination with other care providers 2. 24/7 contact phone numbers for assistance for urgent and routine care needs. 3. Service will only be billed when office clinical staff spend 20 minutes or more in a month to coordinate care. 4. Only one practitioner may furnish and bill the service in a calendar month. 5. The patient may stop CCM services at any time (effective at the end of the month) by phone call to the office staff. 6. The patient will be responsible for cost sharing (co-pay) of up to 20% of the service fee (after annual deductible is met).  Patient agreed to services and verbal consent obtained.   Follow up plan: Telephone appointment with care management team member scheduled for:10/22/2019  Caitlyn May, [address] Management ??[email or web address] ??[phone]

## 2019-09-20 NOTE — Telephone Encounter (Signed)
Copied from [place] [id number]. Topic: Quick Communication - Rx Refill/Question >> Sep 20, 2019  9:30 AM Erick Blinks wrote: Medication: predniSONE (DELTASONE) 10 MG tablet  Pt called and reported that she does not notice any improvement or changes since taking the medicine, requesting a call back  [phone]

## 2019-09-20 NOTE — Telephone Encounter (Signed)
Called pt, no benefit with prednisone thus far, still incredibly fatigued, having shortness of breath but no distress, fevers, syncope, etc. Now also having numbness and tingling of hands and wrists which is new for her. Will order labs and have her come in Monday if not feeling better over weekend. She also is now agreeable to her screening mammogram, will place order and gave pt phone number for scheduling. Knows to go to ER for worsening sxs

## 2019-09-23 NOTE — Telephone Encounter (Signed)
Rx faxed over to [facility]. Pt notified

## 2019-09-23 NOTE — Telephone Encounter (Signed)
PA denied. Denial fax placed in providers folder for review.

## 2019-09-30 ENCOUNTER — Ambulatory Visit (INDEPENDENT_AMBULATORY_CARE_PROVIDER_SITE_OTHER): Payer: 59 | Admitting: Family Medicine

## 2019-09-30 ENCOUNTER — Encounter: Payer: Self-pay | Admitting: Family Medicine

## 2019-09-30 VITALS — BP 132/79 | HR 91

## 2019-09-30 DIAGNOSIS — K921 Melena: Secondary | ICD-10-CM

## 2019-09-30 DIAGNOSIS — R0602 Shortness of breath: Secondary | ICD-10-CM

## 2019-09-30 DIAGNOSIS — R112 Nausea with vomiting, unspecified: Secondary | ICD-10-CM

## 2019-09-30 NOTE — Progress Notes (Incomplete)
   BP 132/79   Pulse 91    Subjective:    Patient ID: Caitlyn May, female    DOB: 05-30-1956, 64 y.o.   MRN: [id number]  HPI: Caitlyn May is a 64 y.o. female  Chief Complaint  Patient presents with  . Weight Loss  . Breathing Problem   Not feeling any better, last week started vomiting black and had blood in stools. Had significant weakness. Has not thrown up in 3 days.   Relevant past medical, surgical, family and social history reviewed and updated as indicated. Interim medical history since our last visit reviewed. Allergies and medications reviewed and updated.  Review of Systems  Per HPI unless specifically indicated above     Objective:    BP 132/79   Pulse 91   Wt Readings from Last 3 Encounters:  09/16/19 98 lb (44.5 kg)  08/14/19 101 lb (45.8 kg)  06/10/19 106 lb (48.1 kg)    Physical Exam  Results for orders placed or performed in visit on 0000000  Basic metabolic panel  Result Value Ref Range   Glucose 90 65 - 99 mg/dL   BUN 13 8 - 27 mg/dL   Creatinine, Ser 1.03 (H) 0.57 - 1.00 mg/dL   GFR calc non Af Amer 58 (L) >59 mL/min/1.73   GFR calc Af Amer 66 >59 mL/min/1.73   BUN/Creatinine Ratio 13 12 - 28   Sodium 141 134 - 144 mmol/L   Potassium 4.0 3.5 - 5.2 mmol/L   Chloride 105 96 - 106 mmol/L   CO2 23 20 - 29 mmol/L   Calcium 9.1 8.7 - 10.3 mg/dL  CBC with Differential/Platelet  Result Value Ref Range   WBC 7.4 3.4 - 10.8 x10E3/uL   RBC 4.20 3.77 - 5.28 x10E6/uL   Hemoglobin 13.3 11.1 - 15.9 g/dL   Hematocrit 39.8 34.0 - 46.6 %   MCV 95 79 - 97 fL   MCH 31.7 26.6 - 33.0 pg   MCHC 33.4 31.5 - 35.7 g/dL   RDW 11.5 (L) 11.7 - 15.4 %   Platelets 239 150 - 450 x10E3/uL   Neutrophils 61 Not Estab. %   Lymphs 26 Not Estab. %   Monocytes 10 Not Estab. %   Eos 2 Not Estab. %   Basos 1 Not Estab. %   Neutrophils Absolute 4.5 1.4 - 7.0 x10E3/uL   Lymphocytes Absolute 1.9 0.7 - 3.1 x10E3/uL   Monocytes Absolute 0.7 0.1 - 0.9 x10E3/uL    EOS (ABSOLUTE) 0.1 0.0 - 0.4 x10E3/uL   Basophils Absolute 0.1 0.0 - 0.2 x10E3/uL   Immature Granulocytes 0 Not Estab. %   Immature Grans (Abs) 0.0 0.0 - 0.1 x10E3/uL      Assessment & Plan:   Problem List Items Addressed This Visit    None       Follow up plan: No follow-ups on file.

## 2019-10-02 ENCOUNTER — Ambulatory Visit: Payer: Medicare Other

## 2019-10-02 NOTE — Chronic Care Management (AMB) (Signed)
  Care Management   Follow Up Note   10/02/2019 Name: Caitlyn May MRN: [id number] DOB: 01-30-1956  Referred by: Volney American, PA-C Reason for referral : Kellogg is a 64 y.o. year old female who is a primary care patient of Volney American, [place]. The care management team was consulted for assistance with care management and care coordination needs.    Review of patient status, including review of consultants reports, relevant laboratory and other test results, and collaboration with appropriate care team members and the patient's provider was performed as part of comprehensive patient evaluation and provision of chronic care management services.    LCSW completed CCM outreach attempt today but was unable to reach patient successfully. A HIPPA compliant voice message was left encouraging patient to return call once available. LCSW rescheduled CCM SW appointment as well.  A HIPPA compliant phone message was left for the patient providing contact information and requesting a return call.   Eula Fried, BSW, MSW, [facility] Care Management [email or web address] Phone: [phone]

## 2019-10-07 NOTE — Progress Notes (Signed)
BP 132/79   Pulse 91    Subjective:    Patient ID: Caitlyn May, female    DOB: 16-Jul-1955, 64 y.o.   MRN: [id number]  HPI: Caitlyn May is a 64 y.o. female  Chief Complaint  Patient presents with  . Weight Loss  . Breathing Problem    . This visit was completed via telephone due to the restrictions of the COVID-19 pandemic. All issues as above were discussed and addressed. Physical exam was done as above through visual confirmation on telephone. If it was felt that the patient should be evaluated in the office, they were directed there. The patient verbally consented to this visit. . Location of the patient: home . Location of the provider: work . Those involved with this call:  . Provider: Merrie Roof, PA-C . CMA: Tiffany Reel, CMA . Front Desk/Registration: Jill Side  . Time spent on call: 25 minutes on the phone discussing health concerns. 5 minutes total spent in review of patient's record and preparation of their chart. I verified patient identity using two factors (patient name and date of birth). Patient consents verbally to being seen via telemedicine visit today.   Not feeling any better after prednisone, last week started vomiting black and had blood in stools in addition to continued SOB. Had significant weakness and felt dehydrated. Feeling quite a bit better now. Has not thrown up in 3 days and has been tolerating some PO in that time. No further black stools. Has not been trying anything OTC for sxs. Awaiting GI workup for bowel issues and weight loss in 3 weeks. Declines going to ER for these sxs and immediate testing.   Relevant past medical, surgical, family and social history reviewed and updated as indicated. Interim medical history since our last visit reviewed. Allergies and medications reviewed and updated.  Review of Systems  Per HPI unless specifically indicated above     Objective:    BP 132/79   Pulse 91   Wt Readings from Last 3  Encounters:  09/16/19 98 lb (44.5 kg)  08/14/19 101 lb (45.8 kg)  06/10/19 106 lb (48.1 kg)    Physical Exam  Unable to perform PE due to patient lack of access to video technology for visit  Results for orders placed or performed in visit on 0000000  Basic metabolic panel  Result Value Ref Range   Glucose 90 65 - 99 mg/dL   BUN 13 8 - 27 mg/dL   Creatinine, Ser 1.03 (H) 0.57 - 1.00 mg/dL   GFR calc non Af Amer 58 (L) >59 mL/min/1.73   GFR calc Af Amer 66 >59 mL/min/1.73   BUN/Creatinine Ratio 13 12 - 28   Sodium 141 134 - 144 mmol/L   Potassium 4.0 3.5 - 5.2 mmol/L   Chloride 105 96 - 106 mmol/L   CO2 23 20 - 29 mmol/L   Calcium 9.1 8.7 - 10.3 mg/dL  CBC with Differential/Platelet  Result Value Ref Range   WBC 7.4 3.4 - 10.8 x10E3/uL   RBC 4.20 3.77 - 5.28 x10E6/uL   Hemoglobin 13.3 11.1 - 15.9 g/dL   Hematocrit 39.8 34.0 - 46.6 %   MCV 95 79 - 97 fL   MCH 31.7 26.6 - 33.0 pg   MCHC 33.4 31.5 - 35.7 g/dL   RDW 11.5 (L) 11.7 - 15.4 %   Platelets 239 150 - 450 x10E3/uL   Neutrophils 61 Not Estab. %   Lymphs 26 Not Estab. %   Monocytes  10 Not Estab. %   Eos 2 Not Estab. %   Basos 1 Not Estab. %   Neutrophils Absolute 4.5 1.4 - 7.0 x10E3/uL   Lymphocytes Absolute 1.9 0.7 - 3.1 x10E3/uL   Monocytes Absolute 0.7 0.1 - 0.9 x10E3/uL   EOS (ABSOLUTE) 0.1 0.0 - 0.4 x10E3/uL   Basophils Absolute 0.1 0.0 - 0.2 x10E3/uL   Immature Granulocytes 0 Not Estab. %   Immature Grans (Abs) 0.0 0.0 - 0.1 x10E3/uL      Assessment & Plan:   Problem List Items Addressed This Visit    None    Visit Diagnoses    SOB (shortness of breath)    -  Primary   Working on inhaler coverage, suspect related to poorly controlled COPD. CT Chest without acute abnormality recently. Continue to monitor   Black stools       Strongly suggested ER for new GI sxs, discussed ddx of gastric ulcers, IBD, colon cancer, etc. Declines fervently and wanting to wait for outpt appt to GI   Non-intractable  vomiting with nausea, unspecified vomiting type       Resolved for the most part, tolerating PO now and regaining some energy. Urged ER eval for rehydration and labs/imaging but pt declines. Push PO, anti emetics.       Follow up plan: Return for as scheduled.

## 2019-10-14 ENCOUNTER — Ambulatory Visit (INDEPENDENT_AMBULATORY_CARE_PROVIDER_SITE_OTHER): Payer: Medicare HMO | Admitting: Family Medicine

## 2019-10-14 ENCOUNTER — Encounter: Payer: Self-pay | Admitting: Family Medicine

## 2019-10-14 VITALS — BP 140/84 | HR 74

## 2019-10-14 DIAGNOSIS — R112 Nausea with vomiting, unspecified: Secondary | ICD-10-CM | POA: Diagnosis not present

## 2019-10-14 DIAGNOSIS — K5900 Constipation, unspecified: Secondary | ICD-10-CM

## 2019-10-14 DIAGNOSIS — R634 Abnormal weight loss: Secondary | ICD-10-CM | POA: Diagnosis not present

## 2019-10-14 DIAGNOSIS — K921 Melena: Secondary | ICD-10-CM | POA: Diagnosis not present

## 2019-10-14 MED ORDER — ONDANSETRON 8 MG PO TBDP
8.0000 mg | ORAL_TABLET | Freq: Three times a day (TID) | ORAL | 0 refills | Status: DC | PRN
Start: 2019-10-14 — End: 2022-10-14

## 2019-10-14 NOTE — Progress Notes (Signed)
BP 140/84   Pulse 74    Subjective:    Patient ID: Caitlyn May, female    DOB: 1955-06-06, 64 y.o.   MRN: [id number]  HPI: Caitlyn May is a 64 y.o. female  Chief Complaint  Patient presents with  . Shortness of Breath  . Weight Check    . This visit was completed via telephone due to the restrictions of the COVID-19 pandemic. All issues as above were discussed and addressed. Physical exam was done as above through visual confirmation on telephone. If it was felt that the patient should be evaluated in the office, they were directed there. The patient verbally consented to this visit. . Location of the patient: home . Location of the provider: work . Those involved with this call:  . Provider: Merrie Roof, PA-C . CMA: Lesle Chris, [place] . Front Desk/Registration: Jill Side  . Time spent on call: 25 minutes on the phone discussing health concerns. 5 minutes total spent in review of patient's record and preparation of their chart. I verified patient identity using two factors (patient name and date of birth). Patient consents verbally to being seen via telemedicine visit today.   Presenting today following up on worsening GI sxs. The past couple of months has been having waxing and waning varied GI issues - severe constipation, bouts of diarrhea, black stools, coffee ground dark vomit (one episode), days of intractable vomiting quite often, weakness, and 15 or so lb weight loss the last 6 months. Still vomiting quite often, and now the past 2 days having fevers around 101 and sweats, chills. Has not had any diarrhea lately. Does have abdominal pain but states it does not feel different than her typical chronic abdominal pain. Has been eating ok, megace has been helping with appetite and overall keeping things down especially the past day. Unable to weigh for today's visit. Unfortunately several of her recent OVs have been limited to telephone visits and patient has not been  agreeable several times to go to the ED for her severe bouts of sxs so not much information is known. Last labs were from 3/21 which were overall benign, has not presented for repeat labs the past month due to severity of fatigue/weakness. To my knowledge, she has never had a colonoscopy - has always declined them but her sxs are severe to where she is very interested in one at this point.   Relevant past medical, surgical, family and social history reviewed and updated as indicated. Interim medical history since our last visit reviewed. Allergies and medications reviewed and updated.  Review of Systems  Per HPI unless specifically indicated above     Objective:    BP 140/84   Pulse 74   Wt Readings from Last 3 Encounters:  09/16/19 98 lb (44.5 kg)  08/14/19 101 lb (45.8 kg)  06/10/19 106 lb (48.1 kg)    Physical Exam  Unable to perform PE due to patient lack of access to video technology  Results for orders placed or performed in visit on 0000000  Basic metabolic panel  Result Value Ref Range   Glucose 90 65 - 99 mg/dL   BUN 13 8 - 27 mg/dL   Creatinine, Ser 1.03 (H) 0.57 - 1.00 mg/dL   GFR calc non Af Amer 58 (L) >59 mL/min/1.73   GFR calc Af Amer 66 >59 mL/min/1.73   BUN/Creatinine Ratio 13 12 - 28   Sodium 141 134 - 144 mmol/L   Potassium 4.0 3.5 -  5.2 mmol/L   Chloride 105 96 - 106 mmol/L   CO2 23 20 - 29 mmol/L   Calcium 9.1 8.7 - 10.3 mg/dL  CBC with Differential/Platelet  Result Value Ref Range   WBC 7.4 3.4 - 10.8 x10E3/uL   RBC 4.20 3.77 - 5.28 x10E6/uL   Hemoglobin 13.3 11.1 - 15.9 g/dL   Hematocrit 39.8 34.0 - 46.6 %   MCV 95 79 - 97 fL   MCH 31.7 26.6 - 33.0 pg   MCHC 33.4 31.5 - 35.7 g/dL   RDW 11.5 (L) 11.7 - 15.4 %   Platelets 239 150 - 450 x10E3/uL   Neutrophils 61 Not Estab. %   Lymphs 26 Not Estab. %   Monocytes 10 Not Estab. %   Eos 2 Not Estab. %   Basos 1 Not Estab. %   Neutrophils Absolute 4.5 1.4 - 7.0 x10E3/uL   Lymphocytes Absolute 1.9  0.7 - 3.1 x10E3/uL   Monocytes Absolute 0.7 0.1 - 0.9 x10E3/uL   EOS (ABSOLUTE) 0.1 0.0 - 0.4 x10E3/uL   Basophils Absolute 0.1 0.0 - 0.2 x10E3/uL   Immature Granulocytes 0 Not Estab. %   Immature Grans (Abs) 0.0 0.0 - 0.1 x10E3/uL      Assessment & Plan:   Problem List Items Addressed This Visit      Other   Weight loss   Relevant Orders   Ambulatory referral to Gastroenterology    Other Visit Diagnoses    Non-intractable vomiting with nausea, unspecified vomiting type    -  Primary   Relevant Orders   Ambulatory referral to Gastroenterology   Constipation, unspecified constipation type       Relevant Orders   Ambulatory referral to Gastroenterology   Black stools       could have been related to burst of prednisone for COPD exacerbation, resolved currently   Relevant Orders   Ambulatory referral to Gastroenterology      Sxs severe, intermittent and seem to be changing frequently. Have been unable to examine or perform labs throughout the past month or so of development of them due to virtual nature of visits, so only able to collect verbal data at this time. She has already waited weeks to get in with GI as outpatient and unfortunately the appt she had for this week was moved back to mid-June due to scheduling conflicts at the clinic so I have placed an urgent referral to see if she can be seen elsewhere much sooner. Still declines going to ER for immediate evaluation. Strongly urged going for worsening sxs prior to getting in with GI.   Follow up plan: Return in about 4 weeks (around 11/11/2019) for Weight check.

## 2019-10-15 ENCOUNTER — Telehealth: Payer: Self-pay | Admitting: Family Medicine

## 2019-10-15 ENCOUNTER — Encounter: Payer: Self-pay | Admitting: Family Medicine

## 2019-10-15 NOTE — Telephone Encounter (Signed)
-----   Message from Volney American, [place] sent at 10/14/2019  2:40 PM EDT ----- 1 month weight check in person

## 2019-10-15 NOTE — Telephone Encounter (Signed)
Lvm,sent letter.  

## 2019-10-16 ENCOUNTER — Ambulatory Visit: Payer: Medicare Other | Admitting: Gastroenterology

## 2019-10-17 ENCOUNTER — Other Ambulatory Visit: Payer: Self-pay | Admitting: Gastroenterology

## 2019-10-17 DIAGNOSIS — R194 Change in bowel habit: Secondary | ICD-10-CM | POA: Diagnosis not present

## 2019-10-17 DIAGNOSIS — R1084 Generalized abdominal pain: Secondary | ICD-10-CM

## 2019-10-17 DIAGNOSIS — K921 Melena: Secondary | ICD-10-CM | POA: Diagnosis not present

## 2019-10-17 DIAGNOSIS — F411 Generalized anxiety disorder: Secondary | ICD-10-CM | POA: Diagnosis not present

## 2019-10-17 DIAGNOSIS — Z791 Long term (current) use of non-steroidal anti-inflammatories (NSAID): Secondary | ICD-10-CM | POA: Diagnosis not present

## 2019-10-17 DIAGNOSIS — K219 Gastro-esophageal reflux disease without esophagitis: Secondary | ICD-10-CM | POA: Diagnosis not present

## 2019-10-17 DIAGNOSIS — R1310 Dysphagia, unspecified: Secondary | ICD-10-CM | POA: Diagnosis not present

## 2019-10-17 DIAGNOSIS — R634 Abnormal weight loss: Secondary | ICD-10-CM | POA: Diagnosis not present

## 2019-10-17 DIAGNOSIS — R112 Nausea with vomiting, unspecified: Secondary | ICD-10-CM | POA: Diagnosis not present

## 2019-10-22 ENCOUNTER — Ambulatory Visit: Payer: Self-pay | Admitting: Pharmacist

## 2019-10-22 ENCOUNTER — Telehealth: Payer: Medicare Other

## 2019-10-22 NOTE — Chronic Care Management (AMB) (Signed)
  Chronic Care Management   Note  10/22/2019 Name: Caitlyn May MRN: [id number] DOB: 1955-08-15  Caitlyn May is a 64 y.o. year old female who is a primary care patient of Volney American, [place]. The CCM team was consulted for assistance with chronic disease management and care coordination needs.    Attempted to contact patient for medication management review. Left HIPAA compliant message for patient to return my call at their convenience.   Plan: - Will collaborate with Care Guide to outreach to schedule follow up with me  [name], PharmD, [place] [phone]

## 2019-10-23 ENCOUNTER — Telehealth: Payer: Self-pay | Admitting: Family Medicine

## 2019-10-23 NOTE — Chronic Care Management (AMB) (Signed)
  Care Management   Note  10/23/2019 Name: Shakeya Raudales MRN: [id number] DOB: 13-Dec-1955  Krishma Janikowski is a 64 y.o. year old female who is a primary care patient of Volney American, [facility] and is actively engaged with the care management team. I reached out to [facility] by phone today to assist with scheduling a follow up visit with the Pharmacist  Follow up plan: Unsuccessful telephone outreach attempt made. A HIPPA compliant phone message was left for the patient providing contact information and requesting a return call.  The care management team will reach out to the patient again over the next 7 days.  If patient returns call to provider office, please advise to call [place] at [place], [place], [place], [name] [address] Direct Dial: [phone] [email or web address] Website: [email or web address]

## 2019-10-24 NOTE — Chronic Care Management (AMB) (Signed)
  Care Management   Note  10/24/2019 Name: Caitlyn May MRN: [id number] DOB: [birth year]  Caitlyn May is a 64 y.o. year old female who is a primary care patient of Volney American, [facility]hal Coria and is actively engaged with the care management team. I reached out to [name] by phone today to assist with scheduling a follow up visit with the Pharmacist  Follow up plan: Second Unsuccessful telephone outreach attempt made. A HIPPA compliant phone message was left for the patient providing contact information and requesting a return call.  The care management team will reach out to the patient again over the next 7 days.  If patient returns call to provider office, please advise to call [place]  at [place], [place], [place], [place] [address] Direct Dial: [phone] [email or web address]: [email or web address]

## 2019-10-29 ENCOUNTER — Other Ambulatory Visit: Payer: Self-pay

## 2019-10-29 ENCOUNTER — Ambulatory Visit
Admission: RE | Admit: 2019-10-29 | Discharge: 2019-10-29 | Disposition: A | Discharge status: Home / Self Care | Payer: Medicare HMO | Source: Ambulatory Visit | Attending: Gastroenterology | Admitting: Gastroenterology

## 2019-10-29 DIAGNOSIS — R1084 Generalized abdominal pain: Secondary | ICD-10-CM | POA: Diagnosis not present

## 2019-10-29 DIAGNOSIS — R634 Abnormal weight loss: Secondary | ICD-10-CM | POA: Insufficient documentation

## 2019-10-29 DIAGNOSIS — R112 Nausea with vomiting, unspecified: Secondary | ICD-10-CM | POA: Insufficient documentation

## 2019-10-29 DIAGNOSIS — R194 Change in bowel habit: Secondary | ICD-10-CM | POA: Diagnosis not present

## 2019-10-29 DIAGNOSIS — R1110 Vomiting, unspecified: Secondary | ICD-10-CM | POA: Diagnosis not present

## 2019-10-29 MED ORDER — IOHEXOL 300 MG/ML  SOLN
75.0000 mL | Freq: Once | INTRAMUSCULAR | Status: AC | PRN
  Administered 2019-10-29: 75 mL via INTRAVENOUS

## 2019-10-30 NOTE — Chronic Care Management (AMB) (Signed)
  Care Management   Note  10/30/2019 Name: Caitlyn May MRN: [id number] DOB: [birth year]  Caitlyn May is a 64 y.o. year old female who is a primary care patient of Volney American, [facility] Cor[facility] and is actively engaged with the care management team. I reached out to [facility] by phone today to assist with scheduling a follow up visit with the Pharmacist  Follow up plan: Unable to make contact on outreach attempts x 3. PCP Merrie Roof PA-C and [name] Alvia Grove D  notified via routed documentation in medical record.   Noreene Larsson, [place], [place], [place] [address] Direct Dial: [phone] [email or web address] Website: [email or web address]

## 2019-11-05 ENCOUNTER — Other Ambulatory Visit: Payer: Self-pay | Admitting: Family Medicine

## 2019-11-05 NOTE — Telephone Encounter (Signed)
Requested medication (s) are due for refill today: no  Requested medication (s) are on the active medication list: yes  Last refill:  10/11/2019  Future visit scheduled:no  Notes to clinic:  this refill cannot be delegated    Requested Prescriptions  Pending Prescriptions Disp Refills   clonazePAM (KLONOPIN) 0.5 MG tablet [Pharmacy Med Name: clonazepam 0.5 mg tablet] 50 tablet 1    Sig: TAKE ONE TABLET BY MOUTH TWICE DAILY AS NEEDED FOR ANXIETY      Not Delegated - Psychiatry:  Anxiolytics/Hypnotics Failed - 11/05/2019  8:00 AM      Failed - This refill cannot be delegated      Failed - Urine Drug Screen completed in last 360 days.      Passed - Valid encounter within last 6 months    Recent Outpatient Visits           3 weeks ago Non-intractable vomiting with nausea, unspecified vomiting type   [place], [place], [place]   1 month ago SOB (shortness of breath)   [facility] Volney American, [place]   1 month ago [place], [place], [place]   2 months ago Unintended weight loss   [facility], [place], [place]   3 months ago Acute rhinosinusitis   [place], NP       Future Appointments             In 7 months [facility], [place]

## 2019-11-05 NOTE — Telephone Encounter (Signed)
Routing to provider  

## 2019-11-06 ENCOUNTER — Other Ambulatory Visit: Payer: Self-pay | Admitting: Family Medicine

## 2019-11-06 NOTE — Telephone Encounter (Signed)
Routing to provider  

## 2019-11-06 NOTE — Telephone Encounter (Signed)
Requested medication (s) are due for refill today- 6/14  Requested medication (s) are on the active medication list - yes  Future visit scheduled -yes  Last refill: 09/16/19 1 RF  Notes to clinic: Request for RF non delegated Rx  Requested Prescriptions  Pending Prescriptions Disp Refills   clonazePAM (KLONOPIN) 0.5 MG tablet [Pharmacy Med Name: clonazepam 0.5 mg tablet] 50 tablet 1    Sig: TAKE ONE TABLET BY MOUTH TWICE DAILY AS NEEDED FOR ANXIETY      Not Delegated - Psychiatry:  Anxiolytics/Hypnotics Failed - 11/06/2019  9:23 AM      Failed - This refill cannot be delegated      Failed - Urine Drug Screen completed in last 360 days.      Passed - Valid encounter within last 6 months    Recent Outpatient Visits           3 weeks ago Non-intractable vomiting with nausea, unspecified vomiting type   [facility], [place], [place]   1 month ago SOB (shortness of breath)   [place], Lilia [place], [place]   1 month ago [place], [place], [place]   2 months ago Unintended weight loss   [facility], [place], [place]   3 months ago Acute rhinosinusitis   [place], NP       Future Appointments             In 7 months [place], [facility]                 Requested Prescriptions  Pending Prescriptions Disp Refills   clonazePAM (KLONOPIN) 0.5 MG tablet [Pharmacy Med Name: clonazepam 0.5 mg tablet] 50 tablet 1    Sig: TAKE ONE TABLET BY MOUTH TWICE DAILY AS NEEDED FOR ANXIETY      Not Delegated - Psychiatry:  Anxiolytics/Hypnotics Failed - 11/06/2019  9:23 AM      Failed - This refill cannot be delegated      Failed - Urine Drug Screen completed in last 360 days.      Passed - Valid encounter within last 6 months    Recent Outpatient Visits           3 weeks ago Non-intractable vomiting with nausea, unspecified vomiting  type   [place], [place], [place]   1 month ago SOB (shortness of breath)   [facility] Volney American, [place]   1 month ago [place], [place], [place]   2 months ago Unintended weight loss   [facility], [place], [place]   3 months ago Acute rhinosinusitis   [place], NP       Future Appointments             In 7 months [facility], [place]

## 2019-11-10 ENCOUNTER — Other Ambulatory Visit: Payer: Self-pay | Admitting: Family Medicine

## 2019-11-11 ENCOUNTER — Telehealth: Payer: Self-pay | Admitting: Pharmacist

## 2019-11-11 ENCOUNTER — Other Ambulatory Visit: Payer: Self-pay | Admitting: Family Medicine

## 2019-11-11 ENCOUNTER — Telehealth: Payer: Self-pay | Admitting: Family Medicine

## 2019-11-11 NOTE — Telephone Encounter (Signed)
Patient last seen 10/14/19.

## 2019-11-11 NOTE — Chronic Care Management (AMB) (Signed)
  Chronic Care Management   Note  11/11/2019 Name: Caitlyn May MRN: [id number] DOB: Apr 27, 1956  Caitlyn May is a 64 y.o. year old female who is a primary care patient of Volney American, [facility] Coria and is actively engaged with the care management team. I reached out to [facility] by phone today to assist with scheduling a follow up visit with the Pharmacist.  Follow up plan: Unsuccessful telephone outreach attempt made. A HIPPA compliant phone message was left for the patient providing contact information and requesting a return call. The care management team will reach out to the patient again over the next 7 days. If patient returns call to provider office, please advise to call [place] at [phone].  [place], [place] [address] Direct Dial: [phone] [email or web address] Website: [email or web address]

## 2019-11-11 NOTE — Telephone Encounter (Signed)
Patient Caitlyn May, calling back to speak with Erline Levine regarding setting up an appointment with Pharmacist.

## 2019-11-11 NOTE — Telephone Encounter (Signed)
Caitlyn May,  can you please outreach this patient to see about getting on my schedule? Thanks!  Copied from [place] [id number]. Topic: General - Inquiry >> Nov 07, 2019  4:01 PM Richardo Priest, NT wrote: Reason for CRM: Patient called in inquiring if she can speak with Amber in office to discuss needing assistance to cover medications. Please advise. >> Nov 11, 2019  8:12 AM Don Perking M wrote: Previous documentation from [employer]

## 2019-11-12 ENCOUNTER — Ambulatory Visit: Payer: Self-pay | Admitting: Gastroenterology

## 2019-11-15 ENCOUNTER — Telehealth: Payer: Self-pay | Admitting: Family Medicine

## 2019-11-15 NOTE — Chronic Care Management (AMB) (Signed)
  Chronic Care Management   Note  11/15/2019 Name: Caitlyn May MRN: [id number] DOB: 1955/09/17  Caitlyn May is a 64 y.o. year old female who is a primary care patient of Volney American, [facility]hal Coria and is actively engaged with the care management team. I reached out to [name] by phone today to assist with scheduling an initial visit with the Pharmacist.   Follow up plan: Telephone appointment with care management team member scheduled for:11/27/2019  [place], [place] [facility]  [place], [place] [address] Direct Dial: [email or web address] Website: [email or web address]

## 2019-11-15 NOTE — Chronic Care Management (AMB) (Signed)
  Chronic Care Management   Note  11/15/2019 Name: Caitlyn May MRN: [id number] DOB: 12/26/1955  Caitlyn May is a 64 y.o. year old female who is a primary care patient of Volney American, [facility] Coria and is actively engaged with the care management team. I reached out to [name] by phone today to assist with re-scheduling an initial visit with the Pharmacist  Follow up plan: Telephone appointment with care management team member scheduled for:11/26/2019.  [place], [place] [address] Direct Dial: [phone] [email or web address]: [email or web address]

## 2019-11-26 ENCOUNTER — Telehealth: Payer: Medicare HMO

## 2019-11-26 ENCOUNTER — Ambulatory Visit: Payer: Self-pay | Admitting: Pharmacist

## 2019-11-26 NOTE — Chronic Care Management (AMB) (Signed)
°  Chronic Care Management   Note  11/26/2019 Name: Caitlyn May MRN: [id number] DOB: [birth year]  Caitlyn May is a 64 y.o. year old female who is a primary care patient of Volney American, [place]. The CCM team was consulted for assistance with chronic disease management and care coordination needs.    Attempted to contact patient for medication management review. Left HIPAA compliant message for patient to return my call at their convenience.   Plan: - Will collaborate with Care Guide to outreach to schedule follow up with me  [name], PharmD, [place] [phone]

## 2019-11-27 ENCOUNTER — Telehealth: Payer: Self-pay | Admitting: Family Medicine

## 2019-11-27 ENCOUNTER — Telehealth: Payer: Medicare HMO

## 2019-11-27 NOTE — Chronic Care Management (AMB) (Signed)
  Chronic Care Management   Note  11/27/2019 Name: Caitlyn May MRN: [id number] DOB: 19-Jul-1955  Caitlyn May is a 64 y.o. year old female who is a primary care patient of Volney American, [facility] Coria and is actively engaged with the care management team. I reached out to [facility] by phone today to assist with re-scheduling an initial visit with the Pharmacist.  Follow up plan: Unsuccessful telephone outreach attempt made. A HIPPA compliant phone message was left for the patient providing contact information and requesting a return call. The care management team will reach out to the patient again over the next 7 days. If patient returns call to provider office, please advise to call [place] at [place], [place]  [place], [place] [address] Direct Dial: [name] Website: [email or web address]

## 2019-11-27 NOTE — Progress Notes (Signed)
.  Can you help me attempt to try to get in touch with patient and reschedule my initial call for medication cost concerns? Can you see about 4:15 next Tues or Weds? I want to try to get a hold of her sooner rather than later, if we can. Thanks!

## 2019-12-12 NOTE — Chronic Care Management (AMB) (Signed)
  Chronic Care Management   Note  12/12/2019 Name: Taqwa Deem MRN: [id number] DOB: June 11, 1955  Harold Mattes is a 64 y.o. year old female who is a primary care patient of Volney American, [facility]hal Coria and is actively engaged with the care management team. I reached out to [facility] by phone today to assist with re-scheduling an initial visit with the Pharmacist.  Follow up plan: Unsuccessful telephone outreach attempt made. Unable to make contact on outreach attempts x 3. PCP Volney American and Pharmacist Alphonzo Severance notified via routed documentation in medical record. The care management team is available to follow up with the patient after provider conversation with the patient regarding recommendation for care management engagement and subsequent re-referral to the care management team.   [place], [place], [place] [address] Direct Dial: [email or web address] Website: [email or web address]

## 2019-12-12 NOTE — Chronic Care Management (AMB) (Signed)
  Chronic Care Management   Note  12/12/2019 Name: Konica Stankowski MRN: [id number] DOB: [birth year]  Tamzin Bertling is a 64 y.o. year old female who is a primary care patient of Volney American, [facility]hal Coria and is actively engaged with the care management team. I reached out to [facility] by phone today to assist with re-scheduling an initial visit with the Pharmacist.  Follow up plan: Second unsuccessful telephone outreach attempt made.The care management team will reach out to the patient again over the next 7 days. If patient returns call to provider office, please advise to call [place] at [phone].  [place], [place] [address] Direct Dial: [phone] [email or web address] Website: [email or web address]

## 2019-12-13 ENCOUNTER — Other Ambulatory Visit: Payer: Self-pay | Admitting: Family Medicine

## 2019-12-18 ENCOUNTER — Ambulatory Visit: Payer: Medicare Other

## 2019-12-18 NOTE — Chronic Care Management (AMB) (Signed)
  Care Management   Follow Up Note   12/18/2019 Name: Caitlyn May MRN: [id number] DOB: 1955-11-30  Referred by: Volney American, PA-C Reason for referral : [place] is a 64 y.o. year old female who is a primary care patient of Volney American, [place]. The care management team was consulted for assistance with care management and care coordination needs.    Review of patient status, including review of consultants reports, relevant laboratory and other test results, and collaboration with appropriate care team members and the patient's provider was performed as part of comprehensive patient evaluation and provision of chronic care management services.    LCSW completed CCM outreach attempt today but was unable to reach patient successfully. A HIPPA compliant voice message was left encouraging patient to return call once available. LCSW rescheduled CCM SW appointment as well.  A HIPPA compliant phone message was left for the patient providing contact information and requesting a return call.   Eula Fried, BSW, MSW, [facility] Care Management [email or web address] Phone: [phone]

## 2020-01-15 ENCOUNTER — Ambulatory Visit: Payer: Self-pay

## 2020-01-15 NOTE — Telephone Encounter (Signed)
Pt.notified

## 2020-01-15 NOTE — Telephone Encounter (Signed)
Patient called with questions about the COVID-19 vaccine.  She is not having symptoms and had no know exposure to COVID-19 but is considering getting the vaccine.  She has questions about variants and the vaccine. Patient was advised that vaccine is effective with both the COVID-19 and variants. She was informed that the vaccine Pfizer and [place] require a series of 2 vaccines . You are protected 2 weeks after the 2nd dose. J/J vaccine is just one dose but still requires the 2 week wait for effectiveness. Patient was questioning why the news states that people who are vaccinated still get the variant. She was told we are still learning.  Vaccine is recommended for age 65 and older and everyone with pre exiting conditions. Patient verbalized understanding and would still like to speak with her PCP. Note will be routed  Answer Assessment - Initial Assessment Questions 1. MAIN CONCERN OR SYMPTOM:  "What is your main concern right now?" "What question do you have?" "What's the main symptom you're worried about?" (e.g., fever, pain, redness, swelling)     Unknown about vaccine 2. VACCINE: "What vaccination did you receive?" "Is this your first or second shot?" (e.g., none; AstraZeneca, J&J, [place], other)     Needs vaccine 3. SYMPTOM ONSET: "When did the N/A begin?" (e.g., not relevant; hours, days)      No symptoms 4. SYMPTOM SEVERITY: "How bad is it?"     N/A 5. FEVER: "Is there a fever?" If so, ask: "What is it, how was it measured, and when did it start?"     N/A 6. PAST REACTIONS: "Have you reacted to immunizations before?" If so, ask: "What happened?"    no 7. OTHER SYMPTOMS: "Do you have any other symptoms?"    N/A  Protocols used: CORONAVIRUS (COVID-19) VACCINE QUESTIONS AND REACTIONS-A-AH

## 2020-01-15 NOTE — Telephone Encounter (Signed)
It is recommended as she is high risk and I do not see specific contraindications in her case

## 2020-01-18 ENCOUNTER — Other Ambulatory Visit: Payer: Self-pay | Admitting: Family Medicine

## 2020-01-18 NOTE — Telephone Encounter (Signed)
Requested medication (s) are due for refill today: yes  Requested medication (s) are on the active medication list: yes  Last refill:  11/13/19  Future visit scheduled: no  Notes to clinic:  Med not delegated to NT to RF   Requested Prescriptions  Pending Prescriptions Disp Refills   clonazePAM (KLONOPIN) 0.5 MG tablet [Pharmacy Med Name: clonazepam 0.5 mg tablet] 50 tablet     Sig: TAKE ONE TABLET BY MOUTH TWICE DAILY AS NEEDED FOR ANXIETY      Not Delegated - Psychiatry:  Anxiolytics/Hypnotics Failed - 01/18/2020  9:35 AM      Failed - This refill cannot be delegated      Failed - Urine Drug Screen completed in last 360 days.      Passed - Valid encounter within last 6 months    Recent Outpatient Visits           3 months ago Non-intractable vomiting with nausea, unspecified vomiting type   [place], [place], [place]   3 months ago SOB (shortness of breath)   [facility] Merrie Roof [place], [place]   4 months ago [place], [place], [place]   5 months ago Unintended weight loss   [place], [place], [place]   5 months ago Acute rhinosinusitis   [place], Jessica A, NP       Future Appointments             In 4 months [place], [facility]              Signed Prescriptions Disp Refills   megestrol (MEGACE) 40 MG tablet 120 tablet 1    Sig: TAKE ONE TABLET BY MOUTH FOUR TIMES DAILY AS NEEDED      OB/GYN:  Progestins Passed - 01/18/2020  9:35 AM      Passed - Valid encounter within last 12 months    Recent Outpatient Visits           3 months ago Non-intractable vomiting with nausea, unspecified vomiting type   [place], [place]   3 months ago SOB (shortness of breath)   [place], [place], [place]   4 months ago [place], [place]   5 months ago Unintended weight loss   [place], [place], [place]   5 months ago Acute rhinosinusitis   [place] Eulogio Bear, NP       Future Appointments             In 4 months [facility], [place]

## 2020-01-18 NOTE — Telephone Encounter (Signed)
Requested Prescriptions  Pending Prescriptions Disp Refills   megestrol (MEGACE) 40 MG tablet [Pharmacy Med Name: megestrol 40 mg tablet] 120 tablet 1    Sig: TAKE ONE TABLET BY MOUTH FOUR TIMES DAILY AS NEEDED     OB/GYN:  Progestins Passed - 01/18/2020  9:35 AM      Passed - Valid encounter within last 12 months    Recent Outpatient Visits          3 months ago Non-intractable vomiting with nausea, unspecified vomiting type   [place], [place], [place]   3 months ago SOB (shortness of breath)   [facility] Merrie Roof [place], [place]   4 months ago [place], [place], [place]   5 months ago Unintended weight loss   [place], [place], [place]   5 months ago Acute rhinosinusitis   [place], Jessica A, NP      Future Appointments            In 4 months [place], [facility]             clonazePAM (KLONOPIN) 0.5 MG tablet [Pharmacy Med Name: clonazepam 0.5 mg tablet] 50 tablet     Sig: TAKE ONE TABLET BY MOUTH TWICE DAILY AS NEEDED FOR ANXIETY     Not Delegated - Psychiatry:  Anxiolytics/Hypnotics Failed - 01/18/2020  9:35 AM      Failed - This refill cannot be delegated      Failed - Urine Drug Screen completed in last 360 days.      Passed - Valid encounter within last 6 months    Recent Outpatient Visits          3 months ago Non-intractable vomiting with nausea, unspecified vomiting type   [place], [place]   3 months ago SOB (shortness of breath)   [facility] Merrie Roof [place], [place]   4 months ago [place], [place]   5 months ago Unintended weight loss   [place], [place], [place]   5 months ago Acute rhinosinusitis   [place] Eulogio Bear, NP      Future Appointments             In 4 months [facility], [place]

## 2020-01-20 NOTE — Telephone Encounter (Signed)
Pt called about refill status for Clonazepam/ Please advise

## 2020-02-21 ENCOUNTER — Other Ambulatory Visit: Payer: Self-pay | Admitting: Family Medicine

## 2020-02-21 NOTE — Telephone Encounter (Signed)
Requested medication (s) are due for refill today: yes  Requested medication (s) are on the active medication list: yes  Last refill:  01/20/20  #50  0 refills  Future visit scheduled: yes  Notes to clinic:  Last filled by Merrie Roof.  Medication not delegated    Requested Prescriptions  Pending Prescriptions Disp Refills   clonazePAM (KLONOPIN) 0.5 MG tablet [Pharmacy Med Name: clonazepam 0.5 mg tablet] 50 tablet 0    Sig: TAKE ONE TABLET BY MOUTH TWICE DAILY AS NEEDED FOR ANXIETY      Not Delegated - Psychiatry:  Anxiolytics/Hypnotics Failed - 02/21/2020  9:41 AM      Failed - This refill cannot be delegated      Failed - Urine Drug Screen completed in last 360 days.      Passed - Valid encounter within last 6 months    Recent Outpatient Visits           4 months ago Non-intractable vomiting with nausea, unspecified vomiting type   [place], [place]   4 months ago SOB (shortness of breath)   [facility] Merrie Roof [place], [place]   5 months ago [place], [place]   6 months ago Unintended weight loss   [place], [place], [place]   7 months ago Acute rhinosinusitis   [place] Eulogio Bear, NP       Future Appointments             In 3 months [facility], [place]

## 2020-02-21 NOTE — Telephone Encounter (Signed)
Requested medication (s) are due for refill today: yes  Requested medication (s) are on the active medication list:yes  Last refill: 01/18/20  # 120  0refills  Future visit scheduled: no  Notes to clinic:  Patient of Merrie Roof PA    Requested Prescriptions  Pending Prescriptions Disp Refills   megestrol (MEGACE) 40 MG tablet [Pharmacy Med Name: megestrol 40 mg tablet] 120 tablet 1    Sig: TAKE ONE TABLET BY MOUTH FOUR TIMES DAILY AS NEEDED      OB/GYN:  Progestins Passed - 02/21/2020  4:13 PM      Passed - Valid encounter within last 12 months    Recent Outpatient Visits           4 months ago Non-intractable vomiting with nausea, unspecified vomiting type   [place], [place]   4 months ago SOB (shortness of breath)   [place], [place], [place]   5 months ago [place], [place]   6 months ago Unintended weight loss   [place], [place], [place]   7 months ago Acute rhinosinusitis   [place] Eulogio Bear, NP       Future Appointments             In 3 months [facility], [place]

## 2020-02-21 NOTE — Telephone Encounter (Signed)
Routing to provider  

## 2020-02-23 NOTE — Telephone Encounter (Signed)
appt

## 2020-02-24 ENCOUNTER — Other Ambulatory Visit: Payer: Self-pay | Admitting: Family Medicine

## 2020-02-24 MED ORDER — CLONAZEPAM 0.5 MG PO TABS
0.5000 mg | ORAL_TABLET | Freq: Two times a day (BID) | ORAL | 0 refills | Status: DC | PRN
Start: 2020-02-24 — End: 2020-03-23

## 2020-02-24 NOTE — Addendum Note (Signed)
Addended by: Valerie Roys on: 02/24/2020 05:27 PM   Modules accepted: Orders

## 2020-02-24 NOTE — Telephone Encounter (Signed)
Please see if we can get her in sooner

## 2020-02-24 NOTE — Telephone Encounter (Signed)
Requested medication (s) are due for refill today: yes  Requested medication (s) are on the active medication list: yes  Last refill: 01/18/20  #120  1 refill  ( patient to take 4x per day as needed)  Future visit scheduled: yes social worker appointment  Notes to clinic:  Merrie Roof patient. Not sure who I can order under. She should be down to 0 refills    Requested Prescriptions  Pending Prescriptions Disp Refills   megestrol (MEGACE) 40 MG tablet [Pharmacy Med Name: megestrol 40 mg tablet] 120 tablet 1    Sig: TAKE ONE TABLET BY MOUTH FOUR TIMES DAILY AS NEEDED      OB/GYN:  Progestins Passed - 02/24/2020  1:39 PM      Passed - Valid encounter within last 12 months    Recent Outpatient Visits           4 months ago Non-intractable vomiting with nausea, unspecified vomiting type   [place], [place]   4 months ago SOB (shortness of breath)   [facility], [place], [place]   5 months ago [place], [place]   6 months ago Unintended weight loss   [place], [place], [place]   7 months ago Acute rhinosinusitis   [place] Eulogio Bear, NP       Future Appointments             In 3 months [facility], [place]

## 2020-02-24 NOTE — Telephone Encounter (Signed)
Called patient and scheduled her to 03/19/20. Pt states she has been out of clonazepam since yesterday. Pt requesting a refill to make it to the apt. Please advise

## 2020-02-24 NOTE — Telephone Encounter (Signed)
Routing to provider  

## 2020-02-25 ENCOUNTER — Telehealth: Payer: Self-pay

## 2020-02-25 NOTE — Telephone Encounter (Signed)
Called patient to reschedule her 03/19/20 apt to sooner with any other provider. LVM for patient to return my call to reschedule.

## 2020-02-25 NOTE — Telephone Encounter (Signed)
Unable to lvm to see if she wanted to be seen sooner, Per schedule looks like this may be the soonest apt.

## 2020-02-26 ENCOUNTER — Ambulatory Visit: Payer: Medicare Other

## 2020-02-26 NOTE — Telephone Encounter (Signed)
Pt verbalized understanding and confirmed understanding.  

## 2020-02-26 NOTE — Telephone Encounter (Signed)
Rescheduled

## 2020-02-27 NOTE — Chronic Care Management (AMB) (Signed)
°  Care Management   Follow Up Note   02/27/2020 Name: Ralonda Tartt MRN: [id number] DOB: 1955-06-03  Referred by: Volney American, PA-C Reason for referral : [place] is a 64 y.o. year old female who is a primary care patient of Volney American, [place]. The care management team was consulted for assistance with care management and care coordination needs.    Review of patient status, including review of consultants reports, relevant laboratory and other test results, and collaboration with appropriate care team members and the patient's provider was performed as part of comprehensive patient evaluation and provision of chronic care management services.    LCSW completed CCM outreach attempt today but was unable to reach patient successfully. A HIPPA compliant voice message was left encouraging patient to return call once available. LCSW will reschedule CCM SW appointment with patient as well.  A HIPAA compliant phone message was left for the patient providing contact information and requesting a return call.   Eula Fried, BSW, MSW, [facility] Care Management [email or web address] Phone: [phone]

## 2020-03-04 NOTE — Progress Notes (Deleted)
There were no vitals taken for this visit.   Subjective:    Patient ID: Caitlyn May, female    DOB: 06-Apr-1956, 64 y.o.   MRN: [id number]  HPI: Caitlyn May is a 64 y.o. female  No chief complaint on file.  WEIGHT GAIN Duration:  Previous attempts at weight loss: {Blank VZCHYI:50277::"AJO","IN"} Complications of obesity:  Peak weight:  Weight loss goal:  Weight loss to date:  Requesting obesity pharmacotherapy: {Blank single:19197::"yes","no"} Current weight loss supplements/medications: {Blank single:19197::"yes","no"} Previous weight loss supplements/meds: {Blank single:19197::"yes","no"} Calories:   HYPERTENSION / HYPERLIPIDEMIA Satisfied with current treatment? {Blank single:19197::"yes","no"} Duration of hypertension: {Blank single:19197::"chronic","months","years"} BP monitoring frequency: {Blank single:19197::"not checking","rarely","daily","weekly","monthly","a few times a day","a few times a week","a few times a month"} BP range:  BP medication side effects: {Blank single:19197::"yes","no"} Past BP meds: {Blank OMVEHMCN:47096::"GEZM","OQHUTMLYYT","KPTWSFKCLE/XNTZGYFVCB","SWHQPRFF","MBWGYKZLDJ","TTSVXBLTJQ/ZESP","QZRAQTMAUQ (bystolic)","carvedilol","chlorthalidone","clonidine","diltiazem","exforge HCT","HCTZ","irbesartan (avapro)","labetalol","lisinopril","lisinopril-HCTZ","losartan (cozaar)","methyldopa","nifedipine","olmesartan (benicar)","olmesartan-HCTZ","quinapril","ramipril","spironalactone","tekturna","valsartan","valsartan-HCTZ","verapamil"} Duration of hyperlipidemia: {Blank single:19197::"chronic","months","years"} Cholesterol medication side effects: {Blank single:19197::"yes","no"} Cholesterol supplements: {Blank multiple:19196::"none","fish oil","niacin","red yeast rice"} Past cholesterol medications: {Blank multiple:19196::"none","atorvastain (lipitor)","lovastatin (mevacor)","pravastatin (pravachol)","rosuvastatin (crestor)","simvastatin  (zocor)","vytorin","fenofibrate (tricor)","gemfibrozil","ezetimide (zetia)","niaspan","lovaza"} Medication compliance: {Blank single:19197::"excellent compliance","good compliance","fair compliance","poor compliance"} Aspirin: {Blank single:19197::"yes","no"} Recent stressors: {Blank single:19197::"yes","no"} Recurrent headaches: {Blank single:19197::"yes","no"} Visual changes: {Blank single:19197::"yes","no"} Palpitations: {Blank single:19197::"yes","no"} Dyspnea: {Blank single:19197::"yes","no"} Chest pain: {Blank single:19197::"yes","no"} Lower extremity edema: {Blank single:19197::"yes","no"} Dizzy/lightheaded: {Blank single:19197::"yes","no"}  DEPRESSION Mood status: {Blank single:19197::"controlled","uncontrolled","better","worse","exacerbated","stable"} Satisfied with current treatment?: {Blank single:19197::"yes","no"} Symptom severity: {Blank single:19197::"mild","moderate","severe"}  Duration of current treatment : {Blank single:19197::"chronic","months","years"} Side effects: {Blank single:19197::"yes","no"} Medication compliance: {Blank single:19197::"excellent compliance","good compliance","fair compliance","poor compliance"} Psychotherapy/counseling: {Blank single:19197::"yes","no"} {Blank single:19197::"current","in the past"} Previous psychiatric medications: {Blank multiple:19196::"abilify","amitryptiline","buspar","celexa","cymbalta","depakote","effexor","lamictal","lexapro","lithium","nortryptiline","paxil","prozac","pristiq (desvenlafaxine","seroquel","wellbutrin","zoloft","zyprexa"} Depressed mood: {Blank single:19197::"yes","no"} Anxious mood: {Blank single:19197::"yes","no"} Anhedonia: {Blank single:19197::"yes","no"} Significant weight loss or gain: {Blank single:19197::"yes","no"} Insomnia: {Blank single:19197::"yes","no"} {Blank single:19197::"hard to fall asleep","hard to stay asleep"} Fatigue: {Blank single:19197::"yes","no"} Feelings of worthlessness or guilt:  {Blank single:19197::"yes","no"} Impaired concentration/indecisiveness: {Blank single:19197::"yes","no"} Suicidal ideations: {Blank single:19197::"yes","no"} Hopelessness: {Blank single:19197::"yes","no"} Crying spells: {Blank single:19197::"yes","no"} Depression screen [facility] 2/9 09/16/2019 06/10/2019 06/04/2019 09/10/2018 05/03/2018  Decreased Interest 2 0 3 1 3   Down, Depressed, Hopeless 2 3 2 1 3   PHQ - 2 Score 4 3 5 2 6   Altered sleeping 2 1 1  0 1  Tired, decreased energy 3 1 3 3 3   Change in appetite 3 1 1  0 1  Feeling bad or failure about yourself  1 0 1 0 0  Trouble concentrating 2 0 1 0 2  Moving slowly or fidgety/restless 1 0 2 0 0  Suicidal thoughts 0 0 0 0 0  PHQ-9 Score 16 6 14 5 13   Difficult doing work/chores Not difficult at all Not difficult at all Somewhat difficult Not difficult at all Somewhat difficult  Some recent data might be hidden   OSTEOPOROSIS  Satisfied with current treatment?: {Blank single:19197::"yes","no"} Medication side effects: {Blank single:19197::"yes","no"} Medication compliance: {Blank single:19197::"excellent compliance","good compliance","fair compliance","poor compliance"} Past osteoporosis medications/treatments:  Adequate calcium & vitamin D: {Blank single:19197::"yes","no"} Intolerance to bisphosphonates:{Blank single:19197::"yes","no"} Weight bearing exercises: {Blank single:19197::"yes","no"}    Allergies  Allergen Reactions  . Accupril [Quinapril Hcl]   . Avelox [Moxifloxacin]   . Ciprofloxacin   . Hctz [Hydrochlorothiazide] Other (See Comments)    Leg swelling  . Penicillins   . Sulfa Antibiotics    Outpatient Encounter Medications as of 03/05/2020  Medication Sig  . amLODipine (NORVASC) 5 MG tablet Take 1 tablet (5 mg total) by mouth daily.  Marland Kitchen atorvastatin (LIPITOR) 40 MG tablet TAKE ONE TABLET BY MOUTH ONCE DAILY  . benazepril (LOTENSIN) 40 MG tablet TAKE ONE TABLET BY MOUTH ONCE DAILY  .  cetirizine (ZYRTEC) 10 MG tablet Take 1  tablet (10 mg total) by mouth daily.  . clonazePAM (KLONOPIN) 0.5 MG tablet Take 1 tablet (0.5 mg total) by mouth 2 (two) times daily as needed. for anxiety  . cyclobenzaprine (FLEXERIL) 5 MG tablet TAKE ONE TABLET BY MOUTH ONCE DAILY AS NEEDED FOR MUSCLE SPASMS  . ipratropium-albuterol (DUONEB) 0.5-2.5 (3) MG/3ML SOLN Take 3 mLs by nebulization every 4 (four) hours as needed.  . megestrol (MEGACE) 40 MG tablet TAKE ONE TABLET BY MOUTH FOUR TIMES DAILY AS NEEDED  . meloxicam (MOBIC) 7.5 MG tablet TAKE ONE TABLET BY MOUTH ONCE DAILY  . metoprolol succinate (TOPROL-XL) 50 MG 24 hr tablet TAKE ONE TABLET BY MOUTH ONCE DAILY WITH A MEAL  . montelukast (SINGULAIR) 10 MG tablet Take 1 tablet (10 mg total) by mouth at bedtime.  . ondansetron (ZOFRAN ODT) 8 MG disintegrating tablet Take 1 tablet (8 mg total) by mouth every 8 (eight) hours as needed for nausea or vomiting.  . ranitidine (ZANTAC) 75 MG tablet Take 1 tablet (75 mg total) by mouth 2 (two) times daily.  . sertraline (ZOLOFT) 100 MG tablet Take 1 tablet (100 mg total) by mouth daily.  Marland Kitchen triamcinolone (NASACORT AQ) 55 MCG/ACT AERO nasal inhaler Place 2 sprays into the nose daily.  Marland Kitchen umeclidinium-vilanterol (ANORO ELLIPTA) 62.5-25 MCG/INH AEPB Inhale 1 puff into the lungs daily.  . VENTOLIN HFA 108 (90 Base) MCG/ACT inhaler INHALE 2 PUFFS BY MOUTH INTO THE LUNGS EVERY 6 HOURS AS NEEDED FOR WHEEZING OR SHORTNESS OF BREATH   No facility-administered encounter medications on file as of 03/05/2020.   Patient Active Problem List   Diagnosis Date Noted  . COPD (chronic obstructive pulmonary disease) ([place]) 08/20/2019  . Acute rhinosinusitis 07/23/2019  . Anxiety 04/19/2018  . Syncope 12/11/2017  . Allergic rhinitis 09/28/2017  . Asthma due to environmental allergies 08/22/2017  . Cervical spine tumor 08/22/2017  . Weight loss 07/18/2017  . Advanced care planning/counseling discussion 04/14/2017  . Vitamin D deficiency 02/07/2017  . Disturbance  of skin sensation 11/29/2016  . Chronic shoulder pain (Location of Tertiary source of pain) (Bilateral) (R>L) 11/29/2016  . Chronic upper extremity pain (Bilateral) (R>L) 11/29/2016  . Chronic cervical radicular pain (Bilateral) (R>L) 11/29/2016  . Chronic hip pain (Bilateral) (R>L) 11/29/2016  . Osteoarthritis of hip (Bilateral) 11/29/2016  . Chronic sacroiliac joint pain (Bilateral) 11/29/2016  . Lumbar facet syndrome (Right) 11/29/2016  . Long term prescription benzodiazepine use 11/28/2016  . Chronic pain syndrome 11/28/2016  . Long term (current) use of opiate analgesic 11/28/2016  . Long term prescription opiate use 11/28/2016  . Opiate use 11/28/2016  . Failed back surgical syndrome 11/28/2016  . Chronic low back pain (Location of Primary Source of Pain) (Bilateral) (R>L) 11/28/2016  . Lumbar radiculitis (Right) 11/28/2016  . Chronic lower extremity pain (Bilateral) (R>L) 11/28/2016  . Chronic lumbar radicular pain (Bilateral) (R>L) 11/28/2016  . Chronic neck pain (Location of Secondary source of pain) (Bilateral) (R>L) 11/28/2016  . DDD (degenerative disc disease), lumbar 11/28/2016  . DDD (degenerative disc disease), cervical 11/28/2016  . Elevated TSH 07/08/2015  . Acid reflux 11/19/2014  . Depression, recurrent ([place])   . Hyperlipidemia   . Hypertension   . Postlaminectomy syndrome, lumbar region 11/17/2014   Past Medical History:  Diagnosis Date  . Chronic kidney disease   . Depression   . Failed back syndrome, lumbar   . Hyperlipidemia   . Hypertension   . Osteoporosis   .  Weight loss    Relevant past medical, surgical, family and social history reviewed and updated as indicated. Interim medical history since our last visit reviewed.  Review of Systems  Per HPI unless specifically indicated above     Objective:    There were no vitals taken for this visit.  Wt Readings from Last 3 Encounters:  09/16/19 98 lb (44.5 kg)  08/14/19 101 lb (45.8 kg)  06/10/19  106 lb (48.1 kg)    Physical Exam  Results for orders placed or performed in visit on 97/41/63  Basic metabolic panel  Result Value Ref Range   Glucose 90 65 - 99 mg/dL   BUN 13 8 - 27 mg/dL   Creatinine, Ser 1.03 (H) 0.57 - 1.00 mg/dL   GFR calc non Af Amer 58 (L) >59 mL/min/1.73   GFR calc Af Amer 66 >59 mL/min/1.73   BUN/Creatinine Ratio 13 12 - 28   Sodium 141 134 - 144 mmol/L   Potassium 4.0 3.5 - 5.2 mmol/L   Chloride 105 96 - 106 mmol/L   CO2 23 20 - 29 mmol/L   Calcium 9.1 8.7 - 10.3 mg/dL  CBC with Differential/Platelet  Result Value Ref Range   WBC 7.4 3.4 - 10.8 x10E3/uL   RBC 4.20 3.77 - 5.28 x10E6/uL   Hemoglobin 13.3 11.1 - 15.9 g/dL   Hematocrit 39.8 34.0 - 46.6 %   MCV 95 79 - 97 fL   MCH 31.7 26.6 - 33.0 pg   MCHC 33.4 31 - 35 g/dL   RDW 11.5 (L) 11.7 - 15.4 %   Platelets 239 150 - 450 x10E3/uL   Neutrophils 61 Not Estab. %   Lymphs 26 Not Estab. %   Monocytes 10 Not Estab. %   Eos 2 Not Estab. %   Basos 1 Not Estab. %   Neutrophils Absolute 4.5 1 - 7 x10E3/uL   Lymphocytes Absolute 1.9 0 - 3 x10E3/uL   Monocytes Absolute 0.7 0 - 0 x10E3/uL   EOS (ABSOLUTE) 0.1 0.0 - 0.4 x10E3/uL   Basophils Absolute 0.1 0 - 0 x10E3/uL   Immature Granulocytes 0 Not Estab. %   Immature Grans (Abs) 0.0 0.0 - 0.1 x10E3/uL      Assessment & Plan:   Problem List Items Addressed This Visit    None       Follow up plan: No follow-ups on file.

## 2020-03-05 ENCOUNTER — Ambulatory Visit: Payer: Medicare HMO | Admitting: Nurse Practitioner

## 2020-03-19 ENCOUNTER — Ambulatory Visit: Payer: Self-pay | Admitting: Family Medicine

## 2020-03-23 ENCOUNTER — Other Ambulatory Visit: Payer: Self-pay

## 2020-03-23 ENCOUNTER — Encounter: Payer: Self-pay | Admitting: Nurse Practitioner

## 2020-03-23 ENCOUNTER — Ambulatory Visit (INDEPENDENT_AMBULATORY_CARE_PROVIDER_SITE_OTHER): Payer: Medicare HMO | Admitting: Nurse Practitioner

## 2020-03-23 ENCOUNTER — Other Ambulatory Visit: Payer: Self-pay | Admitting: Family Medicine

## 2020-03-23 VITALS — BP 113/74 | HR 91 | Temp 98.1°F | Wt 104.4 lb

## 2020-03-23 DIAGNOSIS — E782 Mixed hyperlipidemia: Secondary | ICD-10-CM

## 2020-03-23 DIAGNOSIS — E559 Vitamin D deficiency, unspecified: Secondary | ICD-10-CM

## 2020-03-23 DIAGNOSIS — F339 Major depressive disorder, recurrent, unspecified: Secondary | ICD-10-CM | POA: Diagnosis not present

## 2020-03-23 DIAGNOSIS — Z23 Encounter for immunization: Secondary | ICD-10-CM | POA: Diagnosis not present

## 2020-03-23 DIAGNOSIS — R5383 Other fatigue: Secondary | ICD-10-CM

## 2020-03-23 DIAGNOSIS — J449 Chronic obstructive pulmonary disease, unspecified: Secondary | ICD-10-CM | POA: Diagnosis not present

## 2020-03-23 DIAGNOSIS — L989 Disorder of the skin and subcutaneous tissue, unspecified: Secondary | ICD-10-CM | POA: Insufficient documentation

## 2020-03-23 DIAGNOSIS — F419 Anxiety disorder, unspecified: Secondary | ICD-10-CM

## 2020-03-23 DIAGNOSIS — I10 Essential (primary) hypertension: Secondary | ICD-10-CM | POA: Diagnosis not present

## 2020-03-23 MED ORDER — HYDROCORTISONE 2.5 % EX OINT: TOPICAL_OINTMENT | Freq: Two times a day (BID) | CUTANEOUS | 0 refills | Status: DC

## 2020-03-23 MED ORDER — CLONAZEPAM 0.5 MG PO TABS: 0.5000 mg | ORAL_TABLET | Freq: Two times a day (BID) | ORAL | 0 refills | Status: DC | PRN

## 2020-03-23 MED ORDER — SERTRALINE HCL 100 MG PO TABS: 150.0000 mg | ORAL_TABLET | Freq: Every day | ORAL | 1 refills | Status: DC

## 2020-03-23 NOTE — Assessment & Plan Note (Signed)
Chronic, stable with current inhaler regimen.  Given another breztri sample today.  Referral again placed for [facility] pharmacy to help with medication affordability and management.  Discussed that this will be the last sample given.  Will need spirometry in future for baseline measurement.  Follow-up in 3 months or sooner if anything changes.

## 2020-03-23 NOTE — Telephone Encounter (Signed)
Requested Prescriptions  Pending Prescriptions Disp Refills  . benazepril (LOTENSIN) 40 MG tablet [Pharmacy Med Name: benazepril 40 mg tablet] 90 tablet 0    Sig: TAKE ONE TABLET BY MOUTH ONCE DAILY     Cardiovascular:  ACE Inhibitors Failed - 03/23/2020  3:43 PM      Failed - Cr in normal range and within 180 days    Creatinine, Ser  Date Value Ref Range Status  08/14/2019 1.03 (H) 0.57 - 1.00 mg/dL Final         Failed - K in normal range and within 180 days    Potassium  Date Value Ref Range Status  08/14/2019 4.0 3.5 - 5.2 mmol/L Final         Passed - Patient is not pregnant      Passed - Last BP in normal range    BP Readings from Last 1 Encounters:  03/23/20 113/74         Passed - Valid encounter within last 6 months    Recent Outpatient Visits          Today Depression, recurrent ([place])   [facility] Eulogio Bear, NP   5 months ago Non-intractable vomiting with nausea, unspecified vomiting type   [facility] Volney American, [place]   5 months ago SOB (shortness of breath)   [facility] Volney American, [place]   6 months ago [place], [place], [place]   7 months ago Unintended weight loss   [facility], Lilia Argue, [place]      Future Appointments            In 4 weeks Eulogio Bear, NP [facility] [facility], [place]   In 2 months  [facility] [facility], [place]

## 2020-03-23 NOTE — Assessment & Plan Note (Signed)
Acute, ongoing.  Unclear etiology although given time of year, may be related to atopic dermatitis.  Will start on twice daily hydrocortisone 2.5% ointment and monitor for benefit.  Follow-up in 4 weeks or sooner if needs arise.

## 2020-03-23 NOTE — Patient Instructions (Addendum)
Influenza (Flu) Vaccine (Inactivated or Recombinant): What You Need to Know 1. Why get vaccinated? Influenza vaccine can prevent influenza (flu). Flu is a contagious disease that spreads around the [place] every year, usually between October and May. Anyone can get the flu, but it is more dangerous for some people. Infants and young children, people 65 years of age and older, pregnant women, and people with certain health conditions or a weakened immune system are at greatest risk of flu complications. Pneumonia, bronchitis, sinus infections and ear infections are examples of flu-related complications. If you have a medical condition, such as heart disease, cancer or diabetes, flu can make it worse. Flu can cause fever and chills, sore throat, muscle aches, fatigue, cough, headache, and runny or stuffy nose. Some people may have vomiting and diarrhea, though this is more common in children than adults. Each year thousands of people in the [place] die from flu, and many more are hospitalized. Flu vaccine prevents millions of illnesses and flu-related visits to the doctor each year. 2. Influenza vaccine CDC recommends everyone 57 months of age and older get vaccinated every flu season. Children 6 months through 2 years of age may need 2 doses during a single flu season. Everyone else needs only 1 dose each flu season. It takes about 2 weeks for protection to develop after vaccination. There are many flu viruses, and they are always changing. Each year a new flu vaccine is made to protect against three or four viruses that are likely to cause disease in the upcoming flu season. Even when the vaccine doesn't exactly match these viruses, it may still provide some protection. Influenza vaccine does not cause flu. Influenza vaccine may be given at the same time as other vaccines. 3. Talk with your health care provider Tell your vaccine provider if the person getting the vaccine:  Has had an  allergic reaction after a previous dose of influenza vaccine, or has any severe, life-threatening allergies.  Has ever had Guillain-Barr Syndrome (also called GBS). In some cases, your health care provider may decide to postpone influenza vaccination to a future visit. People with minor illnesses, such as a cold, may be vaccinated. People who are moderately or severely ill should usually wait until they recover before getting influenza vaccine. Your health care provider can give you more information. 4. Risks of a vaccine reaction  Soreness, redness, and swelling where shot is given, fever, muscle aches, and headache can happen after influenza vaccine.  There may be a very small increased risk of Guillain-Barr Syndrome (GBS) after inactivated influenza vaccine (the flu shot). Young children who get the flu shot along with pneumococcal vaccine (PCV13), and/or DTaP vaccine at the same time might be slightly more likely to have a seizure caused by fever. Tell your health care provider if a child who is getting flu vaccine has ever had a seizure. People sometimes faint after medical procedures, including vaccination. Tell your provider if you feel dizzy or have vision changes or ringing in the ears. As with any medicine, there is a very remote chance of a vaccine causing a severe allergic reaction, other serious injury, or death. 5. What if there is a serious problem? An allergic reaction could occur after the vaccinated person leaves the clinic. If you see signs of a severe allergic reaction (hives, swelling of the face and throat, difficulty breathing, a fast heartbeat, dizziness, or weakness), call 9-1-1 and get the person to the nearest hospital. For other signs that  concern you, call your health care provider. Adverse reactions should be reported to the Vaccine Adverse Event Reporting System (VAERS). Your health care provider will usually file this report, or you can do it yourself. Visit the  VAERS website at www.vaers.SamedayNews.es or call 548-096-8347.VAERS is only for reporting reactions, and VAERS staff do not give medical advice. 6. The National Vaccine Injury Compensation Program The [employer] Vaccine Injury Compensation Program (VICP) is a federal program that was created to compensate people who may have been injured by certain vaccines. Visit the VICP website at GoldCloset.com.ee or call 972-355-5924 to learn about the program and about filing a claim. There is a time limit to file a claim for compensation. 7. How can I learn more?  Ask your healthcare provider.  Call your local or state health department.  Contact the Centers for Disease Control and Prevention (CDC): ? Call 774-423-4022 (1-800-CDC-INFO) or ? Visit CDC's https://gibson.com/ Vaccine Information Statement (Interim) Inactivated Influenza Vaccine (01/11/2018) This information is not intended to replace advice given to you by your health care provider. Make sure you discuss any questions you have with your health care provider. Document Revised: 09/04/2018 Document Reviewed: 01/15/2018 Elsevier Patient Education  [place]. Pneumococcal Conjugate Vaccine (PCV13): What You Need to Know 1. Why get vaccinated? Pneumococcal conjugate vaccine (PCV13) can prevent pneumococcal disease. Pneumococcal disease refers to any illness caused by pneumococcal bacteria. These bacteria can cause many types of illnesses, including pneumonia, which is an infection of the lungs. Pneumococcal bacteria are one of the most common causes of pneumonia. Besides pneumonia, pneumococcal bacteria can also cause:  Ear infections  Sinus infections  Meningitis (infection of the tissue covering the brain and spinal cord)  Bacteremia (bloodstream infection) Anyone can get pneumococcal disease, but children under 70 years of age, people with certain medical conditions, adults 37 years or older, and cigarette smokers are at  the highest risk. Most pneumococcal infections are mild. However, some can result in long-term problems, such as brain damage or hearing loss. Meningitis, bacteremia, and pneumonia caused by pneumococcal disease can be fatal. 2. PCV13 PCV13 protects against 13 types of bacteria that cause pneumococcal disease. Infants and young children usually need 4 doses of pneumococcal conjugate vaccine, at 2, 4, 6, and 70-72 months of age. In some cases, a child might need fewer than 4 doses to complete PCV13 vaccination. A dose of PCV23 vaccine is also recommended for anyone 2 years or older with certain medical conditions if they did not already receive PCV13. This vaccine may be given to adults 1 years or older based on discussions between the patient and health care provider. 3. Talk with your health care provider Tell your vaccine provider if the person getting the vaccine:  Has had an allergic reaction after a previous dose of PCV13, to an earlier pneumococcal conjugate vaccine known as PCV7, or to any vaccine containing diphtheria toxoid (for example, DTaP), or has any severe, life-threatening allergies.  In some cases, your health care provider may decide to postpone PCV13 vaccination to a future visit. People with minor illnesses, such as a cold, may be vaccinated. People who are moderately or severely ill should usually wait until they recover before getting PCV13. Your health care provider can give you more information. 4. Risks of a vaccine reaction  Redness, swelling, pain, or tenderness where the shot is given, and fever, loss of appetite, fussiness (irritability), feeling tired, headache, and chills can happen after PCV13. Young children may be at increased risk  for seizures caused by fever after PCV13 if it is administered at the same time as inactivated influenza vaccine. Ask your health care provider for more information. People sometimes faint after medical procedures, including  vaccination. Tell your provider if you feel dizzy or have vision changes or ringing in the ears. As with any medicine, there is a very remote chance of a vaccine causing a severe allergic reaction, other serious injury, or death. 5. What if there is a serious problem? An allergic reaction could occur after the vaccinated person leaves the clinic. If you see signs of a severe allergic reaction (hives, swelling of the face and throat, difficulty breathing, a fast heartbeat, dizziness, or weakness), call 9-1-1 and get the person to the nearest hospital. For other signs that concern you, call your health care provider. Adverse reactions should be reported to the Vaccine Adverse Event Reporting System (VAERS). Your health care provider will usually file this report, or you can do it yourself. Visit the VAERS website at www.vaers.SamedayNews.es or call 8453697126. VAERS is only for reporting reactions, and VAERS staff do not give medical advice. 6. The National Vaccine Injury Compensation Program The [employer] Vaccine Injury Compensation Program (VICP) is a federal program that was created to compensate people who may have been injured by certain vaccines. Visit the VICP website at GoldCloset.com.ee or call (941)802-6956 to learn about the program and about filing a claim. There is a time limit to file a claim for compensation. 7. How can I learn more?  Ask your health care provider.  Call your local or state health department.  Contact the Centers for Disease Control and Prevention (CDC): ? Call 9025438179 (1-800-CDC-INFO) or ? Visit CDC's website at http://hunter.com/ Vaccine Information Statement PCV13 Vaccine (03/28/2018) This information is not intended to replace advice given to you by your health care provider. Make sure you discuss any questions you have with your health care provider. Document Revised: 09/04/2018 Document Reviewed: 12/26/2017 Elsevier Patient Education  [place].   Sertraline tablets What is this medicine? SERTRALINE (SER tra leen) is used to treat depression. It may also be used to treat obsessive compulsive disorder, panic disorder, post-trauma stress, premenstrual dysphoric disorder (PMDD) or social anxiety. This medicine may be used for other purposes; ask your health care provider or pharmacist if you have questions. COMMON BRAND NAME(S): Zoloft What should I tell my health care provider before I take this medicine? They need to know if you have any of these conditions:  bleeding disorders  bipolar disorder or a family history of bipolar disorder  glaucoma  heart disease  high blood pressure  history of irregular heartbeat  history of low levels of calcium, magnesium, or potassium in the blood  if you often drink alcohol  liver disease  receiving electroconvulsive therapy  seizures  suicidal thoughts, plans, or attempt; a previous suicide attempt by you or a family member  take medicines that treat or prevent blood clots  thyroid disease  an unusual or allergic reaction to sertraline, other medicines, foods, dyes, or preservatives  pregnant or trying to get pregnant  breast-feeding How should I use this medicine? Take this medicine by mouth with a glass of water. Follow the directions on the prescription label. You can take it with or without food. Take your medicine at regular intervals. Do not take your medicine more often than directed. Do not stop taking this medicine suddenly except upon the advice of your doctor. Stopping this medicine too quickly may  cause serious side effects or your condition may worsen. A special MedGuide will be given to you by the pharmacist with each prescription and refill. Be sure to read this information carefully each time. Talk to your pediatrician regarding the use of this medicine in children. While this drug may be prescribed for children as young as 7 years for selected  conditions, precautions do apply. Overdosage: If you think you have taken too much of this medicine contact a poison control center or emergency room at once. NOTE: This medicine is only for you. Do not share this medicine with others. What if I miss a dose? If you miss a dose, take it as soon as you can. If it is almost time for your next dose, take only that dose. Do not take double or extra doses. What may interact with this medicine? Do not take this medicine with any of the following medications:  cisapride  dronedarone  linezolid  MAOIs like Carbex, Eldepryl, Marplan, Nardil, and Parnate  methylene blue (injected into a vein)  pimozide  thioridazine This medicine may also interact with the following medications:  alcohol  amphetamines  aspirin and aspirin-like medicines  certain medicines for depression, anxiety, or psychotic disturbances  certain medicines for fungal infections like ketoconazole, fluconazole, posaconazole, and itraconazole  certain medicines for irregular heart beat like flecainide, quinidine, propafenone  certain medicines for migraine headaches like almotriptan, eletriptan, frovatriptan, naratriptan, rizatriptan, sumatriptan, zolmitriptan  certain medicines for sleep  certain medicines for seizures like carbamazepine, valproic acid, phenytoin  certain medicines that treat or prevent blood clots like warfarin, enoxaparin, dalteparin  cimetidine  digoxin  diuretics  fentanyl  isoniazid  lithium  NSAIDs, medicines for pain and inflammation, like ibuprofen or naproxen  other medicines that prolong the QT interval (cause an abnormal heart rhythm) like dofetilide  rasagiline  safinamide  supplements like St. John's wort, kava kava, valerian  tolbutamide  tramadol  tryptophan This list may not describe all possible interactions. Give your health care provider a list of all the medicines, herbs, non-prescription drugs, or dietary  supplements you use. Also tell them if you smoke, drink alcohol, or use illegal drugs. Some items may interact with your medicine. What should I watch for while using this medicine? Tell your doctor if your symptoms do not get better or if they get worse. Visit your doctor or health care professional for regular checks on your progress. Because it may take several weeks to see the full effects of this medicine, it is important to continue your treatment as prescribed by your doctor. Patients and their families should watch out for new or worsening thoughts of suicide or depression. Also watch out for sudden changes in feelings such as feeling anxious, agitated, panicky, irritable, hostile, aggressive, impulsive, severely restless, overly excited and hyperactive, or not being able to sleep. If this happens, especially at the beginning of treatment or after a change in dose, call your health care professional. Dennis Bast may get drowsy or dizzy. Do not drive, use machinery, or do anything that needs mental alertness until you know how this medicine affects you. Do not stand or sit up quickly, especially if you are an older patient. This reduces the risk of dizzy or fainting spells. Alcohol may interfere with the effect of this medicine. Avoid alcoholic drinks. Your mouth may get dry. Chewing sugarless gum or sucking hard candy, and drinking plenty of water may help. Contact your doctor if the problem does not go away or is  severe. What side effects may I notice from receiving this medicine? Side effects that you should report to your doctor or health care professional as soon as possible:  allergic reactions like skin rash, itching or hives, swelling of the face, lips, or tongue  anxious  black, tarry stools  changes in vision  confusion  elevated mood, decreased need for sleep, racing thoughts, impulsive behavior  eye pain  fast, irregular heartbeat  feeling faint or lightheaded, falls  feeling  agitated, angry, or irritable  hallucination, loss of contact with reality  loss of balance or coordination  loss of memory  painful or prolonged erections  restlessness, pacing, inability to keep still  seizures  stiff muscles  suicidal thoughts or other mood changes  trouble sleeping  unusual bleeding or bruising  unusually weak or tired  vomiting Side effects that usually do not require medical attention (report to your doctor or health care professional if they continue or are bothersome):  change in appetite or weight  change in sex drive or performance  diarrhea  increased sweating  indigestion, nausea  tremors This list may not describe all possible side effects. Call your doctor for medical advice about side effects. You may report side effects to FDA at 1-800-FDA-1088. Where should I keep my medicine? Keep out of the reach of children. Store at room temperature between 15 and 30 degrees C (59 and 86 degrees F). Throw away any unused medicine after the expiration date. NOTE: This sheet is a summary. It may not cover all possible information. If you have questions about this medicine, talk to your doctor, pharmacist, or health care provider.  2020 Elsevier/Gold Standard (2018-05-08 10:09:27)

## 2020-03-23 NOTE — Assessment & Plan Note (Signed)
Chronic, exacerbated currently.  PHQ-9 elevated today.  Has been taking more clonazepam to help cope, we discussed this at length and would like for patient to only take as needed, about maybe once per week.  Will refill clonazepam today, PDMP reviewed and appropriate.  We will also increase sertraline from 100 mg to 150 mg and follow-up in 4 weeks.  Hopefully increasing this medication will prevent patient from needing benzodiazepine as frequently.  Also discussed benefits of counseling-patient is agreeable at this time.  We will place referral to [facility] social work today to help establish with community resources for therapy.  Follow-up in 4 weeks.

## 2020-03-23 NOTE — Assessment & Plan Note (Signed)
Will check vitamin D level today and supplement as necessary.  Discussed fall prevention.

## 2020-03-23 NOTE — Assessment & Plan Note (Signed)
Chronic, previously stable on atorvastatin 40 mg.  Overdue for cholesterol check-we will check lipids today.  May need to adjust medication based on these numbers.  Continue atorvastatin for now.  Follow-up in 6 months or sooner if needs arise.

## 2020-03-23 NOTE — Progress Notes (Signed)
BP 113/74   Pulse 91   Temp 98.1 F (36.7 C) (Oral)   Wt 104 lb 6.4 oz (47.4 kg)   SpO2 99%   BMI 16.35 kg/m    Subjective:    Patient ID: Caitlyn May, female    DOB: Apr 05, 1956, 64 y.o.   MRN: [id number]  HPI: Caitlyn May is a 64 y.o. female presenting for medication refill.  Chief Complaint  Patient presents with  . COPD  . Weight Loss   COPD Currently using Breztri, however out of medication and asking for sample. COPD status: controlled Satisfied with current treatment?: yes Oxygen use: no Dyspnea frequency:  Less than once per week Cough frequency: less than once per week Rescue inhaler frequency:  Less than once per week Limitation of activity: no Productive cough: yes in the morning Last Spirometry: never done Pneumovax: Up to Date Influenza: Up to Date  DEPRESSION Patient reports she has been struggling with her mood for years.  COVID has really been tough for her.  Has been taking clonazepam twice daily as well as sertraline 100 mg daily.  Mood status: uncontrolled Satisfied with current treatment?: no Symptom severity: severe  Duration of current treatment : chronic Side effects: no Medication compliance: excellent compliance Psychotherapy/counseling: no not in past, desires counseling today Previous psychiatric medications: sertraline, clonazepam, was previously on alprazolam Depressed mood: yes Anxious mood: yes Anhedonia: yes Significant weight loss or gain: no Insomnia: yes hard to fall asleep and stay asleep Fatigue: yes Feelings of worthlessness or guilt: no Impaired concentration/indecisiveness: no Suicidal ideations: no Hopelessness: no Crying spells: yes Depression screen [facility] 2/9 03/23/2020 09/16/2019 06/10/2019 06/04/2019 09/10/2018  Decreased Interest 3 2 0 3 1  Down, Depressed, Hopeless 3 2 3 2 1   PHQ - 2 Score 6 4 3 5 2   Altered sleeping 2 2 1 1  0  Tired, decreased energy 3 3 1 3 3   Change in appetite 0 3 1 1  0  Feeling bad  or failure about yourself  1 1 0 1 0  Trouble concentrating 0 2 0 1 0  Moving slowly or fidgety/restless 0 1 0 2 0  Suicidal thoughts 0 0 0 0 0  PHQ-9 Score 12 16 6 14 5   Difficult doing work/chores Not difficult at all Not difficult at all Not difficult at all Somewhat difficult Not difficult at all  Some recent data might be hidden   SKIN LESION Reports the back of her neck has been itchy and does not know why.  Thinks she has some small spots and is fearful that it is shingles. Duration: months Location: back of neck; mid  Painful: no Itching: yes Onset: gradual Context: not changing Associated signs and symptoms: no fevers, nausea and vomiting History of skin cancer: no History of precancerous skin lesions: no Family history of skin cancer: no  Allergies  Allergen Reactions  . Accupril [Quinapril Hcl]   . Avelox [Moxifloxacin]   . Ciprofloxacin   . Hctz [Hydrochlorothiazide] Other (See Comments)    Leg swelling  . Penicillins   . Sulfa Antibiotics    Outpatient Encounter Medications as of 03/23/2020  Medication Sig  . atorvastatin (LIPITOR) 40 MG tablet TAKE ONE TABLET BY MOUTH ONCE DAILY  . benazepril (LOTENSIN) 40 MG tablet TAKE ONE TABLET BY MOUTH ONCE DAILY  . Budeson-Glycopyrrol-Formoterol (BREZTRI AEROSPHERE) 160-9-4.8 MCG/ACT AERO Inhale 1 puff into the lungs every 6 (six) hours as needed.  . cetirizine (ZYRTEC) 10 MG tablet Take 1 tablet (10 mg  total) by mouth daily.  . clonazePAM (KLONOPIN) 0.5 MG tablet Take 1 tablet (0.5 mg total) by mouth 2 (two) times daily as needed for anxiety.  . cyclobenzaprine (FLEXERIL) 5 MG tablet TAKE ONE TABLET BY MOUTH ONCE DAILY AS NEEDED FOR MUSCLE SPASMS  . ipratropium-albuterol (DUONEB) 0.5-2.5 (3) MG/3ML SOLN Take 3 mLs by nebulization every 4 (four) hours as needed.  . megestrol (MEGACE) 40 MG tablet TAKE ONE TABLET BY MOUTH FOUR TIMES DAILY AS NEEDED  . meloxicam (MOBIC) 7.5 MG tablet TAKE ONE TABLET BY MOUTH ONCE DAILY  .  metoprolol succinate (TOPROL-XL) 50 MG 24 hr tablet TAKE ONE TABLET BY MOUTH ONCE DAILY WITH A MEAL  . montelukast (SINGULAIR) 10 MG tablet Take 1 tablet (10 mg total) by mouth at bedtime.  . ondansetron (ZOFRAN ODT) 8 MG disintegrating tablet Take 1 tablet (8 mg total) by mouth every 8 (eight) hours as needed for nausea or vomiting.  . pantoprazole (PROTONIX) 40 MG tablet Take 1 tablet by mouth at bedtime.  . sertraline (ZOLOFT) 100 MG tablet Take 1.5 tablets (150 mg total) by mouth daily.  Marland Kitchen triamcinolone (NASACORT AQ) 55 MCG/ACT AERO nasal inhaler Place 2 sprays into the nose daily.  . VENTOLIN HFA 108 (90 Base) MCG/ACT inhaler INHALE 2 PUFFS BY MOUTH INTO THE LUNGS EVERY 6 HOURS AS NEEDED FOR WHEEZING OR SHORTNESS OF BREATH  . [DISCONTINUED] clonazePAM (KLONOPIN) 0.5 MG tablet Take 1 tablet (0.5 mg total) by mouth 2 (two) times daily as needed. for anxiety  . [DISCONTINUED] sertraline (ZOLOFT) 100 MG tablet Take 1 tablet (100 mg total) by mouth daily.  . [DISCONTINUED] umeclidinium-vilanterol (ANORO ELLIPTA) 62.5-25 MCG/INH AEPB Inhale 1 puff into the lungs daily.  . hydrocortisone 2.5 % ointment Apply topically 2 (two) times daily.  . [DISCONTINUED] amLODipine (NORVASC) 5 MG tablet Take 1 tablet (5 mg total) by mouth daily. (Patient not taking: Reported on 03/23/2020)  . [DISCONTINUED] ranitidine (ZANTAC) 75 MG tablet Take 1 tablet (75 mg total) by mouth 2 (two) times daily.   No facility-administered encounter medications on file as of 03/23/2020.   Patient Active Problem List   Diagnosis Date Noted  . Skin lesion of neck 03/23/2020  . COPD (chronic obstructive pulmonary disease) ([place]) 08/20/2019  . Acute rhinosinusitis 07/23/2019  . Anxiety 04/19/2018  . Syncope 12/11/2017  . Allergic rhinitis 09/28/2017  . Asthma due to environmental allergies 08/22/2017  . Cervical spine tumor 08/22/2017  . Weight loss 07/18/2017  . Advanced care planning/counseling discussion 04/14/2017  .  Vitamin D deficiency 02/07/2017  . Disturbance of skin sensation 11/29/2016  . Chronic shoulder pain (Location of Tertiary source of pain) (Bilateral) (R>L) 11/29/2016  . Chronic upper extremity pain (Bilateral) (R>L) 11/29/2016  . Chronic cervical radicular pain (Bilateral) (R>L) 11/29/2016  . Chronic hip pain (Bilateral) (R>L) 11/29/2016  . Osteoarthritis of hip (Bilateral) 11/29/2016  . Chronic sacroiliac joint pain (Bilateral) 11/29/2016  . Lumbar facet syndrome (Right) 11/29/2016  . Long term prescription benzodiazepine use 11/28/2016  . Chronic pain syndrome 11/28/2016  . Long term (current) use of opiate analgesic 11/28/2016  . Long term prescription opiate use 11/28/2016  . Opiate use 11/28/2016  . Failed back surgical syndrome 11/28/2016  . Chronic low back pain (Location of Primary Source of Pain) (Bilateral) (R>L) 11/28/2016  . Lumbar radiculitis (Right) 11/28/2016  . Chronic lower extremity pain (Bilateral) (R>L) 11/28/2016  . Chronic lumbar radicular pain (Bilateral) (R>L) 11/28/2016  . Chronic neck pain (Location of Secondary source of pain) (  Bilateral) (R>L) 11/28/2016  . DDD (degenerative disc disease), lumbar 11/28/2016  . DDD (degenerative disc disease), cervical 11/28/2016  . Elevated TSH 07/08/2015  . Acid reflux 11/19/2014  . Depression, recurrent ([place])   . Hyperlipidemia   . Hypertension   . Postlaminectomy syndrome, lumbar region 11/17/2014   Past Medical History:  Diagnosis Date  . Chronic kidney disease   . Depression   . Failed back syndrome, lumbar   . Hyperlipidemia   . Hypertension   . Osteoporosis   . Weight loss    Relevant past medical, surgical, family and social history reviewed and updated as indicated. Interim medical history since our last visit reviewed.  Review of Systems  Constitutional: Negative.  Negative for activity change and fever.  Respiratory: Negative.  Negative for chest tightness, shortness of breath and wheezing.    Cardiovascular: Negative.   Gastrointestinal: Negative.   Musculoskeletal: Negative.   Skin: Positive for rash. Negative for color change and wound.  Neurological: Negative.  Negative for dizziness, light-headedness and headaches.  Psychiatric/Behavioral: Positive for sleep disturbance. Negative for agitation, behavioral problems, confusion, decreased concentration, dysphoric mood, self-injury and suicidal ideas. The patient is nervous/anxious.    Per HPI unless specifically indicated above     Objective:    BP 113/74   Pulse 91   Temp 98.1 F (36.7 C) (Oral)   Wt 104 lb 6.4 oz (47.4 kg)   SpO2 99%   BMI 16.35 kg/m   Wt Readings from Last 3 Encounters:  03/23/20 104 lb 6.4 oz (47.4 kg)  09/16/19 98 lb (44.5 kg)  08/14/19 101 lb (45.8 kg)    Physical Exam Vitals and nursing note reviewed.  Constitutional:      General: She is not in acute distress.    Appearance: Normal appearance. She is not toxic-appearing.  HENT:     Head: Normocephalic and atraumatic.  Eyes:     General: No scleral icterus.    Extraocular Movements: Extraocular movements intact.  Cardiovascular:     Rate and Rhythm: Normal rate and regular rhythm.     Heart sounds: Normal heart sounds. No murmur heard.   Pulmonary:     Effort: Pulmonary effort is normal. No respiratory distress.     Breath sounds: Normal breath sounds. No wheezing, rhonchi or rales.  Skin:    General: Skin is warm and dry.     Capillary Refill: Capillary refill takes less than 2 seconds.     Coloration: Skin is not jaundiced or pale.     Findings: No erythema.       Neurological:     General: No focal deficit present.     Mental Status: She is alert and oriented to person, place, and time.     Motor: No weakness.     Gait: Gait normal.  Psychiatric:        Mood and Affect: Mood normal.        Behavior: Behavior normal.        Thought Content: Thought content normal.        Judgment: Judgment normal.         Assessment & Plan:   Problem List Items Addressed This Visit      Cardiovascular and Mediastinum   Hypertension    Chronic, stable.  Blood pressure at goal today in office.  Will continue benazepril 40 mg, metoprolol 50 mg for now.  Overdue for labs -we will check CMP, CBC today.      Relevant Orders  Comprehensive metabolic panel     Respiratory   COPD (chronic obstructive pulmonary disease) (HCC)    Chronic, stable with current inhaler regimen.  Given another breztri sample today.  Referral again placed for CCM pharmacy to help with medication affordability and management.  Discussed that this will be the last sample given.  Will need spirometry in future for baseline measurement.  Follow-up in 3 months or sooner if anything changes.      Relevant Medications   Budeson-Glycopyrrol-Formoterol (BREZTRI AEROSPHERE) 160-9-4.8 MCG/ACT AERO   Other Relevant Orders   Comprehensive metabolic panel   Referral to Chronic Care Management Services     Musculoskeletal and Integument   Skin lesion of neck    Acute, ongoing.  Unclear etiology although given time of year, may be related to atopic dermatitis.  Will start on twice daily hydrocortisone 2.5% ointment and monitor for benefit.  Follow-up in 4 weeks or sooner if needs arise.        Other   Depression, recurrent ([place]) - Primary    Chronic, exacerbated currently.  PHQ-9 elevated today.  Has been taking more clonazepam to help cope, we discussed this at length and would like for patient to only take as needed, about maybe once per week.  Will refill clonazepam today, PDMP reviewed and appropriate.  We will also increase sertraline from 100 mg to 150 mg and follow-up in 4 weeks.  Hopefully increasing this medication will prevent patient from needing benzodiazepine as frequently.  Also discussed benefits of counseling-patient is agreeable at this time.  We will place referral to CCM social work today to help establish with community  resources for therapy.  Follow-up in 4 weeks.      Relevant Medications   sertraline (ZOLOFT) 100 MG tablet   Other Relevant Orders   Referral to Chronic Care Management Services   TSH   CBC with Differential/Platelet   Hyperlipidemia    Chronic, previously stable on atorvastatin 40 mg.  Overdue for cholesterol check-we will check lipids today.  May need to adjust medication based on these numbers.  Continue atorvastatin for now.  Follow-up in 6 months or sooner if needs arise.      Relevant Orders   Lipid Panel w/o Chol/HDL Ratio   Vitamin D deficiency    Will check vitamin D level today and supplement as necessary.  Discussed fall prevention.      Relevant Orders   VITAMIN D 25 Hydroxy (Vit-D Deficiency, Fractures)   Anxiety   Relevant Medications   sertraline (ZOLOFT) 100 MG tablet   Other Relevant Orders   Referral to Chronic Care Management Services   TSH   CBC with Differential/Platelet    Other Visit Diagnoses    Need for influenza vaccination       Relevant Orders   Flu Vaccine QUAD High Dose(Fluad) (Completed)   Need for pneumococcal vaccination       Relevant Orders   Pneumococcal conjugate vaccine 13-valent IM (Completed)   Fatigue, unspecified type            Follow up plan: Return in about 4 weeks (around 04/20/2020) for mood f/u.

## 2020-03-23 NOTE — Assessment & Plan Note (Signed)
Chronic, stable.  Blood pressure at goal today in office.  Will continue benazepril 40 mg, metoprolol 50 mg for now.  Overdue for labs -we will check CMP, CBC today.

## 2020-03-24 LAB — COMPREHENSIVE METABOLIC PANEL
ALT: 15 IU/L (ref 0–32)
AST: 17 IU/L (ref 0–40)
Albumin/Globulin Ratio: 1.8 (ref 1.2–2.2)
Albumin: 4.7 g/dL (ref 3.8–4.8)
Alkaline Phosphatase: 85 IU/L (ref 44–121)
BUN/Creatinine Ratio: 16 (ref 12–28)
BUN: 19 mg/dL (ref 8–27)
Bilirubin Total: 0.4 mg/dL (ref 0.0–1.2)
CO2: 22 mmol/L (ref 20–29)
Calcium: 9.9 mg/dL (ref 8.7–10.3)
Chloride: 103 mmol/L (ref 96–106)
Creatinine, Ser: 1.17 mg/dL — ABNORMAL HIGH (ref 0.57–1.00)
GFR calc Af Amer: 57 mL/min/{1.73_m2} — ABNORMAL LOW (ref >59)
GFR calc non Af Amer: 49 mL/min/{1.73_m2} — ABNORMAL LOW (ref >59)
Globulin, Total: 2.6 g/dL (ref 1.5–4.5)
Glucose: 92 mg/dL (ref 65–99)
Potassium: 4.5 mmol/L (ref 3.5–5.2)
Sodium: 140 mmol/L (ref 134–144)
Total Protein: 7.3 g/dL (ref 6.0–8.5)

## 2020-03-24 LAB — CBC WITH DIFFERENTIAL/PLATELET
Basophils Absolute: 0.1 10*3/uL (ref 0.0–0.2)
Basos: 1 %
EOS (ABSOLUTE): 0.1 10*3/uL (ref 0.0–0.4)
Eos: 1 %
Hematocrit: 46.1 % (ref 34.0–46.6)
Hemoglobin: 15.7 g/dL (ref 11.1–15.9)
Immature Grans (Abs): 0 10*3/uL (ref 0.0–0.1)
Immature Granulocytes: 0 %
Lymphocytes Absolute: 1.8 10*3/uL (ref 0.7–3.1)
Lymphs: 22 %
MCH: 31.5 pg (ref 26.6–33.0)
MCHC: 34.1 g/dL (ref 31.5–35.7)
MCV: 93 fL (ref 79–97)
Monocytes Absolute: 0.8 10*3/uL (ref 0.1–0.9)
Monocytes: 9 %
Neutrophils Absolute: 5.6 10*3/uL (ref 1.4–7.0)
Neutrophils: 67 %
Platelets: 298 10*3/uL (ref 150–450)
RBC: 4.98 x10E6/uL (ref 3.77–5.28)
RDW: 11.9 % (ref 11.7–15.4)
WBC: 8.3 10*3/uL (ref 3.4–10.8)

## 2020-03-24 LAB — VITAMIN D 25 HYDROXY (VIT D DEFICIENCY, FRACTURES): Vit D, 25-Hydroxy: 29.2 ng/mL — ABNORMAL LOW (ref 30.0–100.0)

## 2020-03-24 LAB — LIPID PANEL W/O CHOL/HDL RATIO
Cholesterol, Total: 184 mg/dL (ref 100–199)
HDL: 56 mg/dL (ref >39)
LDL Chol Calc (NIH): 106 mg/dL — ABNORMAL HIGH (ref 0–99)
Triglycerides: 122 mg/dL (ref 0–149)
VLDL Cholesterol Cal: 22 mg/dL (ref 5–40)

## 2020-03-24 LAB — TSH: TSH: 3.27 u[IU]/mL (ref 0.450–4.500)

## 2020-03-25 ENCOUNTER — Telehealth: Payer: Self-pay | Admitting: *Deleted

## 2020-03-25 NOTE — Chronic Care Management (AMB) (Signed)
  Chronic Care Management   Outreach Note  03/25/2020 Name: Roux Brandy MRN: [id number] DOB: 1955/08/08  Britanie Harshman is a 63 y.o. year old female who is a primary care patient of Volney American, [place]. I reached out to [facility] by phone today in response to a referral sent by Ms. [facility]'s PCP, Volney American, PA-C.     An unsuccessful telephone outreach was attempted today. The patient was referred to the case management team for assistance with care management and care coordination.   Follow Up Plan: The care management team will reach out to the patient again over the next 7 days.  If patient returns call to provider office, please advise to call [place] at [phone].  [place] Management  Direct Dial: [phone]

## 2020-03-26 MED ORDER — ATORVASTATIN CALCIUM 80 MG PO TABS: 80.0000 mg | ORAL_TABLET | Freq: Every day | ORAL | 1 refills | Status: DC

## 2020-03-26 NOTE — Addendum Note (Signed)
Addended by: Noemi Chapel A on: 03/26/2020 10:25 AM   Modules accepted: Orders

## 2020-03-26 NOTE — Progress Notes (Signed)
Sounds great.  Atorvastatin 80 mg sent into pharmacy.  Thank you.

## 2020-03-31 NOTE — Chronic Care Management (AMB) (Signed)
°  Chronic Care Management   Outreach Note  03/31/2020 Name: [name] MRN: [id number] DOB: Oct 18, 1955  Caitlyn May is a 64 y.o. year old female who is a primary care patient of Volney American, [place]. I reached out to [facility] by phone today in response to a referral sent by Ms. [facility]'s PCP, Volney American, PA-C.     A second unsuccessful telephone outreach was attempted today. The patient was referred to the case management team for assistance with care management and care coordination.   Follow Up Plan: The care management team will reach out to the patient again over the next 7 days. If patient returns call to provider office, please advise to call [place] at [phone].  [place] Management

## 2020-04-01 ENCOUNTER — Other Ambulatory Visit: Payer: Self-pay

## 2020-04-01 MED ORDER — CETIRIZINE HCL 10 MG PO TABS: 10.0000 mg | ORAL_TABLET | Freq: Every day | ORAL | 11 refills | Status: DC

## 2020-04-01 NOTE — Telephone Encounter (Signed)
Patient last seen 03/23/20 and has appointment 04/20/20

## 2020-04-06 ENCOUNTER — Ambulatory Visit: Payer: Self-pay

## 2020-04-06 NOTE — Chronic Care Management (AMB) (Signed)
  Care Management   Note  04/06/2020 Name: [name] MRN: [id number] DOB: 1955-06-05  Caitlyn May is a 64 y.o. year old female who is a primary care patient of Volney American, [place]hal [place]ia and is actively engaged with the care management team. I reached out to [name] by phone today to assist with scheduling an initial visit with the Pharmacist Licensed Clinical Social Worker   [name] was given information about Chronic Care Management services today including:  1. CCM service includes personalized support from designated clinical staff supervised by her physician, including individualized plan of care and coordination with other care providers 2. 24/7 contact phone numbers for assistance for urgent and routine care needs. 3. Service will only be billed when office clinical staff spend 20 minutes or more in a month to coordinate care. 4. Only one practitioner may furnish and bill the service in a calendar month. 5. The patient may stop CCM services at any time (effective at the end of the month) by phone call to the office staff. 6. The patient will be responsible for cost sharing (co-pay) of up to 20% of the service fee (after annual deductible is met).  Patient agreed to services and verbal consent obtained.   Follow up plan: Telephone appointment with care management team member scheduled for:04/06/2020 with LCSW and 04/22/2020 with PharmD  [place] Management  Direct Dial: [phone]

## 2020-04-06 NOTE — Chronic Care Management (AMB) (Addendum)
  Care Management   Follow Up Note   04/06/2020 Name: Caitlyn May MRN: [id number] DOB: 12-Nov-1955  Referred by: Volney American, PA-C Reason for referral : [place] is a 64 y.o. year old female who is a primary care patient of Volney American, [place]. The care management team was consulted for assistance with care management and care coordination needs.    Review of patient status, including review of consultants reports, relevant laboratory and other test results, and collaboration with appropriate care team members and the patient's provider was performed as part of comprehensive patient evaluation and provision of chronic care management services.    LCSW completed CCM outreach attempt today but was unable to reach patient successfully. A HIPPA compliant voice message was unable to be left as mailbox was full. This was LCSW's third unsuccessful outreach attempt but patient's new PCP had made another referral fro CCM Services. LCSW will make one final outreach before closing new referral for [place].   Eula Fried, BSW, MSW, [facility] Care Management [email or web address] Phone: [phone]

## 2020-04-06 NOTE — Chronic Care Management (AMB) (Signed)
  Chronic Care Management   Outreach Note  04/06/2020 Name: Leanor Voris MRN: [id number] DOB: 1955/11/30  Fryda Molenda is a 64 y.o. year old female who is a primary care patient of Volney American, [place]. I reached out to [facility] by phone today in response to a referral sent by Ms. [facility]'s PCP, Volney American, PA-C.     Third unsuccessful telephone outreach was attempted today. The patient was referred to the case management team for assistance with care management and care coordination. The patient's primary care provider has been notified of our unsuccessful attempts to make or maintain contact with the patient. The care management team is pleased to engage with this patient at any time in the future should he/she be interested in assistance from the care management team.   Follow Up Plan: We have been unable to make contact with the patient for follow up. The care management team is available to follow up with the patient after provider conversation with the patient regarding recommendation for care management engagement and subsequent re-referral to the care management team. If patient returns call to provider office, please advise to call [place] at [phone].  [place] Management  Direct Dial: [phone]

## 2020-04-17 ENCOUNTER — Telehealth: Payer: Self-pay | Admitting: *Deleted

## 2020-04-17 NOTE — Chronic Care Management (AMB) (Signed)
  Care Management   Note  04/17/2020 Name: Brian Kocourek MRN: [id number] DOB: [birth year]  Jenella Craigie is a 64 y.o. year old female who is a primary care patient of Volney American, Hershal Coria and is actively engaged with the care management team. I reached out to [name] by phone today to assist with re-scheduling a follow up visit with the Licensed Clinical Social Worker  Follow up plan: Unsuccessful telephone outreach attempt made. A HIPAA compliant phone message was left for the patient providing contact information and requesting a return call.  The care management team will reach out to the patient again over the next 7 days.  If patient returns call to provider office, please advise to call [place] at [place] Management  Direct Dial: [phone]

## 2020-04-20 ENCOUNTER — Ambulatory Visit
Admission: RE | Admit: 2020-04-20 | Discharge: 2020-04-20 | Disposition: A | Discharge status: Home / Self Care | Payer: Medicare HMO | Source: Home / Self Care | Attending: Nurse Practitioner | Admitting: Nurse Practitioner

## 2020-04-20 ENCOUNTER — Ambulatory Visit
Admission: RE | Admit: 2020-04-20 | Discharge: 2020-04-20 | Disposition: A | Discharge status: Home / Self Care | Payer: Medicare HMO | Source: Ambulatory Visit | Attending: Nurse Practitioner | Admitting: Nurse Practitioner

## 2020-04-20 ENCOUNTER — Other Ambulatory Visit: Payer: Self-pay

## 2020-04-20 ENCOUNTER — Ambulatory Visit (INDEPENDENT_AMBULATORY_CARE_PROVIDER_SITE_OTHER): Payer: Medicare HMO | Admitting: Nurse Practitioner

## 2020-04-20 ENCOUNTER — Encounter: Payer: Self-pay | Admitting: Nurse Practitioner

## 2020-04-20 VITALS — BP 222/102 | HR 71 | Temp 98.2°F | Resp 18 | Ht 67.0 in | Wt 115.0 lb

## 2020-04-20 DIAGNOSIS — N898 Other specified noninflammatory disorders of vagina: Secondary | ICD-10-CM

## 2020-04-20 DIAGNOSIS — F339 Major depressive disorder, recurrent, unspecified: Secondary | ICD-10-CM

## 2020-04-20 DIAGNOSIS — R059 Cough, unspecified: Secondary | ICD-10-CM | POA: Diagnosis not present

## 2020-04-20 DIAGNOSIS — J439 Emphysema, unspecified: Secondary | ICD-10-CM | POA: Diagnosis not present

## 2020-04-20 DIAGNOSIS — R399 Unspecified symptoms and signs involving the genitourinary system: Secondary | ICD-10-CM | POA: Diagnosis not present

## 2020-04-20 DIAGNOSIS — I10 Essential (primary) hypertension: Secondary | ICD-10-CM

## 2020-04-20 LAB — URINALYSIS, ROUTINE W REFLEX MICROSCOPIC
Bilirubin, UA: NEGATIVE
Glucose, UA: NEGATIVE
Ketones, UA: NEGATIVE
Leukocytes,UA: NEGATIVE
Nitrite, UA: POSITIVE — AB
Protein,UA: NEGATIVE
Specific Gravity, UA: 1.015 (ref 1.005–1.030)
Urobilinogen, Ur: 0.2 mg/dL (ref 0.2–1.0)
pH, UA: 5.5 (ref 5.0–7.5)

## 2020-04-20 LAB — MICROSCOPIC EXAMINATION

## 2020-04-20 LAB — WET PREP FOR TRICH, YEAST, CLUE
Clue Cell Exam: NEGATIVE
Trichomonas Exam: NEGATIVE
Yeast Exam: NEGATIVE

## 2020-04-20 MED ORDER — AMLODIPINE BESYLATE 5 MG PO TABS: 5.0000 mg | ORAL_TABLET | Freq: Every day | ORAL | 0 refills | Status: DC

## 2020-04-20 MED ORDER — DOXYCYCLINE HYCLATE 100 MG PO TABS: 100.0000 mg | ORAL_TABLET | Freq: Two times a day (BID) | ORAL | 0 refills | Status: DC

## 2020-04-20 NOTE — Progress Notes (Signed)
BP (!) 222/102 (BP Location: Left Arm, Cuff Size: Normal)   Pulse 71   Temp 98.2 F (36.8 C) (Oral)   Resp 18   Ht 5\' 7"  (1.702 m)   Wt 115 lb (52.2 kg)   SpO2 98%   BMI 18.01 kg/m    Subjective:    Patient ID: Caitlyn May, female    DOB: 04/28/1956, 64 y.o.   MRN: [id number]  HPI: Caitlyn May is a 64 y.o. female presenting with multiple concerns.  Chief Complaint  Patient presents with  . Depression    sinus pressure, gum pain, nasal drainage, post nasal drainage, chills, fever and sore throat  x 3-4 weeks   . Dysuria    urinary urgency, urinary frequency, urinary incontinence x 2- 43mths. Pt state symptoms are worsening as times persistt    DEPRESSION Reports this time of year is hard for her. Mood status: better Satisfied with current treatment?: yes Symptom severity: mild  Duration of current treatment : chronic Side effects: no Medication compliance: excellent compliance Psychotherapy/counseling: no Previous psychiatric medications: clonazepam 0.5 mg twice daily as needed, sertraline 100 mg daily Depressed mood: yes Anxious mood: yes Anhedonia: no Significant weight loss or gain: no Insomnia: no Fatigue: yes Feelings of worthlessness or guilt: no Impaired concentration/indecisiveness: no Suicidal ideations: no Hopelessness: no Crying spells: yes Depression screen [facility] 2/9 04/20/2020 03/23/2020 09/16/2019 06/10/2019 06/04/2019  Decreased Interest 1 3 2  0 3  Down, Depressed, Hopeless 1 3 2 3 2   PHQ - 2 Score 2 6 4 3 5   Altered sleeping 1 2 2 1 1   Tired, decreased energy 3 3 3 1 3   Change in appetite 1 0 3 1 1   Feeling bad or failure about yourself  1 1 1  0 1  Trouble concentrating 1 0 2 0 1  Moving slowly or fidgety/restless 0 0 1 0 2  Suicidal thoughts 0 0 0 0 0  PHQ-9 Score 9 12 16 6 14   Difficult doing work/chores Somewhat difficult Not difficult at all Not difficult at all Not difficult at all Somewhat difficult  Some recent data might be hidden    GAD 7 : Generalized Anxiety Score 04/20/2020 06/04/2019 09/14/2016  Nervous, Anxious, on Edge 1 2 2   Control/stop worrying 1 3 3   Worry too much - different things 2 3 3   Trouble relaxing 2 3 3   Restless 1 0 3  Easily annoyed or irritable 1 3 3   Afraid - awful might happen 0 2 1  Total GAD 7 Score 8 16 18   Anxiety Difficulty Somewhat difficult Somewhat difficult -    URINARY SYMPTOMS Duration: 3-4 weeks Dysuria: yes Urinary frequency: yes Urgency: yes Small volume voids: yes Symptom severity: severe Urinary incontinence: no Foul odor: no Hematuria: no Abdominal pain: no Back pain: no Suprapubic pain/pressure: yes Flank pain: no Fever:  Yes; low 100s  Nausea: no Vomiting: no Relief with cranberry juice: not tried Relief with pyridium: not tried Status: stable Previous urinary tract infection: yes Recurrent urinary tract infection: no Sexual activity: not currently sexually active History of sexually tr ansmitted disease: no Vaginal discharge: yes Treatments attempted: has tried monistat for yeast infection  UPPER RESPIRATORY TRACT INFECTION Onset: 3-4 weeks ago Worst symptom: drainage down the back of her throat Fever: yes; low grade Cough: yes; coughing stuff up Shortness of breath: yes Wheezing: yes Chest pain: yes Chest tightness: yes Chest congestion: no Nasal congestion: yes Runny nose: yes Post nasal drip: yes Sneezing: yes Sore  throat: no Swollen glands: no Sinus pressure: yes Headache: yes Face pain: yes Toothache: yes Ear pain: no  Ear pressure: yes  Eyes red/itching:yes Eye drainage/crusting: no  Nausea: no Vomiting: no Diarrhea: no Change in appetite: no Loss of taste/smell: no Rash: no Fatigue: yes Sick contacts: no Strep contacts: no  Context: stable Recurrent sinusitis: no Treatments attempted: humidifier,  Relief with OTC medications: not tried  Allergies  Allergen Reactions  . Accupril [Quinapril Hcl]   . Avelox  [Moxifloxacin]   . Ciprofloxacin   . Hctz [Hydrochlorothiazide] Other (See Comments)    Leg swelling  . Penicillins   . Sulfa Antibiotics    Outpatient Encounter Medications as of 04/20/2020  Medication Sig  . atorvastatin (LIPITOR) 80 MG tablet Take 1 tablet (80 mg total) by mouth daily.  . benazepril (LOTENSIN) 40 MG tablet TAKE ONE TABLET BY MOUTH ONCE DAILY  . Budeson-Glycopyrrol-Formoterol (BREZTRI AEROSPHERE) 160-9-4.8 MCG/ACT AERO Inhale 1 puff into the lungs every 6 (six) hours as needed.  . cetirizine (ZYRTEC) 10 MG tablet Take 1 tablet (10 mg total) by mouth daily.  . clonazePAM (KLONOPIN) 0.5 MG tablet Take 1 tablet (0.5 mg total) by mouth 2 (two) times daily as needed for anxiety.  . cyclobenzaprine (FLEXERIL) 5 MG tablet TAKE ONE TABLET BY MOUTH ONCE DAILY AS NEEDED FOR MUSCLE SPASMS  . ipratropium-albuterol (DUONEB) 0.5-2.5 (3) MG/3ML SOLN Take 3 mLs by nebulization every 4 (four) hours as needed.  . megestrol (MEGACE) 40 MG tablet TAKE ONE TABLET BY MOUTH FOUR TIMES DAILY AS NEEDED  . meloxicam (MOBIC) 7.5 MG tablet TAKE ONE TABLET BY MOUTH ONCE DAILY  . metoprolol succinate (TOPROL-XL) 50 MG 24 hr tablet TAKE ONE TABLET BY MOUTH ONCE DAILY WITH A MEAL  . montelukast (SINGULAIR) 10 MG tablet Take 1 tablet (10 mg total) by mouth at bedtime.  . ondansetron (ZOFRAN ODT) 8 MG disintegrating tablet Take 1 tablet (8 mg total) by mouth every 8 (eight) hours as needed for nausea or vomiting.  . pantoprazole (PROTONIX) 40 MG tablet Take 1 tablet by mouth at bedtime.  . sertraline (ZOLOFT) 100 MG tablet Take 1.5 tablets (150 mg total) by mouth daily.  Marland Kitchen triamcinolone (NASACORT AQ) 55 MCG/ACT AERO nasal inhaler Place 2 sprays into the nose daily.  . VENTOLIN HFA 108 (90 Base) MCG/ACT inhaler INHALE 2 PUFFS BY MOUTH INTO THE LUNGS EVERY 6 HOURS AS NEEDED FOR WHEEZING OR SHORTNESS OF BREATH  . amLODipine (NORVASC) 5 MG tablet Take 1 tablet (5 mg total) by mouth daily.  Marland Kitchen doxycycline  (VIBRA-TABS) 100 MG tablet Take 1 tablet (100 mg total) by mouth 2 (two) times daily.  . hydrocortisone 2.5 % ointment Apply topically 2 (two) times daily. (Patient not taking: Reported on 04/20/2020)   No facility-administered encounter medications on file as of 04/20/2020.   Patient Active Problem List   Diagnosis Date Noted  . UTI symptoms 04/20/2020  . Cough 04/20/2020  . Skin lesion of neck 03/23/2020  . COPD (chronic obstructive pulmonary disease) ([place]) 08/20/2019  . Acute rhinosinusitis 07/23/2019  . Anxiety 04/19/2018  . Syncope 12/11/2017  . Allergic rhinitis 09/28/2017  . Asthma due to environmental allergies 08/22/2017  . Cervical spine tumor 08/22/2017  . Weight loss 07/18/2017  . Advanced care planning/counseling discussion 04/14/2017  . Vitamin D deficiency 02/07/2017  . Disturbance of skin sensation 11/29/2016  . Chronic shoulder pain (Location of Tertiary source of pain) (Bilateral) (R>L) 11/29/2016  . Chronic upper extremity pain (Bilateral) (R>L) 11/29/2016  .  Chronic cervical radicular pain (Bilateral) (R>L) 11/29/2016  . Chronic hip pain (Bilateral) (R>L) 11/29/2016  . Osteoarthritis of hip (Bilateral) 11/29/2016  . Chronic sacroiliac joint pain (Bilateral) 11/29/2016  . Lumbar facet syndrome (Right) 11/29/2016  . Long term prescription benzodiazepine use 11/28/2016  . Chronic pain syndrome 11/28/2016  . Long term (current) use of opiate analgesic 11/28/2016  . Long term prescription opiate use 11/28/2016  . Opiate use 11/28/2016  . Failed back surgical syndrome 11/28/2016  . Chronic low back pain (Location of Primary Source of Pain) (Bilateral) (R>L) 11/28/2016  . Lumbar radiculitis (Right) 11/28/2016  . Chronic lower extremity pain (Bilateral) (R>L) 11/28/2016  . Chronic lumbar radicular pain (Bilateral) (R>L) 11/28/2016  . Chronic neck pain (Location of Secondary source of pain) (Bilateral) (R>L) 11/28/2016  . DDD (degenerative disc disease), lumbar  11/28/2016  . DDD (degenerative disc disease), cervical 11/28/2016  . Elevated TSH 07/08/2015  . Acid reflux 11/19/2014  . Depression, recurrent ([place])   . Hyperlipidemia   . Hypertension   . Postlaminectomy syndrome, lumbar region 11/17/2014   Past Medical History:  Diagnosis Date  . Acute rhinosinusitis 07/23/2019  . Chronic kidney disease   . Depression   . Failed back syndrome, lumbar   . Hyperlipidemia   . Hypertension   . Osteoporosis   . Weight loss    Relevant past medical, surgical, family and social history reviewed and updated as indicated. Interim medical history since our last visit reviewed.  Review of Systems  Constitutional: Positive for fatigue and fever. Negative for activity change.  HENT: Positive for congestion, postnasal drip, rhinorrhea, sinus pressure, sinus pain, sneezing and sore throat. Negative for dental problem, drooling, ear discharge, ear pain and facial swelling.   Eyes: Negative.  Negative for visual disturbance.  Respiratory: Positive for cough, chest tightness, shortness of breath and wheezing.   Cardiovascular: Positive for chest pain. Negative for palpitations and leg swelling.  Gastrointestinal: Negative.  Negative for diarrhea, nausea and vomiting.  Genitourinary: Positive for decreased urine volume, dysuria, frequency and urgency. Negative for difficulty urinating, dyspareunia, flank pain, genital sores, hematuria, pelvic pain, vaginal bleeding, vaginal discharge and vaginal pain.  Musculoskeletal: Negative.   Skin: Positive for rash. Negative for color change and wound.  Neurological: Positive for headaches. Negative for dizziness and light-headedness.  Psychiatric/Behavioral: Positive for sleep disturbance. Negative for agitation, behavioral problems, confusion, decreased concentration, dysphoric mood, self-injury and suicidal ideas. The patient is nervous/anxious.     Per HPI unless specifically indicated above     Objective:    BP  (!) 222/102 (BP Location: Left Arm, Cuff Size: Normal)   Pulse 71   Temp 98.2 F (36.8 C) (Oral)   Resp 18   Ht 5\' 7"  (1.702 m)   Wt 115 lb (52.2 kg)   SpO2 98%   BMI 18.01 kg/m   Wt Readings from Last 3 Encounters:  04/20/20 115 lb (52.2 kg)  03/23/20 104 lb 6.4 oz (47.4 kg)  09/16/19 98 lb (44.5 kg)    Physical Exam Vitals and nursing note reviewed.  Constitutional:      General: She is not in acute distress.    Appearance: Normal appearance. She is not toxic-appearing.  HENT:     Head: Normocephalic and atraumatic.     Right Ear: External ear normal.     Left Ear: External ear normal.     Nose: Congestion present. No rhinorrhea.     Mouth/Throat:     Mouth: Mucous membranes are moist.  Pharynx: Oropharynx is clear. No oropharyngeal exudate or posterior oropharyngeal erythema.  Eyes:     General: No scleral icterus.       Right eye: No discharge.        Left eye: No discharge.     Extraocular Movements: Extraocular movements intact.     Pupils: Pupils are equal, round, and reactive to light.  Neck:     Vascular: No carotid bruit.  Cardiovascular:     Rate and Rhythm: Normal rate and regular rhythm.  Pulmonary:     Effort: Pulmonary effort is normal. No respiratory distress.     Breath sounds: Decreased air movement present.  Abdominal:     General: Abdomen is flat. Bowel sounds are normal. There is no distension.     Palpations: Abdomen is soft. There is no mass.     Tenderness: There is no right CVA tenderness or left CVA tenderness.  Musculoskeletal:        General: Normal range of motion.     Cervical back: Normal range of motion. No rigidity.     Right lower leg: No edema.     Left lower leg: No edema.  Lymphadenopathy:     Cervical: No cervical adenopathy.  Skin:    General: Skin is warm and dry.     Capillary Refill: Capillary refill takes less than 2 seconds.     Coloration: Skin is not jaundiced or pale.     Findings: No erythema.  Neurological:      General: No focal deficit present.     Mental Status: She is alert and oriented to person, place, and time.     Sensory: No sensory deficit.     Motor: No weakness.     Coordination: Coordination normal.     Gait: Gait normal.  Psychiatric:        Mood and Affect: Mood normal.        Behavior: Behavior normal.        Thought Content: Thought content normal.        Judgment: Judgment normal.     Results for orders placed or performed in visit on 04/20/20  WET PREP FOR [place], YEAST, CLUE   Specimen: Vaginal; Sterile Swab   Sterile Swab  Result Value Ref Range   Trichomonas Exam Negative Negative   Yeast Exam Negative Negative   Clue Cell Exam Negative Negative  Microscopic Examination   Urine  Result Value Ref Range   WBC, UA 0-5 0 - 5 /hpf   RBC 0-2 0 - 2 /hpf   Epithelial Cells (non renal) 0-10 0 - 10 /hpf   Bacteria, UA Many (A) None seen/Few  Urinalysis, Routine w reflex microscopic  Result Value Ref Range   Specific Gravity, UA 1.015 1.005 - 1.030   pH, UA 5.5 5.0 - 7.5   Color, UA Yellow Yellow   Appearance Ur Hazy (A) Clear   Leukocytes,UA Negative Negative   Protein,UA Negative Negative/Trace   Glucose, UA Negative Negative   Ketones, UA Negative Negative   RBC, UA 1+ (A) Negative   Bilirubin, UA Negative Negative   Urobilinogen, Ur 0.2 0.2 - 1.0 mg/dL   Nitrite, UA Positive (A) Negative   Microscopic Examination See below:       Assessment & Plan:   Problem List Items Addressed This Visit      Cardiovascular and Mediastinum   Hypertension    Chronic, ongoing.  BP persistently and significantly elevated in office today.  No  red flags or abnormal neurological findings on examination.  Will restart amlodipine 5 mg daily and closely follow up in 2 days.  Encouraged patient to monitor BP at home and notify clinic if less than 110/60.      Relevant Medications   amLODipine (NORVASC) 5 MG tablet     Other   Depression, recurrent (HCC) - Primary     Chronic, improving.  PHQ-9 and GAD-7 improved today.  No SI/HI.  Continue sertraline 150 mg daily and seldom use of clonazepam.  Has CCM SW appointment set up for future and will consider ongoing counseling.        UTI symptoms    Acute, ongoing x 3-4 weeks.  UA today + nitrites.  Wet prep negative for yeast, clue cells, or trichomonas.  Will send urine for culture and follow.  With allergy to penicillin, sulfa, and fluoroquinolones, will start doxycycline 100 mg bid x 10 days to also cover for potential pneumonia.        Relevant Orders   Urinalysis, Routine w reflex microscopic (Completed)   Urine Culture   Cough    Acute, ongoing x at least 4 weeks.  Will obtain imaging with x-ray to rule out pneumonia.  Patient is a smoker. Continue inhaler regimen and increase hydration with water.  If cough continues to persist >8 weeks, consider Pulmonary referral.      Relevant Orders   DG Chest 2 View    Other Visit Diagnoses    Vaginal discharge       Relevant Orders   WET PREP FOR [place], [place] (Completed)       Follow up plan: Return in about 2 days (around 04/22/2020) for BP recheck, 2 months for chronic disease.   30+ minutes spent with the patient today.

## 2020-04-20 NOTE — Assessment & Plan Note (Signed)
Chronic, improving.  PHQ-9 and GAD-7 improved today.  No SI/HI.  Continue sertraline 150 mg daily and seldom use of clonazepam.  Has CCM SW appointment set up for future and will consider ongoing counseling.

## 2020-04-20 NOTE — Patient Instructions (Signed)
DASH Eating Plan DASH stands for "Dietary Approaches to Stop Hypertension." The DASH eating plan is a healthy eating plan that has been shown to reduce high blood pressure (hypertension). It may also reduce your risk for type 2 diabetes, heart disease, and stroke. The DASH eating plan may also help with weight loss. What are tips for following this plan?  General guidelines  Avoid eating more than 2,300 mg (milligrams) of salt (sodium) a day. If you have hypertension, you may need to reduce your sodium intake to 1,500 mg a day.  Limit alcohol intake to no more than 1 drink a day for nonpregnant women and 2 drinks a day for men. One drink equals 12 oz of beer, 5 oz of wine, or 1 oz of hard liquor.  Work with your health care provider to maintain a healthy body weight or to lose weight. Ask what an ideal weight is for you.  Get at least 30 minutes of exercise that causes your heart to beat faster (aerobic exercise) most days of the week. Activities may include walking, swimming, or biking.  Work with your health care provider or diet and nutrition specialist (dietitian) to adjust your eating plan to your individual calorie needs. Reading food labels   Check food labels for the amount of sodium per serving. Choose foods with less than 5 percent of the Daily Value of sodium. Generally, foods with less than 300 mg of sodium per serving fit into this eating plan.  To find whole grains, look for the word "whole" as the first word in the ingredient list. Shopping  Buy products labeled as "low-sodium" or "no salt added."  Buy fresh foods. Avoid canned foods and premade or frozen meals. Cooking  Avoid adding salt when cooking. Use salt-free seasonings or herbs instead of table salt or sea salt. Check with your health care provider or pharmacist before using salt substitutes.  Do not fry foods. Cook foods using healthy methods such as baking, boiling, grilling, and broiling instead.  Cook with  heart-healthy oils, such as olive, canola, soybean, or sunflower oil. Meal planning  Eat a balanced diet that includes: ? 5 or more servings of fruits and vegetables each day. At each meal, try to fill half of your plate with fruits and vegetables. ? Up to 6-8 servings of whole grains each day. ? Less than 6 oz of lean meat, poultry, or fish each day. A 3-oz serving of meat is about the same size as a deck of cards. One egg equals 1 oz. ? 2 servings of low-fat dairy each day. ? A serving of nuts, seeds, or beans 5 times each week. ? Heart-healthy fats. Healthy fats called Omega-3 fatty acids are found in foods such as flaxseeds and coldwater fish, like sardines, salmon, and mackerel.  Limit how much you eat of the following: ? Canned or prepackaged foods. ? Food that is high in trans fat, such as fried foods. ? Food that is high in saturated fat, such as fatty meat. ? Sweets, desserts, sugary drinks, and other foods with added sugar. ? Full-fat dairy products.  Do not salt foods before eating.  Try to eat at least 2 vegetarian meals each week.  Eat more home-cooked food and less restaurant, buffet, and fast food.  When eating at a restaurant, ask that your food be prepared with less salt or no salt, if possible. What foods are recommended? The items listed may not be a complete list. Talk with your dietitian about   what dietary choices are best for you. Grains Whole-grain or whole-wheat bread. Whole-grain or whole-wheat pasta. Brown rice. Oatmeal. Quinoa. Bulgur. Whole-grain and low-sodium cereals. Pita bread. Low-fat, low-sodium crackers. Whole-wheat flour tortillas. Vegetables Fresh or frozen vegetables (raw, steamed, roasted, or grilled). Low-sodium or reduced-sodium tomato and vegetable juice. Low-sodium or reduced-sodium tomato sauce and tomato paste. Low-sodium or reduced-sodium canned vegetables. Fruits All fresh, dried, or frozen fruit. Canned fruit in natural juice (without  added sugar). Meat and other protein foods Skinless chicken or turkey. Ground chicken or turkey. Pork with fat trimmed off. Fish and seafood. Egg whites. Dried beans, peas, or lentils. Unsalted nuts, nut butters, and seeds. Unsalted canned beans. Lean cuts of beef with fat trimmed off. Low-sodium, lean deli meat. Dairy Low-fat (1%) or fat-free (skim) milk. Fat-free, low-fat, or reduced-fat cheeses. Nonfat, low-sodium ricotta or cottage cheese. Low-fat or nonfat yogurt. Low-fat, low-sodium cheese. Fats and oils Soft margarine without trans fats. Vegetable oil. Low-fat, reduced-fat, or light mayonnaise and salad dressings (reduced-sodium). Canola, safflower, olive, soybean, and sunflower oils. Avocado. Seasoning and other foods Herbs. Spices. Seasoning mixes without salt. Unsalted popcorn and pretzels. Fat-free sweets. What foods are not recommended? The items listed may not be a complete list. Talk with your dietitian about what dietary choices are best for you. Grains Baked goods made with fat, such as croissants, muffins, or some breads. Dry pasta or rice meal packs. Vegetables Creamed or fried vegetables. Vegetables in a cheese sauce. Regular canned vegetables (not low-sodium or reduced-sodium). Regular canned tomato sauce and paste (not low-sodium or reduced-sodium). Regular tomato and vegetable juice (not low-sodium or reduced-sodium). Pickles. Olives. Fruits Canned fruit in a light or heavy syrup. Fried fruit. Fruit in cream or butter sauce. Meat and other protein foods Fatty cuts of meat. Ribs. Fried meat. Bacon. Sausage. Bologna and other processed lunch meats. Salami. Fatback. Hotdogs. Bratwurst. Salted nuts and seeds. Canned beans with added salt. Canned or smoked fish. Whole eggs or egg yolks. Chicken or turkey with skin. Dairy Whole or 2% milk, cream, and half-and-half. Whole or full-fat cream cheese. Whole-fat or sweetened yogurt. Full-fat cheese. Nondairy creamers. Whipped toppings.  Processed cheese and cheese spreads. Fats and oils Butter. Stick margarine. Lard. Shortening. Ghee. Bacon fat. Tropical oils, such as coconut, palm kernel, or palm oil. Seasoning and other foods Salted popcorn and pretzels. Onion salt, garlic salt, seasoned salt, table salt, and sea salt. Worcestershire sauce. Tartar sauce. Barbecue sauce. Teriyaki sauce. Soy sauce, including reduced-sodium. Steak sauce. Canned and packaged gravies. Fish sauce. Oyster sauce. Cocktail sauce. Horseradish that you find on the shelf. Ketchup. Mustard. Meat flavorings and tenderizers. Bouillon cubes. Hot sauce and Tabasco sauce. Premade or packaged marinades. Premade or packaged taco seasonings. Relishes. Regular salad dressings. Where to find more information:  National Heart, Lung, and Blood Institute: www.nhlbi.nih.gov  American Heart Association: www.heart.org Summary  The DASH eating plan is a healthy eating plan that has been shown to reduce high blood pressure (hypertension). It may also reduce your risk for type 2 diabetes, heart disease, and stroke.  With the DASH eating plan, you should limit salt (sodium) intake to 2,300 mg a day. If you have hypertension, you may need to reduce your sodium intake to 1,500 mg a day.  When on the DASH eating plan, aim to eat more fresh fruits and vegetables, whole grains, lean proteins, low-fat dairy, and heart-healthy fats.  Work with your health care provider or diet and nutrition specialist (dietitian) to adjust your eating plan to your   individual calorie needs. This information is not intended to replace advice given to you by your health care provider. Make sure you discuss any questions you have with your health care provider. Document Revised: 04/28/2017 Document Reviewed: 05/09/2016 Elsevier Patient Education  2020 [employer] Inc.  

## 2020-04-20 NOTE — Assessment & Plan Note (Signed)
Chronic, ongoing.  BP persistently and significantly elevated in office today.  No red flags or abnormal neurological findings on examination.  Will restart amlodipine 5 mg daily and closely follow up in 2 days.  Encouraged patient to monitor BP at home and notify clinic if less than 110/60.

## 2020-04-20 NOTE — Assessment & Plan Note (Signed)
Acute, ongoing x at least 4 weeks.  Will obtain imaging with x-ray to rule out pneumonia.  Patient is a smoker. Continue inhaler regimen and increase hydration with water.  If cough continues to persist >8 weeks, consider Pulmonary referral.

## 2020-04-20 NOTE — Assessment & Plan Note (Signed)
Acute, ongoing x 3-4 weeks.  UA today + nitrites.  Wet prep negative for yeast, clue cells, or trichomonas.  Will send urine for culture and follow.  With allergy to penicillin, sulfa, and fluoroquinolones, will start doxycycline 100 mg bid x 10 days to also cover for potential pneumonia.

## 2020-04-21 ENCOUNTER — Telehealth: Payer: Self-pay

## 2020-04-21 NOTE — Chronic Care Management (AMB) (Signed)
  Care Management   Note  04/21/2020 Name: [name] MRN: [id number] DOB: Sep 10, 1955  Caitlyn May is a 64 y.o. year old female who is a primary care patient of Volney American, Hershal Coria and is actively engaged with the care management team. I reached out to [facility] by phone today to assist with re-scheduling a follow up visit with the Pharmacist  Follow up plan: Unsuccessful telephone outreach attempt made. The care management team will reach out to the patient again over the next 4 days.  If patient returns call to provider office, please advise to call [facility]  at [place], [place] [employer], [place], [place] [address] Direct Dial: [phone] [email or web address] Website: [email or web address]

## 2020-04-22 ENCOUNTER — Telehealth: Payer: Medicare HMO

## 2020-04-22 ENCOUNTER — Ambulatory Visit: Payer: Medicare HMO | Admitting: Unknown Physician Specialty

## 2020-04-23 LAB — URINE CULTURE

## 2020-04-28 ENCOUNTER — Other Ambulatory Visit: Payer: Self-pay | Admitting: Family Medicine

## 2020-04-28 NOTE — Chronic Care Management (AMB) (Signed)
  Care Management   Note  04/28/2020 Name: Caitlyn May MRN: [id number] DOB: Jan 03, 1956  Caitlyn May is a 64 y.o. year old female who is a primary care patient of Volney American, Hershal Coria and is actively engaged with the care management team. I reached out to [name] by phone today to assist with re-scheduling an initial visit with the Licensed Clinical Social Worker  Follow up plan: Telephone appointment with care management team member scheduled for:05/20/2020  [facility]

## 2020-04-28 NOTE — Telephone Encounter (Signed)
Please review for refill. Refill not delegated per protocol.  Last refill:03/23/20 #60

## 2020-04-28 NOTE — Telephone Encounter (Signed)
Pt called in to request a refill for clonazePAM Bobbye Charleston) 0.5 MG tablet    Pharmacy:  [facility], [address] [place] Phone:  [phone]  Fax:  [phone]

## 2020-04-28 NOTE — Telephone Encounter (Signed)
Routing to provider  

## 2020-04-29 NOTE — Progress Notes (Signed)
SUBJECTIVE:   CHIEF COMPLAINT / HPI:   Hypertension: - Medications: benazepril 40mg , metoprolol 50mg  daily, amlodipine 5mg  daily (restarted at last visit d/t SBP 200s) - Compliance: good - Checking BP at home: yes, can't remember numbers but knows she was within "mild range" - endorses some dull chest pressure for a few weeks, fluctuating. Does not radiate. Not exacerbated by activity. Exacerbated by coughing though cough and SOB have improved. Some nausea, no vomiting. Breathing the same. Some swelling of legs last week, not now. No vision changes. Occasional lightheadedness with standing.  - 1ppd tobacco, 50 years - cardiac risk factors: HLD, tobacco use  Depression - Medications: zoloft 150mg  (recent increase last week), klonopin 0.5mg  BID prn - Taking: klonopin 1-2 times per day. - previously on valium, xanax. - Symptom severity: moderate - Duration of current tx: years with good compliance - Counseling: no, previously saw psychiatry, wants to reestablish. - Previous hospitalizations: no - FH of psych illness: mom with depression - Current stressors: upcoming holidays - Coping Mechanisms: none identified but likes to play with her dogs. - feels better after increasing zoloft last week - Symptoms: Depressed mood: yes Anxious mood: yes Anhedonia: yes Significant weight loss or gain: no, has had weight loss previously. Insomnia: yes hard to fall asleep Fatigue: yes Feelings of worthlessness or guilt: yes, guilt Impaired concentration/indecisiveness: yes Suicidal ideations: no Hopelessness: no Crying spells: yes  Depression screen [facility] : [facility] 2/9 04/20/2020 03/23/2020 09/16/2019  Decreased Interest 1 3 2   Down, Depressed, Hopeless 1 3 2   PHQ - 2 Score 2 6 4   Altered sleeping 1 2 2   Tired, decreased energy 3 3 3   Change in appetite 1 0 3  Feeling bad or failure about yourself  1 1 1   Trouble concentrating 1 0 2  Moving slowly or fidgety/restless 0 0 1  Suicidal thoughts 0 0 0   PHQ-9 Score 9 12 16   Difficult doing work/chores Somewhat difficult Not difficult at all Not difficult at all  Some recent data might be hidden  *refused PHQ9 today.  Cough - seen previously 11/22 for 4wk h/o productive cough and URI sx. Doxycycline given for potential PNA, also with UTI at that time, finishes in 3 days. CXR with emphysema. - on Breztri, albuterol prn, singulair. Using albuterol once per day.  - feeling better.    PERTINENT  PMH / PSH: HTN, COPD/asthma, chronic pain, DDD, anxiety/depression, HLD  OBJECTIVE:   BP (!) 170/96    Pulse 66    Temp 98.4 F (36.9 C) (Oral)    Wt 111 lb (50.3 kg)    SpO2 97%    BMI 17.39 kg/m   Gen: elderly, in NAD Cardiac: RRR, no murmur Lungs: CTAB, no wheeze/rales. Comfortable WOB on RA Ext: WWP, no edema Psych: appropriately dressed and well groomed. No flight of ideas or tangential thought process.   ASSESSMENT/PLAN:   Hypertension Improved with addition of amlodipine but still above goal, will increase to 10mg .  Obtaining BMP today. EKG obtained today due to chest pressure though likely related to recent cough, sinus rhythm and unchanged from prior. Potentially some BP discrepancy with readings at home, advised to f/u next week for BP check and to bring BP cuff for review and calibration.  COPD (chronic obstructive pulmonary disease) (HCC) Breathing improved. No med changes made today.   Depression, recurrent ([place]) Improved with titration of zoloft last week, will give additional few weeks to see full effect. Much discussion had today regarding coping mechanisms  and judicious use of klonopin. Referral made today to psychiatry.    Myles Gip, DO

## 2020-04-30 ENCOUNTER — Other Ambulatory Visit: Payer: Self-pay

## 2020-04-30 ENCOUNTER — Ambulatory Visit (INDEPENDENT_AMBULATORY_CARE_PROVIDER_SITE_OTHER): Payer: Medicare HMO | Admitting: Family Medicine

## 2020-04-30 ENCOUNTER — Encounter: Payer: Self-pay | Admitting: Family Medicine

## 2020-04-30 VITALS — BP 170/96 | HR 66 | Temp 98.4°F | Wt 111.0 lb

## 2020-04-30 DIAGNOSIS — R0789 Other chest pain: Secondary | ICD-10-CM | POA: Diagnosis not present

## 2020-04-30 DIAGNOSIS — J449 Chronic obstructive pulmonary disease, unspecified: Secondary | ICD-10-CM

## 2020-04-30 DIAGNOSIS — I10 Essential (primary) hypertension: Secondary | ICD-10-CM

## 2020-04-30 DIAGNOSIS — F339 Major depressive disorder, recurrent, unspecified: Secondary | ICD-10-CM | POA: Diagnosis not present

## 2020-04-30 MED ORDER — AMLODIPINE BESYLATE 10 MG PO TABS: 10.0000 mg | ORAL_TABLET | Freq: Every day | ORAL | 0 refills | Status: DC

## 2020-04-30 MED ORDER — CLONAZEPAM 0.5 MG PO TABS
0.5000 mg | ORAL_TABLET | Freq: Two times a day (BID) | ORAL | 0 refills | Status: DC | PRN
Start: 2020-04-30 — End: 2020-05-19

## 2020-04-30 NOTE — Assessment & Plan Note (Signed)
Breathing improved. No med changes made today.

## 2020-04-30 NOTE — Assessment & Plan Note (Addendum)
Improved with addition of amlodipine but still above goal, will increase to 10mg .  Obtaining BMP today. EKG obtained today due to chest pressure though likely related to recent cough, sinus rhythm and unchanged from prior. Potentially some BP discrepancy with readings at home, advised to f/u next week for BP check and to bring BP cuff for review and calibration.

## 2020-04-30 NOTE — Assessment & Plan Note (Signed)
Improved with titration of zoloft last week, will give additional few weeks to see full effect. Much discussion had today regarding coping mechanisms and judicious use of klonopin. Referral made today to psychiatry.

## 2020-04-30 NOTE — Patient Instructions (Addendum)
It was great to see you!  Our plans for today:   - Your EKG did not show any signs of heart damage and was the same as prior.  - We are increasing your amlodipine. Take 2 pills of your 5mg  tablets to reach 10mg . I sent a new prescription for 10mg  tablets to the pharmacy.  - I'm glad your breathing and depression is a little better! No changes to those medications today.  - I sent a referral for psychiatry.  - Come back next week for a blood pressure check.   We are checking some labs today, we will let you know these results.  Take care and seek immediate care sooner if you develop any concerns.   Dr. Ky Barban

## 2020-05-01 LAB — BASIC METABOLIC PANEL
BUN/Creatinine Ratio: 14 (ref 12–28)
BUN: 16 mg/dL (ref 8–27)
CO2: 18 mmol/L — ABNORMAL LOW (ref 20–29)
Calcium: 9.4 mg/dL (ref 8.7–10.3)
Chloride: 107 mmol/L — ABNORMAL HIGH (ref 96–106)
Creatinine, Ser: 1.15 mg/dL — ABNORMAL HIGH (ref 0.57–1.00)
GFR calc Af Amer: 58 mL/min/{1.73_m2} — ABNORMAL LOW (ref >59)
GFR calc non Af Amer: 50 mL/min/{1.73_m2} — ABNORMAL LOW (ref >59)
Glucose: 87 mg/dL (ref 65–99)
Potassium: 4.3 mmol/L (ref 3.5–5.2)
Sodium: 141 mmol/L (ref 134–144)

## 2020-05-02 ENCOUNTER — Encounter: Payer: Self-pay | Admitting: Family Medicine

## 2020-05-06 NOTE — Progress Notes (Deleted)
    SUBJECTIVE:   CHIEF COMPLAINT / HPI:   Past Medical History:  Diagnosis Date  . Acute rhinosinusitis 07/23/2019  . Chronic kidney disease   . Depression   . Failed back syndrome, lumbar   . Hyperlipidemia   . Hypertension   . Osteoporosis   . Weight loss    Hypertension: - Medications: amlodipine 10mg , benazepril 40mg  - Compliance: *** - Checking BP at home: *** - Denies any SOB, CP, vision changes, LE edema, medication SEs, or symptoms of hypotension - Diet: *** - Exercise: ***  ***did she bring cuff? ***consider switching to ARB for cough if persistent   OBJECTIVE:   There were no vitals taken for this visit.  ***  ASSESSMENT/PLAN:   No problem-specific Assessment & Plan notes found for this encounter.     Myles Gip, DO [place]

## 2020-05-07 ENCOUNTER — Ambulatory Visit: Payer: Medicare HMO | Admitting: Family Medicine

## 2020-05-07 NOTE — Chronic Care Management (AMB) (Signed)
  Care Management   Note  05/07/2020 Name: Caitlyn May MRN: [id number] DOB: 10-10-1955  Caitlyn May is a 63 y.o. year old female who is a primary care patient of Volney American, Hershal Coria and is actively engaged with the care management team. I reached out to [facility] by phone today to assist with re-scheduling an initial visit with the Pharmacist  Follow up plan: Unsuccessful telephone outreach attempt made.The care management team will reach out to the patient again over the next 7 days.  If patient returns call to provider office, please advise to call [facility]  at [place], [address], [place], [place] [address] Direct Dial: [phone] [email or web address]: [email or web address]

## 2020-05-12 ENCOUNTER — Telehealth: Payer: Self-pay

## 2020-05-12 NOTE — Chronic Care Management (AMB) (Signed)
  Care Management   Note  05/12/2020 Name: [name] MRN: [id number] DOB: March 08, 1956  Caitlyn May is a 64 y.o. year old female who is a primary care patient of Volney American, Hershal Coria and is actively engaged with the care management team. I reached out to [facility] by phone today to assist with re-scheduling an initial visit with the Pharmacist  Follow up plan: Unable to make contact on outreach attempts x 3. PCP Noemi Chapel, NP  notified via routed documentation in medical record.   Noreene Larsson, [place], [place], [place] [address] Direct Dial: [phone] [email or web address] Website: [email or web address]

## 2020-05-12 NOTE — Telephone Encounter (Signed)
PT' came in office and was confused about when her apt was, Pt thought it was today but was scheduled for next week, Pt stormed off while I was still speaking to her and trying to resolve the confusion,later called pt to try to schedule due to cancellation for tomorrow pt did not allow me to finish speaking to her when she hung up on me, Pt stated she would be going elsewhere,Did not allow me to ask more questions or give her any resolution,Unclear if pt would like to cancel apt or is she is looking for new PCP if Pt calls back Please ask if she is still coming to apt and would like to still be our PT.

## 2020-05-15 ENCOUNTER — Other Ambulatory Visit: Payer: Self-pay

## 2020-05-15 MED ORDER — AMLODIPINE BESYLATE 10 MG PO TABS
10.0000 mg | ORAL_TABLET | Freq: Every day | ORAL | 0 refills | Status: DC
Start: 2020-05-15 — End: 2020-06-18

## 2020-05-18 ENCOUNTER — Other Ambulatory Visit: Payer: Self-pay

## 2020-05-18 NOTE — Progress Notes (Signed)
   SUBJECTIVE:   CHIEF COMPLAINT / HPI:   Past Medical History:  Diagnosis Date  . Acute rhinosinusitis 07/23/2019  . Chronic kidney disease   . Depression   . Failed back syndrome, lumbar   . Hyperlipidemia   . Hypertension   . Osteoporosis   . Weight loss    Hypertension: - Medications: benazepril 40mg , metoprolol 50mg  daily, amlodipine 10mg  daily (increased at last visit) - Compliance:  - Checking BP at home: yes, 111-187 - Denies any SOB, CP, vision changes, LE edema, medication SEs - some lightheadedness.   URINARY SYMPTOMS - got better after abx, returned a few weeks ago. Dysuria: burning Urinary frequency: yes Urgency: yes Hematuria: no Abdominal pain: no Back pain: not new Suprapubic pain/pressure: no Flank pain: no Fever:  no Vomiting: no Relief with cranberry juice: hasn't tried Relief with pyridium: hasn't tried Status: stable Previous urinary tract infection: yes Recurrent urinary tract infection: no Sexual activity: No sexually active History of sexually transmitted disease: no Treatments attempted: none  No abnl vaginal discharge.   Depression - Medications: zoloft 150mg , klonopin 0.5mg  BID prn  - previously on valium, xanax - Taking: ran out of klonopin, took a few valium she got from a friend. - Counseling: made referral to psych 12/2, hasn't been set up yet. Didn't think phone could do virtual format, now realizes it can. Wants to be set back up with appt. - Previous hospitalizations: no - FH of psych illness: mom with depression - Current stressors: upcoming holidays - Coping Mechanisms: dogs   OBJECTIVE:   BP (!) 150/82   Pulse 91   Temp 97.8 F (36.6 C)   Wt 113 lb 8 oz (51.5 kg)   SpO2 99%   BMI 17.78 kg/m   Gen: chronically ill appearing, in NAD Card: RRR, no murmur Lungs: CTAB Abd: no suprapubic tenderness Ext: WWP, no edema  ASSESSMENT/PLAN:   Hypertension Uncontrolled and with wide fluctuations in at home measurements.  Some orthostatic symptoms as well. Would likely benefit from continuous ambulatory BP monitoring to establish patterns, will refer to Cardiology for further evaluation.   Depression, recurrent ([place]) Stable but with continued use of benzos, explained this would not be long term rx. Referred to psych, hasn't been able to get in yet, will have patient contact office to get scheduled.   UTI symptoms Acute. UA with trace blood but negative for infection. Will have patient reduce irritants and repeat UA on followup. If symptoms and hematuria persistent despite this, consider urology referral.    Myles Gip, DO

## 2020-05-19 ENCOUNTER — Encounter: Payer: Self-pay | Admitting: Family Medicine

## 2020-05-19 ENCOUNTER — Other Ambulatory Visit: Payer: Self-pay

## 2020-05-19 ENCOUNTER — Ambulatory Visit (INDEPENDENT_AMBULATORY_CARE_PROVIDER_SITE_OTHER): Payer: Medicare HMO | Admitting: Family Medicine

## 2020-05-19 VITALS — BP 150/82 | HR 91 | Temp 97.8°F | Wt 113.5 lb

## 2020-05-19 DIAGNOSIS — R399 Unspecified symptoms and signs involving the genitourinary system: Secondary | ICD-10-CM | POA: Diagnosis not present

## 2020-05-19 DIAGNOSIS — F339 Major depressive disorder, recurrent, unspecified: Secondary | ICD-10-CM

## 2020-05-19 DIAGNOSIS — R300 Dysuria: Secondary | ICD-10-CM | POA: Diagnosis not present

## 2020-05-19 DIAGNOSIS — Z13820 Encounter for screening for osteoporosis: Secondary | ICD-10-CM | POA: Diagnosis not present

## 2020-05-19 DIAGNOSIS — I10 Essential (primary) hypertension: Secondary | ICD-10-CM

## 2020-05-19 DIAGNOSIS — Z1239 Encounter for other screening for malignant neoplasm of breast: Secondary | ICD-10-CM | POA: Diagnosis not present

## 2020-05-19 LAB — URINALYSIS, ROUTINE W REFLEX MICROSCOPIC
Bilirubin, UA: NEGATIVE
Glucose, UA: NEGATIVE
Ketones, UA: NEGATIVE
Leukocytes,UA: NEGATIVE
Nitrite, UA: NEGATIVE
Protein,UA: NEGATIVE
Specific Gravity, UA: 1.01 (ref 1.005–1.030)
Urobilinogen, Ur: 0.2 mg/dL (ref 0.2–1.0)
pH, UA: 5.5 (ref 5.0–7.5)

## 2020-05-19 LAB — MICROSCOPIC EXAMINATION
Bacteria, UA: NONE SEEN
WBC, UA: NONE SEEN /hpf (ref 0–5)

## 2020-05-19 MED ORDER — CLONAZEPAM 0.5 MG PO TABS: 0.5000 mg | ORAL_TABLET | Freq: Two times a day (BID) | ORAL | 0 refills | Status: DC | PRN

## 2020-05-19 NOTE — Patient Instructions (Signed)
It was great to see you!  Our plans for today:  - We are referring you to cardiology for your blood pressure. - We will work to get you back into Psychiatry.  - We will call you with the results of your urine.   Take care and seek immediate care sooner if you develop any concerns.   Dr. Ky Barban

## 2020-05-19 NOTE — Assessment & Plan Note (Signed)
Uncontrolled and with wide fluctuations in at home measurements. Some orthostatic symptoms as well. Would likely benefit from continuous ambulatory BP monitoring to establish patterns, will refer to Cardiology for further evaluation.

## 2020-05-19 NOTE — Assessment & Plan Note (Addendum)
Acute. UA with trace blood but negative for infection. Will have patient reduce irritants and repeat UA on followup. If symptoms and hematuria persistent despite this, consider urology referral.

## 2020-05-19 NOTE — Assessment & Plan Note (Signed)
Stable but with continued use of benzos, explained this would not be long term rx. Referred to psych, hasn't been able to get in yet, will have patient contact office to get scheduled.

## 2020-05-20 ENCOUNTER — Ambulatory Visit: Payer: Self-pay

## 2020-05-20 ENCOUNTER — Telehealth: Payer: Self-pay | Admitting: Licensed Clinical Social Worker

## 2020-05-20 NOTE — Progress Notes (Signed)
Erroneous encounter

## 2020-05-20 NOTE — Telephone Encounter (Signed)
Chronic Care Management    Clinical Social Work General Follow Up Note  05/20/2020 Name: Caitlyn May MRN: [id number] DOB: 06/03/1955  Caitlyn May is a 64 y.o. year old female who is a primary care patient of Volney American, [place]. The CCM team was consulted for assistance.  Review of patient status, including review of consultants reports, relevant laboratory and other test results, and collaboration with appropriate care team members and the patient's provider was performed as part of comprehensive patient evaluation and provision of chronic care management services.    LCSW completed CCM outreach attempt today but was unable to reach patient successfully. A HIPPA compliant voice message was left encouraging patient to return call once available if still interested in CCM Social Work support. CCM LCSW has completed 4 unsuccessful outreaches and will close referral at this time. The care management team is available to follow up with the patient after provider conversation with the patient regarding recommendation for care management engagement and subsequent re-referral to the care management team.   Outpatient Encounter Medications as of 05/20/2020  Medication Sig  . amLODipine (NORVASC) 10 MG tablet Take 1 tablet (10 mg total) by mouth daily.  Marland Kitchen atorvastatin (LIPITOR) 80 MG tablet Take 1 tablet (80 mg total) by mouth daily.  . benazepril (LOTENSIN) 40 MG tablet TAKE ONE TABLET BY MOUTH ONCE DAILY  . Budeson-Glycopyrrol-Formoterol (BREZTRI AEROSPHERE) 160-9-4.8 MCG/ACT AERO Inhale 1 puff into the lungs every 6 (six) hours as needed.  . cetirizine (ZYRTEC) 10 MG tablet Take 1 tablet (10 mg total) by mouth daily.  . clonazePAM (KLONOPIN) 0.5 MG tablet Take 1 tablet (0.5 mg total) by mouth 2 (two) times daily as needed for anxiety.  . cyclobenzaprine (FLEXERIL) 5 MG tablet TAKE ONE TABLET BY MOUTH ONCE DAILY AS NEEDED FOR MUSCLE SPASMS  . hydrocortisone 2.5 % ointment Apply  topically 2 (two) times daily. (Patient not taking: Reported on 04/20/2020)  . ipratropium-albuterol (DUONEB) 0.5-2.5 (3) MG/3ML SOLN Take 3 mLs by nebulization every 4 (four) hours as needed.  . megestrol (MEGACE) 40 MG tablet TAKE ONE TABLET BY MOUTH FOUR TIMES DAILY AS NEEDED  . meloxicam (MOBIC) 7.5 MG tablet TAKE ONE TABLET BY MOUTH ONCE DAILY  . metoprolol succinate (TOPROL-XL) 50 MG 24 hr tablet TAKE ONE TABLET BY MOUTH ONCE DAILY WITH A MEAL  . montelukast (SINGULAIR) 10 MG tablet Take 1 tablet (10 mg total) by mouth at bedtime.  . ondansetron (ZOFRAN ODT) 8 MG disintegrating tablet Take 1 tablet (8 mg total) by mouth every 8 (eight) hours as needed for nausea or vomiting.  . pantoprazole (PROTONIX) 40 MG tablet Take 1 tablet by mouth at bedtime.  . sertraline (ZOLOFT) 100 MG tablet Take 1.5 tablets (150 mg total) by mouth daily.  Marland Kitchen triamcinolone (NASACORT AQ) 55 MCG/ACT AERO nasal inhaler Place 2 sprays into the nose daily.  . VENTOLIN HFA 108 (90 Base) MCG/ACT inhaler INHALE 2 PUFFS BY MOUTH INTO THE LUNGS EVERY 6 HOURS AS NEEDED FOR WHEEZING OR SHORTNESS OF BREATH   No facility-administered encounter medications on file as of 05/20/2020.   Follow Up Plan: LCSW has been unable to make contact with the patient for follow up. The care management team is available to follow up with the patient after provider conversation with the patient regarding recommendation for care management engagement and subsequent re-referral to the care management team.    Eula Fried, BSW, MSW, [email or web address]  Phone: [phone]

## 2020-05-20 NOTE — Chronic Care Management (AMB) (Signed)
Erroneous encounter created on 05/20/20

## 2020-05-21 ENCOUNTER — Other Ambulatory Visit: Payer: Self-pay | Admitting: Family Medicine

## 2020-05-27 ENCOUNTER — Telehealth: Payer: Self-pay

## 2020-06-08 ENCOUNTER — Ambulatory Visit: Payer: Medicare HMO | Admitting: Cardiology

## 2020-06-09 ENCOUNTER — Encounter: Payer: Self-pay | Admitting: Cardiology

## 2020-06-09 ENCOUNTER — Ambulatory Visit: Payer: Medicare HMO | Admitting: Cardiology

## 2020-06-09 ENCOUNTER — Other Ambulatory Visit: Payer: Self-pay

## 2020-06-09 VITALS — BP 160/82 | HR 82 | Ht 67.0 in | Wt 114.0 lb

## 2020-06-09 DIAGNOSIS — I10 Essential (primary) hypertension: Secondary | ICD-10-CM | POA: Diagnosis not present

## 2020-06-09 DIAGNOSIS — F17200 Nicotine dependence, unspecified, uncomplicated: Secondary | ICD-10-CM

## 2020-06-09 DIAGNOSIS — E7800 Pure hypercholesterolemia, unspecified: Secondary | ICD-10-CM | POA: Diagnosis not present

## 2020-06-09 MED ORDER — CARVEDILOL 12.5 MG PO TABS: 12.5000 mg | ORAL_TABLET | Freq: Two times a day (BID) | ORAL | 3 refills | Status: DC

## 2020-06-09 NOTE — Progress Notes (Signed)
Cardiology Office Note:    Date:  06/09/2020   ID:  [name], DOB [birth year], MRN [id number]  PCP:  Volney American, PA-C  [place] Cardiologist:  Kate Sable, MD  [place] Electrophysiologist:  None   Referring MD: Myles Gip, DO   Chief Complaint  Patient presents with  . New Patient (Initial Visit)    Establish care with provider for hypertension. Medications verbally reviewed with patient.     History of Present Illness:    Caitlyn May is a 65 y.o. female with a hx of hypertension, hyperlipidemia, depression, current smoker x50+ years, chronic back pain who presents due to difficult to control blood pressures.  Patient states being diagnosed with blood pressure 30 years ago after having back surgery due to spinal stenosis.  She states being diagnosed with hypertension after that and has been on blood pressure medication since.  Also complains of chronic low back pain, saw pain medicine in the past but did not feel much improvement and stopped going.  Has been dealing with chronic low back pain issues, currently has some neck pain.  She thinks her pain might be contributing to her elevated blood pressures.  Blood pressures have been elevated over the past several months.  At one point during a clinic visit to PCP 4 weeks ago, BP was over 200s.  Amlodipine was added to her regimen and titrated to 10 mg daily.  She also takes benazepril and Toprol-XL.  She is compliant with all her meds.  She currently smokes, has cut back to 1 pack/day.  Echocardiogram 12/2016 showed normal systolic function, EF 60 to 71%, normal diastolic function.  Past Medical History:  Diagnosis Date  . Acute rhinosinusitis 07/23/2019  . Chronic kidney disease   . Depression   . Failed back syndrome, lumbar   . Hyperlipidemia   . Hypertension   . Osteoporosis   . Weight loss     Past Surgical History:  Procedure Laterality Date  . ABDOMINAL HYSTERECTOMY    .  CESAREAN SECTION    . MOUTH SURGERY    . SPINE SURGERY      Current Medications: Current Meds  Medication Sig  . amLODipine (NORVASC) 10 MG tablet Take 1 tablet (10 mg total) by mouth daily.  Marland Kitchen atorvastatin (LIPITOR) 80 MG tablet Take 1 tablet (80 mg total) by mouth daily.  . benazepril (LOTENSIN) 40 MG tablet TAKE ONE TABLET BY MOUTH ONCE DAILY  . Budeson-Glycopyrrol-Formoterol (BREZTRI AEROSPHERE) 160-9-4.8 MCG/ACT AERO Inhale 1 puff into the lungs every 6 (six) hours as needed.  . carvedilol (COREG) 12.5 MG tablet Take 1 tablet (12.5 mg total) by mouth 2 (two) times daily.  . cetirizine (ZYRTEC) 10 MG tablet Take 1 tablet (10 mg total) by mouth daily.  . clonazePAM (KLONOPIN) 0.5 MG tablet Take 1 tablet (0.5 mg total) by mouth 2 (two) times daily as needed for anxiety.  . cyclobenzaprine (FLEXERIL) 5 MG tablet TAKE ONE TABLET BY MOUTH ONCE DAILY AS NEEDED FOR MUSCLE SPASMS  . ipratropium-albuterol (DUONEB) 0.5-2.5 (3) MG/3ML SOLN Take 3 mLs by nebulization every 4 (four) hours as needed.  . megestrol (MEGACE) 40 MG tablet TAKE ONE TABLET BY MOUTH FOUR TIMES DAILY AS NEEDED  . meloxicam (MOBIC) 7.5 MG tablet TAKE ONE TABLET BY MOUTH ONCE DAILY  . montelukast (SINGULAIR) 10 MG tablet Take 1 tablet (10 mg total) by mouth at bedtime.  . ondansetron (ZOFRAN ODT) 8 MG disintegrating tablet Take 1 tablet (8 mg total)  by mouth every 8 (eight) hours as needed for nausea or vomiting.  . pantoprazole (PROTONIX) 40 MG tablet Take 1 tablet by mouth at bedtime.  . sertraline (ZOLOFT) 100 MG tablet Take 1.5 tablets (150 mg total) by mouth daily.  . VENTOLIN HFA 108 (90 Base) MCG/ACT inhaler INHALE 2 PUFFS BY MOUTH INTO THE LUNGS EVERY 6 HOURS AS NEEDED FOR WHEEZING OR SHORTNESS OF BREATH  . [DISCONTINUED] metoprolol succinate (TOPROL-XL) 50 MG 24 hr tablet TAKE ONE TABLET BY MOUTH ONCE DAILY WITH A MEAL     Allergies:   Accupril [quinapril hcl], Avelox [moxifloxacin], Ciprofloxacin, Hctz  [hydrochlorothiazide], Penicillins, and Sulfa antibiotics   Social History   Socioeconomic History  . Marital status: Married    Spouse name: Not on file  . Number of children: Not on file  . Years of education: 25  . Highest education level: 10th grade  Occupational History  . Not on file  Tobacco Use  . Smoking status: Current Every Day Smoker    Packs/day: 1.00    Types: Cigarettes  . Smokeless tobacco: Never Used  Vaping Use  . Vaping Use: Never used  Substance and Sexual Activity  . Alcohol use: No  . Drug use: Not Currently    Types: Marijuana    Comment: last week estimated 05/10/2019  . Sexual activity: Not on file  Other Topics Concern  . Not on file  Social History Narrative  . Not on file   Social Determinants of Health   Financial Resource Strain: Not on file  Food Insecurity: Not on file  Transportation Needs: Not on file  Physical Activity: Not on file  Stress: Not on file  Social Connections: Not on file     Family History: The patient's family history includes Cancer in her father and maternal aunt; Dementia in her mother; Hypertension in her mother. There is no history of Diabetes, Heart disease, Stroke, or COPD.  ROS:   Please see the history of present illness.     All other systems reviewed and are negative.  EKGs/Labs/Other Studies Reviewed:    The following studies were reviewed today:   EKG:  EKG is  ordered today.  The ekg ordered today demonstrates sinus rhythm, occasional PVCs.  Recent Labs: 03/23/2020: ALT 15; Hemoglobin 15.7; Platelets 298; TSH 3.270 04/30/2020: BUN 16; Creatinine, Ser 1.15; Potassium 4.3; Sodium 141  Recent Lipid Panel    Component Value Date/Time   CHOL 184 03/23/2020 1444   CHOL 163 12/11/2017 0934   TRIG 122 03/23/2020 1444   TRIG 167 (H) 12/11/2017 0934   HDL 56 03/23/2020 1444   CHOLHDL 2.4 04/14/2017 1608   VLDL 33 (H) 12/11/2017 0934   LDLCALC 106 (H) 03/23/2020 1444     Risk  Assessment/Calculations:      Physical Exam:    VS:  BP (!) 160/82 (BP Location: Right Arm, Patient Position: Sitting, Cuff Size: Normal)   Pulse 82   Ht 5\' 7"  (1.702 m)   Wt 114 lb (51.7 kg)   SpO2 98%   BMI 17.85 kg/m     Wt Readings from Last 3 Encounters:  06/09/20 114 lb (51.7 kg)  05/19/20 113 lb 8 oz (51.5 kg)  04/30/20 111 lb (50.3 kg)     GEN:  Well nourished, well developed in no acute distress HEENT: Normal NECK: No JVD; No carotid bruits LYMPHATICS: No lymphadenopathy CARDIAC: RRR, no murmurs, rubs, gallops RESPIRATORY: Decreased breath sounds bilaterally, no wheezing ABDOMEN: Soft, non-tender, non-distended MUSCULOSKELETAL:  No edema; No deformity  SKIN: Warm and dry NEUROLOGIC:  Alert and oriented x 3 PSYCHIATRIC:  Normal affect   ASSESSMENT:    1. Hypertension, unspecified type   2. Pure hypercholesterolemia   3. Smoking    PLAN:    In order of problems listed above:  1. Uncontrolled hypertension.  Patient allergic to HCTZ, has AKI.  Stop Toprol-XL, start Coreg 12.5 mg twice daily.  Continue benazepril and amlodipine.  Keep BP log at home. 2. Hyperlipidemia, continue Lipitor.  This was recently titrated. 3. Current smoker x50+ years, smoking cessation advised.  Over 5 minutes spent counseling patient.  Follow-up in 4 to 6 weeks.       Medication Adjustments/Labs and Tests Ordered: Current medicines are reviewed at length with the patient today.  Concerns regarding medicines are outlined above.  Orders Placed This Encounter  Procedures  . EKG 12-Lead   Meds ordered this encounter  Medications  . carvedilol (COREG) 12.5 MG tablet    Sig: Take 1 tablet (12.5 mg total) by mouth 2 (two) times daily.    Dispense:  60 tablet    Refill:  3    Patient Instructions  Medication Instructions:   Your physician has recommended you make the following change in your medication:   1.  STOP taking your metoprolol succinate (TOPROL-XL) 50 MG 24 hr  tablet  2.  START taking carvedilol (COREG) 12.5 MG tablet: Take 1 tablet (12.5 mg total) by mouth 2 (two) times daily.  *If you need a refill on your cardiac medications before your next appointment, please call your pharmacy*   Lab Work: None Ordered If you have labs (blood work) drawn today and your tests are completely normal, you will receive your results only by: Marland Kitchen MyChart Message (if you have MyChart) OR . A paper copy in the mail If you have any lab test that is abnormal or we need to change your treatment, we will call you to review the results.   Testing/Procedures: None Ordered   Follow-Up: At [facility], you and your health needs are our priority.  As part of our continuing mission to provide you with exceptional heart care, we have created designated Provider Care Teams.  These Care Teams include your primary Cardiologist (physician) and Advanced Practice Providers (APPs -  Physician Assistants and Nurse Practitioners) who all work together to provide you with the care you need, when you need it.  We recommend signing up for the patient portal called "MyChart".  Sign up information is provided on this After Visit Summary.  MyChart is used to connect with patients for Virtual Visits (Telemedicine).  Patients are able to view lab/test results, encounter notes, upcoming appointments, etc.  Non-urgent messages can be sent to your provider as well.   To learn more about what you can do with MyChart, go to [email or web address].    Your next appointment:   4-6 week(s)  The format for your next appointment:   In Person  Provider:   Kate Sable, MD   Other Instructions      Signed, Kate Sable, MD  06/09/2020 10:01 AM    [place]

## 2020-06-09 NOTE — Patient Instructions (Signed)
Medication Instructions:   Your physician has recommended you make the following change in your medication:   1.  STOP taking your metoprolol succinate (TOPROL-XL) 50 MG 24 hr tablet  2.  START taking carvedilol (COREG) 12.5 MG tablet: Take 1 tablet (12.5 mg total) by mouth 2 (two) times daily.  *If you need a refill on your cardiac medications before your next appointment, please call your pharmacy*   Lab Work: None Ordered If you have labs (blood work) drawn today and your tests are completely normal, you will receive your results only by: Marland Kitchen MyChart Message (if you have MyChart) OR . A paper copy in the mail If you have any lab test that is abnormal or we need to change your treatment, we will call you to review the results.   Testing/Procedures: None Ordered   Follow-Up: At [facility], you and your health needs are our priority.  As part of our continuing mission to provide you with exceptional heart care, we have created designated Provider Care Teams.  These Care Teams include your primary Cardiologist (physician) and Advanced Practice Providers (APPs -  Physician Assistants and Nurse Practitioners) who all work together to provide you with the care you need, when you need it.  We recommend signing up for the patient portal called "MyChart".  Sign up information is provided on this After Visit Summary.  MyChart is used to connect with patients for Virtual Visits (Telemedicine).  Patients are able to view lab/test results, encounter notes, upcoming appointments, etc.  Non-urgent messages can be sent to your provider as well.   To learn more about what you can do with MyChart, go to [email or web address].    Your next appointment:   4-6 week(s)  The format for your next appointment:   In Person  Provider:   Kate Sable, MD   Other Instructions

## 2020-06-11 ENCOUNTER — Ambulatory Visit: Payer: Medicare Other

## 2020-06-12 ENCOUNTER — Ambulatory Visit (INDEPENDENT_AMBULATORY_CARE_PROVIDER_SITE_OTHER): Payer: Medicare HMO

## 2020-06-12 ENCOUNTER — Telehealth: Payer: Self-pay

## 2020-06-12 VITALS — Ht 66.0 in | Wt 115.0 lb

## 2020-06-12 DIAGNOSIS — Z0000 Encounter for general adult medical examination without abnormal findings: Secondary | ICD-10-CM

## 2020-06-12 NOTE — Progress Notes (Signed)
I connected with [name] today by telephone and verified that I am speaking with the correct person using two identifiers. Location patient: home Location provider: work Persons participating in the virtual visit: [name], Glenna Durand LPN.   I discussed the limitations, risks, security and privacy concerns of performing an evaluation and management service by telephone and the availability of in person appointments. I also discussed with the patient that there may be a patient responsible charge related to this service. The patient expressed understanding and verbally consented to this telephonic visit.    Interactive audio and video telecommunications were attempted between this provider and patient, however failed, due to patient having technical difficulties OR patient did not have access to video capability.  We continued and completed visit with audio only.     Vital signs may be patient reported or missing.  Subjective:   Caitlyn May is a 65 y.o. female who presents for Medicare Annual (Subsequent) preventive examination.  Review of Systems     Cardiac Risk Factors include: advanced age (>66men, >64 women);hypertension;dyslipidemia;sedentary lifestyle;smoking/ tobacco exposure     Objective:    Today's Vitals   06/12/20 1426 06/12/20 1427  Weight: 115 lb (52.2 kg)   Height: 5\' 6"  (1.676 m)   PainSc:  5    Body mass index is 18.56 kg/m.  Advanced Directives 06/12/2020 06/10/2019 06/25/2018 05/03/2018 04/14/2017 11/29/2016  Does Patient Have a Medical Advance Directive? No No No No No No  Would patient like information on creating a medical advance directive? - Yes (MAU/Ambulatory/Procedural Areas - Information given) - Yes (MAU/Ambulatory/Procedural Areas - Information given) Yes (MAU/Ambulatory/Procedural Areas - Information given) -    Current Medications (verified) Outpatient Encounter Medications as of 06/12/2020  Medication Sig  . amLODipine  (NORVASC) 10 MG tablet Take 1 tablet (10 mg total) by mouth daily.  Marland Kitchen atorvastatin (LIPITOR) 80 MG tablet Take 1 tablet (80 mg total) by mouth daily.  . benazepril (LOTENSIN) 40 MG tablet TAKE ONE TABLET BY MOUTH ONCE DAILY  . Budeson-Glycopyrrol-Formoterol (BREZTRI AEROSPHERE) 160-9-4.8 MCG/ACT AERO Inhale 1 puff into the lungs every 6 (six) hours as needed.  . carvedilol (COREG) 12.5 MG tablet Take 1 tablet (12.5 mg total) by mouth 2 (two) times daily.  . cetirizine (ZYRTEC) 10 MG tablet Take 1 tablet (10 mg total) by mouth daily.  . clonazePAM (KLONOPIN) 0.5 MG tablet Take 1 tablet (0.5 mg total) by mouth 2 (two) times daily as needed for anxiety.  . cyclobenzaprine (FLEXERIL) 5 MG tablet TAKE ONE TABLET BY MOUTH ONCE DAILY AS NEEDED FOR MUSCLE SPASMS  . ipratropium-albuterol (DUONEB) 0.5-2.5 (3) MG/3ML SOLN Take 3 mLs by nebulization every 4 (four) hours as needed.  . megestrol (MEGACE) 40 MG tablet TAKE ONE TABLET BY MOUTH FOUR TIMES DAILY AS NEEDED  . meloxicam (MOBIC) 7.5 MG tablet TAKE ONE TABLET BY MOUTH ONCE DAILY  . montelukast (SINGULAIR) 10 MG tablet Take 1 tablet (10 mg total) by mouth at bedtime.  . ondansetron (ZOFRAN ODT) 8 MG disintegrating tablet Take 1 tablet (8 mg total) by mouth every 8 (eight) hours as needed for nausea or vomiting.  . pantoprazole (PROTONIX) 40 MG tablet Take 1 tablet by mouth at bedtime.  . sertraline (ZOLOFT) 100 MG tablet Take 1.5 tablets (150 mg total) by mouth daily.  . VENTOLIN HFA 108 (90 Base) MCG/ACT inhaler INHALE 2 PUFFS BY MOUTH INTO THE LUNGS EVERY 6 HOURS AS NEEDED FOR WHEEZING OR SHORTNESS OF BREATH   No facility-administered encounter  medications on file as of 06/12/2020.    Allergies (verified) Accupril [quinapril hcl], Avelox [moxifloxacin], Ciprofloxacin, Hctz [hydrochlorothiazide], Penicillins, and Sulfa antibiotics   History: Past Medical History:  Diagnosis Date  . Acute rhinosinusitis 07/23/2019  . Chronic kidney disease   .  Depression   . Failed back syndrome, lumbar   . Hyperlipidemia   . Hypertension   . Osteoporosis   . Weight loss    Past Surgical History:  Procedure Laterality Date  . ABDOMINAL HYSTERECTOMY    . CESAREAN SECTION    . MOUTH SURGERY    . SPINE SURGERY     Family History  Problem Relation Age of Onset  . Hypertension Mother   . Dementia Mother   . Cancer Father   . Cancer Maternal Aunt        breast  . Diabetes Neg Hx   . Heart disease Neg Hx   . Stroke Neg Hx   . COPD Neg Hx    Social History   Socioeconomic History  . Marital status: Married    Spouse name: Not on file  . Number of children: Not on file  . Years of education: 84  . Highest education level: 10th grade  Occupational History  . Occupation: disability  Tobacco Use  . Smoking status: Current Every Day Smoker    Packs/day: 1.00    Types: Cigarettes  . Smokeless tobacco: Never Used  Vaping Use  . Vaping Use: Never used  Substance and Sexual Activity  . Alcohol use: No  . Drug use: Not Currently    Types: Marijuana    Comment: last week estimated 05/10/2019  . Sexual activity: Not on file  Other Topics Concern  . Not on file  Social History Narrative  . Not on file   Social Determinants of Health   Financial Resource Strain: Low Risk   . Difficulty of Paying Living Expenses: Not very hard  Food Insecurity: No Food Insecurity  . Worried About Charity fundraiser in the Last Year: Never true  . Ran Out of Food in the Last Year: Never true  Transportation Needs: No Transportation Needs  . Lack of Transportation (Medical): No  . Lack of Transportation (Non-Medical): No  Physical Activity: Inactive  . Days of Exercise per Week: 0 days  . Minutes of Exercise per Session: 0 min  Stress: No Stress Concern Present  . Feeling of Stress : Not at all  Social Connections: Not on file    Tobacco Counseling Ready to quit: No Counseling given: Not Answered   Clinical Intake:  Pre-visit  preparation completed: Yes  Pain : 0-10 Pain Score: 5  Pain Type: Chronic pain Pain Location: Back Pain Orientation: Lower Pain Descriptors / Indicators: Aching Pain Onset: More than a month ago Pain Frequency: Constant     Nutritional Status: BMI of 19-24  Normal Nutritional Risks: None Diabetes: No  What is the last grade level you completed in school?: 10th grade  Diabetic?no  Interpreter Needed?: No  Information entered by :: NAllen LPN   Activities of Daily Living In your present state of health, do you have any difficulty performing the following activities: 06/12/2020  Hearing? N  Vision? N  Difficulty concentrating or making decisions? N  Walking or climbing stairs? N  Dressing or bathing? N  Doing errands, shopping? N  Preparing Food and eating ? N  Using the Toilet? N  In the past six months, have you accidently leaked urine? Darreld Mclean  Do you have problems with loss of bowel control? N  Managing your Medications? N  Managing your Finances? N  Housekeeping or managing your Housekeeping? N  Some recent data might be hidden    Patient Care Team: Volney American, PA-C as PCP - General (Family Medicine) Kate Sable, MD as PCP - Cardiology (Cardiology) Vladimir Faster, [facility] (Pharmacist)  Indicate any recent Medical Services you may have received from other than [facility] providers in the past year (date may be approximate).     Assessment:   This is a routine wellness examination for Grandin.  Hearing/Vision screen  Hearing Screening   125Hz  250Hz  500Hz  1000Hz  2000Hz  3000Hz  4000Hz  6000Hz  8000Hz   Right ear:           Left ear:           Vision Screening Comments: Regular eye exams, [facility]  Dietary issues and exercise activities discussed: Current Exercise Habits: The patient does not participate in regular exercise at present  Goals    . DIET - INCREASE WATER INTAKE     Recommend drinking at least 5-6 glasses of water a day     . Patient Stated      06/12/2020, no goals    . Quit Smoking     Smoking cessation discussed      Depression Screen PHQ 2/9 Scores 06/12/2020 05/19/2020 04/30/2020 04/20/2020 03/23/2020 09/16/2019 06/10/2019  PHQ - 2 Score 2 4 - 2 6 4 3   PHQ- 9 Score 5 8 - 9 12 16 6   Exception Documentation - - Patient refusal - - - -    Fall Risk Fall Risk  06/12/2020 06/10/2019 05/03/2018 03/14/2018 07/18/2017  Falls in the past year? 0 0 1 No Yes  Number falls in past yr: - 0 1 - 1  Injury with Fall? - 0 0 - Yes  Risk Factor Category  - - - - High Fall Risk  Risk for fall due to : Medication side effect - - - -  Follow up Falls evaluation completed;Education provided;Falls prevention discussed - Education provided - -    FALL RISK PREVENTION PERTAINING TO THE HOME:  Any stairs in or around the home? Yes  If so, are there any without handrails? No  Home free of loose throw rugs in walkways, pet beds, electrical cords, etc? Yes  Adequate lighting in your home to reduce risk of falls? Yes   ASSISTIVE DEVICES UTILIZED TO PREVENT FALLS:  Life alert? No  Use of a cane, walker or w/c? No  Grab bars in the bathroom? No  Shower chair or bench in shower? Yes  Elevated toilet seat or a handicapped toilet? No   TIMED UP AND GO:  Was the test performed? No .    Cognitive Function:     6CIT Screen 06/12/2020 06/10/2019 05/03/2018 04/14/2017  What Year? 0 points 0 points 0 points 0 points  What month? 0 points 0 points 0 points 0 points  What time? 0 points 0 points 0 points 0 points  Count back from 20 0 points 0 points 0 points 0 points  Months in reverse 2 points 0 points 2 points 0 points  Repeat phrase 2 points 0 points 2 points 0 points  Total Score 4 0 4 0    Immunizations Immunization History  Administered Date(s) Administered  . Fluad Quad(high Dose 65+) 03/23/2020  . Influenza, High Dose Seasonal PF 04/14/2017  . Influenza,inj,Quad PF,6+ Mos 03/31/2015, 02/10/2016, 03/30/2018, 06/10/2019  .  Influenza-Unspecified  03/11/2014  . Pneumococcal Conjugate-13 03/23/2020  . Pneumococcal-Unspecified 11/01/2007  . Td 11/01/2007    TDAP status: Due, Education has been provided regarding the importance of this vaccine. Advised may receive this vaccine at local pharmacy or Health Dept. Aware to provide a copy of the vaccination record if obtained from local pharmacy or Health Dept. Verbalized acceptance and understanding.  Flu Vaccine status: Up to date  Pneumococcal vaccine status: Up to date  Covid-19 vaccine status: Declined, Education has been provided regarding the importance of this vaccine but patient still declined. Advised may receive this vaccine at local pharmacy or Health Dept.or vaccine clinic. Aware to provide a copy of the vaccination record if obtained from local pharmacy or Health Dept. Verbalized acceptance and understanding.  Qualifies for Shingles Vaccine? Yes   Zostavax completed No   Shingrix Completed?: No.    Education has been provided regarding the importance of this vaccine. Patient has been advised to call insurance company to determine out of pocket expense if they have not yet received this vaccine. Advised may also receive vaccine at local pharmacy or Health Dept. Verbalized acceptance and understanding.  Screening Tests Health Maintenance  Topic Date Due  . COVID-19 Vaccine (1) Never done  . COLONOSCOPY (Pts 45-53yrs Insurance coverage will need to be confirmed)  Never done  . TETANUS/TDAP  10/31/2017  . DEXA SCAN  Never done  . MAMMOGRAM  09/15/2020 (Originally 09/20/2004)  . PNA vac Low Risk Adult (2 of 2 - PPSV23) 03/23/2021  . INFLUENZA VACCINE  Completed  . HIV Screening  Completed  . Hepatitis C Screening  Addressed    Health Maintenance  Health Maintenance Due  Topic Date Due  . COVID-19 Vaccine (1) Never done  . COLONOSCOPY (Pts 45-96yrs Insurance coverage will need to be confirmed)  Never done  . TETANUS/TDAP  10/31/2017  . DEXA SCAN   Never done    Colorectal cancer screening: decline at this time  Mammogram status: Ordered 05/19/2020. Pt provided with contact info and advised to call to schedule appt.   Bone Density status: Ordered 05/19/2020. Pt provided with contact info and advised to call to schedule appt.  Lung Cancer Screening: (Low Dose CT Chest recommended if Age 73-80 years, 30 pack-year currently smoking OR have quit w/in 15years.) does not qualify.   Lung Cancer Screening Referral: no  Additional Screening:  Hepatitis C Screening: does qualify; Completed 06/07/1995  Vision Screening: Recommended annual ophthalmology exams for early detection of glaucoma and other disorders of the eye. Is the patient up to date with their annual eye exam?  Yes  Who is the provider or what is the name of the office in which the patient attends annual eye exams? [facility] If pt is not established with a provider, would they like to be referred to a provider to establish care? No .   Dental Screening: Recommended annual dental exams for proper oral hygiene  Community Resource Referral / Chronic Care Management: CRR required this visit?  No   CCM required this visit?  No      Plan:     I have personally reviewed and noted the following in the patient's chart:   . Medical and social history . Use of alcohol, tobacco or illicit drugs  . Current medications and supplements . Functional ability and status . Nutritional status . Physical activity . Advanced directives . List of other physicians . Hospitalizations, surgeries, and ER visits in previous 12 months . Vitals . Screenings to  include cognitive, depression, and falls . Referrals and appointments  In addition, I have reviewed and discussed with patient certain preventive protocols, quality metrics, and best practice recommendations. A written personalized care plan for preventive services as well as general preventive health recommendations were provided to  patient.     Kellie Simmering, LPN   [id number]   Nurse Notes:

## 2020-06-12 NOTE — Telephone Encounter (Signed)
Patient consented to telehealth visit. 

## 2020-06-12 NOTE — Patient Instructions (Signed)
Caitlyn May , Thank you for taking time to come for your Medicare Wellness Visit. I appreciate your ongoing commitment to your health goals. Please review the following plan we discussed and let me know if I can assist you in the future.   Screening recommendations/referrals: Colonoscopy: decline at this time Mammogram: to be scheduled Bone Density: to be scheduled Recommended yearly ophthalmology/optometry visit for glaucoma screening and checkup Recommended yearly dental visit for hygiene and checkup  Vaccinations: Influenza vaccine: completed 03/23/2020, due 12/28/2020 Pneumococcal vaccine: completed 03/23/2020 Tdap vaccine: due Shingles vaccine: discussed   Covid-19: decline  Advanced directives: Advance directive discussed with you today.   Conditions/risks identified: smoking  Next appointment: Follow up in one year for your annual wellness visit    Preventive Care 5 Years and Older, Female Preventive care refers to lifestyle choices and visits with your health care provider that can promote health and wellness. What does preventive care include?  A yearly physical exam. This is also called an annual well check.  Dental exams once or twice a year.  Routine eye exams. Ask your health care provider how often you should have your eyes checked.  Personal lifestyle choices, including:  Daily care of your teeth and gums.  Regular physical activity.  Eating a healthy diet.  Avoiding tobacco and drug use.  Limiting alcohol use.  Practicing safe sex.  Taking low-dose aspirin every day.  Taking vitamin and mineral supplements as recommended by your health care provider. What happens during an annual well check? The services and screenings done by your health care provider during your annual well check will depend on your age, overall health, lifestyle risk factors, and family history of disease. Counseling  Your health care provider may ask you questions about  your:  Alcohol use.  Tobacco use.  Drug use.  Emotional well-being.  Home and relationship well-being.  Sexual activity.  Eating habits.  History of falls.  Memory and ability to understand (cognition).  Work and work Statistician.  Reproductive health. Screening  You may have the following tests or measurements:  Height, weight, and BMI.  Blood pressure.  Lipid and cholesterol levels. These may be checked every 5 years, or more frequently if you are over 26 years old.  Skin check.  Lung cancer screening. You may have this screening every year starting at age 33 if you have a 30-pack-year history of smoking and currently smoke or have quit within the past 15 years.  Fecal occult blood test (FOBT) of the stool. You may have this test every year starting at age 29.  Flexible sigmoidoscopy or colonoscopy. You may have a sigmoidoscopy every 5 years or a colonoscopy every 10 years starting at age 70.  Hepatitis C blood test.  Hepatitis B blood test.  Sexually transmitted disease (STD) testing.  Diabetes screening. This is done by checking your blood sugar (glucose) after you have not eaten for a while (fasting). You may have this done every 1-3 years.  Bone density scan. This is done to screen for osteoporosis. You may have this done starting at age 64.  Mammogram. This may be done every 1-2 years. Talk to your health care provider about how often you should have regular mammograms. Talk with your health care provider about your test results, treatment options, and if necessary, the need for more tests. Vaccines  Your health care provider may recommend certain vaccines, such as:  Influenza vaccine. This is recommended every year.  Tetanus, diphtheria, and acellular pertussis (  Tdap, Td) vaccine. You may need a Td booster every 10 years.  Zoster vaccine. You may need this after age 28.  Pneumococcal 13-valent conjugate (PCV13) vaccine. One dose is recommended  after age 70.  Pneumococcal polysaccharide (PPSV23) vaccine. One dose is recommended after age 53. Talk to your health care provider about which screenings and vaccines you need and how often you need them. This information is not intended to replace advice given to you by your health care provider. Make sure you discuss any questions you have with your health care provider. Document Released: 06/12/2015 Document Revised: 02/03/2016 Document Reviewed: 03/17/2015 Elsevier Interactive Patient Education  2017 [place] Prevention in the Home Falls can cause injuries. They can happen to people of all ages. There are many things you can do to make your home safe and to help prevent falls. What can I do on the outside of my home?  Regularly fix the edges of walkways and driveways and fix any cracks.  Remove anything that might make you trip as you walk through a door, such as a raised step or threshold.  Trim any bushes or trees on the path to your home.  Use bright outdoor lighting.  Clear any walking paths of anything that might make someone trip, such as rocks or tools.  Regularly check to see if handrails are loose or broken. Make sure that both sides of any steps have handrails.  Any raised decks and porches should have guardrails on the edges.  Have any leaves, snow, or ice cleared regularly.  Use sand or salt on walking paths during winter.  Clean up any spills in your garage right away. This includes oil or grease spills. What can I do in the bathroom?  Use night lights.  Install grab bars by the toilet and in the tub and shower. Do not use towel bars as grab bars.  Use non-skid mats or decals in the tub or shower.  If you need to sit down in the shower, use a plastic, non-slip stool.  Keep the floor dry. Clean up any water that spills on the floor as soon as it happens.  Remove soap buildup in the tub or shower regularly.  Attach bath mats securely with  double-sided non-slip rug tape.  Do not have throw rugs and other things on the floor that can make you trip. What can I do in the bedroom?  Use night lights.  Make sure that you have a light by your bed that is easy to reach.  Do not use any sheets or blankets that are too big for your bed. They should not hang down onto the floor.  Have a firm chair that has side arms. You can use this for support while you get dressed.  Do not have throw rugs and other things on the floor that can make you trip. What can I do in the kitchen?  Clean up any spills right away.  Avoid walking on wet floors.  Keep items that you use a lot in easy-to-reach places.  If you need to reach something above you, use a strong step stool that has a grab bar.  Keep electrical cords out of the way.  Do not use floor polish or wax that makes floors slippery. If you must use wax, use non-skid floor wax.  Do not have throw rugs and other things on the floor that can make you trip. What can I do with my stairs?  Do  not leave any items on the stairs.  Make sure that there are handrails on both sides of the stairs and use them. Fix handrails that are broken or loose. Make sure that handrails are as long as the stairways.  Check any carpeting to make sure that it is firmly attached to the stairs. Fix any carpet that is loose or worn.  Avoid having throw rugs at the top or bottom of the stairs. If you do have throw rugs, attach them to the floor with carpet tape.  Make sure that you have a light switch at the top of the stairs and the bottom of the stairs. If you do not have them, ask someone to add them for you. What else can I do to help prevent falls?  Wear shoes that:  Do not have high heels.  Have rubber bottoms.  Are comfortable and fit you well.  Are closed at the toe. Do not wear sandals.  If you use a stepladder:  Make sure that it is fully opened. Do not climb a closed stepladder.  Make  sure that both sides of the stepladder are locked into place.  Ask someone to hold it for you, if possible.  Clearly mark and make sure that you can see:  Any grab bars or handrails.  First and last steps.  Where the edge of each step is.  Use tools that help you move around (mobility aids) if they are needed. These include:  Canes.  Walkers.  Scooters.  Crutches.  Turn on the lights when you go into a dark area. Replace any light bulbs as soon as they burn out.  Set up your furniture so you have a clear path. Avoid moving your furniture around.  If any of your floors are uneven, fix them.  If there are any pets around you, be aware of where they are.  Review your medicines with your doctor. Some medicines can make you feel dizzy. This can increase your chance of falling. Ask your doctor what other things that you can do to help prevent falls. This information is not intended to replace advice given to you by your health care provider. Make sure you discuss any questions you have with your health care provider. Document Released: 03/12/2009 Document Revised: 10/22/2015 Document Reviewed: 06/20/2014 Elsevier Interactive Patient Education  2017 [employer].

## 2020-06-17 ENCOUNTER — Telehealth: Payer: Self-pay | Admitting: Cardiology

## 2020-06-17 NOTE — Telephone Encounter (Signed)
Returned patients call and clarified med changes made at [facility] on 06/09/20. Patient was grateful for the follow up.Marland Kitchen

## 2020-06-17 NOTE — Telephone Encounter (Signed)
Patient calling in to go over blood pressure medications and which she was supposed to stop  Please advise

## 2020-06-18 ENCOUNTER — Other Ambulatory Visit: Payer: Self-pay | Admitting: Nurse Practitioner

## 2020-06-29 ENCOUNTER — Telehealth: Payer: Self-pay

## 2020-06-29 NOTE — Telephone Encounter (Signed)
Metoprolol was discontinued by Cardiology and changed to Coreg 12.5mg  BID at their last office visit 1/11.

## 2020-06-29 NOTE — Telephone Encounter (Signed)
Received refill request for Metoprolol 50 mg tablets. RX discontinued 06/09/20 by Dr. Ky Barban but not clear as to why. Is the patient supposed to still be on this medication?

## 2020-06-29 NOTE — Telephone Encounter (Signed)
Called and left a detailed message for patient letting her know Dr.Rumball's response

## 2020-07-08 ENCOUNTER — Telehealth: Payer: Self-pay | Admitting: Cardiology

## 2020-07-08 NOTE — Telephone Encounter (Signed)
  Patient Consent for Virtual Visit         Caitlyn May has provided verbal consent on 07/08/2020 for a virtual visit (video or telephone).   CONSENT FOR VIRTUAL VISIT FOR:  Caitlyn May  By participating in this virtual visit I agree to the following:  I hereby voluntarily request, consent and authorize [facility] and its employed or contracted physicians, physician assistants, nurse practitioners or other licensed health care professionals (the Practitioner), to provide me with telemedicine health care services (the "Services") as deemed necessary by the treating Practitioner. I acknowledge and consent to receive the Services by the Practitioner via telemedicine. I understand that the telemedicine visit will involve communicating with the Practitioner through live audiovisual communication technology and the disclosure of certain medical information by electronic transmission. I acknowledge that I have been given the opportunity to request an in-person assessment or other available alternative prior to the telemedicine visit and am voluntarily participating in the telemedicine visit.  I understand that I have the right to withhold or withdraw my consent to the use of telemedicine in the course of my care at any time, without affecting my right to future care or treatment, and that the Practitioner or I may terminate the telemedicine visit at any time. I understand that I have the right to inspect all information obtained and/or recorded in the course of the telemedicine visit and may receive copies of available information for a reasonable fee.  I understand that some of the potential risks of receiving the Services via telemedicine include:  Marland Kitchen Delay or interruption in medical evaluation due to technological equipment failure or disruption; . Information transmitted may not be sufficient (e.g. poor resolution of images) to allow for appropriate medical decision making by the  Practitioner; and/or  . In rare instances, security protocols could fail, causing a breach of personal health information.  Furthermore, I acknowledge that it is my responsibility to provide information about my medical history, conditions and care that is complete and accurate to the best of my ability. I acknowledge that Practitioner's advice, recommendations, and/or decision may be based on factors not within their control, such as incomplete or inaccurate data provided by me or distortions of diagnostic images or specimens that may result from electronic transmissions. I understand that the practice of medicine is not an exact science and that Practitioner makes no warranties or guarantees regarding treatment outcomes. I acknowledge that a copy of this consent can be made available to me via my patient portal ([facility]), or I can request a printed copy by calling the office of [facility].    I understand that my insurance will be billed for this visit.   I have read or had this consent read to me. . I understand the contents of this consent, which adequately explains the benefits and risks of the Services being provided via telemedicine.  . I have been provided ample opportunity to ask questions regarding this consent and the Services and have had my questions answered to my satisfaction. . I give my informed consent for the services to be provided through the use of telemedicine in my medical care

## 2020-07-09 ENCOUNTER — Encounter: Payer: Self-pay | Admitting: Cardiology

## 2020-07-09 ENCOUNTER — Other Ambulatory Visit: Payer: Self-pay

## 2020-07-09 ENCOUNTER — Telehealth (INDEPENDENT_AMBULATORY_CARE_PROVIDER_SITE_OTHER): Payer: Medicare HMO | Admitting: Cardiology

## 2020-07-09 DIAGNOSIS — F17200 Nicotine dependence, unspecified, uncomplicated: Secondary | ICD-10-CM | POA: Diagnosis not present

## 2020-07-09 DIAGNOSIS — E7800 Pure hypercholesterolemia, unspecified: Secondary | ICD-10-CM

## 2020-07-09 DIAGNOSIS — I10 Essential (primary) hypertension: Secondary | ICD-10-CM | POA: Diagnosis not present

## 2020-07-09 NOTE — Progress Notes (Signed)
Virtual Visit via Video Note   This visit type was conducted due to national recommendations for restrictions regarding the COVID-19 Pandemic (e.g. social distancing) in an effort to limit this patient's exposure and mitigate transmission in our community.  Due to her co-morbid illnesses, this patient is at least at moderate risk for complications without adequate follow up.  This format is felt to be most appropriate for this patient at this time.  All issues noted in this document were discussed and addressed.  A limited physical exam was performed with this format.  Please refer to the patient's chart for her consent to telehealth for [facility].      Date:  07/09/2020   ID:  Caitlyn May, DOB 1956/02/26, MRN [id number]  Patient Location: Home Provider Location: Office/Clinic  PCP:  Jon Billings, NP  Cardiologist:  Kate Sable, MD  Electrophysiologist:  None   Evaluation Performed:  Follow-Up Visit  Chief Complaint:  Blood pressure elevated  History of Present Illness:    Caitlyn May is a 65 y.o. female with a hx of hypertension, hyperlipidemia, depression, chronic back pain, current smoker x50+ years, chronic back pain who presents for follow-up.  Last seen due to difficult to control blood pressures.    Medications were adjusted after last visit, Toprol-XL was stopped, Coreg 12.5 mg twice daily was started.  Benazepril 40 and amlodipine 10 mg were continued.  Patient states blood pressures have been running around systolic 956O.  She also complains of chronic back pain, rates pain as 7 out of 10 on average daily sometimes even go higher.  She is planning on making an appointment to see pain specialist for adequate management.  She states coming in contact with someone with Covid, currently has no Covid symptoms.   Prior notes Echocardiogram 12/2016 showed normal systolic function, EF 60 to 13%, normal diastolic function. Patient is allergic to HCTZ.  The  patient does not have symptoms concerning for COVID-19 infection (fever, chills, cough, or new shortness of breath).    Past Medical History:  Diagnosis Date  . Acute rhinosinusitis 07/23/2019  . Chronic kidney disease   . Depression   . Failed back syndrome, lumbar   . Hyperlipidemia   . Hypertension   . Osteoporosis   . Weight loss    Past Surgical History:  Procedure Laterality Date  . ABDOMINAL HYSTERECTOMY    . CESAREAN SECTION    . MOUTH SURGERY    . SPINE SURGERY       Current Meds  Medication Sig  . amLODipine (NORVASC) 10 MG tablet TAKE ONE TABLET BY MOUTH ONCE DAILY  . atorvastatin (LIPITOR) 80 MG tablet Take 1 tablet (80 mg total) by mouth daily.  . benazepril (LOTENSIN) 40 MG tablet TAKE ONE TABLET BY MOUTH ONCE DAILY  . Budeson-Glycopyrrol-Formoterol (BREZTRI AEROSPHERE) 160-9-4.8 MCG/ACT AERO Inhale 1 puff into the lungs every 6 (six) hours as needed.  . carvedilol (COREG) 12.5 MG tablet Take 1 tablet (12.5 mg total) by mouth 2 (two) times daily.  . cetirizine (ZYRTEC) 10 MG tablet Take 1 tablet (10 mg total) by mouth daily.  . cyclobenzaprine (FLEXERIL) 5 MG tablet TAKE ONE TABLET BY MOUTH ONCE DAILY AS NEEDED FOR MUSCLE SPASMS  . ipratropium-albuterol (DUONEB) 0.5-2.5 (3) MG/3ML SOLN Take 3 mLs by nebulization every 4 (four) hours as needed.  . megestrol (MEGACE) 40 MG tablet TAKE ONE TABLET BY MOUTH FOUR TIMES DAILY AS NEEDED  . meloxicam (MOBIC) 7.5 MG tablet TAKE ONE TABLET BY  MOUTH ONCE DAILY  . montelukast (SINGULAIR) 10 MG tablet Take 1 tablet (10 mg total) by mouth at bedtime.  . ondansetron (ZOFRAN ODT) 8 MG disintegrating tablet Take 1 tablet (8 mg total) by mouth every 8 (eight) hours as needed for nausea or vomiting.  . pantoprazole (PROTONIX) 40 MG tablet Take 1 tablet by mouth at bedtime.  . sertraline (ZOLOFT) 100 MG tablet Take 1.5 tablets (150 mg total) by mouth daily.  . VENTOLIN HFA 108 (90 Base) MCG/ACT inhaler INHALE 2 PUFFS BY MOUTH INTO THE  LUNGS EVERY 6 HOURS AS NEEDED FOR WHEEZING OR SHORTNESS OF BREATH     Allergies:   Accupril [quinapril hcl], Avelox [moxifloxacin], Ciprofloxacin, Hctz [hydrochlorothiazide], Penicillins, and Sulfa antibiotics   Social History   Tobacco Use  . Smoking status: Current Every Day Smoker    Packs/day: 1.00    Types: Cigarettes  . Smokeless tobacco: Never Used  Vaping Use  . Vaping Use: Never used  Substance Use Topics  . Alcohol use: No  . Drug use: Not Currently    Types: Marijuana    Comment: last week estimated 05/10/2019     Family Hx: The patient's family history includes Cancer in her father and maternal aunt; Dementia in her mother; Hypertension in her mother. There is no history of Diabetes, Heart disease, Stroke, or COPD.  ROS:   Please see the history of present illness.     All other systems reviewed and are negative.   Prior CV studies:   The following studies were reviewed today:    Labs/Other Tests and Data Reviewed:    EKG:  No ECG reviewed.  Recent Labs: 03/23/2020: ALT 15; Hemoglobin 15.7; Platelets 298; TSH 3.270 04/30/2020: BUN 16; Creatinine, Ser 1.15; Potassium 4.3; Sodium 141   Recent Lipid Panel Lab Results  Component Value Date/Time   CHOL 184 03/23/2020 02:44 PM   CHOL 163 12/11/2017 09:34 AM   TRIG 122 03/23/2020 02:44 PM   TRIG 167 (H) 12/11/2017 09:34 AM   HDL 56 03/23/2020 02:44 PM   CHOLHDL 2.4 04/14/2017 04:08 PM   LDLCALC 106 (H) 03/23/2020 02:44 PM    Wt Readings from Last 3 Encounters:  06/12/20 115 lb (52.2 kg)  06/09/20 114 lb (51.7 kg)  05/19/20 113 lb 8 oz (51.5 kg)     Objective:    Vital Signs:  There were no vitals taken for this visit.   VITAL SIGNS:  reviewed GEN:  no acute distress EYES:  sclerae anicteric, EOMI - Extraocular Movements Intact  ASSESSMENT & PLAN:    1. Hypertension, BP improved but still elevated around 353I to 144R systolic.  Chronic pain with pain scale of 7 out of 10 contributing to  elevated blood pressures.  We will continue medications as currently prescribed, patient advised to seek help regarding pain management.  If her pain is adequately controlled and blood pressure stays elevated, will consider increasing dose of Coreg.  Continue lisinopril and amlodipine as prescribed. 2. Hyperlipidemia, continue statin. 3. Current smoker, cessation advised.  Follow-up in 3 months  COVID-19 Education: The signs and symptoms of COVID-19 were discussed with the patient and how to seek care for testing (follow up with PCP or arrange E-visit).  The importance of social distancing was discussed today.  Time:   Today, I have spent 35 minutes with the patient with telehealth technology discussing the above problems.     Medication Adjustments/Labs and Tests Ordered: Current medicines are reviewed at length with the patient  today.  Concerns regarding medicines are outlined above.   Tests Ordered: No orders of the defined types were placed in this encounter.   Medication Changes: No orders of the defined types were placed in this encounter.   Follow Up:  In Person in 3 month(s)  Signed, Kate Sable, MD  07/09/2020 12:57 PM    [place]

## 2020-07-09 NOTE — Patient Instructions (Signed)
Medication Instructions:  Your physician recommends that you continue on your current medications as directed. Please refer to the Current Medication list given to you today.  *If you need a refill on your cardiac medications before your next appointment, please call your pharmacy*   Lab Work: None Ordered If you have labs (blood work) drawn today and your tests are completely normal, you will receive your results only by: Marland Kitchen MyChart Message (if you have MyChart) OR . A paper copy in the mail If you have any lab test that is abnormal or we need to change your treatment, we will call you to review the results.   Testing/Procedures: None ordered   Follow-Up: At [facility], you and your health needs are our priority.  As part of our continuing mission to provide you with exceptional heart care, we have created designated Provider Care Teams.  These Care Teams include your primary Cardiologist (physician) and Advanced Practice Providers (APPs -  Physician Assistants and Nurse Practitioners) who all work together to provide you with the care you need, when you need it.  We recommend signing up for the patient portal called "MyChart".  Sign up information is provided on this After Visit Summary.  MyChart is used to connect with patients for Virtual Visits (Telemedicine).  Patients are able to view lab/test results, encounter notes, upcoming appointments, etc.  Non-urgent messages can be sent to your provider as well.   To learn more about what you can do with MyChart, go to [email or web address].    Your next appointment:   3 month(s)  The format for your next appointment:   In Person  Provider:   Kate Sable, MD   Other Instructions

## 2020-07-13 ENCOUNTER — Other Ambulatory Visit: Payer: Self-pay | Admitting: Family Medicine

## 2020-07-13 DIAGNOSIS — M961 Postlaminectomy syndrome, not elsewhere classified: Secondary | ICD-10-CM

## 2020-07-13 NOTE — Telephone Encounter (Signed)
Requested medication (s) are due for refill today: no  Requested medication (s) are on the active medication list: yes  Last refill:  07/13/2020  Future visit scheduled: yes  Notes to clinic:  this refill cannot be delegated    Requested Prescriptions  Pending Prescriptions Disp Refills   cyclobenzaprine (FLEXERIL) 5 MG tablet [Pharmacy Med Name: cyclobenzaprine 5 mg tablet] 60 tablet 2    Sig: TAKE ONE TABLET BY MOUTH ONCE DAILY AS NEEDED FOR MUSCLE SPASMS      Not Delegated - Analgesics:  Muscle Relaxants Failed - 07/13/2020  3:24 PM      Failed - This refill cannot be delegated      Passed - Valid encounter within last 6 months    Recent Outpatient Visits           1 month ago Encounter for screening for malignant neoplasm of breast, unspecified screening modality   [facility] Myles Gip, DO   2 months ago Depression, recurrent [facility])   [facility] Myles Gip, DO   2 months ago Depression, recurrent [facility])   [place], Jessica A, NP   3 months ago Depression, recurrent [facility])   [facility] Eulogio Bear, NP   9 months ago Non-intractable vomiting with nausea, unspecified vomiting type   [facility], Lilia Argue, [place]       Future Appointments             In 3 months Agbor-Etang, Aaron Edelman, MD [facility]   In 11 months  [employer], [place]

## 2020-08-12 ENCOUNTER — Other Ambulatory Visit: Payer: Self-pay | Admitting: Family Medicine

## 2020-08-12 ENCOUNTER — Other Ambulatory Visit: Payer: Self-pay | Admitting: Nurse Practitioner

## 2020-08-18 ENCOUNTER — Other Ambulatory Visit: Payer: Self-pay | Admitting: Family Medicine

## 2020-08-18 DIAGNOSIS — M961 Postlaminectomy syndrome, not elsewhere classified: Secondary | ICD-10-CM

## 2020-08-18 NOTE — Telephone Encounter (Signed)
Patient is due for follow up.

## 2020-08-18 NOTE — Telephone Encounter (Signed)
Routing to provider  

## 2020-08-18 NOTE — Telephone Encounter (Signed)
Requested medication (s) are due for refill today: no  Requested medication (s) are on the active medication list: yes  Last refill:  07/13/2020  Future visit scheduled:no  Notes to clinic:  this refill cannot be delegated    Requested Prescriptions  Pending Prescriptions Disp Refills   cyclobenzaprine (FLEXERIL) 5 MG tablet [Pharmacy Med Name: cyclobenzaprine 5 mg tablet] 60 tablet 2    Sig: TAKE ONE TABLET BY MOUTH ONCE DAILY AS NEEDED FOR MUSCLE SPASMS      Not Delegated - Analgesics:  Muscle Relaxants Failed - 08/18/2020  9:33 AM      Failed - This refill cannot be delegated      Passed - Valid encounter within last 6 months    Recent Outpatient Visits           3 months ago Encounter for screening for malignant neoplasm of breast, unspecified screening modality   [facility] Myles Gip, DO   3 months ago Depression, recurrent [facility])   [place], Roberts, DO   4 months ago Depression, recurrent [facility])   [place], Jessica A, NP   4 months ago Depression, recurrent [facility])   [facility] Eulogio Bear, NP   10 months ago Non-intractable vomiting with nausea, unspecified vomiting type   [facility], Lilia Argue, [place]       Future Appointments             In 1 month Agbor-Etang, Aaron Edelman, MD [facility], [facility]   In 10 months  [employer], [place]

## 2020-08-19 DIAGNOSIS — I700 Atherosclerosis of aorta: Secondary | ICD-10-CM | POA: Insufficient documentation

## 2020-09-16 ENCOUNTER — Other Ambulatory Visit: Payer: Self-pay

## 2020-09-16 ENCOUNTER — Other Ambulatory Visit: Payer: Self-pay | Admitting: Family Medicine

## 2020-09-16 DIAGNOSIS — M961 Postlaminectomy syndrome, not elsewhere classified: Secondary | ICD-10-CM

## 2020-09-16 MED ORDER — AMLODIPINE BESYLATE 10 MG PO TABS: 1.0000 | ORAL_TABLET | Freq: Every day | ORAL | 0 refills | Status: DC

## 2020-09-16 MED ORDER — CYCLOBENZAPRINE HCL 5 MG PO TABS: ORAL_TABLET | ORAL | 0 refills | Status: DC

## 2020-09-16 NOTE — Telephone Encounter (Signed)
Patient is due for follow up.  1 month supply sent.

## 2020-10-12 ENCOUNTER — Ambulatory Visit: Payer: Medicare HMO | Admitting: Cardiology

## 2020-10-21 ENCOUNTER — Ambulatory Visit: Payer: Self-pay

## 2020-10-21 ENCOUNTER — Telehealth: Payer: Self-pay | Admitting: Cardiology

## 2020-10-21 MED ORDER — BENAZEPRIL HCL 40 MG PO TABS: 40.0000 mg | ORAL_TABLET | Freq: Every day | ORAL | 0 refills | Status: DC

## 2020-10-21 MED ORDER — AMLODIPINE BESYLATE 10 MG PO TABS: 1.0000 | ORAL_TABLET | Freq: Every day | ORAL | 0 refills | Status: DC

## 2020-10-21 NOTE — Telephone Encounter (Signed)
Noted  

## 2020-10-21 NOTE — Telephone Encounter (Signed)
Pt. Reports she has been out of BP medication since Sunday. Today BP is 183/91.States sometimes I "have a little chest pain." Instructed to call 911 for worsening of any symptoms. Verbalizes understanding.Has appointment Friday. Asking for refills of amlodipine and benazepril. Please advise.  Answer Assessment - Initial Assessment Questions 1. BLOOD PRESSURE: "What is the blood pressure?" "Did you take at least two measurements 5 minutes apart?"     18 3/91 2. ONSET: "When did you take your blood pressure?"     This morning 3. HOW: "How did you obtain the blood pressure?" (e.g., visiting nurse, automatic home BP monitor)     Home cuff 4. HISTORY: "Do you have a history of high blood pressure?"     Yes 5. MEDICATIONS: "Are you taking any medications for blood pressure?" "Have you missed any doses recently?"     3 days 6. OTHER SYMPTOMS: "Do you have any symptoms?" (e.g., headache, chest pain, blurred vision, difficulty breathing, weakness)     Has some chest pain 7. PREGNANCY: "Is there any chance you are pregnant?" "When was your last menstrual period?"     No  Protocols used: BLOOD PRESSURE - HIGH-A-AH

## 2020-10-21 NOTE — Telephone Encounter (Signed)
Pt c/o Syncope: STAT if syncope occurred within 30 minutes and pt complains of lightheadedness High Priority if episode of passing out, completely, today or in last 24 hours   1. Did you pass out today? no  2. When is the last time you passed out?  Yesterday   3. Has this occurred multiple times? Yes sometimes with panic attacks   4. Did you have any symptoms prior to passing out? Sometimes sweaty clammy before sometimes no warning    Patient not able to drive to her appt tomorrow and wants a virtual visit.  Advised patient virtual not appropriate considering last visit virtual, ekg needs, syncope present issue , htn  180/90's .    Patient given number to [facility] [employer] and agrees to keep her appt.

## 2020-10-21 NOTE — Telephone Encounter (Signed)
5 day supply sent for patient to get her through to her appointment.  The amlodipine will not go through electronically.  Can you call in a 5 day supply.  Thank you.

## 2020-10-21 NOTE — Telephone Encounter (Signed)
Prescriptions called in for the patient. Called and notified her of this.

## 2020-10-22 ENCOUNTER — Ambulatory Visit: Payer: Medicare HMO | Admitting: Cardiology

## 2020-10-23 ENCOUNTER — Encounter: Payer: Self-pay | Admitting: Nurse Practitioner

## 2020-10-23 ENCOUNTER — Ambulatory Visit (INDEPENDENT_AMBULATORY_CARE_PROVIDER_SITE_OTHER): Payer: Medicare HMO | Admitting: Nurse Practitioner

## 2020-10-23 ENCOUNTER — Telehealth: Payer: Self-pay

## 2020-10-23 ENCOUNTER — Encounter: Payer: Self-pay | Admitting: Cardiology

## 2020-10-23 VITALS — BP 166/102 | HR 87 | Wt 118.0 lb

## 2020-10-23 DIAGNOSIS — I10 Essential (primary) hypertension: Secondary | ICD-10-CM | POA: Diagnosis not present

## 2020-10-23 DIAGNOSIS — M961 Postlaminectomy syndrome, not elsewhere classified: Secondary | ICD-10-CM | POA: Diagnosis not present

## 2020-10-23 MED ORDER — SERTRALINE HCL 100 MG PO TABS: 150.0000 mg | ORAL_TABLET | Freq: Every day | ORAL | 1 refills | Status: DC

## 2020-10-23 MED ORDER — ATORVASTATIN CALCIUM 80 MG PO TABS: 80.0000 mg | ORAL_TABLET | Freq: Every day | ORAL | 1 refills | Status: DC

## 2020-10-23 MED ORDER — ALBUTEROL SULFATE HFA 108 (90 BASE) MCG/ACT IN AERS: 2.0000 | INHALATION_SPRAY | Freq: Four times a day (QID) | RESPIRATORY_TRACT | 0 refills | Status: DC | PRN

## 2020-10-23 MED ORDER — CYCLOBENZAPRINE HCL 5 MG PO TABS: ORAL_TABLET | ORAL | 0 refills | Status: DC

## 2020-10-23 MED ORDER — BENAZEPRIL HCL 40 MG PO TABS: 40.0000 mg | ORAL_TABLET | Freq: Every day | ORAL | 1 refills | Status: DC

## 2020-10-23 MED ORDER — AMLODIPINE BESYLATE 10 MG PO TABS: 1.0000 | ORAL_TABLET | Freq: Every day | ORAL | 1 refills | Status: DC

## 2020-10-23 NOTE — Progress Notes (Signed)
BP (!) 166/102   Pulse 87   Wt 118 lb (53.5 kg)   BMI 19.05 kg/m    Subjective:    Patient ID: Caitlyn May, female    DOB: [birth year], 65 y.o.   MRN: [id number]  HPI: Caitlyn May is a 65 y.o. female  Chief Complaint  Patient presents with  . Hypertension   HYPERTENSION Hypertension status: uncontrolled Patient has been out of medication. Satisfied with current treatment? yes Duration of hypertension: years BP monitoring frequency:  weekly BP range: 140-160/80 BP medication side effects:  no Medication compliance: excellent compliance Previous BP meds:Carvedilol, amlodipine, benazepril and verapamil Aspirin: no Recurrent headaches: no Visual changes: no Palpitations: no Dyspnea: yes Chest pain: no Lower extremity edema: yes Dizzy/lightheaded: yes   Patient states she Flexeril helps with the shoulder pain she is experiencing.  Relevant past medical, surgical, family and social history reviewed and updated as indicated. Interim medical history since our last visit reviewed. Allergies and medications reviewed and updated.  Review of Systems  Eyes: Negative for visual disturbance.  Respiratory: Positive for shortness of breath. Negative for cough and chest tightness.   Cardiovascular: Positive for leg swelling. Negative for chest pain and palpitations.  Neurological: Positive for dizziness. Negative for headaches.    Per HPI unless specifically indicated above     Objective:    BP (!) 166/102   Pulse 87   Wt 118 lb (53.5 kg)   BMI 19.05 kg/m   Wt Readings from Last 3 Encounters:  10/23/20 118 lb (53.5 kg)  06/12/20 115 lb (52.2 kg)  06/09/20 114 lb (51.7 kg)    Physical Exam Vitals and nursing note reviewed.  Pulmonary:     Effort: Pulmonary effort is normal. No respiratory distress.  Neurological:     Mental Status: She is alert.  Psychiatric:        Mood and Affect: Mood normal.        Behavior: Behavior normal.        Thought Content:  Thought content normal.        Judgment: Judgment normal.     Results for orders placed or performed in visit on 05/19/20  Microscopic Examination   Urine  Result Value Ref Range   WBC, UA None seen 0 - 5 /hpf   RBC 0-2 0 - 2 /hpf   Epithelial Cells (non renal) 0-10 0 - 10 /hpf   Bacteria, UA None seen None seen/Few  Urinalysis, Routine w reflex microscopic  Result Value Ref Range   Specific Gravity, UA 1.010 1.005 - 1.030   pH, UA 5.5 5.0 - 7.5   Color, UA Yellow Yellow   Appearance Ur Clear Clear   Leukocytes,UA Negative Negative   Protein,UA Negative Negative/Trace   Glucose, UA Negative Negative   Ketones, UA Negative Negative   RBC, UA Trace (A) Negative   Bilirubin, UA Negative Negative   Urobilinogen, Ur 0.2 0.2 - 1.0 mg/dL   Nitrite, UA Negative Negative   Microscopic Examination See below:       Assessment & Plan:   Problem List Items Addressed This Visit      Cardiovascular and Mediastinum   Hypertension - Primary    Chronic.  Uncontrolled.  Medications refilled during visit today.  Patient will follow up in office for lab work and physical exam.        Relevant Medications   amLODipine (NORVASC) 10 MG tablet   atorvastatin (LIPITOR) 80 MG tablet   benazepril (LOTENSIN) 40  MG tablet     Other   Postlaminectomy syndrome, lumbar region (Chronic)   Relevant Medications   cyclobenzaprine (FLEXERIL) 5 MG tablet       Follow up plan: Return in about 3 weeks (around 11/13/2020) for BP Check.    This visit was completed via MyChart due to the restrictions of the COVID-19 pandemic. All issues as above were discussed and addressed. Physical exam was done as above through visual confirmation on MyChart. If it was felt that the patient should be evaluated in the office, they were directed there. The patient verbally consented to this visit. 1. Location of the patient: Home 2. Location of the provider: Office 3. Those involved with this call:  ? Provider: Jenetta Downer ? CMA: Yvonna Alanis, CMA ? Front Desk/Registration: Jill Side 4. Time spent on call: 15 minutes with patient via phone conference. More than 50% of this time was spent in counseling and coordination of care. 21 minutes total spent in review of patient's record and preparation of their chart.

## 2020-10-23 NOTE — Telephone Encounter (Signed)
error 

## 2020-10-23 NOTE — Telephone Encounter (Signed)
Called pt to collect co pay no answer left vm

## 2020-10-27 ENCOUNTER — Telehealth: Payer: Self-pay

## 2020-10-27 NOTE — Telephone Encounter (Signed)
-----   Message from Jon Billings, NP sent at 10/27/2020  7:58 AM EDT ----- Can we make her follow up?

## 2020-10-27 NOTE — Telephone Encounter (Signed)
Please send electronically

## 2020-10-27 NOTE — Telephone Encounter (Signed)
lvm to make this apt Return in about 3 weeks (around 11/13/2020) for BP Check

## 2020-10-27 NOTE — Assessment & Plan Note (Signed)
Chronic.  Uncontrolled.  Medications refilled during visit today.  Patient will follow up in office for lab work and physical exam.

## 2020-10-28 MED ORDER — AMLODIPINE BESYLATE 10 MG PO TABS: 1.0000 | ORAL_TABLET | Freq: Every day | ORAL | 1 refills | Status: DC

## 2020-10-28 MED ORDER — BENAZEPRIL HCL 40 MG PO TABS: 40.0000 mg | ORAL_TABLET | Freq: Every day | ORAL | 1 refills | Status: DC

## 2020-11-04 NOTE — Telephone Encounter (Signed)
Patient is calling to ask why she cannot get her BP medication filled.  She does not have any more left and the pharmacy said it has not been sent there.  Please advise.

## 2020-11-04 NOTE — Telephone Encounter (Signed)
Medication called into pharmacy and left on answering machine.  Left a message letting patient know.

## 2020-11-13 ENCOUNTER — Ambulatory Visit: Payer: Medicare HMO | Admitting: Nurse Practitioner

## 2020-12-04 ENCOUNTER — Ambulatory Visit: Payer: Medicare HMO | Admitting: Nurse Practitioner

## 2020-12-04 ENCOUNTER — Telehealth: Payer: Medicare HMO | Admitting: Nurse Practitioner

## 2020-12-04 ENCOUNTER — Telehealth: Payer: Self-pay | Admitting: Nurse Practitioner

## 2020-12-04 MED ORDER — AMLODIPINE BESYLATE 10 MG PO TABS: 10.0000 mg | ORAL_TABLET | Freq: Every day | ORAL | 0 refills | Status: DC

## 2020-12-04 MED ORDER — BENAZEPRIL HCL 40 MG PO TABS: 40.0000 mg | ORAL_TABLET | Freq: Every day | ORAL | 0 refills | Status: DC

## 2020-12-04 MED ORDER — CARVEDILOL 12.5 MG PO TABS: 12.5000 mg | ORAL_TABLET | Freq: Two times a day (BID) | ORAL | 0 refills | Status: DC

## 2020-12-04 NOTE — Progress Notes (Deleted)
   There were no vitals taken for this visit.   Subjective:    Patient ID: Caitlyn May, female    DOB: [birth year], 65 y.o.   MRN: [id number]  HPI: Caitlyn May is a 65 y.o. female  No chief complaint on file.  HYPERTENSION Hypertension status: {Blank single:19197::"controlled","uncontrolled","better","worse","exacerbated","stable"}  Satisfied with current treatment? {Blank single:19197::"yes","no"} Duration of hypertension: {Blank single:19197::"chronic","months","years"} BP monitoring frequency:  {Blank single:19197::"not checking","rarely","daily","weekly","monthly","a few times a day","a few times a week","a few times a month"} BP range:  BP medication side effects:  {Blank single:19197::"yes","no"} Medication compliance: {Blank single:19197::"excellent compliance","good compliance","fair compliance","poor compliance"} Previous BP meds:{Blank [id number]:[id number]::"GYBW","LSLHTDSKAJ","GOTLXBWIOM/BTDHRCBULA","GTXMIWOE","HOZYYQMGNO","IBBCWUGQBV/QXIH","WTUUEKCMKL (bystolic)","carvedilol","chlorthalidone","clonidine","diltiazem","exforge HCT","HCTZ","irbesartan (avapro)","labetalol","lisinopril","lisinopril-HCTZ","losartan (cozaar)","methyldopa","nifedipine","olmesartan (benicar)","olmesartan-HCTZ","quinapril","ramipril","spironalactone","tekturna","valsartan","valsartan-HCTZ","verapamil"} Aspirin: {Blank single:19197::"yes","no"} Recurrent headaches: {Blank single:19197::"yes","no"} Visual changes: {Blank single:19197::"yes","no"} Palpitations: {Blank single:19197::"yes","no"} Dyspnea: {Blank single:19197::"yes","no"} Chest pain: {Blank single:19197::"yes","no"} Lower extremity edema: {Blank single:19197::"yes","no"} Dizzy/lightheaded: {Blank single:19197::"yes","no"}  COPD COPD status: {Blank single:19197::"controlled","uncontrolled","better","worse","exacerbated","stable"} Satisfied with current treatment?: {Blank single:19197::"yes","no"} Oxygen use: {Blank  single:19197::"yes","no"} Dyspnea frequency:  Cough frequency:  Rescue inhaler frequency:   Limitation of activity: {Blank single:19197::"yes","no"} Productive cough:  Last Spirometry:  Pneumovax: {Blank single:19197::"Up to Date","Not up to Date","unknown"} Influenza: {Blank single:19197::"Up to Date","Not up to Date","unknown"}  DEPRESSION  Relevant past medical, surgical, family and social history reviewed and updated as indicated. Interim medical history since our last visit reviewed. Allergies and medications reviewed and updated.  Review of Systems  Per HPI unless specifically indicated above     Objective:    There were no vitals taken for this visit.  Wt Readings from Last 3 Encounters:  10/23/20 118 lb (53.5 kg)  06/12/20 115 lb (52.2 kg)  06/09/20 114 lb (51.7 kg)    Physical Exam  Results for orders placed or performed in visit on 05/19/20  Microscopic Examination   Urine  Result Value Ref Range   WBC, UA None seen 0 - 5 /hpf   RBC 0-2 0 - 2 /hpf   Epithelial Cells (non renal) 0-10 0 - 10 /hpf   Bacteria, UA None seen None seen/Few  Urinalysis, Routine w reflex microscopic  Result Value Ref Range   Specific Gravity, UA 1.010 1.005 - 1.030   pH, UA 5.5 5.0 - 7.5   Color, UA Yellow Yellow   Appearance Ur Clear Clear   Leukocytes,UA Negative Negative   Protein,UA Negative Negative/Trace   Glucose, UA Negative Negative   Ketones, UA Negative Negative   RBC, UA Trace (A) Negative   Bilirubin, UA Negative Negative   Urobilinogen, Ur 0.2 0.2 - 1.0 mg/dL   Nitrite, UA Negative Negative   Microscopic Examination See below:       Assessment & Plan:   Problem List Items Addressed This Visit       Cardiovascular and Mediastinum   Hypertension     Respiratory   COPD (chronic obstructive pulmonary disease) (HCC) - Primary     Other   Depression, recurrent (HCC)   Hyperlipidemia   Elevated TSH   Vitamin D deficiency     Follow up plan: No  follow-ups on file.

## 2020-12-04 NOTE — Telephone Encounter (Signed)
Medication sent to the pharmacy.

## 2020-12-15 ENCOUNTER — Other Ambulatory Visit: Payer: Self-pay

## 2020-12-15 ENCOUNTER — Encounter: Payer: Self-pay | Admitting: Nurse Practitioner

## 2020-12-15 ENCOUNTER — Ambulatory Visit (INDEPENDENT_AMBULATORY_CARE_PROVIDER_SITE_OTHER): Payer: Medicare HMO | Admitting: Nurse Practitioner

## 2020-12-15 VITALS — BP 121/76 | HR 78 | Temp 98.4°F | Wt 115.2 lb

## 2020-12-15 DIAGNOSIS — F339 Major depressive disorder, recurrent, unspecified: Secondary | ICD-10-CM | POA: Diagnosis not present

## 2020-12-15 DIAGNOSIS — N182 Chronic kidney disease, stage 2 (mild): Secondary | ICD-10-CM | POA: Insufficient documentation

## 2020-12-15 DIAGNOSIS — I10 Essential (primary) hypertension: Secondary | ICD-10-CM

## 2020-12-15 DIAGNOSIS — E782 Mixed hyperlipidemia: Secondary | ICD-10-CM

## 2020-12-15 DIAGNOSIS — F419 Anxiety disorder, unspecified: Secondary | ICD-10-CM

## 2020-12-15 DIAGNOSIS — M5030 Other cervical disc degeneration, unspecified cervical region: Secondary | ICD-10-CM | POA: Diagnosis not present

## 2020-12-15 DIAGNOSIS — I700 Atherosclerosis of aorta: Secondary | ICD-10-CM | POA: Diagnosis not present

## 2020-12-15 DIAGNOSIS — E559 Vitamin D deficiency, unspecified: Secondary | ICD-10-CM

## 2020-12-15 DIAGNOSIS — J449 Chronic obstructive pulmonary disease, unspecified: Secondary | ICD-10-CM | POA: Diagnosis not present

## 2020-12-15 LAB — MICROSCOPIC EXAMINATION: RBC, Urine: NONE SEEN /hpf (ref 0–2)

## 2020-12-15 LAB — URINALYSIS, ROUTINE W REFLEX MICROSCOPIC
Bilirubin, UA: NEGATIVE
Glucose, UA: NEGATIVE
Ketones, UA: NEGATIVE
Leukocytes,UA: NEGATIVE
Nitrite, UA: NEGATIVE
RBC, UA: NEGATIVE
Specific Gravity, UA: 1.01 (ref 1.005–1.030)
Urobilinogen, Ur: 0.2 mg/dL (ref 0.2–1.0)
pH, UA: 6 (ref 5.0–7.5)

## 2020-12-15 MED ORDER — STIOLTO RESPIMAT 2.5-2.5 MCG/ACT IN AERS: 2.0000 | INHALATION_SPRAY | Freq: Every day | RESPIRATORY_TRACT | 1 refills | Status: DC

## 2020-12-15 MED ORDER — GABAPENTIN 300 MG PO CAPS: 300.0000 mg | ORAL_CAPSULE | Freq: Three times a day (TID) | ORAL | 3 refills | Status: DC

## 2020-12-15 NOTE — Assessment & Plan Note (Signed)
Ongoing pain. Patient would like to see a specialist to help with her Arthritis. She is okay with doing cortisone injections but not interested in further intervention.  States gabapentin has helped her in the past and wondering if she can restart that.  Referral placed to see orthopedics.  Also discussed the possibility of needing to see pain management in the future.

## 2020-12-15 NOTE — Assessment & Plan Note (Signed)
Chronic.  Controlled.  Continue with current medication regimen.  Labs ordered today.  Return to clinic in 6 months for reevaluation.  Call sooner if concerns arise.  ? ?

## 2020-12-15 NOTE — Assessment & Plan Note (Signed)
Chronic.  Ongoing.  Labs ordered today. Continue with Atorvastatin 80mg  daily.  Will make recommendations based on lab results.  Follow up in 3 months.

## 2020-12-15 NOTE — Progress Notes (Signed)
BP 121/76   Pulse 78   Temp 98.4 F (36.9 C)   Wt 115 lb 4 oz (52.3 kg)   SpO2 97%   BMI 18.60 kg/m    Subjective:    Patient ID: Caitlyn May, female    DOB: Jan 20, 1956, 65 y.o.   MRN: [id number]  HPI: Caitlyn May is a 65 y.o. female  Chief Complaint  Patient presents with   COPD   Hypertension   Arthritis    Would like a referral   Cyst    Tailbone   HYPERTENSION / HYPERLIPIDEMIA Satisfied with current treatment? yes Duration of hypertension: years BP monitoring frequency: daily BP range: 120/80 BP medication side effects: no Past BP meds: amlodipine, benazepril, and carvedilol Duration of hyperlipidemia: years Cholesterol medication side effects: no Cholesterol supplements: none Past cholesterol medications: atorvastain (lipitor) Medication compliance: excellent compliance Aspirin: no Recent stressors: no Recurrent headaches: no Visual changes: no Palpitations: no Dyspnea: yes Chest pain: no Lower extremity edema: no Dizzy/lightheaded: no  COPD Down to 1.5-1 ppd which is down from 2ppd. COPD status: uncontrolled Satisfied with current treatment?: no Oxygen use: no Dyspnea frequency: all day everyday Cough frequency: comes and goes throughout the day Rescue inhaler frequency:  everyday 1-2 times per day Limitation of activity: yes Productive cough: yes- states it is very thick Last Spirometry:  Pneumovax: Up to Date Influenza: Up to Date  DEPRESSION/ANXIETY Patient states she is well controlled at times.  Does not have any thoughts of self harm or SI.  She is sad that she doesn't have any family members left. She feels like if her arthritis and her breathing were better controlled than she would be happier.   CHRONIC PAIN Patient states she is constantly in chronic pain. She would like to see someone who will help her with the arthritis.    Relevant past medical, surgical, family and social history reviewed and updated as indicated.  Interim medical history since our last visit reviewed. Allergies and medications reviewed and updated.  Review of Systems  Eyes:  Negative for visual disturbance.  Respiratory:  Positive for cough and shortness of breath. Negative for chest tightness.   Cardiovascular:  Negative for chest pain, palpitations and leg swelling.  Neurological:  Negative for dizziness and headaches.   Per HPI unless specifically indicated above     Objective:    BP 121/76   Pulse 78   Temp 98.4 F (36.9 C)   Wt 115 lb 4 oz (52.3 kg)   SpO2 97%   BMI 18.60 kg/m   Wt Readings from Last 3 Encounters:  12/15/20 115 lb 4 oz (52.3 kg)  10/23/20 118 lb (53.5 kg)  06/12/20 115 lb (52.2 kg)    Physical Exam Vitals and nursing note reviewed.  Constitutional:      General: She is not in acute distress.    Appearance: Normal appearance. She is normal weight. She is not ill-appearing, toxic-appearing or diaphoretic.  HENT:     Head: Normocephalic.     Right Ear: External ear normal.     Left Ear: External ear normal.     Nose: Nose normal.     Mouth/Throat:     Mouth: Mucous membranes are moist.     Pharynx: Oropharynx is clear.  Eyes:     General:        Right eye: No discharge.        Left eye: No discharge.     Extraocular Movements: Extraocular movements intact.  Conjunctiva/sclera: Conjunctivae normal.     Pupils: Pupils are equal, round, and reactive to light.  Cardiovascular:     Rate and Rhythm: Normal rate and regular rhythm.     Heart sounds: No murmur heard. Pulmonary:     Effort: Pulmonary effort is normal. No respiratory distress.     Breath sounds: Wheezing present. No rales.  Musculoskeletal:     Cervical back: Normal range of motion and neck supple.  Skin:    General: Skin is warm and dry.     Capillary Refill: Capillary refill takes less than 2 seconds.  Neurological:     General: No focal deficit present.     Mental Status: She is alert and oriented to person, place, and  time. Mental status is at baseline.  Psychiatric:        Mood and Affect: Mood normal.        Behavior: Behavior normal.        Thought Content: Thought content normal.        Judgment: Judgment normal.    Results for orders placed or performed in visit on 05/19/20  Microscopic Examination   Urine  Result Value Ref Range   WBC, UA None seen 0 - 5 /hpf   RBC 0-2 0 - 2 /hpf   Epithelial Cells (non renal) 0-10 0 - 10 /hpf   Bacteria, UA None seen None seen/Few  Urinalysis, Routine w reflex microscopic  Result Value Ref Range   Specific Gravity, UA 1.010 1.005 - 1.030   pH, UA 5.5 5.0 - 7.5   Color, UA Yellow Yellow   Appearance Ur Clear Clear   Leukocytes,UA Negative Negative   Protein,UA Negative Negative/Trace   Glucose, UA Negative Negative   Ketones, UA Negative Negative   RBC, UA Trace (A) Negative   Bilirubin, UA Negative Negative   Urobilinogen, Ur 0.2 0.2 - 1.0 mg/dL   Nitrite, UA Negative Negative   Microscopic Examination See below:       Assessment & Plan:   Problem List Items Addressed This Visit       Cardiovascular and Mediastinum   Hypertension    Chronic.  Controlled.  Continue with current medication regimen.  Labs ordered today.  Return to clinic in 6 months for reevaluation.  Call sooner if concerns arise.         Relevant Orders   Comp Met (CMET)   Lipid Profile   Vitamin D (25 hydroxy)   CBC w/Diff   Urinalysis, Routine w reflex microscopic   Aortic atherosclerosis (HCC) - Primary    Chronic.  Ongoing.  Labs ordered today. Continue with Atorvastatin 86m daily.  Will make recommendations based on lab results.  Follow up in 3 months.       Relevant Orders   Comp Met (CMET)   Lipid Profile   Vitamin D (25 hydroxy)   CBC w/Diff     Respiratory   COPD (chronic obstructive pulmonary disease) (HCC)    Chronic.  Not well controlled. Patient does not currently use a daily inhaler. BJudithann Saugerwas too expensive for her. Sample given of Stiolto  2.598m. Prescription also sent to the pharmacy for patient to begin taking.  Follow up in 1 month for reevaluation.       Relevant Medications   Tiotropium Bromide-Olodaterol (STIOLTO RESPIMAT) 2.5-2.5 MCG/ACT AERS   Other Relevant Orders   Comp Met (CMET)   Lipid Profile   Vitamin D (25 hydroxy)   CBC w/Diff   Urinalysis,  Routine w reflex microscopic     Musculoskeletal and Integument   DDD (degenerative disc disease), cervical (Chronic)    Ongoing pain. Patient would like to see a specialist to help with her Arthritis. She is okay with doing cortisone injections but not interested in further intervention.  States gabapentin has helped her in the past and wondering if she can restart that.  Referral placed to see orthopedics.  Also discussed the possibility of needing to see pain management in the future.        Relevant Orders   Ambulatory referral to Orthopedics     Genitourinary   Stage 2 chronic kidney disease    Chronic.  Discussed CKD with patient during visit today. This was the first time patient has been told this. Discontinued her Mobic due to CKD. Discussed that she needs to avoids NSAIDS. Labs ordered today. Will make further recommendations based on lab results.          Other   Depression, recurrent ([facility])    Chronic. Ongoing. Continue with Zoloft 16m daily. Will reassess at future visits.        Relevant Orders   Urinalysis, Routine w reflex microscopic   Hyperlipidemia    Chronic.  Controlled.  Continue with current medication regimen.  Labs ordered today.  Return to clinic in 6 months for reevaluation.  Call sooner if concerns arise.         Relevant Orders   Lipid Profile   Vitamin D deficiency    Labs ordered today. Will make recommendations based on lab results.        Relevant Orders   Vitamin D (25 hydroxy)   Anxiety    Chronic. Ongoing. Continue with Zoloft 1510mdaily. Will reassess at future visits.          Follow up plan: Return  in about 1 month (around 01/15/2021) for breathing and gabapentin.

## 2020-12-15 NOTE — Assessment & Plan Note (Signed)
Labs ordered today.  Will make recommendations based on lab results. ?

## 2020-12-15 NOTE — Assessment & Plan Note (Signed)
Chronic. Ongoing. Continue with Zoloft 150mg  daily. Will reassess at future visits.

## 2020-12-15 NOTE — Assessment & Plan Note (Signed)
Chronic.  Discussed CKD with patient during visit today. This was the first time patient has been told this. Discontinued her Mobic due to CKD. Discussed that she needs to avoids NSAIDS. Labs ordered today. Will make further recommendations based on lab results.

## 2020-12-15 NOTE — Assessment & Plan Note (Signed)
Chronic.  Not well controlled. Patient does not currently use a daily inhaler. Caitlyn May was too expensive for her. Sample given of Stiolto 2.69mcg. Prescription also sent to the pharmacy for patient to begin taking.  Follow up in 1 month for reevaluation.

## 2020-12-16 LAB — COMPREHENSIVE METABOLIC PANEL
ALT: 14 IU/L (ref 0–32)
AST: 15 IU/L (ref 0–40)
Albumin/Globulin Ratio: 1.6 (ref 1.2–2.2)
Albumin: 4.3 g/dL (ref 3.8–4.8)
Alkaline Phosphatase: 71 IU/L (ref 44–121)
BUN/Creatinine Ratio: 17 (ref 12–28)
BUN: 17 mg/dL (ref 8–27)
Bilirubin Total: 0.5 mg/dL (ref 0.0–1.2)
CO2: 25 mmol/L (ref 20–29)
Calcium: 9.2 mg/dL (ref 8.7–10.3)
Chloride: 103 mmol/L (ref 96–106)
Creatinine, Ser: 1 mg/dL (ref 0.57–1.00)
Globulin, Total: 2.7 g/dL (ref 1.5–4.5)
Glucose: 86 mg/dL (ref 65–99)
Potassium: 3.6 mmol/L (ref 3.5–5.2)
Sodium: 143 mmol/L (ref 134–144)
Total Protein: 7 g/dL (ref 6.0–8.5)
eGFR: 62 mL/min/{1.73_m2} (ref >59)

## 2020-12-16 LAB — CBC WITH DIFFERENTIAL/PLATELET
Basophils Absolute: 0.1 10*3/uL (ref 0.0–0.2)
Basos: 1 %
EOS (ABSOLUTE): 0.3 10*3/uL (ref 0.0–0.4)
Eos: 2 %
Hematocrit: 44.4 % (ref 34.0–46.6)
Hemoglobin: 14.6 g/dL (ref 11.1–15.9)
Immature Grans (Abs): 0.1 10*3/uL (ref 0.0–0.1)
Immature Granulocytes: 1 %
Lymphocytes Absolute: 2.1 10*3/uL (ref 0.7–3.1)
Lymphs: 20 %
MCH: 31.1 pg (ref 26.6–33.0)
MCHC: 32.9 g/dL (ref 31.5–35.7)
MCV: 95 fL (ref 79–97)
Monocytes Absolute: 0.7 10*3/uL (ref 0.1–0.9)
Monocytes: 7 %
Neutrophils Absolute: 7.3 10*3/uL — ABNORMAL HIGH (ref 1.4–7.0)
Neutrophils: 69 %
Platelets: 291 10*3/uL (ref 150–450)
RBC: 4.69 x10E6/uL (ref 3.77–5.28)
RDW: 11.8 % (ref 11.7–15.4)
WBC: 10.4 10*3/uL (ref 3.4–10.8)

## 2020-12-16 LAB — LIPID PANEL
Chol/HDL Ratio: 2.3 ratio (ref 0.0–4.4)
Cholesterol, Total: 128 mg/dL (ref 100–199)
HDL: 56 mg/dL (ref >39)
LDL Chol Calc (NIH): 52 mg/dL (ref 0–99)
Triglycerides: 109 mg/dL (ref 0–149)
VLDL Cholesterol Cal: 20 mg/dL (ref 5–40)

## 2020-12-16 LAB — VITAMIN D 25 HYDROXY (VIT D DEFICIENCY, FRACTURES): Vit D, 25-Hydroxy: 37 ng/mL (ref 30.0–100.0)

## 2020-12-16 NOTE — Progress Notes (Signed)
Please let patient know that her lab work looks great.  Kidney function has actually improved which is great news.  All of your other blood work looks great too.  We will continue to monitor these in the future.  See you at our next visit.

## 2020-12-23 ENCOUNTER — Other Ambulatory Visit: Payer: Self-pay

## 2020-12-23 DIAGNOSIS — M961 Postlaminectomy syndrome, not elsewhere classified: Secondary | ICD-10-CM

## 2020-12-23 MED ORDER — CYCLOBENZAPRINE HCL 5 MG PO TABS: ORAL_TABLET | ORAL | 0 refills | Status: DC

## 2020-12-28 ENCOUNTER — Telehealth: Payer: Self-pay

## 2020-12-28 NOTE — Telephone Encounter (Signed)
Patient's kidney function has declined. Meloxicam was discontinued at last visit and already discussed with patient.

## 2020-12-28 NOTE — Telephone Encounter (Signed)
Incoming fax requesting a refill on Meloxicam 7.'5mg'$ 

## 2020-12-28 NOTE — Telephone Encounter (Signed)
Attempted to call patient no answer, unable to leave VM

## 2020-12-31 DIAGNOSIS — M5416 Radiculopathy, lumbar region: Secondary | ICD-10-CM | POA: Diagnosis not present

## 2020-12-31 DIAGNOSIS — M5412 Radiculopathy, cervical region: Secondary | ICD-10-CM | POA: Diagnosis not present

## 2021-01-04 ENCOUNTER — Other Ambulatory Visit: Payer: Self-pay

## 2021-01-05 ENCOUNTER — Other Ambulatory Visit: Payer: Self-pay | Admitting: Nurse Practitioner

## 2021-01-05 MED ORDER — CARVEDILOL 12.5 MG PO TABS: 12.5000 mg | ORAL_TABLET | Freq: Two times a day (BID) | ORAL | 0 refills | Status: DC

## 2021-01-05 MED ORDER — AMLODIPINE BESYLATE 10 MG PO TABS: 10.0000 mg | ORAL_TABLET | Freq: Every day | ORAL | 1 refills | Status: DC

## 2021-01-05 NOTE — Telephone Encounter (Signed)
Medication Refill - Medication: carvedilol (COREG) 12.5 MG tablet   Has the patient contacted their pharmacy? Yes.   (Agent: If no, request that the patient contact the pharmacy for the refill.) (Agent: If yes, when and what did the pharmacy advise?)Pt stated she was having problems with another pharmacy and asked to have this refill sent today to [facility]   Preferred Pharmacy (with phone number or street name): [facility]   Agent: Please be advised that RX refills may take up to 3 business days. We ask that you follow-up with your pharmacy.

## 2021-01-21 ENCOUNTER — Ambulatory Visit: Payer: Medicare HMO | Admitting: Nurse Practitioner

## 2021-01-21 IMAGING — CT CT ABD-PELV W/ CM
2 of 5 series · 15 of 46 positions shown, 17 images · IV contrast (omnipaque)
Comparison: 11/28/2014

CLINICAL DATA: Intractable vomiting with nausea.  Weight loss.

EXAM:
CT ABDOMEN AND PELVIS WITH CONTRAST
TECHNIQUE: Multidetector CT imaging of the abdomen and pelvis was performed
using the standard protocol following bolus administration of
intravenous contrast.
CONTRAST:  75mL OMNIPAQUE IOHEXOL 300 MG/ML  SOLN

[Series 2: abd pelvis 5.00 · axial · 0.65mm/px · z∈[-1557,-1202]mm · 12 of 81 slices shown, 14 images]
[im 5/81  soft-tissue]
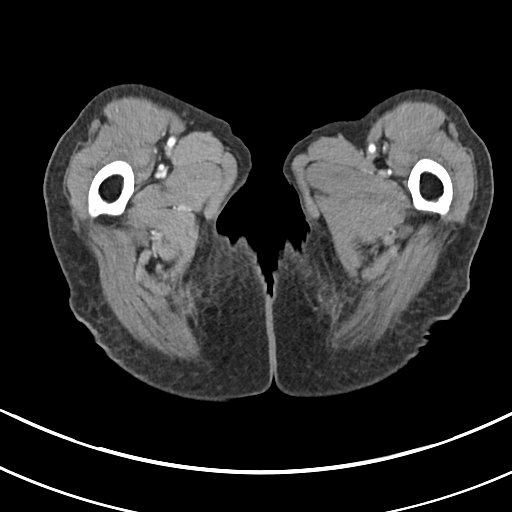
[im 5/81  bone]
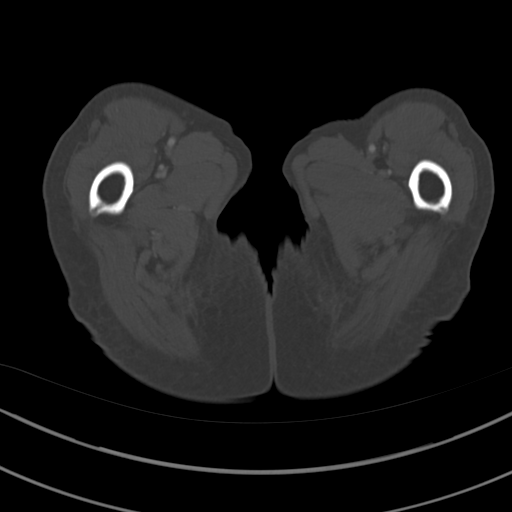
[im 14/81  soft-tissue]
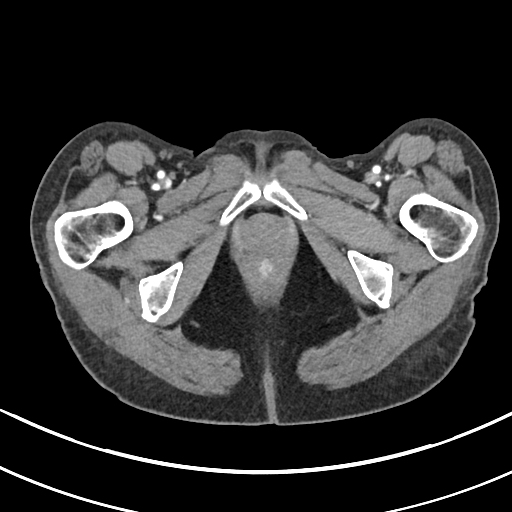
[im 18/81  soft-tissue]
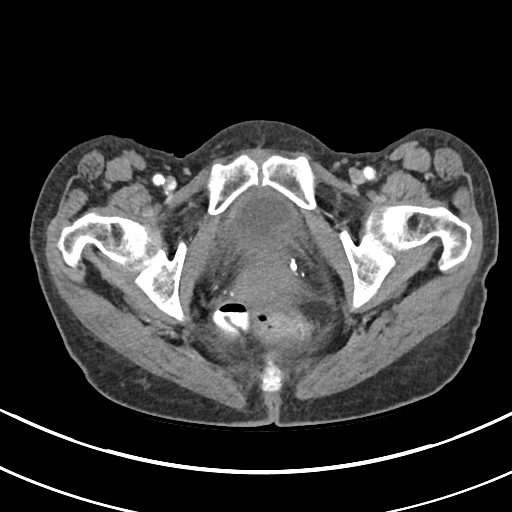
[im 23/81  soft-tissue]
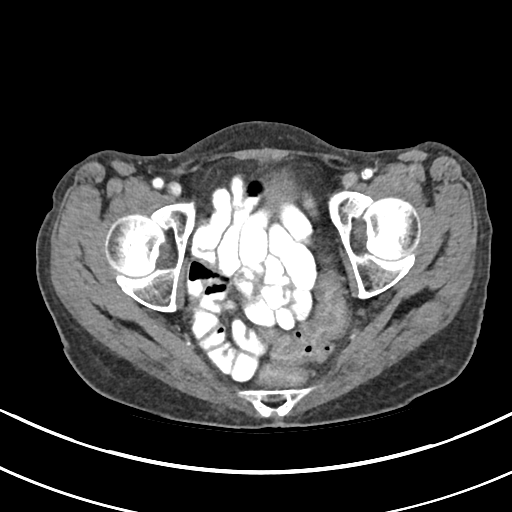
[im 32/81  soft-tissue]
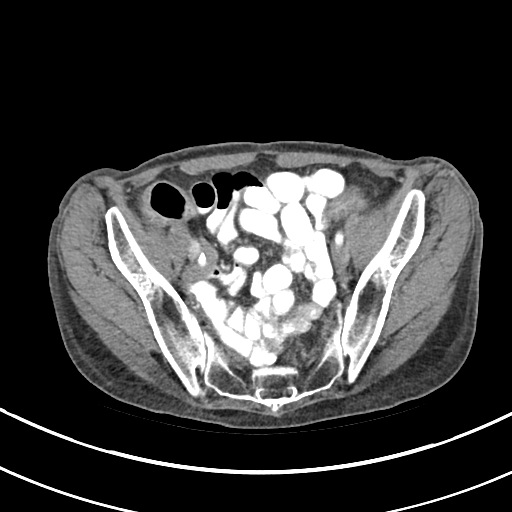
[im 36/81  soft-tissue]
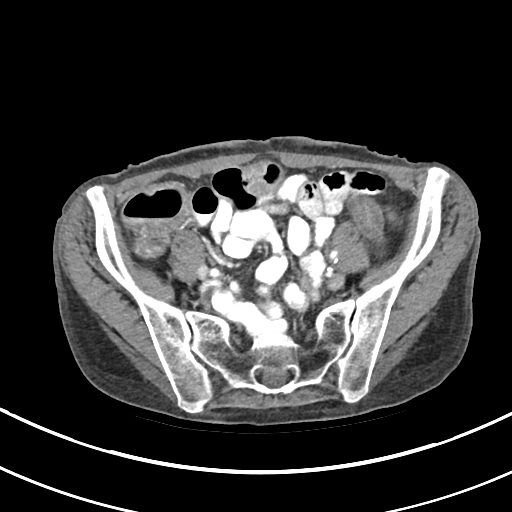
[im 45/81  soft-tissue]
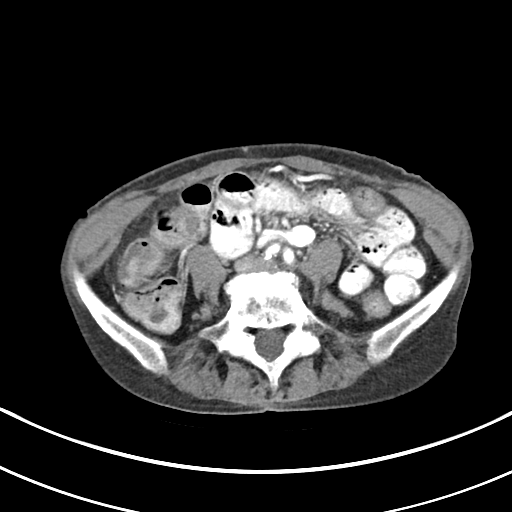
[im 49/81  soft-tissue]
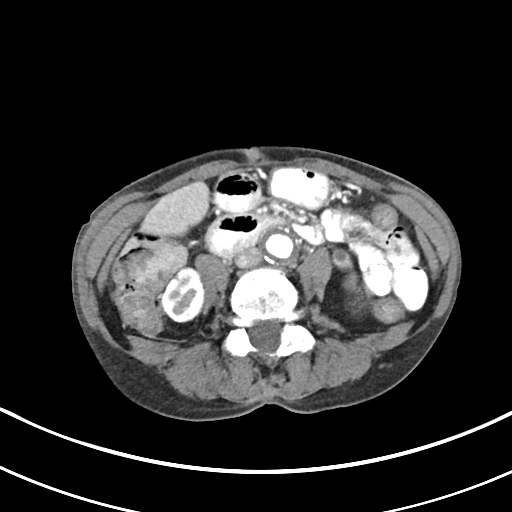
[im 58/81  soft-tissue]
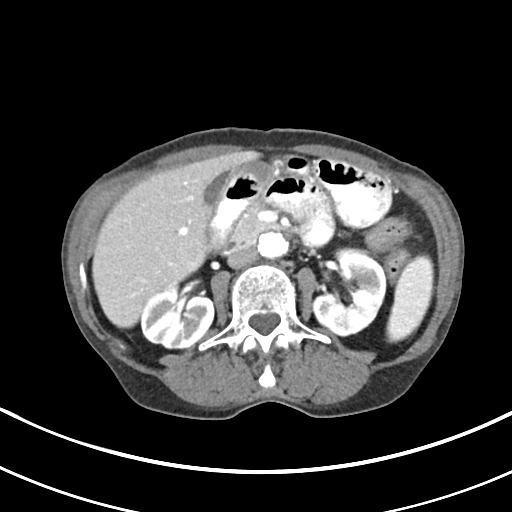
[im 58/81  bone]
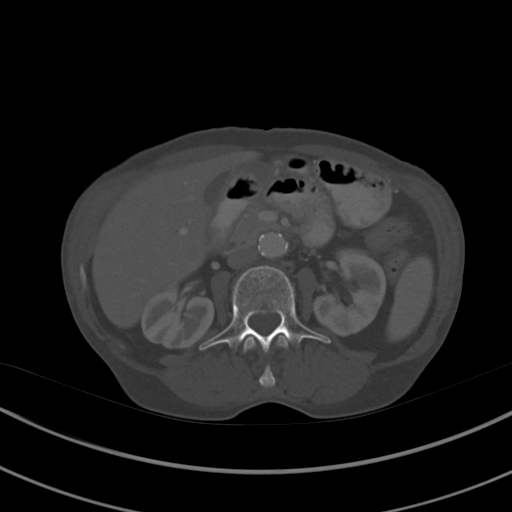
[im 63/81  soft-tissue]
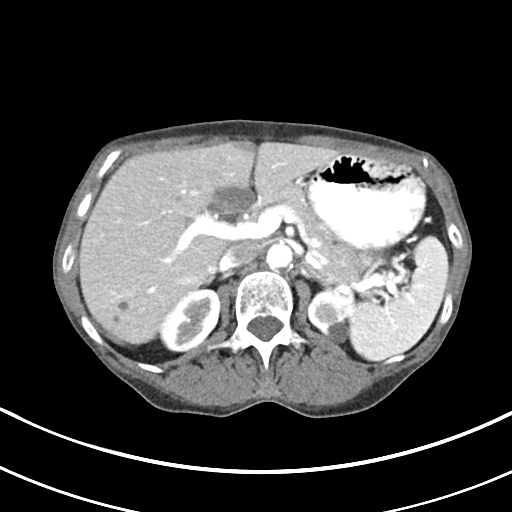
[im 67/81  soft-tissue]
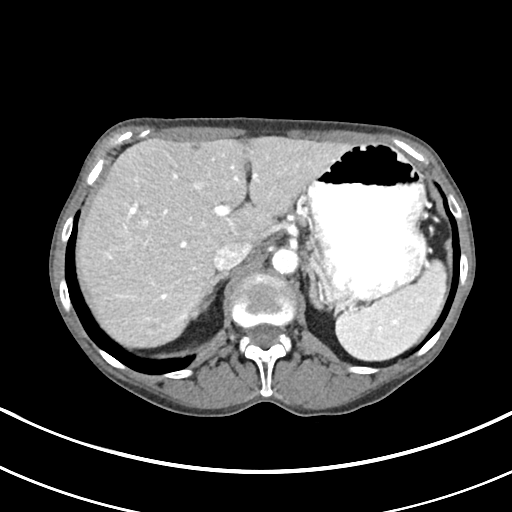
[im 76/81  soft-tissue]
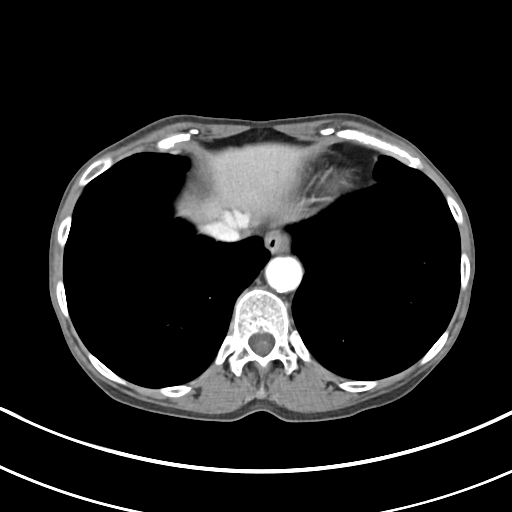

[Series 4: coronals abd pelvis 2.00 cor · coronal · 0.61mm/px · 3 of 121 slices shown]
[im 41/121  soft-tissue]
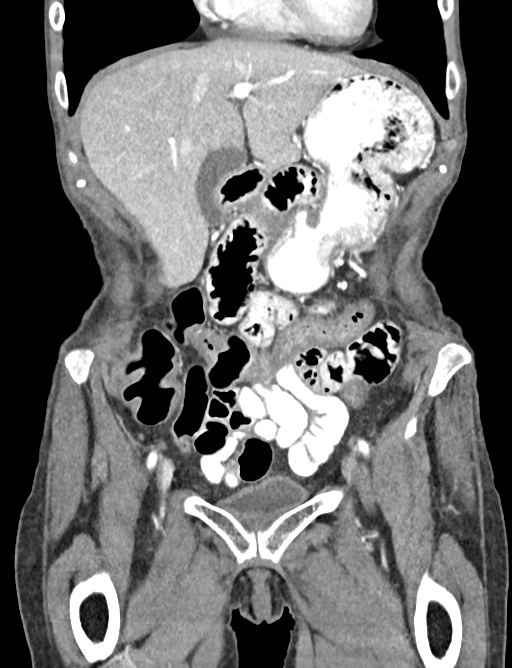
[im 54/121  soft-tissue]
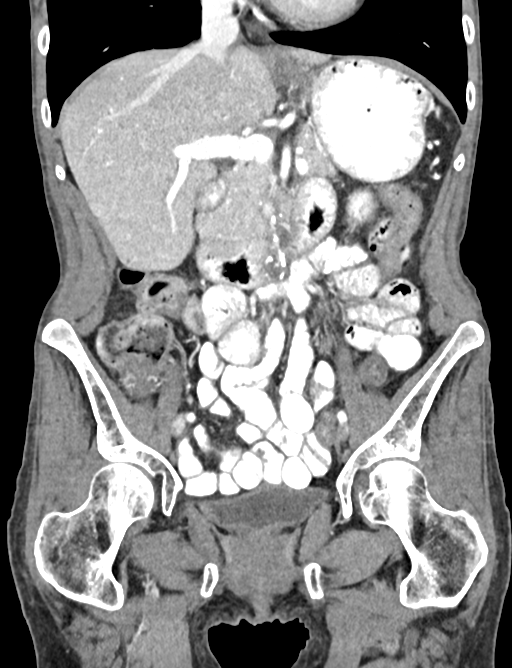
[im 67/121  soft-tissue]
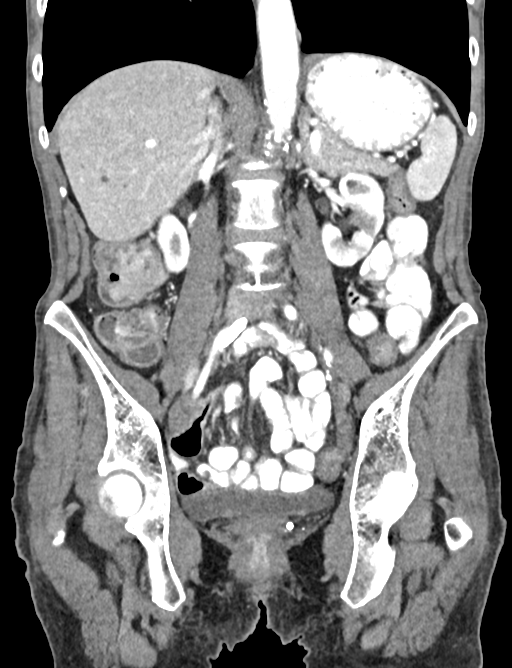

[15 of 46 positions shown; findings below may reference images not displayed]

FINDINGS: Lower chest: Unremarkable

Hepatobiliary: Small area of low attenuation in the anterior liver,
adjacent to the falciform ligament, is in a characteristic location
for focal fatty deposition. Scattered tiny hypodensities identified
in both hepatic lobes, most of which were present previously and are
likely benign. There is no evidence for gallstones, gallbladder wall
thickening, or pericholecystic fluid. No intrahepatic or
extrahepatic biliary dilation.

Pancreas: No focal mass lesion. No dilatation of the main duct. No
intraparenchymal cyst. No peripancreatic edema.

Spleen: No splenomegaly. No focal mass lesion.

Adrenals/Urinary Tract: No adrenal nodule or mass. Cortical scarring
noted in both kidneys with 4 cm posterior interpolar left renal
cyst. Other scattered smaller hypoattenuating lesions in both
kidneys cannot be definitively characterized but are likely cysts.
No evidence for hydroureter. The urinary bladder appears normal for
the degree of distention.

Stomach/Bowel: Stomach is unremarkable. No gastric wall thickening.
No evidence of outlet obstruction. Duodenum is normally positioned
as is the ligament of Treitz. No small bowel wall thickening. No
small bowel dilatation. The terminal ileum is normal. The appendix
is not visualized, but there is no edema or inflammation in the
region of the cecum. Focal area of increased attenuation is
identified along the wall of the splenic flexure (see axial image
30/series 2 and sagittal image 31 of series 6). This is probably
partially diluted oral contrast material in the right colon.
Otherwise, no gross colonic mass lesion evident.

Vascular/Lymphatic: Atherosclerotic calcification with prominent
mural thrombus noted in the abdominal aorta, measuring up to 2.8 cm
maximum diameter. There is no gastrohepatic or hepatoduodenal
ligament lymphadenopathy. No retroperitoneal or mesenteric
lymphadenopathy. No pelvic sidewall lymphadenopathy.

Reproductive: There is no adnexal mass.

Other: No intraperitoneal free fluid.

Musculoskeletal: No worrisome lytic or sclerotic osseous
abnormality.
IMPRESSION: 1. No acute findings in the abdomen or pelvis. Specifically, no
findings to explain the patient's history of nausea and vomiting.
2. Focal area of increased attenuation along the wall of the splenic
flexure. This is probably partially diluted oral contrast material
in the right colon, but correlation with colorectal cancer screening
history recommended.
3. Bilateral renal cortical scarring with 4 cm posterior interpolar
left renal cyst. Other scattered smaller hypoattenuating lesions in
both kidneys cannot be definitively characterized but are likely
cysts.
4. Aortic Atherosclerosis ([id number]-OWG.G).

## 2021-01-21 NOTE — Progress Notes (Deleted)
There were no vitals taken for this visit.   Subjective:    Patient ID: Caitlyn May, female    DOB: 06-02-1955, 65 y.o.   MRN: [id number]  HPI: Caitlyn May is a 65 y.o. female  No chief complaint on file.  COPD COPD status: {Blank single:19197::"controlled","uncontrolled","better","worse","exacerbated","stable"} Satisfied with current treatment?: {Blank single:19197::"yes","no"} Oxygen use: {Blank single:19197::"yes","no"} Dyspnea frequency:  Cough frequency:  Rescue inhaler frequency:   Limitation of activity: {Blank single:19197::"yes","no"} Productive cough:  Last Spirometry:  Pneumovax: {Blank single:19197::"Up to Date","Not up to Date","unknown"} Influenza: {Blank single:19197::"Up to Date","Not up to Date","unknown"}  PAIN  Relevant past medical, surgical, family and social history reviewed and updated as indicated. Interim medical history since our last visit reviewed. Allergies and medications reviewed and updated.  Review of Systems  Per HPI unless specifically indicated above     Objective:    There were no vitals taken for this visit.  Wt Readings from Last 3 Encounters:  12/15/20 115 lb 4 oz (52.3 kg)  10/23/20 118 lb (53.5 kg)  06/12/20 115 lb (52.2 kg)    Physical Exam  Results for orders placed or performed in visit on 12/15/20  Microscopic Examination   Urine  Result Value Ref Range   WBC, UA 0-5 0 - 5 /hpf   RBC None seen 0 - 2 /hpf   Epithelial Cells (non renal) 0-10 0 - 10 /hpf   Bacteria, UA Few None seen/Few  Comp Met (CMET)  Result Value Ref Range   Glucose 86 65 - 99 mg/dL   BUN 17 8 - 27 mg/dL   Creatinine, Ser 1.00 0.57 - 1.00 mg/dL   eGFR 62 >59 mL/min/1.73   BUN/Creatinine Ratio 17 12 - 28   Sodium 143 134 - 144 mmol/L   Potassium 3.6 3.5 - 5.2 mmol/L   Chloride 103 96 - 106 mmol/L   CO2 25 20 - 29 mmol/L   Calcium 9.2 8.7 - 10.3 mg/dL   Total Protein 7.0 6.0 - 8.5 g/dL   Albumin 4.3 3.8 - 4.8 g/dL   Globulin,  Total 2.7 1.5 - 4.5 g/dL   Albumin/Globulin Ratio 1.6 1.2 - 2.2   Bilirubin Total 0.5 0.0 - 1.2 mg/dL   Alkaline Phosphatase 71 44 - 121 IU/L   AST 15 0 - 40 IU/L   ALT 14 0 - 32 IU/L  Lipid Profile  Result Value Ref Range   Cholesterol, Total 128 100 - 199 mg/dL   Triglycerides 109 0 - 149 mg/dL   HDL 56 >39 mg/dL   VLDL Cholesterol Cal 20 5 - 40 mg/dL   LDL Chol Calc (NIH) 52 0 - 99 mg/dL   Chol/HDL Ratio 2.3 0.0 - 4.4 ratio  Vitamin D (25 hydroxy)  Result Value Ref Range   Vit D, 25-Hydroxy 37.0 30.0 - 100.0 ng/mL  CBC w/Diff  Result Value Ref Range   WBC 10.4 3.4 - 10.8 x10E3/uL   RBC 4.69 3.77 - 5.28 x10E6/uL   Hemoglobin 14.6 11.1 - 15.9 g/dL   Hematocrit 44.4 34.0 - 46.6 %   MCV 95 79 - 97 fL   MCH 31.1 26.6 - 33.0 pg   MCHC 32.9 31.5 - 35.7 g/dL   RDW 11.8 11.7 - 15.4 %   Platelets 291 150 - 450 x10E3/uL   Neutrophils 69 Not Estab. %   Lymphs 20 Not Estab. %   Monocytes 7 Not Estab. %   Eos 2 Not Estab. %   Basos 1 Not Estab. %  Neutrophils Absolute 7.3 (H) 1.4 - 7.0 x10E3/uL   Lymphocytes Absolute 2.1 0.7 - 3.1 x10E3/uL   Monocytes Absolute 0.7 0.1 - 0.9 x10E3/uL   EOS (ABSOLUTE) 0.3 0.0 - 0.4 x10E3/uL   Basophils Absolute 0.1 0.0 - 0.2 x10E3/uL   Immature Granulocytes 1 Not Estab. %   Immature Grans (Abs) 0.1 0.0 - 0.1 x10E3/uL  Urinalysis, Routine w reflex microscopic  Result Value Ref Range   Specific Gravity, UA 1.010 1.005 - 1.030   pH, UA 6.0 5.0 - 7.5   Color, UA Yellow Yellow   Appearance Ur Clear Clear   Leukocytes,UA Negative Negative   Protein,UA Trace (A) Negative/Trace   Glucose, UA Negative Negative   Ketones, UA Negative Negative   RBC, UA Negative Negative   Bilirubin, UA Negative Negative   Urobilinogen, Ur 0.2 0.2 - 1.0 mg/dL   Nitrite, UA Negative Negative   Microscopic Examination See below:       Assessment & Plan:   Problem List Items Addressed This Visit       Respiratory   COPD (chronic obstructive pulmonary disease)  (HCC)     Other   Chronic pain syndrome - Primary (Chronic)     Follow up plan: No follow-ups on file.

## 2021-01-22 ENCOUNTER — Telehealth: Payer: Self-pay

## 2021-01-22 NOTE — Telephone Encounter (Signed)
This medication was discontinued at the last visit due to patient's kidney function.

## 2021-01-22 NOTE — Telephone Encounter (Signed)
Patient notified

## 2021-01-22 NOTE — Telephone Encounter (Signed)
Incoming fax from [facility]  Requesting Meloxicam 7.'5mg'$  take 1 tablet by month daily

## 2021-02-03 ENCOUNTER — Ambulatory Visit (INDEPENDENT_AMBULATORY_CARE_PROVIDER_SITE_OTHER): Payer: Medicare HMO | Admitting: Nurse Practitioner

## 2021-02-03 ENCOUNTER — Other Ambulatory Visit: Payer: Self-pay

## 2021-02-03 ENCOUNTER — Encounter: Payer: Self-pay | Admitting: Nurse Practitioner

## 2021-02-03 VITALS — BP 92/60 | HR 75 | Temp 98.5°F | Wt 113.0 lb

## 2021-02-03 DIAGNOSIS — I712 Thoracic aortic aneurysm, without rupture, unspecified: Secondary | ICD-10-CM

## 2021-02-03 DIAGNOSIS — N182 Chronic kidney disease, stage 2 (mild): Secondary | ICD-10-CM | POA: Diagnosis not present

## 2021-02-03 MED ORDER — MEGESTROL ACETATE 40 MG PO TABS: 40.0000 mg | ORAL_TABLET | Freq: Four times a day (QID) | ORAL | 0 refills | Status: DC | PRN

## 2021-02-03 NOTE — Progress Notes (Signed)
BP 92/60   Pulse 75   Temp 98.5 F (36.9 C) (Oral)   Wt 113 lb (51.3 kg)   SpO2 97%   BMI 18.24 kg/m    Subjective:    Patient ID: Caitlyn May, female    DOB: 03/26/1956, 65 y.o.   MRN: [id number]  HPI: Caitlyn May is a 65 y.o. female  Chief Complaint  Patient presents with   Chronic Kidney Disease    Patient states she is here with questions about her diagnoses of CKD stage 2. Patient states she would like to discuss and be educated about the diagnoses.    Follow-up    Patient would like to discuss the imaging that she had done with [facility] and would like to discuss with provider. Patient was prescribed Methylprednisolone and wanted to let the provider know she was prescribed medication and has since completed the prescription.    CHRONIC KIDNEY DISEASE CKD status: controlled Medications renally dose: yes Previous renal evaluation: no Pneumovax:  Up to Date Influenza Vaccine:  Not up to Date  Patient states that she had imaging done and it showed some stenosis of her carotid.  She isn't sure when she had it done and hasn't had any follow up.  Denies HA, CP, SOB, dizziness, palpitations, visual changes, and lower extremity swelling.   Relevant past medical, surgical, family and social history reviewed and updated as indicated. Interim medical history since our last visit reviewed. Allergies and medications reviewed and updated.  Review of Systems  Eyes:  Negative for visual disturbance.  Respiratory:  Negative for cough, chest tightness and shortness of breath.   Cardiovascular:  Negative for chest pain, palpitations and leg swelling.  Neurological:  Negative for dizziness and headaches.   Per HPI unless specifically indicated above     Objective:    BP 92/60   Pulse 75   Temp 98.5 F (36.9 C) (Oral)   Wt 113 lb (51.3 kg)   SpO2 97%   BMI 18.24 kg/m   Wt Readings from Last 3 Encounters:  02/03/21 113 lb (51.3 kg)  12/15/20 115 lb 4 oz (52.3  kg)  10/23/20 118 lb (53.5 kg)    Physical Exam Vitals and nursing note reviewed.  Constitutional:      General: She is not in acute distress.    Appearance: Normal appearance. She is normal weight. She is not ill-appearing, toxic-appearing or diaphoretic.  HENT:     Head: Normocephalic.     Right Ear: External ear normal.     Left Ear: External ear normal.     Nose: Nose normal.     Mouth/Throat:     Mouth: Mucous membranes are moist.     Pharynx: Oropharynx is clear.  Eyes:     General:        Right eye: No discharge.        Left eye: No discharge.     Extraocular Movements: Extraocular movements intact.     Conjunctiva/sclera: Conjunctivae normal.     Pupils: Pupils are equal, round, and reactive to light.  Cardiovascular:     Rate and Rhythm: Normal rate and regular rhythm.     Heart sounds: No murmur heard. Pulmonary:     Effort: Pulmonary effort is normal. No respiratory distress.     Breath sounds: Normal breath sounds. No wheezing or rales.  Musculoskeletal:     Cervical back: Normal range of motion and neck supple.  Skin:    General: Skin is  warm and dry.     Capillary Refill: Capillary refill takes less than 2 seconds.  Neurological:     General: No focal deficit present.     Mental Status: She is alert and oriented to person, place, and time. Mental status is at baseline.  Psychiatric:        Mood and Affect: Mood normal.        Behavior: Behavior normal.        Thought Content: Thought content normal.        Judgment: Judgment normal.    Results for orders placed or performed in visit on 12/15/20  Microscopic Examination   Urine  Result Value Ref Range   WBC, UA 0-5 0 - 5 /hpf   RBC None seen 0 - 2 /hpf   Epithelial Cells (non renal) 0-10 0 - 10 /hpf   Bacteria, UA Few None seen/Few  Comp Met (CMET)  Result Value Ref Range   Glucose 86 65 - 99 mg/dL   BUN 17 8 - 27 mg/dL   Creatinine, Ser 1.00 0.57 - 1.00 mg/dL   eGFR 62 >59 mL/min/1.73    BUN/Creatinine Ratio 17 12 - 28   Sodium 143 134 - 144 mmol/L   Potassium 3.6 3.5 - 5.2 mmol/L   Chloride 103 96 - 106 mmol/L   CO2 25 20 - 29 mmol/L   Calcium 9.2 8.7 - 10.3 mg/dL   Total Protein 7.0 6.0 - 8.5 g/dL   Albumin 4.3 3.8 - 4.8 g/dL   Globulin, Total 2.7 1.5 - 4.5 g/dL   Albumin/Globulin Ratio 1.6 1.2 - 2.2   Bilirubin Total 0.5 0.0 - 1.2 mg/dL   Alkaline Phosphatase 71 44 - 121 IU/L   AST 15 0 - 40 IU/L   ALT 14 0 - 32 IU/L  Lipid Profile  Result Value Ref Range   Cholesterol, Total 128 100 - 199 mg/dL   Triglycerides 109 0 - 149 mg/dL   HDL 56 >39 mg/dL   VLDL Cholesterol Cal 20 5 - 40 mg/dL   LDL Chol Calc (NIH) 52 0 - 99 mg/dL   Chol/HDL Ratio 2.3 0.0 - 4.4 ratio  Vitamin D (25 hydroxy)  Result Value Ref Range   Vit D, 25-Hydroxy 37.0 30.0 - 100.0 ng/mL  CBC w/Diff  Result Value Ref Range   WBC 10.4 3.4 - 10.8 x10E3/uL   RBC 4.69 3.77 - 5.28 x10E6/uL   Hemoglobin 14.6 11.1 - 15.9 g/dL   Hematocrit 44.4 34.0 - 46.6 %   MCV 95 79 - 97 fL   MCH 31.1 26.6 - 33.0 pg   MCHC 32.9 31.5 - 35.7 g/dL   RDW 11.8 11.7 - 15.4 %   Platelets 291 150 - 450 x10E3/uL   Neutrophils 69 Not Estab. %   Lymphs 20 Not Estab. %   Monocytes 7 Not Estab. %   Eos 2 Not Estab. %   Basos 1 Not Estab. %   Neutrophils Absolute 7.3 (H) 1.4 - 7.0 x10E3/uL   Lymphocytes Absolute 2.1 0.7 - 3.1 x10E3/uL   Monocytes Absolute 0.7 0.1 - 0.9 x10E3/uL   EOS (ABSOLUTE) 0.3 0.0 - 0.4 x10E3/uL   Basophils Absolute 0.1 0.0 - 0.2 x10E3/uL   Immature Granulocytes 1 Not Estab. %   Immature Grans (Abs) 0.1 0.0 - 0.1 x10E3/uL  Urinalysis, Routine w reflex microscopic  Result Value Ref Range   Specific Gravity, UA 1.010 1.005 - 1.030   pH, UA 6.0 5.0 - 7.5  Color, UA Yellow Yellow   Appearance Ur Clear Clear   Leukocytes,UA Negative Negative   Protein,UA Trace (A) Negative/Trace   Glucose, UA Negative Negative   Ketones, UA Negative Negative   RBC, UA Negative Negative   Bilirubin, UA Negative  Negative   Urobilinogen, Ur 0.2 0.2 - 1.0 mg/dL   Nitrite, UA Negative Negative   Microscopic Examination See below:       Assessment & Plan:   Problem List Items Addressed This Visit       Genitourinary   Stage 2 chronic kidney disease    Chronic. Educated patient on avoiding NSAIDS and CKD.  All questions answered to the best of my ability.        Other Visit Diagnoses     Thoracic aortic aneurysm without rupture [facility])    -  Primary   Referral placed for patient to see Cardiology for further evaluation of Aortic Aneurysm.     Relevant Orders   Ambulatory referral to Cardiology        Follow up plan: Return in about 2 months (around 04/05/2021) for HTN, HLD, DM2 FU, weight check .

## 2021-02-03 NOTE — Assessment & Plan Note (Signed)
Chronic. Educated patient on avoiding NSAIDS and CKD.  All questions answered to the best of my ability.

## 2021-02-10 ENCOUNTER — Other Ambulatory Visit: Payer: Self-pay

## 2021-02-11 MED ORDER — CARVEDILOL 12.5 MG PO TABS: 12.5000 mg | ORAL_TABLET | Freq: Two times a day (BID) | ORAL | 0 refills | Status: DC

## 2021-02-22 ENCOUNTER — Other Ambulatory Visit: Payer: Self-pay

## 2021-02-22 DIAGNOSIS — M961 Postlaminectomy syndrome, not elsewhere classified: Secondary | ICD-10-CM

## 2021-02-22 MED ORDER — CYCLOBENZAPRINE HCL 5 MG PO TABS: ORAL_TABLET | ORAL | 0 refills | Status: DC

## 2021-03-18 ENCOUNTER — Ambulatory Visit: Payer: Self-pay | Admitting: *Deleted

## 2021-03-18 NOTE — Telephone Encounter (Signed)
I attempted to return her call.   She had called in earlier c/o sinus congestion.  I left a voicemail to call [facility] and the number any nurse would be glad to assist her.

## 2021-03-18 NOTE — Telephone Encounter (Signed)
Patient called and says she's been having sinus pain and congestion x 4 days. She says behind her eyes and the right side of her face is the worst pain at a 5. She says her nose is blocked and then it just runs. She says the drainage out of her nose was brownish colored for the past 2 days and last night it changed to yellow. She says she has run a low grade temp no higher than 100.1. She has sinus drainage down the throat as well as her right ear has a dull ache when lying on the right side. Patient advised virtual visit, appointment scheduled for tomorrow at 1620 her PCP, care advice given, patient verbalized understanding.  Summary: advice   Pt called stating she is having sinus issues, started yesterday, at first had brownish mucus and now yellow mucus when blowing her nose, pt wanted to know if she should go to UC or if there is something she could take for her sinus issues.       Reason for Disposition  Fever present > 3 days (72 hours)  Answer Assessment - Initial Assessment Questions 1. LOCATION: "Where does it hurt?"      Behind eyes, right side of face is the worst 2. ONSET: "When did the sinus pain start?"  (e.g., hours, days)      4 days ago 3. SEVERITY: "How bad is the pain?"   (Scale 1-10; mild, moderate or severe)   - MILD (1-3): doesn't interfere with normal activities    - MODERATE (4-7): interferes with normal activities (e.g., work or school) or awakens from sleep   - SEVERE (8-10): excruciating pain and patient unable to do any normal activities        5 4. RECURRENT SYMPTOM: "Have you ever had sinus problems before?" If Yes, ask: "When was the last time?" and "What happened that time?"      Yes, maybe 1 year ago, placed on antibiotics 5. NASAL CONGESTION: "Is the nose blocked?" If Yes, ask: "Can you open it or must you breathe through your mouth?"     Yes, then it runs 6. NASAL DISCHARGE: "Do you have discharge from your nose?" If so ask, "What color?"     Was a brownish  color past couple days, last night changed to yellow 7. FEVER: "Do you have a fever?" If Yes, ask: "What is it, how was it measured, and when did it start?"      Low grade 100.1 8. OTHER SYMPTOMS: "Do you have any other symptoms?" (e.g., sore throat, cough, earache, difficulty breathing)     Right earache, sinus drainage down throat 9. PREGNANCY: "Is there any chance you are pregnant?" "When was your last menstrual period?"     No  Protocols used: Sinus Pain or Congestion-A-AH

## 2021-03-18 NOTE — Telephone Encounter (Signed)
Pt called stating she is having sinus issues, started yesterday, at first had brownish mucus and now yellow mucus when blowing her nose, pt wanted to know if she should go to UC or if there is something she could take for her sinus issues.   2nd attempt to contact patient. No answer, left message on voicemail to call clinic back [phone].

## 2021-03-19 ENCOUNTER — Telehealth (INDEPENDENT_AMBULATORY_CARE_PROVIDER_SITE_OTHER): Payer: Medicare HMO | Admitting: Nurse Practitioner

## 2021-03-19 ENCOUNTER — Encounter: Payer: Self-pay | Admitting: Nurse Practitioner

## 2021-03-19 VITALS — BP 117/79 | HR 100

## 2021-03-19 DIAGNOSIS — J069 Acute upper respiratory infection, unspecified: Secondary | ICD-10-CM

## 2021-03-19 MED ORDER — PREDNISONE 10 MG PO TABS: 10.0000 mg | ORAL_TABLET | Freq: Every day | ORAL | 0 refills | Status: DC

## 2021-03-19 MED ORDER — AZITHROMYCIN 250 MG PO TABS: ORAL_TABLET | ORAL | 0 refills | Status: AC

## 2021-03-19 NOTE — Progress Notes (Signed)
BP 117/79   Pulse 100    Subjective:    Patient ID: Caitlyn May, female    DOB: 1955/07/13, 65 y.o.   MRN: [id number]  HPI: Caitlyn May is a 65 y.o. female  Chief Complaint  Patient presents with   URI    UPPER RESPIRATORY TRACT INFECTION Worst symptom: 5 days ago symptoms started Fever: yes Cough: yes Shortness of breath: no Wheezing: yes Chest pain: yes Chest tightness: yes Chest congestion: yes Nasal congestion: yes Runny nose: yes Post nasal drip: yes Sneezing: no Sore throat: yes Swollen glands: no Sinus pressure: yes Headache: yes Face pain: yes Toothache: no Ear pain: yes "right Ear pressure: yes "right Eyes red/itching:no Eye drainage/crusting: no  Vomiting: no Rash: no Fatigue: no Sick contacts: no Strep contacts: no  Context: worse Recurrent sinusitis: no Relief with OTC cold/cough medications: no  Treatments attempted:  tyelnol    Relevant past medical, surgical, family and social history reviewed and updated as indicated. Interim medical history since our last visit reviewed. Allergies and medications reviewed and updated.  Review of Systems  Constitutional:  Positive for fatigue and fever.  HENT:  Positive for congestion, ear pain, postnasal drip, rhinorrhea, sinus pressure, sinus pain and sore throat. Negative for dental problem and sneezing.   Respiratory:  Positive for cough, shortness of breath and wheezing.   Cardiovascular:  Positive for chest pain.  Gastrointestinal:  Negative for vomiting.  Skin:  Negative for rash.  Neurological:  Positive for headaches.   Per HPI unless specifically indicated above     Objective:    BP 117/79   Pulse 100   Wt Readings from Last 3 Encounters:  02/03/21 113 lb (51.3 kg)  12/15/20 115 lb 4 oz (52.3 kg)  10/23/20 118 lb (53.5 kg)    Physical Exam Vitals and nursing note reviewed.  HENT:     Head: Normocephalic.     Right Ear: Hearing normal.     Left Ear: Hearing normal.      Nose: Nose normal.  Eyes:     Pupils: Pupils are equal, round, and reactive to light.  Pulmonary:     Effort: Pulmonary effort is normal. No respiratory distress.  Neurological:     Mental Status: She is alert.  Psychiatric:        Mood and Affect: Mood normal.        Behavior: Behavior normal.        Thought Content: Thought content normal.        Judgment: Judgment normal.    Results for orders placed or performed in visit on 12/15/20  Microscopic Examination   Urine  Result Value Ref Range   WBC, UA 0-5 0 - 5 /hpf   RBC None seen 0 - 2 /hpf   Epithelial Cells (non renal) 0-10 0 - 10 /hpf   Bacteria, UA Few None seen/Few  Comp Met (CMET)  Result Value Ref Range   Glucose 86 65 - 99 mg/dL   BUN 17 8 - 27 mg/dL   Creatinine, Ser 1.00 0.57 - 1.00 mg/dL   eGFR 62 >59 mL/min/1.73   BUN/Creatinine Ratio 17 12 - 28   Sodium 143 134 - 144 mmol/L   Potassium 3.6 3.5 - 5.2 mmol/L   Chloride 103 96 - 106 mmol/L   CO2 25 20 - 29 mmol/L   Calcium 9.2 8.7 - 10.3 mg/dL   Total Protein 7.0 6.0 - 8.5 g/dL   Albumin 4.3 3.8 - 4.8 g/dL  Globulin, Total 2.7 1.5 - 4.5 g/dL   Albumin/Globulin Ratio 1.6 1.2 - 2.2   Bilirubin Total 0.5 0.0 - 1.2 mg/dL   Alkaline Phosphatase 71 44 - 121 IU/L   AST 15 0 - 40 IU/L   ALT 14 0 - 32 IU/L  Lipid Profile  Result Value Ref Range   Cholesterol, Total 128 100 - 199 mg/dL   Triglycerides 109 0 - 149 mg/dL   HDL 56 >39 mg/dL   VLDL Cholesterol Cal 20 5 - 40 mg/dL   LDL Chol Calc (NIH) 52 0 - 99 mg/dL   Chol/HDL Ratio 2.3 0.0 - 4.4 ratio  Vitamin D (25 hydroxy)  Result Value Ref Range   Vit D, 25-Hydroxy 37.0 30.0 - 100.0 ng/mL  CBC w/Diff  Result Value Ref Range   WBC 10.4 3.4 - 10.8 x10E3/uL   RBC 4.69 3.77 - 5.28 x10E6/uL   Hemoglobin 14.6 11.1 - 15.9 g/dL   Hematocrit 44.4 34.0 - 46.6 %   MCV 95 79 - 97 fL   MCH 31.1 26.6 - 33.0 pg   MCHC 32.9 31.5 - 35.7 g/dL   RDW 11.8 11.7 - 15.4 %   Platelets 291 150 - 450 x10E3/uL   Neutrophils  69 Not Estab. %   Lymphs 20 Not Estab. %   Monocytes 7 Not Estab. %   Eos 2 Not Estab. %   Basos 1 Not Estab. %   Neutrophils Absolute 7.3 (H) 1.4 - 7.0 x10E3/uL   Lymphocytes Absolute 2.1 0.7 - 3.1 x10E3/uL   Monocytes Absolute 0.7 0.1 - 0.9 x10E3/uL   EOS (ABSOLUTE) 0.3 0.0 - 0.4 x10E3/uL   Basophils Absolute 0.1 0.0 - 0.2 x10E3/uL   Immature Granulocytes 1 Not Estab. %   Immature Grans (Abs) 0.1 0.0 - 0.1 x10E3/uL  Urinalysis, Routine w reflex microscopic  Result Value Ref Range   Specific Gravity, UA 1.010 1.005 - 1.030   pH, UA 6.0 5.0 - 7.5   Color, UA Yellow Yellow   Appearance Ur Clear Clear   Leukocytes,UA Negative Negative   Protein,UA Trace (A) Negative/Trace   Glucose, UA Negative Negative   Ketones, UA Negative Negative   RBC, UA Negative Negative   Bilirubin, UA Negative Negative   Urobilinogen, Ur 0.2 0.2 - 1.0 mg/dL   Nitrite, UA Negative Negative   Microscopic Examination See below:       Assessment & Plan:   Problem List Items Addressed This Visit   None Visit Diagnoses     Viral upper respiratory tract infection    -  Primary   Zpak & prednisone sent to the pharmacy. Discussed with patient how to use them. If symptoms do not improve will order chest xray. FU is symptoms do not improve.   Relevant Medications   azithromycin (ZITHROMAX) 250 MG tablet        Follow up plan: Return if symptoms worsen or fail to improve.   This visit was completed via MyChart due to the restrictions of the COVID-19 pandemic. All issues as above were discussed and addressed. Physical exam was done as above through visual confirmation on MyChart. If it was felt that the patient should be evaluated in the office, they were directed there. The patient verbally consented to this visit. Location of the patient: Home Location of the provider: Office Those involved with this call:  Provider: Jon Billings, NP CMA: Irena Reichmann, [facility] [facility] Desk/Registration: Myrlene Broker This encounter was conducted via video.  I spent  15 dedicated to the care of this patient on the date of this encounter to include previsit review of 20, face to face time with the patient, and post visit ordering of testing.

## 2021-03-26 ENCOUNTER — Other Ambulatory Visit: Payer: Self-pay

## 2021-03-26 MED ORDER — CARVEDILOL 12.5 MG PO TABS: 12.5000 mg | ORAL_TABLET | Freq: Two times a day (BID) | ORAL | 0 refills | Status: DC

## 2021-03-26 NOTE — Telephone Encounter (Signed)
Patient last seen in September and has appointment in November.

## 2021-04-06 NOTE — Progress Notes (Deleted)
There were no vitals taken for this visit.   Subjective:    Patient ID: Caitlyn May, female    DOB: 01/21/1956, 65 y.o.   MRN: [id number]  HPI: Caitlyn May is a 64 y.o. female  No chief complaint on file.  HYPERTENSION / HYPERLIPIDEMIA Satisfied with current treatment? yes Duration of hypertension: years BP monitoring frequency: daily BP range: 120/80 BP medication side effects: no Past BP meds: amlodipine, benazepril, and carvedilol Duration of hyperlipidemia: years Cholesterol medication side effects: no Cholesterol supplements: none Past cholesterol medications: atorvastain (lipitor) Medication compliance: excellent compliance Aspirin: no Recent stressors: no Recurrent headaches: no Visual changes: no Palpitations: no Dyspnea: yes Chest pain: no Lower extremity edema: no Dizzy/lightheaded: no  COPD Down to 1.5-1 ppd which is down from 2ppd. COPD status: uncontrolled Satisfied with current treatment?: no Oxygen use: no Dyspnea frequency: all day everyday Cough frequency: comes and goes throughout the day Rescue inhaler frequency:  everyday 1-2 times per day Limitation of activity: yes Productive cough: yes- states it is very thick Last Spirometry:  Pneumovax: Up to Date Influenza: Up to Date  DEPRESSION/ANXIETY Patient states she is well controlled at times.  Does not have any thoughts of self harm or SI.  She is sad that she doesn't have any family members left. She feels like if her arthritis and her breathing were better controlled than she would be happier.   CHRONIC PAIN Patient states she is constantly in chronic pain. She would like to see someone who will help her with the arthritis.    Relevant past medical, surgical, family and social history reviewed and updated as indicated. Interim medical history since our last visit reviewed. Allergies and medications reviewed and updated.  Review of Systems  Eyes:  Negative for visual  disturbance.  Respiratory:  Positive for cough and shortness of breath. Negative for chest tightness.   Cardiovascular:  Negative for chest pain, palpitations and leg swelling.  Neurological:  Negative for dizziness and headaches.   Per HPI unless specifically indicated above     Objective:    There were no vitals taken for this visit.  Wt Readings from Last 3 Encounters:  02/03/21 113 lb (51.3 kg)  12/15/20 115 lb 4 oz (52.3 kg)  10/23/20 118 lb (53.5 kg)    Physical Exam Vitals and nursing note reviewed.  Constitutional:      General: She is not in acute distress.    Appearance: Normal appearance. She is normal weight. She is not ill-appearing, toxic-appearing or diaphoretic.  HENT:     Head: Normocephalic.     Right Ear: External ear normal.     Left Ear: External ear normal.     Nose: Nose normal.     Mouth/Throat:     Mouth: Mucous membranes are moist.     Pharynx: Oropharynx is clear.  Eyes:     General:        Right eye: No discharge.        Left eye: No discharge.     Extraocular Movements: Extraocular movements intact.     Conjunctiva/sclera: Conjunctivae normal.     Pupils: Pupils are equal, round, and reactive to light.  Cardiovascular:     Rate and Rhythm: Normal rate and regular rhythm.     Heart sounds: No murmur heard. Pulmonary:     Effort: Pulmonary effort is normal. No respiratory distress.     Breath sounds: Wheezing present. No rales.  Musculoskeletal:     Cervical back:  Normal range of motion and neck supple.  Skin:    General: Skin is warm and dry.     Capillary Refill: Capillary refill takes less than 2 seconds.  Neurological:     General: No focal deficit present.     Mental Status: She is alert and oriented to person, place, and time. Mental status is at baseline.  Psychiatric:        Mood and Affect: Mood normal.        Behavior: Behavior normal.        Thought Content: Thought content normal.        Judgment: Judgment normal.     Results for orders placed or performed in visit on 12/15/20  Microscopic Examination   Urine  Result Value Ref Range   WBC, UA 0-5 0 - 5 /hpf   RBC None seen 0 - 2 /hpf   Epithelial Cells (non renal) 0-10 0 - 10 /hpf   Bacteria, UA Few None seen/Few  Comp Met (CMET)  Result Value Ref Range   Glucose 86 65 - 99 mg/dL   BUN 17 8 - 27 mg/dL   Creatinine, Ser 1.00 0.57 - 1.00 mg/dL   eGFR 62 >59 mL/min/1.73   BUN/Creatinine Ratio 17 12 - 28   Sodium 143 134 - 144 mmol/L   Potassium 3.6 3.5 - 5.2 mmol/L   Chloride 103 96 - 106 mmol/L   CO2 25 20 - 29 mmol/L   Calcium 9.2 8.7 - 10.3 mg/dL   Total Protein 7.0 6.0 - 8.5 g/dL   Albumin 4.3 3.8 - 4.8 g/dL   Globulin, Total 2.7 1.5 - 4.5 g/dL   Albumin/Globulin Ratio 1.6 1.2 - 2.2   Bilirubin Total 0.5 0.0 - 1.2 mg/dL   Alkaline Phosphatase 71 44 - 121 IU/L   AST 15 0 - 40 IU/L   ALT 14 0 - 32 IU/L  Lipid Profile  Result Value Ref Range   Cholesterol, Total 128 100 - 199 mg/dL   Triglycerides 109 0 - 149 mg/dL   HDL 56 >39 mg/dL   VLDL Cholesterol Cal 20 5 - 40 mg/dL   LDL Chol Calc (NIH) 52 0 - 99 mg/dL   Chol/HDL Ratio 2.3 0.0 - 4.4 ratio  Vitamin D (25 hydroxy)  Result Value Ref Range   Vit D, 25-Hydroxy 37.0 30.0 - 100.0 ng/mL  CBC w/Diff  Result Value Ref Range   WBC 10.4 3.4 - 10.8 x10E3/uL   RBC 4.69 3.77 - 5.28 x10E6/uL   Hemoglobin 14.6 11.1 - 15.9 g/dL   Hematocrit 44.4 34.0 - 46.6 %   MCV 95 79 - 97 fL   MCH 31.1 26.6 - 33.0 pg   MCHC 32.9 31.5 - 35.7 g/dL   RDW 11.8 11.7 - 15.4 %   Platelets 291 150 - 450 x10E3/uL   Neutrophils 69 Not Estab. %   Lymphs 20 Not Estab. %   Monocytes 7 Not Estab. %   Eos 2 Not Estab. %   Basos 1 Not Estab. %   Neutrophils Absolute 7.3 (H) 1.4 - 7.0 x10E3/uL   Lymphocytes Absolute 2.1 0.7 - 3.1 x10E3/uL   Monocytes Absolute 0.7 0.1 - 0.9 x10E3/uL   EOS (ABSOLUTE) 0.3 0.0 - 0.4 x10E3/uL   Basophils Absolute 0.1 0.0 - 0.2 x10E3/uL   Immature Granulocytes 1 Not Estab. %    Immature Grans (Abs) 0.1 0.0 - 0.1 x10E3/uL  Urinalysis, Routine w reflex microscopic  Result Value Ref Range  Specific Gravity, UA 1.010 1.005 - 1.030   pH, UA 6.0 5.0 - 7.5   Color, UA Yellow Yellow   Appearance Ur Clear Clear   Leukocytes,UA Negative Negative   Protein,UA Trace (A) Negative/Trace   Glucose, UA Negative Negative   Ketones, UA Negative Negative   RBC, UA Negative Negative   Bilirubin, UA Negative Negative   Urobilinogen, Ur 0.2 0.2 - 1.0 mg/dL   Nitrite, UA Negative Negative   Microscopic Examination See below:       Assessment & Plan:   Problem List Items Addressed This Visit      Cardiovascular and Mediastinum   Hypertension - Primary   Aortic atherosclerosis (HCC)     Respiratory   COPD (chronic obstructive pulmonary disease) (HCC)     Genitourinary   Stage 2 chronic kidney disease     Other   Depression, recurrent (HCC)   Weight loss     Follow up plan: No follow-ups on file.

## 2021-04-07 ENCOUNTER — Ambulatory Visit: Payer: Medicare HMO | Admitting: Nurse Practitioner

## 2021-04-07 DIAGNOSIS — R634 Abnormal weight loss: Secondary | ICD-10-CM

## 2021-04-07 DIAGNOSIS — I10 Essential (primary) hypertension: Secondary | ICD-10-CM

## 2021-04-07 DIAGNOSIS — I700 Atherosclerosis of aorta: Secondary | ICD-10-CM

## 2021-04-07 DIAGNOSIS — N182 Chronic kidney disease, stage 2 (mild): Secondary | ICD-10-CM

## 2021-04-07 DIAGNOSIS — J449 Chronic obstructive pulmonary disease, unspecified: Secondary | ICD-10-CM

## 2021-04-07 DIAGNOSIS — F339 Major depressive disorder, recurrent, unspecified: Secondary | ICD-10-CM

## 2021-04-11 NOTE — Progress Notes (Signed)
Cardiology Office Note    Date:  04/12/2021   ID:  Caitlyn May, DOB [birth year], MRN [id number]  PCP:  Jon Billings, NP  Cardiologist:  Kate Sable, MD  Electrophysiologist:  None   Chief Complaint: Evaluation of ectatic thoracic ascending aorta  History of Present Illness:   Caitlyn May is a 65 y.o. female with history of syncope presumed to be vasovagal, ectatic ascending aorta measuring 3.4 cm by CT in 07/2019, HTN, HLD, emphysema, tobacco use, panic attacks, and chronic neck and back pain on narcotic pain medication who presents for evaluation of ectatic ascending aorta at the request of her PCP.  She was previously evaluated by Dr. 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  Episodes were felt to be possibly neurocardiogenic along with possible significant fluctuation in her blood pressure.  There were no ischemic EKG changes.  Echo at that time demonstrated an EF of 60 to 65%, normal wall motion, normal LV diastolic function parameters, normal-sized left atrium, normal RV systolic function, normal PASP, and normal-sized aortic root.  Carotid artery ultrasound showed only mild plaque bilaterally.  CT chest with contrast performed in 07/2019, for dyspnea, unintentional weight loss, and right-sided chest discomfort showed no acute cardiopulmonary process, mass, or adenopathy.  There was mild ectasia of the ascending thoracic aorta measuring 3.4 cm, along with emphysema, aortic atherosclerosis, and hypodensities within the liver that were too small to characterize as well as unchanged renal cysts.  She was lost to follow-up until being evaluated by Dr. Garen Lah in 05/2020 for hypertension.  Phone note from 09/2020 noting a syncopal episode during a panic attack.  She comes in today at the request of her PCP for follow up of the previously noted ectatic ascending thoracic aorta.   She  comes in doing well from a cardiac perspective.  No chest pain, dyspnea, or palpitations.  She does continue to have intermittent syncopal episodes, typically associated with increased stress and panic attacks.  No associated palpitations, dizziness, dyspnea, diaphoresis, or nausea with her syncopal episodes.  No loss of bowel or bladder function.  No seizure-like activity.  She reports prior extensive neuro work-up for these events, which is unavailable for review at this time.  Because of these events, she tries to no longer drive.  She does continue to smoke cigarettes, though has tapered down to 1/2 pack to 1 pack/day, previously at 2 packs/day and has smoked since age 26.  She is tolerating medications without issues.   Labs independently reviewed: 11/2020 - Hgb 14.6, PLT 291, TC 128, TG 109, HDL 56, LDL 52, BUN 17, serum creatinine 1.0, potassium 3.6, albumin 4.3, AST/ALT normal 02/2020 - TSH normal  Past Medical History:  Diagnosis Date   Acute rhinosinusitis 07/23/2019   Chronic kidney disease    Depression    Failed back syndrome, lumbar    Hyperlipidemia    Hypertension    Osteoporosis    Weight loss     Past Surgical History:  Procedure Laterality Date   ABDOMINAL HYSTERECTOMY     CESAREAN SECTION     MOUTH SURGERY     SPINE SURGERY      Current Medications: Current Meds  Medication Sig   albuterol (VENTOLIN HFA) 108 (90 Base) MCG/ACT inhaler Inhale 2 puffs into the lungs every 6 (six) hours as needed for wheezing or shortness of breath.   amLODipine (NORVASC) 5 MG tablet Take 1 tablet (5  mg total) by mouth daily.   atorvastatin (LIPITOR) 80 MG tablet Take 1 tablet (80 mg total) by mouth daily.   benazepril (LOTENSIN) 40 MG tablet Take 1 tablet (40 mg total) by mouth daily.   carvedilol (COREG) 12.5 MG tablet Take 1 tablet (12.5 mg total) by mouth 2 (two) times daily.   cyclobenzaprine (FLEXERIL) 5 MG tablet Take 1 tab once daily as needed for muscle spasm.   gabapentin  (NEURONTIN) 300 MG capsule Take 1 capsule (300 mg total) by mouth 3 (three) times daily.   megestrol (MEGACE) 40 MG tablet Take 1 tablet (40 mg total) by mouth 4 (four) times daily as needed.   montelukast (SINGULAIR) 10 MG tablet TAKE ONE TABLET BY MOUTH AT BEDTIME   ondansetron (ZOFRAN ODT) 8 MG disintegrating tablet Take 1 tablet (8 mg total) by mouth every 8 (eight) hours as needed for nausea or vomiting.   pantoprazole (PROTONIX) 40 MG tablet Take 1 tablet by mouth at bedtime.   sertraline (ZOLOFT) 100 MG tablet Take 1.5 tablets (150 mg total) by mouth daily.   Tiotropium Bromide-Olodaterol (STIOLTO RESPIMAT) 2.5-2.5 MCG/ACT AERS Inhale 2 puffs into the lungs daily.   [DISCONTINUED] amLODipine (NORVASC) 10 MG tablet Take 1 tablet (10 mg total) by mouth daily.    Allergies:   Accupril [quinapril hcl], Avelox [moxifloxacin], Ciprofloxacin, Hctz [hydrochlorothiazide], Penicillins, and Sulfa antibiotics   Social History   Socioeconomic History   Marital status: Married    Spouse name: Not on file   Number of children: Not on file   Years of education: 10   Highest education level: 10th grade  Occupational History   Occupation: disability  Tobacco Use   Smoking status: Every Day    Packs/day: 1.00    Types: Cigarettes   Smokeless tobacco: Never  Vaping Use   Vaping Use: Never used  Substance and Sexual Activity   Alcohol use: No   Drug use: Not Currently    Types: Marijuana    Comment: last week estimated 05/10/2019   Sexual activity: Not on file  Other Topics Concern   Not on file  Social History Narrative   Not on file   Social Determinants of Health   Financial Resource Strain: Low Risk    Difficulty of Paying Living Expenses: Not very hard  Food Insecurity: No Food Insecurity   Worried About Charity fundraiser in the Last Year: Never true   Ran Out of Food in the Last Year: Never true  Transportation Needs: No Transportation Needs   Lack of Transportation  (Medical): No   Lack of Transportation (Non-Medical): No  Physical Activity: Inactive   Days of Exercise per Week: 0 days   Minutes of Exercise per Session: 0 min  Stress: No Stress Concern Present   Feeling of Stress : Not at all  Social Connections: Not on file     Family History:  The patient's family history includes Cancer in her father and maternal aunt; Dementia in her mother; Hypertension in her mother. There is no history of Diabetes, Heart disease, Stroke, or COPD.  ROS:   Review of Systems  Constitutional:  Positive for malaise/fatigue. Negative for chills, diaphoresis, fever and weight loss.  HENT:  Negative for congestion.   Eyes:  Negative for discharge and redness.  Respiratory:  Negative for cough, sputum production, shortness of breath and wheezing.   Cardiovascular:  Negative for chest pain, palpitations, orthopnea, claudication, leg swelling and PND.  Gastrointestinal:  Negative for abdominal  pain, heartburn, nausea and vomiting.  Musculoskeletal:  Positive for falls. Negative for myalgias.  Skin:  Negative for rash.  Neurological:  Positive for loss of consciousness. Negative for dizziness, tingling, tremors, sensory change, speech change, focal weakness and weakness.  Endo/Heme/Allergies:  Does not bruise/bleed easily.  Psychiatric/Behavioral:  Negative for substance abuse. The patient is not nervous/anxious.   All other systems reviewed and are negative.   EKGs/Labs/Other Studies Reviewed:    Studies reviewed were summarized above. The additional studies were reviewed today:  2D echo 01/12/2017: - Left ventricle: The cavity size was normal. Systolic function was    normal. The estimated ejection fraction was in the range of 60%    to 65%. Wall motion was normal; there were no regional wall    motion abnormalities. Left ventricular diastolic function    parameters were normal.  - Left atrium: The atrium was normal in size.  - Right ventricle: Systolic  function was normal.  - Pulmonary arteries: Systolic pressure was within the normal    range.  - Normal size aortic root. __________  CT chest with contrast 08/15/2019: IMPRESSION: 1.  No acute cardiopulmonary disease.  No focal mass or adenopathy. 2. Aortic Atherosclerosis (ICD10-I70.0) and Emphysema (ICD10-J43.9). Atherosclerotic coronary artery disease. 3. Mild ectasia of the ascending thoracic aorta measuring 3.4 cm. Recommend annual imaging followup by CTA or MRA. This recommendation follows 2010 ACCF/AHA/AATS/ACR/ASA/SCA/SCAI/SIR/STS/SVM Guidelines for the Diagnosis and Management of Patients with Thoracic Aortic Disease. Circulation.2010; 121: G956-O130. Aortic aneurysm NOS (ICD10-I71.9). 4. Multiple subcentimeter liver hypodensities too small to characterize but likely cysts. Several cysts over the upper pole of the kidneys unchanged.   EKG:  EKG is ordered today.  The EKG ordered today demonstrates NSR, 75 bpm, baseline artifact, no acute st/t changes   Recent Labs: 12/15/2020: ALT 14; BUN 17; Creatinine, Ser 1.00; Hemoglobin 14.6; Platelets 291; Potassium 3.6; Sodium 143  Recent Lipid Panel    Component Value Date/Time   CHOL 128 12/15/2020 1039   CHOL 163 12/11/2017 0934   TRIG 109 12/15/2020 1039   TRIG 167 (H) 12/11/2017 0934   HDL 56 12/15/2020 1039   CHOLHDL 2.3 12/15/2020 1039   VLDL 33 (H) 12/11/2017 0934   LDLCALC 52 12/15/2020 1039    PHYSICAL EXAM:    VS:  BP 114/70   Pulse 81   Ht 5\' 6"  (1.676 m)   Wt 117 lb (53.1 kg)   SpO2 98%   BMI 18.88 kg/m   BMI: Body mass index is 18.88 kg/m.  Physical Exam Vitals reviewed.  Constitutional:      Appearance: She is well-developed.  HENT:     Head: Normocephalic and atraumatic.  Eyes:     General:        Right eye: No discharge.        Left eye: No discharge.  Neck:     Vascular: No JVD.  Cardiovascular:     Rate and Rhythm: Normal rate and regular rhythm.     Pulses:          Posterior tibial  pulses are 2+ on the right side and 2+ on the left side.     Heart sounds: Normal heart sounds, S1 normal and S2 normal. Heart sounds not distant. No midsystolic click and no opening snap. No murmur heard.   No friction rub.  Pulmonary:     Effort: Pulmonary effort is normal. No respiratory distress.     Breath sounds: Normal breath sounds. No decreased breath sounds,  wheezing or rales.  Chest:     Chest wall: No tenderness.  Abdominal:     General: There is no distension.     Palpations: Abdomen is soft.     Tenderness: There is no abdominal tenderness.  Musculoskeletal:     Cervical back: Normal range of motion.     Right lower leg: No edema.     Left lower leg: No edema.  Skin:    General: Skin is warm and dry.     Nails: There is no clubbing.  Neurological:     Mental Status: She is alert and oriented to person, place, and time.  Psychiatric:        Speech: Speech normal.        Behavior: Behavior normal.        Thought Content: Thought content normal.        Judgment: Judgment normal.    Wt Readings from Last 3 Encounters:  04/12/21 117 lb (53.1 kg)  02/03/21 113 lb (51.3 kg)  12/15/20 115 lb 4 oz (52.3 kg)     ASSESSMENT & PLAN:   Ectatic ascending thoracic aorta: Prior CT of the chest in 07/2019 demonstrated an ectatic ascending aorta measuring 3.4 cm with recommendation for annual follow-up imaging.  Schedule CTA aorta to further define her aortic diameter.  Recommend risk factor modification including complete smoking cessation, and continued optimal control of blood pressure and cholesterol.  We will update echo as well in an effort to obtain better images of her aortic valve to evaluate if this is trileaflet or bicuspid.  Diagnosis explained in detail with the patient.  History of syncope: Previously felt to be related to neurocardiogenic versus vasovagal etiology.  Prior echo showed no significant structural abnormalities with carotid artery ultrasound demonstrating  no significant atherosclerotic disease bilaterally.  She continues to note intermittent syncopal episodes, these are frequently in the context of increased stress/panic attacks.  Place Zio patch.  In follow-up, recommend Lexiscan MPI or coronary CTA.  Repeat echo and carotid artery ultrasound given recurrent episodes of syncope.  [place] driving restrictions were discussed in detail.  She should abstain from driving for 6 months dated back to last syncopal episode, or until an identifiable causes found and adequately treated.  HTN: Blood pressure is soft in the office today.  Decrease amlodipine to 5 mg daily.  She will otherwise continue benazepril and carvedilol at current doses.  Low-sodium diet is encouraged.  HLD: LDL 52 in 11/2020.  She remains on atorvastatin 80 mg which is followed by her PCP.  Emphysema with ongoing tobacco use: No acute exacerbation.  Noted on CT of the chest.  Complete cessation of tobacco use is recommended.  She will initially try this with the addition of nicotine lozenges with tapering of cigarettes.  Abnormal CT findings: CT of the chest demonstrated liver hypodensities that were too small to characterize along with stable renal cysts.  Recommend follow-up with PCP to determine if further imaging is indicated.   Disposition: F/u with Dr. Garen Lah or an APP in 2 months.   Medication Adjustments/Labs and Tests Ordered: Current medicines are reviewed at length with the patient today.  Concerns regarding medicines are outlined above. Medication changes, Labs and Tests ordered today are summarized above and listed in the Patient Instructions accessible in Encounters.   Signed, Christell Faith, PA-C 04/12/2021 12:53 PM     [place] [address] [place], [place] [address] [phone]

## 2021-04-12 ENCOUNTER — Encounter: Payer: Self-pay | Admitting: Physician Assistant

## 2021-04-12 ENCOUNTER — Other Ambulatory Visit: Payer: Self-pay

## 2021-04-12 ENCOUNTER — Telehealth: Payer: Self-pay | Admitting: Physician Assistant

## 2021-04-12 ENCOUNTER — Ambulatory Visit: Payer: Medicare HMO | Admitting: Physician Assistant

## 2021-04-12 ENCOUNTER — Ambulatory Visit: Payer: Medicare HMO

## 2021-04-12 VITALS — BP 114/70 | HR 81 | Ht 66.0 in | Wt 117.0 lb

## 2021-04-12 DIAGNOSIS — F17200 Nicotine dependence, unspecified, uncomplicated: Secondary | ICD-10-CM

## 2021-04-12 DIAGNOSIS — R55 Syncope and collapse: Secondary | ICD-10-CM

## 2021-04-12 DIAGNOSIS — J449 Chronic obstructive pulmonary disease, unspecified: Secondary | ICD-10-CM | POA: Diagnosis not present

## 2021-04-12 DIAGNOSIS — E785 Hyperlipidemia, unspecified: Secondary | ICD-10-CM | POA: Diagnosis not present

## 2021-04-12 DIAGNOSIS — I10 Essential (primary) hypertension: Secondary | ICD-10-CM | POA: Diagnosis not present

## 2021-04-12 DIAGNOSIS — R9389 Abnormal findings on diagnostic imaging of other specified body structures: Secondary | ICD-10-CM | POA: Diagnosis not present

## 2021-04-12 DIAGNOSIS — I77819 Aortic ectasia, unspecified site: Secondary | ICD-10-CM | POA: Diagnosis not present

## 2021-04-12 MED ORDER — AMLODIPINE BESYLATE 5 MG PO TABS: 5.0000 mg | ORAL_TABLET | Freq: Every day | ORAL | 3 refills | Status: DC

## 2021-04-12 NOTE — Telephone Encounter (Signed)
Left voicemail message for patient to call back due to incorrect number provided for scheduling her CT. Requested she call back and ask for [facility].

## 2021-04-12 NOTE — Patient Instructions (Addendum)
Medication Instructions:  Your physician has recommended you make the following change in your medication:   DECREASE Amlodipine to 5 mg once a day  *If you need a refill on your cardiac medications before your next appointment, please call your pharmacy*   Lab Work: None  If you have labs (blood work) drawn today and your tests are completely normal, you will receive your results only by: [place] (if you have MyChart) OR A paper copy in the mail If you have any lab test that is abnormal or we need to change your treatment, we will call you to review the results.   Testing/Procedures: Your physician has requested that you have an echocardiogram.   Echocardiography is a painless test that uses sound waves to create images of your heart. It provides your doctor with information about the size and shape of your heart and how well your heart's chambers and valves are working. This procedure takes approximately one hour. There are no restrictions for this procedure.  Your physician has requested that you have a carotid duplex.   This test is an ultrasound of the carotid arteries in your neck. It looks at blood flow through these arteries that supply the brain with blood. Allow one hour for this exam. There are no restrictions or special instructions.  CTA Aorta. Please call [phone]  Non-Cardiac CT Angiography (CTA), is a special type of CT scan that uses a computer to produce multi-dimensional views of major blood vessels throughout the body. In CT angiography, a contrast material is injected through an IV to help visualize the blood vessels  Your physician has recommended that you wear a Zio monitor. This monitor will be mailed to your home with instructions. We would like for you to wear the monitor for 2 weeks. When you remove monitor then return in box provided.   This monitor is a medical device that records the heart's electrical activity. Doctors most often use these  monitors to diagnose arrhythmias. Arrhythmias are problems with the speed or rhythm of the heartbeat. The monitor is a small device applied to your chest. You can wear one while you do your normal daily activities. While wearing this monitor if you have any symptoms to push the button and record what you felt. Once you have worn this monitor for the period of time provider prescribed (Usually 14 days), you will return the monitor device in the postage paid box. Once it is returned they will download the data collected and provide [place] with a report which the provider will then review and we will call you with those results. Important tips:  Avoid showering during the first 24 hours of wearing the monitor. Avoid excessive sweating to help maximize wear time. Do not submerge the device, no hot tubs, and no swimming pools. Keep any lotions or oils away from the patch. After 24 hours you may shower with the patch on. Take brief showers with your back facing the shower head.  Do not remove patch once it has been placed because that will interrupt data and decrease adhesive wear time. Push the button when you have any symptoms and write down what you were feeling. Once you have completed wearing your monitor, remove and place into box which has postage paid and place in your outgoing mailbox.  If for some reason you have misplaced your box then call our office and we can provide another box and/or mail it off for you.     Follow-Up: At  [facility], you and your health needs are our priority.  As part of our continuing mission to provide you with exceptional heart care, we have created designated Provider Care Teams.  These Care Teams include your primary Cardiologist (physician) and Advanced Practice Providers (APPs -  Physician Assistants and Nurse Practitioners) who all work together to provide you with the care you need, when you need it.  We recommend signing up for the patient portal called  "MyChart".  Sign up information is provided on this After Visit Summary.  MyChart is used to connect with patients for Virtual Visits (Telemedicine).  Patients are able to view lab/test results, encounter notes, upcoming appointments, etc.  Non-urgent messages can be sent to your provider as well.   To learn more about what you can do with MyChart, go to [email or web address].    Your next appointment:   2 month(s)  The format for your next appointment:   In Person  Provider:   Kate Sable, MD or Christell Faith, PA-C

## 2021-04-12 NOTE — Telephone Encounter (Signed)
Patient returning call and has updated her avs with the new ct scheduling number [phone]

## 2021-04-19 ENCOUNTER — Other Ambulatory Visit: Payer: Self-pay

## 2021-04-19 ENCOUNTER — Other Ambulatory Visit: Payer: Medicare HMO

## 2021-04-20 ENCOUNTER — Telehealth: Payer: Self-pay | Admitting: Nurse Practitioner

## 2021-04-20 NOTE — Telephone Encounter (Signed)
Copied from [place] [phone]. Topic: Complaint - Care >> Apr 19, 2021 11:35 AM Oneta Rack wrote: Reason for CRM: patient states PCP wanted her to have her labs recheck since 12/15/2020. Patient was upset she was turned away today prior to her having labs done and would like to transfer out the practice. I spoke with the patient and took accountability and apologized for her appointment being scheduled for labs and not with PCP.  Please have the practice administrator reach out to the patient directly

## 2021-04-29 NOTE — Telephone Encounter (Signed)
LMOM for pt to me back directly at [phone].

## 2021-05-03 ENCOUNTER — Other Ambulatory Visit: Payer: Self-pay | Admitting: Nurse Practitioner

## 2021-05-03 ENCOUNTER — Telehealth: Payer: Self-pay | Admitting: Nurse Practitioner

## 2021-05-03 ENCOUNTER — Other Ambulatory Visit: Payer: Self-pay

## 2021-05-03 DIAGNOSIS — M961 Postlaminectomy syndrome, not elsewhere classified: Secondary | ICD-10-CM

## 2021-05-03 MED ORDER — CARVEDILOL 12.5 MG PO TABS: 12.5000 mg | ORAL_TABLET | Freq: Two times a day (BID) | ORAL | 0 refills | Status: DC

## 2021-05-03 MED ORDER — SERTRALINE HCL 100 MG PO TABS: 150.0000 mg | ORAL_TABLET | Freq: Every day | ORAL | 0 refills | Status: DC

## 2021-05-03 MED ORDER — ATORVASTATIN CALCIUM 80 MG PO TABS: 80.0000 mg | ORAL_TABLET | Freq: Every day | ORAL | 1 refills | Status: DC

## 2021-05-03 NOTE — Telephone Encounter (Signed)
Copied from [place] [phone]. Topic: Appointment Scheduling - Transfer of Care >> May 03, 2021 12:56 PM Scherrie Gerlach wrote: Pt is requesting to transfer FROM: [facility] Pt is requesting to transfer TO: Caitlyn May Reason for requested transfer: pt just not happy w/ [facility].  Prefers to see someone else.  Has been at this practice 30+ yrs.

## 2021-05-03 NOTE — Telephone Encounter (Signed)
Medication Refill - Medication: pantoprazole (PROTONIX) 40 MG tablet atorvastatin (LIPITOR) 80 MG tablet sertraline (ZOLOFT) 100 MG tablet cyclobenzaprine (FLEXERIL) 5 MG tablet carvedilol (COREG) 12.5 MG tablet   Has the patient contacted their pharmacy? Yes.  But she says they did not (number or street name): [place], [place] Has the patient been seen for an appointment in the last year OR does the patient have an upcoming appointment? Yes.     Agent: Please be advised that RX refills may take up to 3 business days. We ask that you follow-up with your pharmacy.

## 2021-05-03 NOTE — Telephone Encounter (Signed)
Requested medication (s) are due for refill today: yes  Requested medication (s) are on the active medication list: yes  Future visit scheduled: 05/07/21  Notes to clinic:  Flexeril not delegated, Protonix historical provider, please assess.   Requested Prescriptions  Pending Prescriptions Disp Refills   pantoprazole (PROTONIX) 40 MG tablet      Sig: Take 1 tablet (40 mg total) by mouth at bedtime.     Gastroenterology: Proton Pump Inhibitors Passed - 05/03/2021  3:47 PM      Passed - Valid encounter within last 12 months    Recent Outpatient Visits           1 month ago Viral upper respiratory tract infection   [facility] Jon Billings, NP   2 months ago Thoracic aortic aneurysm without rupture [facility])   [facility] Jon Billings, NP   4 months ago Aortic atherosclerosis ([place])   [facility] Jon Billings, NP   6 months ago Primary hypertension   [facility] Jon Billings, NP   11 months ago Encounter for screening for malignant neoplasm of breast, unspecified screening modality   [place], Jake Church, DO       Future Appointments             In 4 days Venita Lick, NP [employer], [facility]   In 1 month Agbor-Etang, Aaron Edelman, MD [place], [facility]   In 1 month  [place], [facility]             cyclobenzaprine (FLEXERIL) 5 MG tablet 60 tablet 0    Sig: Take 1 tab once daily as needed for muscle spasm.     Not Delegated - Analgesics:  Muscle Relaxants Failed - 05/03/2021  3:47 PM      Failed - This refill cannot be delegated      Passed - Valid encounter within last 6 months    Recent Outpatient Visits           1 month ago Viral upper respiratory tract infection   [facility] Jon Billings, NP   2 months ago Thoracic aortic aneurysm without rupture [facility])   [facility] Jon Billings, NP   4 months ago  Aortic atherosclerosis ([place])   [facility] Jon Billings, NP   6 months ago Primary hypertension   [facility] Jon Billings, NP   11 months ago Encounter for screening for malignant neoplasm of breast, unspecified screening modality   [place], Jake Church, DO       Future Appointments             In 4 days Venita Lick, NP [employer], [facility]   In 1 month Agbor-Etang, Aaron Edelman, MD [place], [facility]   In 1 month  [place], [facility]            Signed Prescriptions Disp Refills   atorvastatin (LIPITOR) 80 MG tablet 90 tablet 1    Sig: Take 1 tablet (80 mg total) by mouth daily.     Cardiovascular:  Antilipid - Statins Passed - 05/03/2021  3:47 PM      Passed - Total Cholesterol in normal range and within 360 days    Cholesterol, Total  Date Value Ref Range Status  12/15/2020 128 100 - 199 mg/dL Final   Cholesterol Piccolo, Waived  Date Value Ref Range Status  12/11/2017 163 <200 mg/dL Final  Comment:                            Desirable                <200                         Borderline High      200- 239                         High                     >239           Passed - LDL in normal range and within 360 days    LDL Chol Calc (NIH)  Date Value Ref Range Status  12/15/2020 52 0 - 99 mg/dL Final          Passed - HDL in normal range and within 360 days    HDL  Date Value Ref Range Status  12/15/2020 56 >39 mg/dL Final          Passed - Triglycerides in normal range and within 360 days    Triglycerides  Date Value Ref Range Status  12/15/2020 109 0 - 149 mg/dL Final   Triglycerides Piccolo,Waived  Date Value Ref Range Status  12/11/2017 167 (H) <150 mg/dL Final    Comment:                            Normal                   <150                         Borderline High     150 - 199                         High                200 - 499                          Very High                >499           Passed - Patient is not pregnant      Passed - Valid encounter within last 12 months    Recent Outpatient Visits           1 month ago Viral upper respiratory tract infection   [facility] Jon Billings, NP   2 months ago Thoracic aortic aneurysm without rupture [facility])   [facility] Jon Billings, NP   4 months ago Aortic atherosclerosis ([place])   [facility] Jon Billings, NP   6 months ago Primary hypertension   [facility] Jon Billings, NP   11 months ago Encounter for screening for malignant neoplasm of breast, unspecified screening modality   [place], Jake Church, DO       Future Appointments             In 4 days Venita Lick, NP [employer], [place]   In 1 month Kate Sable, MD [facility]  [facility], [facility]   In 1 month  [facility]             sertraline (ZOLOFT) 100 MG tablet 135 tablet 0    Sig: Take 1.5 tablets (150 mg total) by mouth daily.     Psychiatry:  Antidepressants - SSRI Passed - 05/03/2021  3:47 PM      Passed - Completed PHQ-2 or PHQ-9 in the last 360 days      Passed - Valid encounter within last 6 months    Recent Outpatient Visits           1 month ago Viral upper respiratory tract infection   [facility] Jon Billings, NP   2 months ago Thoracic aortic aneurysm without rupture [facility])   [facility] Jon Billings, NP   4 months ago Aortic atherosclerosis ([place])   [facility] Jon Billings, NP   6 months ago Primary hypertension   [facility] Jon Billings, NP   11 months ago Encounter for screening for malignant neoplasm of breast, unspecified screening modality   [place], Jake Church, DO       Future Appointments             In 4 days Venita Lick,  NP [employer], [facility]   In 1 month Agbor-Etang, Aaron Edelman, MD [place], [facility]   In 1 month  [place], [facility]             carvedilol (COREG) 12.5 MG tablet 60 tablet 0    Sig: Take 1 tablet (12.5 mg total) by mouth 2 (two) times daily.     Cardiovascular:  Beta Blockers Passed - 05/03/2021  3:47 PM      Passed - Last BP in normal range    BP Readings from Last 1 Encounters:  04/12/21 114/70          Passed - Last Heart Rate in normal range    Pulse Readings from Last 1 Encounters:  04/12/21 81          Passed - Valid encounter within last 6 months    Recent Outpatient Visits           1 month ago Viral upper respiratory tract infection   [facility] Jon Billings, NP   2 months ago Thoracic aortic aneurysm without rupture [facility])   [facility] Jon Billings, NP   4 months ago Aortic atherosclerosis ([place])   [facility] Jon Billings, NP   6 months ago Primary hypertension   [facility] Jon Billings, NP   11 months ago Encounter for screening for malignant neoplasm of breast, unspecified screening modality   [place], Jake Church, DO       Future Appointments             In 4 days Venita Lick, NP [employer], [place]   In 1 month Agbor-Etang, Aaron Edelman, MD [place], [facility]   In 1 month  [employer], [place]

## 2021-05-03 NOTE — Telephone Encounter (Signed)
Requested Prescriptions  Pending Prescriptions Disp Refills  . pantoprazole (PROTONIX) 40 MG tablet      Sig: Take 1 tablet (40 mg total) by mouth at bedtime.     Gastroenterology: Proton Pump Inhibitors Passed - 05/03/2021  3:47 PM      Passed - Valid encounter within last 12 months    Recent Outpatient Visits          1 month ago Viral upper respiratory tract infection   [facility] Jon Billings, NP   2 months ago Thoracic aortic aneurysm without rupture [facility])   [facility] Jon Billings, NP   4 months ago Aortic atherosclerosis [facility])   [facility] Jon Billings, NP   6 months ago Primary hypertension   [facility] Jon Billings, NP   11 months ago Encounter for screening for malignant neoplasm of breast, unspecified screening modality   [place], Jake Church, DO      Future Appointments            In 4 days Venita Lick, NP [employer], [place]   In 1 month Agbor-Etang, Aaron Edelman, MD [place], [facility]   In 1 month  [employer], [facility]           . atorvastatin (LIPITOR) 80 MG tablet 90 tablet 1    Sig: Take 1 tablet (80 mg total) by mouth daily.     Cardiovascular:  Antilipid - Statins Passed - 05/03/2021  3:47 PM      Passed - Total Cholesterol in normal range and within 360 days    Cholesterol, Total  Date Value Ref Range Status  12/15/2020 128 100 - 199 mg/dL Final   Cholesterol Piccolo, Waived  Date Value Ref Range Status  12/11/2017 163 <200 mg/dL Final    Comment:                            Desirable                <200                         Borderline High      200- 239                         High                     >239          Passed - LDL in normal range and within 360 days    LDL Chol Calc (NIH)  Date Value Ref Range Status  12/15/2020 52 0 - 99 mg/dL Final         Passed - HDL in normal range and within 360 days     HDL  Date Value Ref Range Status  12/15/2020 56 >39 mg/dL Final         Passed - Triglycerides in normal range and within 360 days    Triglycerides  Date Value Ref Range Status  12/15/2020 109 0 - 149 mg/dL Final   Triglycerides Piccolo,Waived  Date Value Ref Range Status  12/11/2017 167 (H) <150 mg/dL Final    Comment:                            Normal                   <  150                         Borderline High     150 - 199                         High                200 - 499                         Very High                >499          Passed - Patient is not pregnant      Passed - Valid encounter within last 12 months    Recent Outpatient Visits          1 month ago Viral upper respiratory tract infection   [facility] Jon Billings, NP   2 months ago Thoracic aortic aneurysm without rupture [facility])   [facility] Jon Billings, NP   4 months ago Aortic atherosclerosis ([place])   [facility] Jon Billings, NP   6 months ago Primary hypertension   [facility] Jon Billings, NP   11 months ago Encounter for screening for malignant neoplasm of breast, unspecified screening modality   [place], Jake Church, DO      Future Appointments            In 4 days Venita Lick, NP [employer], [place]   In 1 month Agbor-Etang, Aaron Edelman, MD [place], [facility]   In 1 month  [employer], [facility]           . sertraline (ZOLOFT) 100 MG tablet 135 tablet 0    Sig: Take 1.5 tablets (150 mg total) by mouth daily.     Psychiatry:  Antidepressants - SSRI Passed - 05/03/2021  3:47 PM      Passed - Completed PHQ-2 or PHQ-9 in the last 360 days      Passed - Valid encounter within last 6 months    Recent Outpatient Visits          1 month ago Viral upper respiratory tract infection   [facility] Jon Billings, NP   2 months ago  Thoracic aortic aneurysm without rupture [facility])   [facility] Jon Billings, NP   4 months ago Aortic atherosclerosis ([place])   [facility] Jon Billings, NP   6 months ago Primary hypertension   [facility] Jon Billings, NP   11 months ago Encounter for screening for malignant neoplasm of breast, unspecified screening modality   [place], Jake Church, DO      Future Appointments            In 4 days Venita Lick, NP [employer], [facility]   In 1 month Agbor-Etang, Aaron Edelman, MD [place], [facility]   In 1 month  [place], [facility]           . carvedilol (COREG) 12.5 MG tablet 60 tablet 0    Sig: Take 1 tablet (12.5 mg total) by mouth 2 (two) times daily.     Cardiovascular:  Beta Blockers Passed - 05/03/2021  3:47 PM      Passed - Last BP in normal range  BP Readings from Last 1 Encounters:  04/12/21 114/70         Passed - Last Heart Rate in normal range    Pulse Readings from Last 1 Encounters:  04/12/21 81         Passed - Valid encounter within last 6 months    Recent Outpatient Visits          1 month ago Viral upper respiratory tract infection   [facility] Jon Billings, NP   2 months ago Thoracic aortic aneurysm without rupture [facility])   [facility] Jon Billings, NP   4 months ago Aortic atherosclerosis ([place])   [facility] Jon Billings, NP   6 months ago Primary hypertension   [facility] Jon Billings, NP   11 months ago Encounter for screening for malignant neoplasm of breast, unspecified screening modality   [place], Jake Church, DO      Future Appointments            In 4 days Venita Lick, NP [employer], [facility]   In 1 month Agbor-Etang, Aaron Edelman, MD [place], [facility]   In 1 month  [place], [facility]            . cyclobenzaprine (FLEXERIL) 5 MG tablet 60 tablet 0    Sig: Take 1 tab once daily as needed for muscle spasm.     Not Delegated - Analgesics:  Muscle Relaxants Failed - 05/03/2021  3:47 PM      Failed - This refill cannot be delegated      Passed - Valid encounter within last 6 months    Recent Outpatient Visits          1 month ago Viral upper respiratory tract infection   [facility] Jon Billings, NP   2 months ago Thoracic aortic aneurysm without rupture [facility]   [facility] Jon Billings, NP   4 months ago Aortic atherosclerosis ([place])   [facility] Jon Billings, NP   6 months ago Primary hypertension   [facility] Jon Billings, NP   11 months ago Encounter for screening for malignant neoplasm of breast, unspecified screening modality   [place], Jake Church, DO      Future Appointments            In 4 days Venita Lick, NP [employer], [place]   In 1 month Agbor-Etang, Aaron Edelman, MD [facility], [facility]   In 1 month  [employer], [facility]

## 2021-05-03 NOTE — Telephone Encounter (Signed)
Medication Refill - Medication: pantoprazole (PROTONIX) 40 MG tablet atorvastatin (LIPITOR) 80 MG tablet sertraline (ZOLOFT) 100 MG tablet cyclobenzaprine (FLEXERIL) 5 MG tablet carvedilol (COREG) 12.5 MG tablet  Has the patient contacted their pharmacy? Yes.  But she says they did not (number or street name): [place], [place] Has the patient been seen for an appointment in the last year OR does the patient have an upcoming appointment? Yes.    Agent: Please be advised that RX refills may take up to 3 business days. We ask that you follow-up with your pharmacy.

## 2021-05-04 MED ORDER — PANTOPRAZOLE SODIUM 40 MG PO TBEC: 40.0000 mg | DELAYED_RELEASE_TABLET | Freq: Every day | ORAL | 0 refills | Status: DC

## 2021-05-04 MED ORDER — GABAPENTIN 300 MG PO CAPS: 300.0000 mg | ORAL_CAPSULE | Freq: Three times a day (TID) | ORAL | 0 refills | Status: DC

## 2021-05-04 MED ORDER — BENAZEPRIL HCL 40 MG PO TABS: 40.0000 mg | ORAL_TABLET | Freq: Every day | ORAL | 0 refills | Status: DC

## 2021-05-04 MED ORDER — CYCLOBENZAPRINE HCL 5 MG PO TABS: ORAL_TABLET | ORAL | 0 refills | Status: DC

## 2021-05-04 NOTE — Telephone Encounter (Signed)
30 day supply sent for patient to make an appt.

## 2021-05-07 ENCOUNTER — Encounter: Payer: Self-pay | Admitting: Nurse Practitioner

## 2021-05-07 ENCOUNTER — Ambulatory Visit (INDEPENDENT_AMBULATORY_CARE_PROVIDER_SITE_OTHER): Payer: Medicare HMO | Admitting: Nurse Practitioner

## 2021-05-07 ENCOUNTER — Other Ambulatory Visit: Payer: Self-pay

## 2021-05-07 VITALS — BP 137/87 | HR 98 | Temp 98.1°F | Wt 113.4 lb

## 2021-05-07 DIAGNOSIS — F419 Anxiety disorder, unspecified: Secondary | ICD-10-CM | POA: Diagnosis not present

## 2021-05-07 DIAGNOSIS — K219 Gastro-esophageal reflux disease without esophagitis: Secondary | ICD-10-CM | POA: Diagnosis not present

## 2021-05-07 DIAGNOSIS — M961 Postlaminectomy syndrome, not elsewhere classified: Secondary | ICD-10-CM

## 2021-05-07 DIAGNOSIS — F339 Major depressive disorder, recurrent, unspecified: Secondary | ICD-10-CM

## 2021-05-07 DIAGNOSIS — J432 Centrilobular emphysema: Secondary | ICD-10-CM

## 2021-05-07 DIAGNOSIS — I700 Atherosclerosis of aorta: Secondary | ICD-10-CM

## 2021-05-07 DIAGNOSIS — Z23 Encounter for immunization: Secondary | ICD-10-CM

## 2021-05-07 DIAGNOSIS — R7989 Other specified abnormal findings of blood chemistry: Secondary | ICD-10-CM

## 2021-05-07 DIAGNOSIS — E782 Mixed hyperlipidemia: Secondary | ICD-10-CM

## 2021-05-07 DIAGNOSIS — I10 Essential (primary) hypertension: Secondary | ICD-10-CM

## 2021-05-07 MED ORDER — TRELEGY ELLIPTA 100-62.5-25 MCG/ACT IN AEPB: 1.0000 | INHALATION_SPRAY | Freq: Every day | RESPIRATORY_TRACT | 11 refills | Status: DC

## 2021-05-07 MED ORDER — MEGESTROL ACETATE 40 MG PO TABS: 40.0000 mg | ORAL_TABLET | Freq: Four times a day (QID) | ORAL | 0 refills | Status: DC | PRN

## 2021-05-07 MED ORDER — CARVEDILOL 12.5 MG PO TABS
12.5000 mg | ORAL_TABLET | Freq: Two times a day (BID) | ORAL | 1 refills | Status: DC
Start: 2021-05-07 — End: 2021-10-20

## 2021-05-07 MED ORDER — CYCLOBENZAPRINE HCL 5 MG PO TABS: ORAL_TABLET | ORAL | 0 refills | Status: DC

## 2021-05-07 MED ORDER — MONTELUKAST SODIUM 10 MG PO TABS: 10.0000 mg | ORAL_TABLET | Freq: Every day | ORAL | 1 refills | Status: DC

## 2021-05-07 MED ORDER — BENAZEPRIL HCL 40 MG PO TABS: 40.0000 mg | ORAL_TABLET | Freq: Every day | ORAL | 1 refills | Status: DC

## 2021-05-07 MED ORDER — PANTOPRAZOLE SODIUM 40 MG PO TBEC: 40.0000 mg | DELAYED_RELEASE_TABLET | Freq: Every day | ORAL | 1 refills | Status: DC

## 2021-05-07 MED ORDER — GABAPENTIN 300 MG PO CAPS: 300.0000 mg | ORAL_CAPSULE | Freq: Three times a day (TID) | ORAL | 1 refills | Status: DC

## 2021-05-07 NOTE — Patient Instructions (Signed)

## 2021-05-07 NOTE — Assessment & Plan Note (Signed)
Ongoing, continue current regimen.

## 2021-05-07 NOTE — Assessment & Plan Note (Signed)
Chronic, ongoing.  Continue current medication regimen and adjust as needed. Lipid panel today. 

## 2021-05-07 NOTE — Assessment & Plan Note (Signed)
Chronic, stable.  Denies SI/HI.  Continue current medication regimen and adjust as needed.  Refills sent in.

## 2021-05-07 NOTE — Assessment & Plan Note (Signed)
Chronic, stable. Denies SI/HI.  Continue current medication regimen and adjust as needed.  Refills sent.

## 2021-05-07 NOTE — Assessment & Plan Note (Signed)
Chronic, ongoing.  Continues to use Albuterol daily and has baseline SOB.  Will stop Stiolto and change to Trelegy, discussed with patient.  Due to symptoms and frequent use of rescue inhaler will also get back into pulmonary for further testing.  Continue annual lung screening.  Recommend complete cessation of smoking.

## 2021-05-07 NOTE — Assessment & Plan Note (Signed)
Chronic, stable.  Continue Protonix daily and attempt reductions in future. Mag level today.

## 2021-05-07 NOTE — Assessment & Plan Note (Signed)
Check TSH today.  Initiate medication as needed.

## 2021-05-07 NOTE — Assessment & Plan Note (Signed)
Noted on past imaging, at this time recommend complete cessation of smoking and will continue Atorvastatin for prevention.  Lipid panel today.

## 2021-05-07 NOTE — Progress Notes (Signed)
BP 137/87   Pulse 98   Temp 98.1 F (36.7 C)   Wt 113 lb 6.4 oz (51.4 kg)   SpO2 98%   BMI 18.30 kg/m    Subjective:    Patient ID: Caitlyn May, female    DOB: 1956-05-16, 65 y.o.   MRN: [id number]  HPI: Caitlyn May is a 65 y.o. female  Chief Complaint  Patient presents with   Hypertension   Ear Pain    Patient states she would like to have her right ear that she can't even lay her head down on that side and having sounds in her like a pulse and ringing in her ear. Patient states she first noticed it about 4 weeks now. Patient denies trying any over the counter medication.    Medication Problem    Patient states she has had issues with her getting her medication refilled and she has been without her medications and she has been had diarrhea for the past 4 days and she has noticed she lost 4-lbs and states she is without her stomach medication.    Immunizations    Patient would like to discuss with provider if it is OK for her to get the Flu and Pneumonia vaccine together at today's visit.    HYPERTENSION Continues on Benazepril, Amlodipine, and Carvedilol + takes Atorvastatin.  Saw cardiology on 04/12/21 for syncope and ectatic aorta past screening -- ascending thoracic aorta measuring 3.4 cm.  Has upcoming scans. Hypertension status: stable  Satisfied with current treatment? yes Duration of hypertension: chronic BP monitoring frequency:  a few times a week BP range: 130/80 on average BP medication side effects:  no Medication compliance: fair compliance Previous BP meds: as on chart Aspirin: no Recurrent headaches: no Visual changes: no Palpitations: no Dyspnea: no Chest pain: no Lower extremity edema: no Dizzy/lightheaded: no   COPD Uses Stiolto daily and Albuterol as needed.  Currently smoking 1/2 PPD, is reducing herself, was a 1 PPD smoker.  Had CT chest last 08/15/2019 -- noted aortic atherosclerosis and emphysema.  Has smoked since she was 16-15 years  old. COPD status: stable Satisfied with current treatment?: yes Oxygen use: no Dyspnea frequency: at baseline Cough frequency: occasional Rescue inhaler frequency: every day Limitation of activity: no Productive cough: none Last Spirometry: none Pneumovax:  provided today Influenza:  provided today    GERD Continues on Protonix, has been out of this and needs refills. GERD control status: uncontrolled Satisfied with current treatment? yes Heartburn frequency: more without medication Medication side effects: no  Medication compliance: stable Previous GERD medications: Antacid use frequency:  none Dysphagia: no Odynophagia:  no Hematemesis: no Blood in stool: no EGD: no   DEPRESSION Takes Sertraline 150 MG daily. Mood status: stable Satisfied with current treatment?: yes Symptom severity: moderate  Duration of current treatment : chronic Side effects: no Medication compliance: good compliance Psychotherapy/counseling: none Previous psychiatric medications: as on chart Depressed mood: no Anxious mood: no Anhedonia: no Significant weight loss or gain: no Insomnia: occasional Fatigue: no Feelings of worthlessness or guilt: no Impaired concentration/indecisiveness: no Suicidal ideations: no Hopelessness: no Crying spells: no Depression screen [facility] 2/9 05/07/2021 02/03/2021 10/23/2020 06/12/2020 05/19/2020  Decreased Interest 1 0 0 1 2  Down, Depressed, Hopeless 2 1 0 1 2  PHQ - 2 Score 3 1 0 2 4  Altered sleeping '3 3 2 ' 0 0  Tired, decreased energy '3 3 3 3 2  ' Change in appetite 1 0 1 0 0  Feeling bad or failure about yourself  1 0 0 0 0  Trouble concentrating 0 0 0 0 1  Moving slowly or fidgety/restless 0 0 0 0 1  Suicidal thoughts 0 0 0 0 0  PHQ-9 Score '11 7 6 5 8  ' Difficult doing work/chores - Not difficult at all Not difficult at all Not difficult at all Not difficult at all  Some recent data might be hidden   TINNITUS To right ear over past 4 weeks. Ringing and  pulsating when she lies down. Duration: weeks Description of tinnitus: ringing Pulsatile: yes Tinnitus duration: hours Episode frequency: recurrent Severity: mild Aggravating factors: lying down Alleviating factors: unknown Head injury: no Chronic exposure to loud noises: no Exposure to ototoxic medications: no Vertigo:no Hearing loss: no Aural fullness: no Headache:no  TMJ syndrome symptoms:  yes Unsteady gait: no Postural instability: no Diplopia, dysarthria, dysphagia or weakness: no Anxietydepression: yes   Relevant past medical, surgical, family and social history reviewed and updated as indicated. Interim medical history since our last visit reviewed. Allergies and medications reviewed and updated.  Review of Systems  Constitutional:  Negative for activity change, appetite change, diaphoresis, fatigue and fever.  HENT:  Negative for ear discharge and ear pain.   Respiratory:  Negative for cough, chest tightness and shortness of breath.   Cardiovascular:  Negative for chest pain, palpitations and leg swelling.  Gastrointestinal: Negative.   Endocrine: Negative.   Neurological: Negative.   Psychiatric/Behavioral: Negative.     Per HPI unless specifically indicated above     Objective:    BP 137/87   Pulse 98   Temp 98.1 F (36.7 C)   Wt 113 lb 6.4 oz (51.4 kg)   SpO2 98%   BMI 18.30 kg/m   Wt Readings from Last 3 Encounters:  05/07/21 113 lb 6.4 oz (51.4 kg)  04/12/21 117 lb (53.1 kg)  02/03/21 113 lb (51.3 kg)    Physical Exam Vitals and nursing note reviewed.  Constitutional:      General: She is awake. She is not in acute distress.    Appearance: She is well-developed and well-groomed. She is not ill-appearing or toxic-appearing.  HENT:     Head: Normocephalic.     Right Ear: Hearing, tympanic membrane, ear canal and external ear normal.     Left Ear: Hearing, tympanic membrane, ear canal and external ear normal.     Ears:     Comments: Mild  tenderness along TMJ right side.    Nose: Nose normal.     Mouth/Throat:     Mouth: Mucous membranes are moist.  Eyes:     General: Lids are normal.        Right eye: No discharge.        Left eye: No discharge.     Conjunctiva/sclera: Conjunctivae normal.     Pupils: Pupils are equal, round, and reactive to light.  Neck:     Thyroid: No thyromegaly.     Vascular: No carotid bruit.  Cardiovascular:     Rate and Rhythm: Normal rate and regular rhythm.     Heart sounds: Normal heart sounds. No murmur heard.   No gallop.  Pulmonary:     Effort: Pulmonary effort is normal. No accessory muscle usage or respiratory distress.     Breath sounds: Normal breath sounds.  Abdominal:     General: Bowel sounds are normal.     Palpations: Abdomen is soft. There is no hepatomegaly or splenomegaly.  Musculoskeletal:  Cervical back: Normal range of motion and neck supple.     Right lower leg: No edema.     Left lower leg: No edema.  Lymphadenopathy:     Cervical: No cervical adenopathy.  Skin:    General: Skin is warm and dry.  Neurological:     Mental Status: She is alert and oriented to person, place, and time.  Psychiatric:        Attention and Perception: Attention normal.        Mood and Affect: Mood normal.        Speech: Speech normal.        Behavior: Behavior normal. Behavior is cooperative.        Thought Content: Thought content normal.    Results for orders placed or performed in visit on 12/15/20  Microscopic Examination   Urine  Result Value Ref Range   WBC, UA 0-5 0 - 5 /hpf   RBC None seen 0 - 2 /hpf   Epithelial Cells (non renal) 0-10 0 - 10 /hpf   Bacteria, UA Few None seen/Few  Comp Met (CMET)  Result Value Ref Range   Glucose 86 65 - 99 mg/dL   BUN 17 8 - 27 mg/dL   Creatinine, Ser 1.00 0.57 - 1.00 mg/dL   eGFR 62 >59 mL/min/1.73   BUN/Creatinine Ratio 17 12 - 28   Sodium 143 134 - 144 mmol/L   Potassium 3.6 3.5 - 5.2 mmol/L   Chloride 103 96 - 106  mmol/L   CO2 25 20 - 29 mmol/L   Calcium 9.2 8.7 - 10.3 mg/dL   Total Protein 7.0 6.0 - 8.5 g/dL   Albumin 4.3 3.8 - 4.8 g/dL   Globulin, Total 2.7 1.5 - 4.5 g/dL   Albumin/Globulin Ratio 1.6 1.2 - 2.2   Bilirubin Total 0.5 0.0 - 1.2 mg/dL   Alkaline Phosphatase 71 44 - 121 IU/L   AST 15 0 - 40 IU/L   ALT 14 0 - 32 IU/L  Lipid Profile  Result Value Ref Range   Cholesterol, Total 128 100 - 199 mg/dL   Triglycerides 109 0 - 149 mg/dL   HDL 56 >39 mg/dL   VLDL Cholesterol Cal 20 5 - 40 mg/dL   LDL Chol Calc (NIH) 52 0 - 99 mg/dL   Chol/HDL Ratio 2.3 0.0 - 4.4 ratio  Vitamin D (25 hydroxy)  Result Value Ref Range   Vit D, 25-Hydroxy 37.0 30.0 - 100.0 ng/mL  CBC w/Diff  Result Value Ref Range   WBC 10.4 3.4 - 10.8 x10E3/uL   RBC 4.69 3.77 - 5.28 x10E6/uL   Hemoglobin 14.6 11.1 - 15.9 g/dL   Hematocrit 44.4 34.0 - 46.6 %   MCV 95 79 - 97 fL   MCH 31.1 26.6 - 33.0 pg   MCHC 32.9 31.5 - 35.7 g/dL   RDW 11.8 11.7 - 15.4 %   Platelets 291 150 - 450 x10E3/uL   Neutrophils 69 Not Estab. %   Lymphs 20 Not Estab. %   Monocytes 7 Not Estab. %   Eos 2 Not Estab. %   Basos 1 Not Estab. %   Neutrophils Absolute 7.3 (H) 1.4 - 7.0 x10E3/uL   Lymphocytes Absolute 2.1 0.7 - 3.1 x10E3/uL   Monocytes Absolute 0.7 0.1 - 0.9 x10E3/uL   EOS (ABSOLUTE) 0.3 0.0 - 0.4 x10E3/uL   Basophils Absolute 0.1 0.0 - 0.2 x10E3/uL   Immature Granulocytes 1 Not Estab. %   Immature Grans (Abs) 0.1  0.0 - 0.1 x10E3/uL  Urinalysis, Routine w reflex microscopic  Result Value Ref Range   Specific Gravity, UA 1.010 1.005 - 1.030   pH, UA 6.0 5.0 - 7.5   Color, UA Yellow Yellow   Appearance Ur Clear Clear   Leukocytes,UA Negative Negative   Protein,UA Trace (A) Negative/Trace   Glucose, UA Negative Negative   Ketones, UA Negative Negative   RBC, UA Negative Negative   Bilirubin, UA Negative Negative   Urobilinogen, Ur 0.2 0.2 - 1.0 mg/dL   Nitrite, UA Negative Negative   Microscopic Examination See below:        Assessment & Plan:   Problem List Items Addressed This Visit       Cardiovascular and Mediastinum   Aortic atherosclerosis ([place])    Noted on past imaging, at this time recommend complete cessation of smoking and will continue Atorvastatin for prevention.  Lipid panel today.      Relevant Medications   benazepril (LOTENSIN) 40 MG tablet   carvedilol (COREG) 12.5 MG tablet   Hypertension    Chronic, ongoing.  BP at goal today.  Recommend she monitor BP at least a few mornings a week at home and document.  DASH diet at home.  Continue current medication regimen and adjust as needed.  Labs today: CBC, CMP, Lipid.  Refills sent.  Continue collaboration with cardiology.  Return in 6 months.       Relevant Medications   benazepril (LOTENSIN) 40 MG tablet   carvedilol (COREG) 12.5 MG tablet   Other Relevant Orders   CBC with Differential/Platelet   Comprehensive metabolic panel     Respiratory   Centrilobular emphysema (HCC) - Primary    Chronic, ongoing.  Continues to use Albuterol daily and has baseline SOB.  Will stop Stiolto and change to Trelegy, discussed with patient.  Due to symptoms and frequent use of rescue inhaler will also get back into pulmonary for further testing.  Continue annual lung screening.  Recommend complete cessation of smoking.      Relevant Medications   Fluticasone-Umeclidin-Vilant (TRELEGY ELLIPTA) 100-62.5-25 MCG/ACT AEPB   montelukast (SINGULAIR) 10 MG tablet   Other Relevant Orders   Ambulatory referral to Pulmonology     Digestive   Acid reflux    Chronic, stable.  Continue Protonix daily and attempt reductions in future. Mag level today.      Relevant Medications   pantoprazole (PROTONIX) 40 MG tablet   Other Relevant Orders   Magnesium     Other   Postlaminectomy syndrome, lumbar region (Chronic)    Ongoing, continue current regimen.      Relevant Medications   cyclobenzaprine (FLEXERIL) 5 MG tablet   Anxiety    Chronic, stable.   Denies SI/HI.  Continue current medication regimen and adjust as needed.  Refills sent in.      Depression, recurrent (HCC)    Chronic, stable. Denies SI/HI.  Continue current medication regimen and adjust as needed.  Refills sent.      Elevated TSH    Check TSH today.  Initiate medication as needed.      Relevant Orders   TSH   Hyperlipidemia    Chronic, ongoing.  Continue current medication regimen and adjust as needed.  Lipid panel today.         Relevant Medications   benazepril (LOTENSIN) 40 MG tablet   carvedilol (COREG) 12.5 MG tablet   Other Relevant Orders   Comprehensive metabolic panel   Lipid Panel w/o Chol/HDL  Ratio   Other Visit Diagnoses     Flu vaccine need       Flu vaccine today   Relevant Orders   Flu Vaccine QUAD High Dose(Fluad) (Completed)   Pneumococcal polysaccharide vaccine 23-valent greater than or equal to 2yo subcutaneous/IM (Completed)   Pneumococcal vaccination given       PPSV23 provided today.   Relevant Orders   Pneumococcal polysaccharide vaccine 23-valent greater than or equal to 2yo subcutaneous/IM (Completed)        Follow up plan: Return in about 6 months (around 11/05/2021) for HTN/HLD, COPD, GERD, MOOD.

## 2021-05-07 NOTE — Assessment & Plan Note (Addendum)
Chronic, ongoing.  BP at goal today.  Recommend she monitor BP at least a few mornings a week at home and document.  DASH diet at home.  Continue current medication regimen and adjust as needed.  Labs today: CBC, CMP, Lipid.  Refills sent.  Continue collaboration with cardiology.  Return in 6 months.

## 2021-05-08 LAB — COMPREHENSIVE METABOLIC PANEL
ALT: 14 IU/L (ref 0–32)
AST: 17 IU/L (ref 0–40)
Albumin/Globulin Ratio: 2 (ref 1.2–2.2)
Albumin: 4.7 g/dL (ref 3.8–4.8)
Alkaline Phosphatase: 65 IU/L (ref 44–121)
BUN/Creatinine Ratio: 15 (ref 12–28)
BUN: 14 mg/dL (ref 8–27)
Bilirubin Total: 0.4 mg/dL (ref 0.0–1.2)
CO2: 18 mmol/L — ABNORMAL LOW (ref 20–29)
Calcium: 9.4 mg/dL (ref 8.7–10.3)
Chloride: 107 mmol/L — ABNORMAL HIGH (ref 96–106)
Creatinine, Ser: 0.96 mg/dL (ref 0.57–1.00)
Globulin, Total: 2.4 g/dL (ref 1.5–4.5)
Glucose: 87 mg/dL (ref 70–99)
Potassium: 3.7 mmol/L (ref 3.5–5.2)
Sodium: 141 mmol/L (ref 134–144)
Total Protein: 7.1 g/dL (ref 6.0–8.5)
eGFR: 65 mL/min/{1.73_m2} (ref >59)

## 2021-05-08 LAB — LIPID PANEL W/O CHOL/HDL RATIO
Cholesterol, Total: 154 mg/dL (ref 100–199)
HDL: 44 mg/dL (ref >39)
LDL Chol Calc (NIH): 86 mg/dL (ref 0–99)
Triglycerides: 133 mg/dL (ref 0–149)
VLDL Cholesterol Cal: 24 mg/dL (ref 5–40)

## 2021-05-08 LAB — CBC WITH DIFFERENTIAL/PLATELET
Basophils Absolute: 0.1 10*3/uL (ref 0.0–0.2)
Basos: 1 %
EOS (ABSOLUTE): 0.2 10*3/uL (ref 0.0–0.4)
Eos: 2 %
Hematocrit: 45.6 % (ref 34.0–46.6)
Hemoglobin: 15.3 g/dL (ref 11.1–15.9)
Immature Grans (Abs): 0 10*3/uL (ref 0.0–0.1)
Immature Granulocytes: 0 %
Lymphocytes Absolute: 2.6 10*3/uL (ref 0.7–3.1)
Lymphs: 26 %
MCH: 31.9 pg (ref 26.6–33.0)
MCHC: 33.6 g/dL (ref 31.5–35.7)
MCV: 95 fL (ref 79–97)
Monocytes Absolute: 0.8 10*3/uL (ref 0.1–0.9)
Monocytes: 8 %
Neutrophils Absolute: 6.2 10*3/uL (ref 1.4–7.0)
Neutrophils: 63 %
Platelets: 255 10*3/uL (ref 150–450)
RBC: 4.79 x10E6/uL (ref 3.77–5.28)
RDW: 12.3 % (ref 11.7–15.4)
WBC: 9.8 10*3/uL (ref 3.4–10.8)

## 2021-05-08 LAB — TSH: TSH: 5.7 u[IU]/mL — ABNORMAL HIGH (ref 0.450–4.500)

## 2021-05-08 LAB — MAGNESIUM: Magnesium: 1.8 mg/dL (ref 1.6–2.3)

## 2021-05-09 ENCOUNTER — Other Ambulatory Visit: Payer: Self-pay | Admitting: Nurse Practitioner

## 2021-05-09 DIAGNOSIS — R7989 Other specified abnormal findings of blood chemistry: Secondary | ICD-10-CM

## 2021-05-09 NOTE — Progress Notes (Signed)
Good morning crew, please let Jenina know her labs have returned: - CBC is nice and normal showing no anemia or infection. - Kidney and liver function are normal. Magnesium level normal. - Cholesterol levels stable, continue Atorvastatin every day. - Thyroid level is a little elevated -- I will need you to schedule a lab only visit for 6 weeks to recheck this, if ongoing elevations I would recommend we start medication for this as it means your thyroid is underactive and not releasing enough thyroid hormone, this hormone is important for the body's health.  Please schedule lab only visit.  Any questions? Keep being amazing!!  Thank you for allowing me to participate in your care.  I appreciate you. Kindest regards, Doreatha Offer

## 2021-05-10 NOTE — Telephone Encounter (Signed)
Spoke with patient regarding concerns. Agreed to switch patient's PCP to Dr. Neomia Dear.

## 2021-05-19 ENCOUNTER — Other Ambulatory Visit: Payer: Medicare HMO

## 2021-06-14 ENCOUNTER — Ambulatory Visit: Payer: Medicare HMO | Admitting: Cardiology

## 2021-06-18 ENCOUNTER — Ambulatory Visit: Payer: Medicare HMO

## 2021-06-21 ENCOUNTER — Other Ambulatory Visit: Payer: Medicare HMO

## 2021-06-22 ENCOUNTER — Ambulatory Visit (INDEPENDENT_AMBULATORY_CARE_PROVIDER_SITE_OTHER): Payer: Medicare HMO | Admitting: *Deleted

## 2021-06-22 ENCOUNTER — Other Ambulatory Visit: Payer: Self-pay

## 2021-06-22 ENCOUNTER — Ambulatory Visit (INDEPENDENT_AMBULATORY_CARE_PROVIDER_SITE_OTHER): Payer: Medicare HMO

## 2021-06-22 DIAGNOSIS — R55 Syncope and collapse: Secondary | ICD-10-CM

## 2021-06-22 DIAGNOSIS — Z0000 Encounter for general adult medical examination without abnormal findings: Secondary | ICD-10-CM | POA: Diagnosis not present

## 2021-06-22 LAB — ECHOCARDIOGRAM COMPLETE
AR max vel: 2.28 cm2
AV Area VTI: 2.37 cm2
AV Area mean vel: 2.23 cm2
AV Mean grad: 3 mmHg
AV Peak grad: 5.2 mmHg
Ao pk vel: 1.14 m/s
Area-P 1/2: 1.98 cm2
S' Lateral: 2 cm
Single Plane A4C EF: 52.1 %

## 2021-06-22 NOTE — Progress Notes (Signed)
Subjective:   Caitlyn May is a 66 y.o. female who presents for Medicare Annual (Subsequent) preventive examination.  I connected with  Caitlyn May on 06/22/21 by a telephone enabled telemedicine application and verified that I am speaking with the correct person using two identifiers.   I discussed the limitations of evaluation and management by telemedicine. The patient expressed understanding and agreed to proceed.  Patient location: home  Provider location: Tele-Health  not in office   Review of Systems     Cardiac Risk Factors include: advanced age (>62men, >29 women);hypertension;sedentary lifestyle;smoking/ tobacco exposure     Objective:    Today's Vitals   06/22/21 0904  PainSc: 7    There is no height or weight on file to calculate BMI.  Advanced Directives 06/22/2021 06/22/2021 06/12/2020 06/10/2019 06/25/2018 05/03/2018 04/14/2017  Does Patient Have a Medical Advance Directive? No No No No No No No  Would patient like information on creating a medical advance directive? No - Patient declined No - Patient declined - Yes (MAU/Ambulatory/Procedural Areas - Information given) - Yes (MAU/Ambulatory/Procedural Areas - Information given) Yes (MAU/Ambulatory/Procedural Areas - Information given)    Current Medications (verified) Outpatient Encounter Medications as of 06/22/2021  Medication Sig   albuterol (VENTOLIN HFA) 108 (90 Base) MCG/ACT inhaler Inhale 2 puffs into the lungs every 6 (six) hours as needed for wheezing or shortness of breath.   amLODipine (NORVASC) 5 MG tablet Take 1 tablet (5 mg total) by mouth daily.   atorvastatin (LIPITOR) 80 MG tablet Take 1 tablet (80 mg total) by mouth daily.   benazepril (LOTENSIN) 40 MG tablet Take 1 tablet (40 mg total) by mouth daily.   carvedilol (COREG) 12.5 MG tablet Take 1 tablet (12.5 mg total) by mouth 2 (two) times daily.   cyclobenzaprine (FLEXERIL) 5 MG tablet Take 1 tab once daily as needed for muscle spasm.    Fluticasone-Umeclidin-Vilant (TRELEGY ELLIPTA) 100-62.5-25 MCG/ACT AEPB Inhale 1 puff into the lungs daily.   gabapentin (NEURONTIN) 300 MG capsule Take 1 capsule (300 mg total) by mouth 3 (three) times daily.   megestrol (MEGACE) 40 MG tablet Take 1 tablet (40 mg total) by mouth 4 (four) times daily as needed.   montelukast (SINGULAIR) 10 MG tablet Take 1 tablet (10 mg total) by mouth at bedtime.   ondansetron (ZOFRAN ODT) 8 MG disintegrating tablet Take 1 tablet (8 mg total) by mouth every 8 (eight) hours as needed for nausea or vomiting.   pantoprazole (PROTONIX) 40 MG tablet Take 1 tablet (40 mg total) by mouth at bedtime.   sertraline (ZOLOFT) 100 MG tablet Take 1.5 tablets (150 mg total) by mouth daily.   No facility-administered encounter medications on file as of 06/22/2021.    Allergies (verified) Accupril [quinapril hcl], Avelox [moxifloxacin], Ciprofloxacin, Hctz [hydrochlorothiazide], Penicillins, and Sulfa antibiotics   History: Past Medical History:  Diagnosis Date   Acute rhinosinusitis 07/23/2019   Chronic kidney disease    Depression    Failed back syndrome, lumbar    Hyperlipidemia    Hypertension    Osteoporosis    Weight loss    Past Surgical History:  Procedure Laterality Date   ABDOMINAL HYSTERECTOMY     CESAREAN SECTION     MOUTH SURGERY     SPINE SURGERY     Family History  Problem Relation Age of Onset   Hypertension Mother    Dementia Mother    Cancer Father    Cancer Maternal Aunt  breast   Diabetes Neg Hx    Heart disease Neg Hx    Stroke Neg Hx    COPD Neg Hx    Social History   Socioeconomic History   Marital status: Married    Spouse name: Not on file   Number of children: Not on file   Years of education: 10   Highest education level: 10th grade  Occupational History   Occupation: disability  Tobacco Use   Smoking status: Every Day    Packs/day: 1.00    Types: Cigarettes   Smokeless tobacco: Never  Vaping Use   Vaping  Use: Never used  Substance and Sexual Activity   Alcohol use: No   Drug use: Not Currently    Types: Marijuana    Comment: last week estimated 05/10/2019   Sexual activity: Not on file  Other Topics Concern   Not on file  Social History Narrative   Not on file   Social Determinants of Health   Financial Resource Strain: Low Risk    Difficulty of Paying Living Expenses: Not hard at all  Food Insecurity: No Food Insecurity   Worried About Charity fundraiser in the Last Year: Never true   [place] in the Last Year: Never true  Transportation Needs: No Transportation Needs   Lack of Transportation (Medical): No   Lack of Transportation (Non-Medical): No  Physical Activity: Inactive   Days of Exercise per Week: 0 days   Minutes of Exercise per Session: 0 min  Stress: Stress Concern Present   Feeling of Stress : To some extent  Social Connections: Unknown   Frequency of Communication with Friends and Family: Once a week   Frequency of Social Gatherings with Friends and Family: Once a week   Attends Religious Services: Never   Marine scientist or Organizations: No   Attends Music therapist: Never   Marital Status: Not on file    Tobacco Counseling Ready to quit: Not Answered Counseling given: Not Answered   Clinical Intake:  Pre-visit preparation completed: Yes  Pain : 0-10 Pain Score: 7  Pain Type: Chronic pain Pain Location: Back Pain Orientation: Right Pain Descriptors / Indicators: Constant, Burning, Aching, Sharp Pain Onset: More than a month ago Pain Frequency: Constant Pain Relieving Factors: tylenol  Pain Relieving Factors: tylenol  Nutritional Risks: None Diabetes: No  How often do you need to have someone help you when you read instructions, pamphlets, or other written materials from your doctor or pharmacy?: 1 - Never  Diabetic?  no  Interpreter Needed?: No  Information entered by :: Leroy Kennedy LPN   Activities of  Daily Living In your present state of health, do you have any difficulty performing the following activities: 06/22/2021  Hearing? N  Vision? N  Difficulty concentrating or making decisions? N  Walking or climbing stairs? Y  Dressing or bathing? N  Doing errands, shopping? N  Preparing Food and eating ? N  Using the Toilet? N  In the past six months, have you accidently leaked urine? N  Do you have problems with loss of bowel control? N  Managing your Medications? N  Managing your Finances? N  Some recent data might be hidden    Patient Care Team: Jon Billings, NP as PCP - General Kate Sable, MD as PCP - Cardiology (Cardiology) Vladimir Faster, [facility] (Inactive) (Pharmacist)  Indicate any recent Medical Services you may have received from other than [facility] providers in  the past year (date may be approximate).     Assessment:   This is a routine wellness examination for [place].  Hearing/Vision screen Hearing Screening - Comments:: No hearing aids or trouble hearing Vision Screening - Comments:: Up to date [facility]  Dietary issues and exercise activities discussed: Current Exercise Habits: The patient does not participate in regular exercise at present, Exercise limited by: None identified   Goals Addressed             This Visit's Progress    DIET - INCREASE WATER INTAKE   Not on track    Recommend drinking at least 5-6 glasses of water a day      Quit Smoking   On track    Smoking cessation discussed     Quit Smoking       Continue to decrease smoking       Depression Screen PHQ 2/9 Scores 06/22/2021 05/07/2021 02/03/2021 10/23/2020 06/12/2020 05/19/2020 04/30/2020  PHQ - 2 Score 2 3 1  0 2 4 -  PHQ- 9 Score 8 11 7 6 5 8  -  Exception Documentation - - - - - - Patient refusal    Fall Risk Fall Risk  06/22/2021 06/12/2020 06/10/2019 05/03/2018 03/14/2018  Falls in the past year? 1 0 0 1 No  Number falls in past yr: 0 - 0 1 -  Injury with Fall? 0 - 0 0 -   Risk Factor Category  - - - - -  Risk for fall due to : - Medication side effect - - -  Follow up Falls evaluation completed;Falls prevention discussed Falls evaluation completed;Education provided;Falls prevention discussed - Education provided -    FALL RISK PREVENTION PERTAINING TO THE HOME:  Any stairs in or around the home? Yes  If so, are there any without handrails? No  Home free of loose throw rugs in walkways, pet beds, electrical cords, etc? Yes  Adequate lighting in your home to reduce risk of falls? Yes   ASSISTIVE DEVICES UTILIZED TO PREVENT FALLS:  Life alert? No  Use of a cane, walker or w/c? Yes  Grab bars in the bathroom? No  Shower chair or bench in shower? Yes  Elevated toilet seat or a handicapped toilet? No   TIMED UP AND GO:  Was the test performed? No .    Cognitive Function:     6CIT Screen 06/12/2020 06/10/2019 05/03/2018 04/14/2017  What Year? 0 points 0 points 0 points 0 points  What month? 0 points 0 points 0 points 0 points  What time? 0 points 0 points 0 points 0 points  Count back from 20 0 points 0 points 0 points 0 points  Months in reverse 2 points 0 points 2 points 0 points  Repeat phrase 2 points 0 points 2 points 0 points  Total Score 4 0 4 0    Immunizations Immunization History  Administered Date(s) Administered   Fluad Quad(high Dose 65+) 03/23/2020, 05/07/2021   Influenza, High Dose Seasonal PF 04/14/2017   Influenza,inj,Quad PF,6+ Mos 03/31/2015, 02/10/2016, 03/30/2018, 06/10/2019   Influenza-Unspecified 03/11/2014   Pneumococcal Conjugate-13 03/23/2020   Pneumococcal Polysaccharide-23 05/07/2021   Pneumococcal-Unspecified 11/01/2007   Td 11/01/2007    TDAP status: Due, Education has been provided regarding the importance of this vaccine. Advised may receive this vaccine at local pharmacy or Health Dept. Aware to provide a copy of the vaccination record if obtained from local pharmacy or Health Dept. Verbalized acceptance and  understanding.  Flu Vaccine status: Up  to date  Pneumococcal vaccine status: Up to date  Covid-19 vaccine status: Declined, Education has been provided regarding the importance of this vaccine but patient still declined. Advised may receive this vaccine at local pharmacy or Health Dept.or vaccine clinic. Aware to provide a copy of the vaccination record if obtained from local pharmacy or Health Dept. Verbalized acceptance and understanding.  Qualifies for Shingles Vaccine? Yes   Zostavax completed No   Shingrix Completed?: No.    Education has been provided regarding the importance of this vaccine. Patient has been advised to call insurance company to determine out of pocket expense if they have not yet received this vaccine. Advised may also receive vaccine at local pharmacy or Health Dept. Verbalized acceptance and understanding.  Screening Tests Health Maintenance  Topic Date Due   COVID-19 Vaccine (1) Never done   Zoster Vaccines- Shingrix (1 of 2) Never done   TETANUS/TDAP  10/31/2017   COLONOSCOPY (Pts 45-30yrs Insurance coverage will need to be confirmed)  02/03/2022 (Originally 09/21/1999)   MAMMOGRAM  06/22/2022 (Originally 09/20/2004)   DEXA SCAN  06/22/2022 (Originally 09/21/2019)   Pneumonia Vaccine 25+ Years old  Completed   INFLUENZA VACCINE  Completed   Hepatitis C Screening  Addressed   HPV VACCINES  Aged Out    Health Maintenance  Health Maintenance Due  Topic Date Due   COVID-19 Vaccine (1) Never done   Zoster Vaccines- Shingrix (1 of 2) Never done   TETANUS/TDAP  10/31/2017    Colonoscopy  declined  Mammogram declined  Bone density declined  Lung Cancer Screening: (Low Dose CT Chest recommended if Age 72-80 years, 30 pack-year currently smoking OR have quit w/in 15years.) does qualify.   Lung Cancer Screening Referral: has an order in  Additional Screening:  Hepatitis C Screening: does qualify;  Vision Screening: Recommended annual ophthalmology exams  for early detection of glaucoma and other disorders of the eye. Is the patient up to date with their annual eye exam?  No  Who is the provider or what is the name of the office in which the patient attends annual eye exams? [facility] If pt is not established with a provider, would they like to be referred to a provider to establish care? No .   Dental Screening: Recommended annual dental exams for proper oral hygiene  Community Resource Referral / Chronic Care Management: CRR required this visit?  No   CCM required this visit?  No      Plan:     I have personally reviewed and noted the following in the patients chart:   Medical and social history Use of alcohol, tobacco or illicit drugs  Current medications and supplements including opioid prescriptions.  Functional ability and status Nutritional status Physical activity Advanced directives List of other physicians Hospitalizations, surgeries, and ER visits in previous 12 months Vitals Screenings to include cognitive, depression, and falls Referrals and appointments  In addition, I have reviewed and discussed with patient certain preventive protocols, quality metrics, and best practice recommendations. A written personalized care plan for preventive services as well as general preventive health recommendations were provided to patient.     Leroy Kennedy, LPN   [id number]   Nurse Notes:

## 2021-06-22 NOTE — Patient Instructions (Signed)
Caitlyn May , Thank you for taking time to come for your Medicare Wellness Visit. I appreciate your ongoing commitment to your health goals. Please review the following plan we discussed and let me know if I can assist you in the future.   Screening recommendations/referrals: Colonoscopy: Education provided Mammogram: Education provided Bone Density: Education provided Recommended yearly ophthalmology/optometry visit for glaucoma screening and checkup Recommended yearly dental visit for hygiene and checkup  Vaccinations: Influenza vaccine: Education provided Pneumococcal vaccine: up to date Tdap vaccine: Education provided Shingles vaccine: Education provided    Advanced directives: Education provided  Conditions/risks identified:   Next appointment: 06-25-2021  @ 8:20  Lab      11-05-2021 @ 8:20  [facility] 40 Years and Older, Female Preventive care refers to lifestyle choices and visits with your health care provider that can promote health and wellness. What does preventive care include? A yearly physical exam. This is also called an annual well check. Dental exams once or twice a year. Routine eye exams. Ask your health care provider how often you should have your eyes checked. Personal lifestyle choices, including: Daily care of your teeth and gums. Regular physical activity. Eating a healthy diet. Avoiding tobacco and drug use. Limiting alcohol use. Practicing safe sex. Taking low-dose aspirin every day. Taking vitamin and mineral supplements as recommended by your health care provider. What happens during an annual well check? The services and screenings done by your health care provider during your annual well check will depend on your age, overall health, lifestyle risk factors, and family history of disease. Counseling  Your health care provider may ask you questions about your: Alcohol use. Tobacco use. Drug use. Emotional well-being. Home and  relationship well-being. Sexual activity. Eating habits. History of falls. Memory and ability to understand (cognition). Work and work Statistician. Reproductive health. Screening  You may have the following tests or measurements: Height, weight, and BMI. Blood pressure. Lipid and cholesterol levels. These may be checked every 5 years, or more frequently if you are over 56 years old. Skin check. Lung cancer screening. You may have this screening every year starting at age 3 if you have a 30-pack-year history of smoking and currently smoke or have quit within the past 15 years. Fecal occult blood test (FOBT) of the stool. You may have this test every year starting at age 59. Flexible sigmoidoscopy or colonoscopy. You may have a sigmoidoscopy every 5 years or a colonoscopy every 10 years starting at age 9. Hepatitis C blood test. Hepatitis B blood test. Sexually transmitted disease (STD) testing. Diabetes screening. This is done by checking your blood sugar (glucose) after you have not eaten for a while (fasting). You may have this done every 1-3 years. Bone density scan. This is done to screen for osteoporosis. You may have this done starting at age 34. Mammogram. This may be done every 1-2 years. Talk to your health care provider about how often you should have regular mammograms. Talk with your health care provider about your test results, treatment options, and if necessary, the need for more tests. Vaccines  Your health care provider may recommend certain vaccines, such as: Influenza vaccine. This is recommended every year. Tetanus, diphtheria, and acellular pertussis (Tdap, Td) vaccine. You may need a Td booster every 10 years. Zoster vaccine. You may need this after age 43. Pneumococcal 13-valent conjugate (PCV13) vaccine. One dose is recommended after age 18. Pneumococcal polysaccharide (PPSV23) vaccine. One dose is recommended after age  8. Talk to your health care provider  about which screenings and vaccines you need and how often you need them. This information is not intended to replace advice given to you by your health care provider. Make sure you discuss any questions you have with your health care provider. Document Released: 06/12/2015 Document Revised: 02/03/2016 Document Reviewed: 03/17/2015 Elsevier Interactive Patient Education  2017 [place] Prevention in the Home Falls can cause injuries. They can happen to people of all ages. There are many things you can do to make your home safe and to help prevent falls. What can I do on the outside of my home? Regularly fix the edges of walkways and driveways and fix any cracks. Remove anything that might make you trip as you walk through a door, such as a raised step or threshold. Trim any bushes or trees on the path to your home. Use bright outdoor lighting. Clear any walking paths of anything that might make someone trip, such as rocks or tools. Regularly check to see if handrails are loose or broken. Make sure that both sides of any steps have handrails. Any raised decks and porches should have guardrails on the edges. Have any leaves, snow, or ice cleared regularly. Use sand or salt on walking paths during winter. Clean up any spills in your garage right away. This includes oil or grease spills. What can I do in the bathroom? Use night lights. Install grab bars by the toilet and in the tub and shower. Do not use towel bars as grab bars. Use non-skid mats or decals in the tub or shower. If you need to sit down in the shower, use a plastic, non-slip stool. Keep the floor dry. Clean up any water that spills on the floor as soon as it happens. Remove soap buildup in the tub or shower regularly. Attach bath mats securely with double-sided non-slip rug tape. Do not have throw rugs and other things on the floor that can make you trip. What can I do in the bedroom? Use night lights. Make sure  that you have a light by your bed that is easy to reach. Do not use any sheets or blankets that are too big for your bed. They should not hang down onto the floor. Have a firm chair that has side arms. You can use this for support while you get dressed. Do not have throw rugs and other things on the floor that can make you trip. What can I do in the kitchen? Clean up any spills right away. Avoid walking on wet floors. Keep items that you use a lot in easy-to-reach places. If you need to reach something above you, use a strong step stool that has a grab bar. Keep electrical cords out of the way. Do not use floor polish or wax that makes floors slippery. If you must use wax, use non-skid floor wax. Do not have throw rugs and other things on the floor that can make you trip. What can I do with my stairs? Do not leave any items on the stairs. Make sure that there are handrails on both sides of the stairs and use them. Fix handrails that are broken or loose. Make sure that handrails are as long as the stairways. Check any carpeting to make sure that it is firmly attached to the stairs. Fix any carpet that is loose or worn. Avoid having throw rugs at the top or bottom of the stairs. If you do have  throw rugs, attach them to the floor with carpet tape. Make sure that you have a light switch at the top of the stairs and the bottom of the stairs. If you do not have them, ask someone to add them for you. What else can I do to help prevent falls? Wear shoes that: Do not have high heels. Have rubber bottoms. Are comfortable and fit you well. Are closed at the toe. Do not wear sandals. If you use a stepladder: Make sure that it is fully opened. Do not climb a closed stepladder. Make sure that both sides of the stepladder are locked into place. Ask someone to hold it for you, if possible. Clearly mark and make sure that you can see: Any grab bars or handrails. First and last steps. Where the edge of  each step is. Use tools that help you move around (mobility aids) if they are needed. These include: Canes. Walkers. Scooters. Crutches. Turn on the lights when you go into a dark area. Replace any light bulbs as soon as they burn out. Set up your furniture so you have a clear path. Avoid moving your furniture around. If any of your floors are uneven, fix them. If there are any pets around you, be aware of where they are. Review your medicines with your doctor. Some medicines can make you feel dizzy. This can increase your chance of falling. Ask your doctor what other things that you can do to help prevent falls. This information is not intended to replace advice given to you by your health care provider. Make sure you discuss any questions you have with your health care provider. Document Released: 03/12/2009 Document Revised: 10/22/2015 Document Reviewed: 06/20/2014 Elsevier Interactive Patient Education  2017 [employer].

## 2021-06-23 ENCOUNTER — Telehealth: Payer: Self-pay | Admitting: *Deleted

## 2021-06-23 NOTE — Telephone Encounter (Signed)
-----   Message from Rise Mu, [place] sent at 06/23/2021  7:42 AM EST ----- Carotid artery ultrasound showed no evidence of significant stenosis within the bilateral internal carotid arteries.  Please see echo prior to contacting patient.

## 2021-06-23 NOTE — Telephone Encounter (Signed)
Left voicemail message to call back for review of results.  

## 2021-06-23 NOTE — Telephone Encounter (Signed)
-----   Message from Rise Mu, PA-C sent at 06/23/2021  7:42 AM EST ----- Echo showed normal pump function, normal wall motion, mildly thickened and stiffened heart, normal pressure within the right side of the heart, and no significant valvular abnormalities.  Overall reassuring study.  Please see carotid result note prior to contacting patient.

## 2021-06-24 ENCOUNTER — Ambulatory Visit: Payer: Self-pay | Admitting: *Deleted

## 2021-06-24 NOTE — Telephone Encounter (Signed)
°  Chief Complaint: abdominal pain/cramping , diarrhea, nausea .  Symptoms: abd. Pain, cramping , nausea, diarrhea, fever at times. Poor po intake  c/o fever 99.7 at 2 am now 97.4. unable to tolerate food or fluids . Did keep pedialyte down for 3-4 hours . Watery diarrhea reported . Frequency: 06/22/21 Pertinent Negatives: Patient denies severe pain, fever now , no vomiting, no blood in stool Disposition: [] ED /[x] Urgent Care (no appt availability in office) / [] Appointment(In office/virtual)/ [x]  [facility]/ [] Home Care/ [x] Refused Recommended Disposition /[] [facility]/ []  Follow-up with PCP Additional Notes:  Patient reports she had to cancel lab work due to diarrhea and abd. Pain. Declined appt in office . Recommended E visit . No my chart access.       Reason for Disposition  [1] MILD pain (e.g., does not interfere with normal activities) AND [2] pain comes and goes (cramps) AND [3] present > 48 hours  (Exception: this same abdominal pain is a chronic symptom recurrent or ongoing AND present > 4 weeks)  Answer Assessment - Initial Assessment Questions 1. LOCATION: "Where does it hurt?"      Abdominal pain / cramping  2. RADIATION: "Does the pain shoot anywhere else?" (e.g., chest, back)     No  3. ONSET: "When did the pain begin?" (e.g., minutes, hours or days ago)      Tuesday 06/22/21 4. SUDDEN: "Gradual or sudden onset?"     na 5. PATTERN "Does the pain come and go, or is it constant?"    - If constant: "Is it getting better, staying the same, or worsening?"      (Note: Constant means the pain never goes away completely; most serious pain is constant and it progresses)     - If intermittent: "How long does it last?" "Do you have pain now?"     (Note: Intermittent means the pain goes away completely between bouts)     Comes and goes  6. SEVERITY: "How bad is the pain?"  (e.g., Scale 1-10; mild, moderate, or severe)   - MILD (1-3): doesn't interfere with  normal activities, abdomen soft and not tender to touch    - MODERATE (4-7): interferes with normal activities or awakens from sleep, abdomen tender to touch    - SEVERE (8-10): excruciating pain, doubled over, unable to do any normal activities      Mild to moderate abd. Not tender to touch 7. RECURRENT SYMPTOM: "Have you ever had this type of stomach pain before?" If Yes, ask: "When was the last time?" and "What happened that time?"      na 8. CAUSE: "What do you think is causing the stomach pain?"     Not sure  9. RELIEVING/AGGRAVATING FACTORS: "What makes it better or worse?" (e.g., movement, antacids, bowel movement)     No  10. OTHER SYMPTOMS: "Do you have any other symptoms?" (e.g., back pain, diarrhea, fever, urination pain, vomiting)       Abdominal pain , diarrhea, fever this am not now , nausea 11. PREGNANCY: "Is there any chance you are pregnant?" "When was your last menstrual period?"       na  Protocols used: Abdominal Pain - [facility]

## 2021-06-24 NOTE — Telephone Encounter (Signed)
Spoke with pt and she states she can not come in office. Scheduled telephone visit with Jolene.

## 2021-06-24 NOTE — Telephone Encounter (Signed)
Reviewed results and recommendations with patient. She verbalized understanding with no further questions at this time.  ?

## 2021-06-24 NOTE — Telephone Encounter (Signed)
I recommend patient be seen in the office.  However, she does not want to see me so it shouldn't be made with me.

## 2021-06-25 ENCOUNTER — Other Ambulatory Visit: Payer: Medicare HMO

## 2021-06-28 ENCOUNTER — Encounter: Payer: Self-pay | Admitting: Nurse Practitioner

## 2021-06-28 ENCOUNTER — Ambulatory Visit (INDEPENDENT_AMBULATORY_CARE_PROVIDER_SITE_OTHER): Payer: Medicare HMO | Admitting: Nurse Practitioner

## 2021-06-28 VITALS — BP 129/80 | HR 76

## 2021-06-28 DIAGNOSIS — R197 Diarrhea, unspecified: Secondary | ICD-10-CM | POA: Diagnosis not present

## 2021-06-28 NOTE — Progress Notes (Signed)
BP 129/80    Pulse 76    Subjective:    Patient ID: Caitlyn May, female    DOB: Sep 16, 1955, 66 y.o.   MRN: [id number]  HPI: Caitlyn May is a 66 y.o. female  Chief Complaint  Patient presents with   Abdominal Pain    Patient states she does not want to say stomach pain, but she is having a crampy sensation.    Diarrhea    Patient states she started to have symptoms last Tuesday. Patient states she has tried over the counter medications. Patient states she thinks that the only thing that has helped her has been Pedialyte. Patient states she cooked a meal and states she did not use butter and states it was just gland and she was able to keep that down. Patient denies having any vomiting.    This visit was completed via telephone due to the restrictions of the COVID-19 pandemic. All issues as above were discussed and addressed but no physical exam was performed. If it was felt that the patient should be evaluated in the office, they were directed there. The patient verbally consented to this visit. Patient was unable to complete an audio/visual visit due to Lack of equipment. Due to the catastrophic nature of the COVID-19 pandemic, this visit was done through audio contact only. Location of the patient: home Location of the provider: work Those involved with this call:  Provider: Marnee Guarneri, DNP CMA: Irena Reichmann, [place] Desk/Registration: [employer]  Time spent on call:  21 minutes on the phone discussing health concerns. 15 minutes total spent in review of patient's record and preparation of their chart. I verified patient identity using two factors (patient name and date of birth). Patient consents verbally to being seen via telemedicine visit today.     ABDOMINAL PAIN  Started 06/22/21 with symptoms -- was over at hospital and having testing on carotid artery and chest, left went in parking lot -- cranked her truck, put it in drive and all a sudden stomach issues  hit her and could not control bowels, had diarrhea in truck.  Did not take any medications or prep for testing.  She does not recall what she ate prior to illness starting.  Has not been around anyone that has been sick that she knows of.  Temperature of 99.1 highest, but two times has woken up soaking up -- thinks she had fever, Sunday morning is last time this happened.  Has been able to keep chicken and rice down.  Never had issues like this before.  She is a smoker.  Recent labs had a TSH 5.700 -- she is coming this Wednesday for recheck.  No recent antibiotic use.  Still has gall bladder. Duration:days Onset: sudden Severity: 4/10 Quality: dull, aching, and cramping Location:  diffuse  Episode duration: 30 minutes or more Radiation: no Frequency: intermittent Alleviating factors: Imodium, Pedialyte Aggravating factors: nothing Status: fluctuating Treatments attempted: PPI Fever: yes Nausea: no Vomiting: no Weight loss: no Decreased appetite:  not decreased, just scared to eat Diarrhea: yes Constipation: no Blood in stool: no Heartburn: no Jaundice: no Rash: no Dysuria/urinary frequency: no Hematuria: no History of sexually transmitted disease: no Recurrent NSAID use: no  Relevant past medical, surgical, family and social history reviewed and updated as indicated. Interim medical history since our last visit reviewed. Allergies and medications reviewed and updated.  Review of Systems  Constitutional:  Positive for fatigue. Negative for activity change, appetite change, diaphoresis and fever.  Respiratory:  Negative for cough, chest tightness and shortness of breath.   Cardiovascular:  Negative for chest pain, palpitations and leg swelling.  Gastrointestinal:  Positive for abdominal pain and diarrhea. Negative for abdominal distention, constipation, nausea and vomiting.  Endocrine: Negative for cold intolerance and heat intolerance.  Neurological: Negative.    Psychiatric/Behavioral: Negative.     Per HPI unless specifically indicated above     Objective:    BP 129/80    Pulse 76   Wt Readings from Last 3 Encounters:  05/07/21 113 lb 6.4 oz (51.4 kg)  04/12/21 117 lb (53.1 kg)  02/03/21 113 lb (51.3 kg)    Physical Exam  Unable to perform due to telephone visit only.  Results for orders placed or performed in visit on 06/22/21  ECHOCARDIOGRAM COMPLETE  Result Value Ref Range   AR max vel 2.28 cm2   AV Peak grad 5.2 mmHg   Ao pk vel 1.14 m/s   S' Lateral 2.00 cm   Area-P 1/2 1.98 cm2   AV Area VTI 2.37 cm2   AV Mean grad 3.0 mmHg   Single Plane A4C EF 52.1 %   AV Area mean vel 2.23 cm2      Assessment & Plan:   Problem List Items Addressed This Visit       Other   Diarrhea - Primary    Acute x 1 one week -- no N&V.  Suspect gastroenteritis at this point, she is scheduled for labs on Wednesday to recheck TSH and Free T4, will add on CBC, CMP, and stool testing.  At this time recommend she take Imodium only as needed, as goal is to take minimally and allow illness to pass.  Ensure ongoing hydration with Pedialyte to prevent dehydration and recommend BLAND diet at home, no heavy/greasy food or fast food.  If any worsening symptoms prior to follow-up at end of week, then head into ER as may need IV fluid hydration if worsening.  Will plan for follow-up in office at end of this week with her PCP.      Relevant Orders   Comprehensive metabolic panel   CBC with Differential/Platelet   Cdiff NAA+O+P+Stool Culture    I discussed the assessment and treatment plan with the patient. The patient was provided an opportunity to ask questions and all were answered. The patient agreed with the plan and demonstrated an understanding of the instructions.   The patient was advised to call back or seek an in-person evaluation if the symptoms worsen or if the condition fails to improve as anticipated.   I provided 21+ minutes of time during  this encounter.   Follow up plan: Return in about 3 days (around 07/01/2021) for Follow-up in office in 3 days with PCP.

## 2021-06-28 NOTE — Assessment & Plan Note (Signed)
Acute x 1 one week -- no N&V.  Suspect gastroenteritis at this point, she is scheduled for labs on Wednesday to recheck TSH and Free T4, will add on CBC, CMP, and stool testing.  At this time recommend she take Imodium only as needed, as goal is to take minimally and allow illness to pass.  Ensure ongoing hydration with Pedialyte to prevent dehydration and recommend BLAND diet at home, no heavy/greasy food or fast food.  If any worsening symptoms prior to follow-up at end of week, then head into ER as may need IV fluid hydration if worsening.  Will plan for follow-up in office at end of this week with her PCP.

## 2021-06-28 NOTE — Patient Instructions (Signed)
Diarrhea, Adult °Diarrhea is when you pass loose and watery poop (stool) often. Diarrhea can make you feel weak and cause you to lose water in your body (get dehydrated). Losing water in your body can cause you to: °Feel tired and thirsty. °Have a dry mouth. °Go pee (urinate) less often. °Diarrhea often lasts 2-3 days. However, it can last longer if it is a sign of something more serious. It is important to treat your diarrhea as told by your doctor. °Follow these instructions at home: °Eating and drinking °  °Follow these instructions as told by your doctor: °Take an ORS (oral rehydration solution). This is a drink that helps you replace fluids and minerals your body lost. It is sold at pharmacies and stores. °Drink plenty of fluids, such as: °Water. °Ice chips. °Diluted fruit juice. °Low-calorie sports drinks. °Milk, if you want. °Avoid drinking fluids that have a lot of sugar or caffeine in them. °Eat bland, easy-to-digest foods in small amounts as you are able. These foods include: °Bananas. °Applesauce. °Rice. °Low-fat (lean) meats. °Toast. °Crackers. °Avoid alcohol. °Avoid spicy or fatty foods. ° °Medicines °Take over-the-counter and prescription medicines only as told by your doctor. °If you were prescribed an antibiotic medicine, take it as told by your doctor. Do not stop using the antibiotic even if you start to feel better. °General instructions ° °Wash your hands often using soap and water. If soap and water are not available, use a hand sanitizer. Others in your home should wash their hands as well. Hands should be washed: °After using the toilet or changing a diaper. °Before preparing, cooking, or serving food. °While caring for a sick person. °While visiting someone in a hospital. °Drink enough fluid to keep your pee (urine) pale yellow. °Rest at home while you get better. °Take a warm bath to help with any burning or pain from having diarrhea. °Watch your condition for any changes. °Keep all  follow-up visits as told by your doctor. This is important. °Contact a doctor if: °You have a fever. °Your diarrhea gets worse. °You have new symptoms. °You cannot keep fluids down. °You feel light-headed or dizzy. °You have a headache. °You have muscle cramps. °Get help right away if: °You have chest pain. °You feel very weak or you pass out (faint). °You have bloody or black poop or poop that looks like tar. °You have very bad pain, cramping, or bloating in your belly (abdomen). °You have trouble breathing or you are breathing very quickly. °Your heart is beating very quickly. °Your skin feels cold and clammy. °You feel confused. °You have signs of losing too much water in your body, such as: °Dark pee, very little pee, or no pee. °Cracked lips. °Dry mouth. °Sunken eyes. °Sleepiness. °Weakness. °Summary °Diarrhea is when you pass loose and watery poop (stool) often. °Diarrhea can make you feel weak and cause you to lose water in your body (get dehydrated). °Take an ORS (oral rehydration solution). This is a drink that is sold at pharmacies and stores. °Eat bland, easy-to-digest foods in small amounts as you are able. °Contact a doctor if your condition gets worse. Get help right away if you have signs that you have lost too much water in your body. °This information is not intended to replace advice given to you by your health care provider. Make sure you discuss any questions you have with your health care provider. °Document Revised: 11/25/2020 Document Reviewed: 11/25/2020 °Elsevier Patient Education © 2022 [employer] Inc[employer] ° °

## 2021-06-28 NOTE — Progress Notes (Signed)
Lmom asking pt to call back to schedule appt

## 2021-06-29 ENCOUNTER — Ambulatory Visit: Payer: Medicare HMO | Admitting: Nurse Practitioner

## 2021-06-29 NOTE — Progress Notes (Signed)
2nd attempt- Lmom asking pt to call back to schedule fu visit

## 2021-06-30 ENCOUNTER — Other Ambulatory Visit: Payer: Self-pay

## 2021-06-30 ENCOUNTER — Other Ambulatory Visit: Payer: Medicare HMO

## 2021-06-30 DIAGNOSIS — R7989 Other specified abnormal findings of blood chemistry: Secondary | ICD-10-CM | POA: Diagnosis not present

## 2021-06-30 DIAGNOSIS — R197 Diarrhea, unspecified: Secondary | ICD-10-CM | POA: Diagnosis not present

## 2021-07-01 LAB — CBC WITH DIFFERENTIAL/PLATELET
Basophils Absolute: 0.1 10*3/uL (ref 0.0–0.2)
Basos: 1 %
EOS (ABSOLUTE): 0.1 10*3/uL (ref 0.0–0.4)
Eos: 1 %
Hematocrit: 42.2 % (ref 34.0–46.6)
Hemoglobin: 14.3 g/dL (ref 11.1–15.9)
Immature Grans (Abs): 0 10*3/uL (ref 0.0–0.1)
Immature Granulocytes: 0 %
Lymphocytes Absolute: 2.2 10*3/uL (ref 0.7–3.1)
Lymphs: 23 %
MCH: 32.3 pg (ref 26.6–33.0)
MCHC: 33.9 g/dL (ref 31.5–35.7)
MCV: 95 fL (ref 79–97)
Monocytes Absolute: 0.8 10*3/uL (ref 0.1–0.9)
Monocytes: 8 %
Neutrophils Absolute: 6.3 10*3/uL (ref 1.4–7.0)
Neutrophils: 67 %
Platelets: 233 10*3/uL (ref 150–450)
RBC: 4.43 x10E6/uL (ref 3.77–5.28)
RDW: 12.1 % (ref 11.7–15.4)
WBC: 9.4 10*3/uL (ref 3.4–10.8)

## 2021-07-01 LAB — COMPREHENSIVE METABOLIC PANEL
ALT: 15 IU/L (ref 0–32)
AST: 16 IU/L (ref 0–40)
Albumin/Globulin Ratio: 1.9 (ref 1.2–2.2)
Albumin: 4.2 g/dL (ref 3.8–4.8)
Alkaline Phosphatase: 76 IU/L (ref 44–121)
BUN/Creatinine Ratio: 16 (ref 12–28)
BUN: 15 mg/dL (ref 8–27)
Bilirubin Total: <0.2 mg/dL (ref 0.0–1.2)
CO2: 22 mmol/L (ref 20–29)
Calcium: 9.1 mg/dL (ref 8.7–10.3)
Chloride: 105 mmol/L (ref 96–106)
Creatinine, Ser: 0.91 mg/dL (ref 0.57–1.00)
Globulin, Total: 2.2 g/dL (ref 1.5–4.5)
Glucose: 84 mg/dL (ref 70–99)
Potassium: 3.4 mmol/L — ABNORMAL LOW (ref 3.5–5.2)
Sodium: 143 mmol/L (ref 134–144)
Total Protein: 6.4 g/dL (ref 6.0–8.5)
eGFR: 70 mL/min/{1.73_m2} (ref >59)

## 2021-07-01 LAB — T4, FREE: Free T4: 1.17 ng/dL (ref 0.82–1.77)

## 2021-07-01 LAB — TSH: TSH: 2.68 u[IU]/mL (ref 0.450–4.500)

## 2021-07-01 NOTE — Progress Notes (Signed)
Please let Caitlyn May know that all current labs look great, including thyroid.  Potassium is only thing a little off, it is a little low -- recommend adding some potassium rich foods to diet, including bananas, spinach, mango, dried fruit.  Stool sample is not returned yet.  Any questions?

## 2021-07-02 DIAGNOSIS — M5416 Radiculopathy, lumbar region: Secondary | ICD-10-CM | POA: Diagnosis not present

## 2021-07-02 DIAGNOSIS — M5412 Radiculopathy, cervical region: Secondary | ICD-10-CM | POA: Diagnosis not present

## 2021-07-05 ENCOUNTER — Ambulatory Visit: Payer: Medicare HMO | Admitting: Internal Medicine

## 2021-07-12 ENCOUNTER — Ambulatory Visit: Payer: Medicare HMO | Admitting: Internal Medicine

## 2021-07-14 ENCOUNTER — Ambulatory Visit: Payer: Medicare HMO | Admitting: Nurse Practitioner

## 2021-07-16 ENCOUNTER — Telehealth: Payer: Self-pay | Admitting: *Deleted

## 2021-07-16 NOTE — Telephone Encounter (Signed)
Left voicemail message to call back for review of testing and upcoming appointment that may need to be rescheduled.

## 2021-07-16 NOTE — Telephone Encounter (Signed)
-----   Message from Britt Bottom, [place] sent at 07/15/2021  2:31 PM EST ----- Pt has upcoming appointment with [facility] 2/23. Pt f/u echo, carotid, CT and Zio.  CT hasn't been scheduled and zio repost hasn't been uploaded. Please advise if ok to keep future appointment. Thank you

## 2021-07-19 NOTE — Telephone Encounter (Signed)
Left voicemail message to call back for review of testing and upcoming appointment.

## 2021-07-20 NOTE — Telephone Encounter (Signed)
Left voicemail message to call back in regards to her upcoming appointment and testing that are still pending.

## 2021-07-21 NOTE — Telephone Encounter (Signed)
Left voicemail message to call back in regards to her testing and appointment.

## 2021-07-21 NOTE — Telephone Encounter (Signed)
Patient returned call in regards to her testing and appointment. She has not mailed her zio back and will take care of that today. She also needs to reschedule her CT and provided her with that number to call and arrange to have it done. Appointment also rescheduled so we can get results back. She was appreciative for the call with no further questions at this time.

## 2021-07-22 ENCOUNTER — Ambulatory Visit: Payer: Medicare HMO | Admitting: Nurse Practitioner

## 2021-08-13 ENCOUNTER — Ambulatory Visit: Payer: Medicare HMO

## 2021-08-19 NOTE — Progress Notes (Deleted)
? ?Cardiology Office Note   ? ?Date:  08/19/2021  ? ?IDJalan May, DOB 1956-01-08, MRN [id number] ? ?PCP:  Venita Lick, NP  ?Cardiologist:  Kate Sable, MD  ?Electrophysiologist:  None  ? ?Chief Complaint: Follow-up ? ?History of Present Illness:  ? ?Caitlyn May is a 66 y.o. female with history of syncope presumed to be vasovagal, ectatic ascending aorta measuring 3.4 cm by CT in 07/2019, HTN, HLD, emphysema, tobacco use, panic attacks, and chronic neck and back pain on narcotic pain medication who presents for follow-up of ectatic ascending aorta. ?  ?She was previously evaluated by Dr. 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  Episodes were felt to be possibly neurocardiogenic along with possible significant fluctuation in her blood pressure.  There were no ischemic EKG changes.  Echo at that time demonstrated an EF of 60 to 65%, normal wall motion, normal LV diastolic function parameters, normal-sized left atrium, normal RV systolic function, normal PASP, and normal-sized aortic root.  Carotid artery ultrasound showed only mild plaque bilaterally. ?  ?CT chest with contrast performed in 07/2019, for dyspnea, unintentional weight loss, and right-sided chest discomfort showed no acute cardiopulmonary process, mass, or adenopathy.  There was mild ectasia of the ascending thoracic aorta measuring 3.4 cm, along with emphysema, aortic atherosclerosis, and hypodensities within the liver that were too small to characterize as well as unchanged renal cysts. ?  ?She was lost to follow-up until being evaluated by Dr. Garen Lah in 05/2020 for hypertension. ?  ?Phone note from 09/2020 noting a syncopal episode during a panic attack. ? ?She was last seen in the office in 03/2021 and was doing well from a cardiac perspective without symptoms of angina or decompensation.  She reported intermittent syncopal episodes,  typically associated with stress and panic attacks.  There were no associated symptoms with these episodes.  She reported a prior extensive neurology work-up for these events, which was unavailable for review.  She did continue to smoke and had decreased to 1/2 pack to 1 pack daily.  Zio patch was recommended and remains pending at this time.  CTA aorta was also recommended and remains pending as well. ? ?*** ? ? ?Labs independently reviewed: ?06/2021 - TSH normal, BUN 15, serum creatinine 0.91, potassium 3.4, albumin 4.2, AST/ALT normal, Hgb 14.3, PLT 233 ?04/2021 - magnesium 1.8, TC 154, TG 133, HDL 44, LDL 86 ? ?Past Medical History:  ?Diagnosis Date  ? Acute rhinosinusitis 07/23/2019  ? Chronic kidney disease   ? Depression   ? Failed back syndrome, lumbar   ? Hyperlipidemia   ? Hypertension   ? Osteoporosis   ? Weight loss   ? ? ?Past Surgical History:  ?Procedure Laterality Date  ? ABDOMINAL HYSTERECTOMY    ? CESAREAN SECTION    ? MOUTH SURGERY    ? SPINE SURGERY    ? ? ?Current Medications: ?No outpatient medications have been marked as taking for the 08/20/21 encounter (Appointment) with Rise Mu, PA-C.  ? ? ?Allergies:   Accupril [quinapril hcl], Avelox [moxifloxacin], Ciprofloxacin, Hctz [hydrochlorothiazide], Penicillins, and Sulfa antibiotics  ? ?Social History  ? ?Socioeconomic History  ? Marital status: Married  ?  Spouse name: Not on file  ? Number of children: Not on file  ? Years of education: 10  ? Highest education level: 10th grade  ?Occupational History  ? Occupation: disability  ?Tobacco Use  ? Smoking  status: Every Day  ?  Packs/day: 1.00  ?  Types: Cigarettes  ? Smokeless tobacco: Never  ?Vaping Use  ? Vaping Use: Never used  ?Substance and Sexual Activity  ? Alcohol use: No  ? Drug use: Not Currently  ?  Types: Marijuana  ?  Comment: last week estimated 05/10/2019  ? Sexual activity: Not on file  ?Other Topics Concern  ? Not on file  ?Social History Narrative  ? Not on file  ? ?Social  Determinants of Health  ? ?Financial Resource Strain: Low Risk   ? Difficulty of Paying Living Expenses: Not hard at all  ?Food Insecurity: No Food Insecurity  ? Worried About Charity fundraiser in the Last Year: Never true  ? Ran Out of Food in the Last Year: Never true  ?Transportation Needs: No Transportation Needs  ? Lack of Transportation (Medical): No  ? Lack of Transportation (Non-Medical): No  ?Physical Activity: Inactive  ? Days of Exercise per Week: 0 days  ? Minutes of Exercise per Session: 0 min  ?Stress: Stress Concern Present  ? Feeling of Stress : To some extent  ?Social Connections: Unknown  ? Frequency of Communication with Friends and Family: Once a week  ? Frequency of Social Gatherings with Friends and Family: Once a week  ? Attends Religious Services: Never  ? Active Member of Clubs or Organizations: No  ? Attends Archivist Meetings: Never  ? Marital Status: Not on file  ?  ? ?Family History:  ?The patient's family history includes Cancer in her father and maternal aunt; Dementia in her mother; Hypertension in her mother. There is no history of Diabetes, Heart disease, Stroke, or COPD. ? ?ROS:   ?ROS ? ? ?EKGs/Labs/Other Studies Reviewed:   ? ?Studies reviewed were summarized above. The additional studies were reviewed today: ? ?Carotid artery ultrasound 06/22/2021: ?Summary:  ?Right Carotid: There was no evidence of thrombus, dissection,  ?atherosclerotic plaque or stenosis in the cervical carotid system.  ? ?Left Carotid: There was no evidence of thrombus, dissection,  ?atherosclerotic plaque or stenosis in the cervical carotid system.  ? ?Vertebrals:  Bilateral vertebral arteries demonstrate antegrade flow.  ?Subclavians: Normal flow hemodynamics were seen in bilateral subclavian arteries.  ?__________ ? ?2D echo 06/22/2021:1. Left ventricular ejection fraction, by estimation, is 60 to 65%. The  ?left ventricle has normal function. The left ventricle has no regional  ?wall motion  abnormalities. There is mild left ventricular hypertrophy.  ?Left ventricular diastolic parameters  ?are consistent with Grade I diastolic dysfunction (impaired relaxation).  ? 2. Right ventricular systolic function is normal. The right ventricular  ?size is normal. There is normal pulmonary artery systolic pressure. The  ?estimated right ventricular systolic pressure is 23.7 mmHg.  ? 3. The mitral valve is normal in structure. No evidence of mitral valve  ?regurgitation. No evidence of mitral stenosis.  ? 4. The aortic valve was not well visualized. Aortic valve regurgitation  ?is not visualized. No aortic stenosis is present.  ? 5. The inferior vena cava is normal in size with greater than 50%  ?respiratory variability, suggesting right atrial pressure of 3 mmHg. ?__________ ? ?Zio patch 03/2021: ?Not completed ?__________ ? ?CT chest with contrast 08/15/2019: ?IMPRESSION: ?1.  No acute cardiopulmonary disease.  No focal mass or adenopathy. ?2. Aortic Atherosclerosis (ICD10-I70.0) and Emphysema (ICD10-J43.9). ?Atherosclerotic coronary artery disease. ?3. Mild ectasia of the ascending thoracic aorta measuring 3.4 cm. ?Recommend annual imaging followup by CTA or MRA. This recommendation ?  follows 2010 ACCF/AHA/AATS/ACR/ASA/SCA/SCAI/SIR/STS/SVM Guidelines ?for the Diagnosis and Management of Patients with Thoracic Aortic ?Disease. Circulation.2010; 121: X672-W979. Aortic aneurysm NOS ?(ICD10-I71.9). ?4. Multiple subcentimeter liver hypodensities too small to ?characterize but likely cysts. Several cysts over the upper pole of ?the kidneys unchanged. ?__________ ? ?2D echo 01/12/2017: ?- Left ventricle: The cavity size was normal. Systolic function was  ?  normal. The estimated ejection fraction was in the range of 60%  ?  to 65%. Wall motion was normal; there were no regional wall  ?  motion abnormalities. Left ventricular diastolic function  ?  parameters were normal.  ?- Left atrium: The atrium was normal in size.  ?-  Right ventricle: Systolic function was normal.  ?- Pulmonary arteries: Systolic pressure was within the normal  ?  range.  ?- Normal size aortic root. ? ?EKG:  EKG is ordered today.  The EKG ordered today demonstra

## 2021-08-19 NOTE — Telephone Encounter (Signed)
It does not look like her CTA of the aorta was ever scheduled.  Please check into that.  Yes, we should see her tomorrow to reevaluate her symptoms and to discuss importance of ongoing monitoring. ?

## 2021-08-19 NOTE — Telephone Encounter (Signed)
Patient has still not completed her test or mailed her monitor back. She is scheduled to come in tomorrow. Would you like [place] to reschedule?  ?

## 2021-08-19 NOTE — Telephone Encounter (Signed)
Provider would like her to keep appointment.  ?

## 2021-08-20 ENCOUNTER — Ambulatory Visit: Payer: Medicare HMO | Admitting: Physician Assistant

## 2021-08-23 ENCOUNTER — Encounter: Payer: Self-pay | Admitting: Physician Assistant

## 2021-08-24 DIAGNOSIS — R300 Dysuria: Secondary | ICD-10-CM | POA: Diagnosis not present

## 2021-08-24 DIAGNOSIS — J0140 Acute pansinusitis, unspecified: Secondary | ICD-10-CM | POA: Diagnosis not present

## 2021-08-24 DIAGNOSIS — L292 Pruritus vulvae: Secondary | ICD-10-CM | POA: Diagnosis not present

## 2021-08-24 DIAGNOSIS — B3731 Acute candidiasis of vulva and vagina: Secondary | ICD-10-CM | POA: Diagnosis not present

## 2021-09-14 ENCOUNTER — Other Ambulatory Visit: Payer: Self-pay | Admitting: Nurse Practitioner

## 2021-09-14 NOTE — Telephone Encounter (Signed)
Requested Prescriptions  ?Pending Prescriptions Disp Refills  ?? gabapentin (NEURONTIN) 300 MG capsule [Pharmacy Med Name: GABAPENTIN '300MG'$  CAPSULES] 90 capsule 0  ?  Sig: TAKE ONE CAPSULE BY MOUTH THREE TIMES DAILY  ?  ? Neurology: Anticonvulsants - gabapentin Passed - 09/14/2021  4:23 PM  ?  ?  Passed - Cr in normal range and within 360 days  ?  Creatinine, Ser  ?Date Value Ref Range Status  ?06/30/2021 0.91 0.57 - 1.00 mg/dL Final  ?   ?  ?  Passed - Completed PHQ-2 or PHQ-9 in the last 360 days  ?  ?  Passed - Valid encounter within last 12 months  ?  Recent Outpatient Visits   ?      ? 2 months ago Diarrhea, unspecified type  ? [place], [place] T, NP  ? 4 months ago Centrilobular emphysema ([place])  ? [place], [place] T, NP  ? 5 months ago Viral upper respiratory tract infection  ? [place], NP  ? 7 months ago Thoracic aortic aneurysm without rupture [facility])  ? [place], NP  ? 9 months ago Aortic atherosclerosis ([place])  ? [facility] Jon Billings, NP  ?  ?  ? ?  ?  ?  ? ?

## 2021-09-23 ENCOUNTER — Other Ambulatory Visit: Payer: Self-pay | Admitting: Nurse Practitioner

## 2021-09-24 NOTE — Telephone Encounter (Signed)
Refilled 09/14/2021 #90 0 refills. ?Requested Prescriptions  ?Pending Prescriptions Disp Refills  ?? gabapentin (NEURONTIN) 300 MG capsule [Pharmacy Med Name: GABAPENTIN '300MG'$  CAPSULES] 90 capsule 0  ?  Sig: TAKE 1 CAPSULE BY MOUTH THREE TIMES DAILY  ?  ? Neurology: Anticonvulsants - gabapentin Passed - 09/23/2021  8:03 AM  ?  ?  Passed - Cr in normal range and within 360 days  ?  Creatinine, Ser  ?Date Value Ref Range Status  ?06/30/2021 0.91 0.57 - 1.00 mg/dL Final  ?   ?  ?  Passed - Completed PHQ-2 or PHQ-9 in the last 360 days  ?  ?  Passed - Valid encounter within last 12 months  ?  Recent Outpatient Visits   ?      ? 2 months ago Diarrhea, unspecified type  ? [place], [place] T, NP  ? 4 months ago Centrilobular emphysema ([place] [facility] [place])  ? [place], [place] T, NP  ? 6 months ago Viral upper respiratory tract infection  ? [place], NP  ? 7 months ago Thoracic aortic aneurysm without rupture [facility])  ? [place], NP  ? 9 months ago Aortic atherosclerosis ([place])  ? [facility] Jon Billings, NP  ?  ?  ? ?  ?  ?  ? ?

## 2021-09-24 NOTE — Telephone Encounter (Signed)
Requested Prescriptions  ?Pending Prescriptions Disp Refills  ?? atorvastatin (LIPITOR) 80 MG tablet [Pharmacy Med Name: ATORVASTATIN '80MG'$  TABLETS] 90 tablet 2  ?  Sig: TAKE 1 TABLET(80 MG TOTAL) BY MOUTH DAILY.  ?  ? Cardiovascular:  Antilipid - Statins Failed - 09/23/2021  8:03 AM  ?  ?  Failed - Lipid Panel in normal range within the last 12 months  ?  Cholesterol, Total  ?Date Value Ref Range Status  ?05/07/2021 154 100 - 199 mg/dL Final  ? ?Cholesterol [place], [place]  ?Date Value Ref Range Status  ?12/11/2017 163 <200 mg/dL Final  ?  Comment:  ?                          Desirable                <200 ?                        Borderline High      200- 239 ?                        High                     >239 ?  ? ?LDL Chol Calc (NIH)  ?Date Value Ref Range Status  ?05/07/2021 86 0 - 99 mg/dL Final  ? ?HDL  ?Date Value Ref Range Status  ?05/07/2021 44 >39 mg/dL Final  ? ?Triglycerides  ?Date Value Ref Range Status  ?05/07/2021 133 0 - 149 mg/dL Final  ? ?Triglycerides Piccolo,Waived  ?Date Value Ref Range Status  ?12/11/2017 167 (H) <150 mg/dL Final  ?  Comment:  ?                          Normal                   <150 ?                        Borderline High     150 - 199 ?                        High                200 - 499 ?                        Very High                >499 ?  ? ?  ?  ?  Passed - Patient is not pregnant  ?  ?  Passed - Valid encounter within last 12 months  ?  Recent Outpatient Visits   ?      ? 2 months ago Diarrhea, unspecified type  ? [place], [place] T, NP  ? 4 months ago Centrilobular emphysema ([facility])  ? [place], [place] T, NP  ? 6 months ago Viral upper respiratory tract infection  ? [place], NP  ? 7 months ago Thoracic aortic aneurysm without rupture [facility])  ? [place], NP  ? 9 months ago Aortic atherosclerosis ([place])  ? [facility] Jon Billings, NP  ?   ?  ? ?  ?  ?  ? ?

## 2021-10-08 ENCOUNTER — Ambulatory Visit: Payer: Medicare HMO | Admitting: Nurse Practitioner

## 2021-10-17 NOTE — Patient Instructions (Incomplete)
Chronic Obstructive Pulmonary Disease  Chronic obstructive pulmonary disease (COPD) is a long-term (chronic) lung problem. When you have COPD, it is hard for air to get in and out of your lungs. Usually the condition gets worse over time, and your lungs will never return to normal. There are things you can do to keep yourself as healthy as possible. What are the causes? Smoking. This is the most common cause. Certain genes passed from parent to child (inherited). What increases the risk? Being exposed to secondhand smoke from cigarettes, pipes, or cigars. Being exposed to chemicals and other irritants, such as fumes and dust in the work environment. Having chronic lung conditions or infections. What are the signs or symptoms? Shortness of breath, especially during physical activity. A long-term cough with a large amount of thick mucus. Sometimes, the cough may not have any mucus (dry cough). Wheezing. Breathing quickly. Skin that looks gray or blue, especially in the fingers, toes, or lips. Feeling tired (fatigue). Weight loss. Chest tightness. Having infections often. Episodes when breathing symptoms become much worse (exacerbations). At the later stages of this disease, you may have swelling in the ankles, feet, or legs. How is this treated? Taking medicines. Quitting smoking, if you smoke. Rehabilitation. This includes steps to make your body work better. It may involve a team of specialists. Doing exercises. Making changes to your diet. Using oxygen. Lung surgery. Lung transplant. Comfort measures (palliative care). Follow these instructions at home: Medicines Take over-the-counter and prescription medicines only as told by your doctor. Talk to your doctor before taking any cough or allergy medicines. You may need to avoid medicines that cause your lungs to be dry. Lifestyle If you smoke, stop smoking. Smoking makes the problem worse. Do not smoke or use any products that  contain nicotine or tobacco. If you need help quitting, ask your doctor. Avoid being around things that make your breathing worse. This may include smoke, chemicals, and fumes. Stay active, but remember to rest as well. Learn and use tips on how to manage stress and control your breathing. Make sure you get enough sleep. Most adults need at least 7 hours of sleep every night. Eat healthy foods. Eat smaller meals more often. Rest before meals. Controlled breathing Learn and use tips on how to control your breathing as told by your doctor. Try: Breathing in (inhaling) through your nose for 1 second. Then, pucker your lips and breath out (exhale) through your lips for 2 seconds. Putting one hand on your belly (abdomen). Breathe in slowly through your nose for 1 second. Your hand on your belly should move out. Pucker your lips and breathe out slowly through your lips. Your hand on your belly should move in as you breathe out.  Controlled coughing Learn and use controlled coughing to clear mucus from your lungs. Follow these steps: Lean your head a little forward. Breathe in deeply. Try to hold your breath for 3 seconds. Keep your mouth slightly open while coughing 2 times. Spit any mucus out into a tissue. Rest and do the steps again 1 or 2 times as needed. General instructions Make sure you get all the shots (vaccines) that your doctor recommends. Ask your doctor about a flu shot and a pneumonia shot. Use oxygen therapy and pulmonary rehabilitation if told by your doctor. If you need home oxygen therapy, ask your doctor if you should buy a tool to measure your oxygen level (oximeter). Make a COPD action plan with your doctor. This helps you   to know what to do if you feel worse than usual. Manage any other conditions you have as told by your doctor. Avoid going outside when it is very hot, cold, or humid. Avoid people who have a sickness you can catch (contagious). Keep all follow-up  visits. Contact a doctor if: You cough up more mucus than usual. There is a change in the color or thickness of the mucus. It is harder to breathe than usual. Your breathing is faster than usual. You have trouble sleeping. You need to use your medicines more often than usual. You have trouble doing your normal activities such as getting dressed or walking around the house. Get help right away if: You have shortness of breath while resting. You have shortness of breath that stops you from: Being able to talk. Doing normal activities. Your chest hurts for longer than 5 minutes. Your skin color is more blue than usual. Your pulse oximeter shows that you have low oxygen for longer than 5 minutes. You have a fever. You feel too tired to breathe normally. These symptoms may represent a serious problem that is an emergency. Do not wait to see if the symptoms will go away. Get medical help right away. Call your local emergency services (911 in the [place] Do not drive yourself to the hospital. Summary Chronic obstructive pulmonary disease (COPD) is a long-term lung problem. The way your lungs work will never return to normal. Usually the condition gets worse over time. There are things you can do to keep yourself as healthy as possible. Take over-the-counter and prescription medicines only as told by your doctor. If you smoke, stop. Smoking makes the problem worse. This information is not intended to replace advice given to you by your health care provider. Make sure you discuss any questions you have with your health care provider. Document Revised: 03/24/2020 Document Reviewed: 03/24/2020 Elsevier Patient Education  2023 Elsevier Inc.  

## 2021-10-18 ENCOUNTER — Ambulatory Visit: Payer: Medicare HMO | Admitting: Nurse Practitioner

## 2021-10-19 ENCOUNTER — Ambulatory Visit: Payer: Medicare HMO | Admitting: Nurse Practitioner

## 2021-10-19 ENCOUNTER — Other Ambulatory Visit: Payer: Self-pay | Admitting: Nurse Practitioner

## 2021-10-20 ENCOUNTER — Telehealth: Payer: Self-pay

## 2021-10-20 ENCOUNTER — Encounter: Payer: Self-pay | Admitting: Nurse Practitioner

## 2021-10-20 ENCOUNTER — Ambulatory Visit (INDEPENDENT_AMBULATORY_CARE_PROVIDER_SITE_OTHER): Payer: Medicare HMO | Admitting: Nurse Practitioner

## 2021-10-20 VITALS — BP 105/71 | HR 84 | Temp 98.0°F | Ht 66.0 in | Wt 126.6 lb

## 2021-10-20 DIAGNOSIS — I700 Atherosclerosis of aorta: Secondary | ICD-10-CM | POA: Diagnosis not present

## 2021-10-20 DIAGNOSIS — F339 Major depressive disorder, recurrent, unspecified: Secondary | ICD-10-CM

## 2021-10-20 DIAGNOSIS — E782 Mixed hyperlipidemia: Secondary | ICD-10-CM | POA: Diagnosis not present

## 2021-10-20 DIAGNOSIS — R7989 Other specified abnormal findings of blood chemistry: Secondary | ICD-10-CM

## 2021-10-20 DIAGNOSIS — J432 Centrilobular emphysema: Secondary | ICD-10-CM

## 2021-10-20 DIAGNOSIS — I10 Essential (primary) hypertension: Secondary | ICD-10-CM

## 2021-10-20 DIAGNOSIS — F419 Anxiety disorder, unspecified: Secondary | ICD-10-CM | POA: Diagnosis not present

## 2021-10-20 DIAGNOSIS — M961 Postlaminectomy syndrome, not elsewhere classified: Secondary | ICD-10-CM

## 2021-10-20 MED ORDER — MEGESTROL ACETATE 40 MG PO TABS: 40.0000 mg | ORAL_TABLET | Freq: Four times a day (QID) | ORAL | 4 refills | Status: DC | PRN

## 2021-10-20 MED ORDER — SERTRALINE HCL 100 MG PO TABS: 150.0000 mg | ORAL_TABLET | Freq: Every day | ORAL | 4 refills | Status: DC

## 2021-10-20 MED ORDER — GABAPENTIN 300 MG PO CAPS: 300.0000 mg | ORAL_CAPSULE | Freq: Three times a day (TID) | ORAL | 4 refills | Status: DC

## 2021-10-20 MED ORDER — ROSUVASTATIN CALCIUM 40 MG PO TABS: 40.0000 mg | ORAL_TABLET | Freq: Every day | ORAL | 4 refills | Status: DC

## 2021-10-20 MED ORDER — CYCLOBENZAPRINE HCL 5 MG PO TABS: ORAL_TABLET | ORAL | 12 refills | Status: DC

## 2021-10-20 MED ORDER — BUPROPION HCL ER (XL) 150 MG PO TB24
150.0000 mg | ORAL_TABLET | Freq: Every day | ORAL | 4 refills | Status: DC
Start: 2021-10-20 — End: 2022-01-10

## 2021-10-20 MED ORDER — MONTELUKAST SODIUM 10 MG PO TABS: 10.0000 mg | ORAL_TABLET | Freq: Every day | ORAL | 4 refills | Status: DC

## 2021-10-20 MED ORDER — AMLODIPINE BESYLATE 5 MG PO TABS: 5.0000 mg | ORAL_TABLET | Freq: Every day | ORAL | 4 refills | Status: DC

## 2021-10-20 MED ORDER — PANTOPRAZOLE SODIUM 40 MG PO TBEC: 40.0000 mg | DELAYED_RELEASE_TABLET | Freq: Every day | ORAL | 4 refills | Status: DC

## 2021-10-20 MED ORDER — CARVEDILOL 12.5 MG PO TABS: 12.5000 mg | ORAL_TABLET | Freq: Two times a day (BID) | ORAL | 4 refills | Status: DC

## 2021-10-20 MED ORDER — BENAZEPRIL HCL 40 MG PO TABS: 40.0000 mg | ORAL_TABLET | Freq: Every day | ORAL | 4 refills | Status: DC

## 2021-10-20 MED ORDER — TIZANIDINE HCL 4 MG PO TABS: 4.0000 mg | ORAL_TABLET | Freq: Four times a day (QID) | ORAL | 2 refills | Status: DC | PRN

## 2021-10-20 NOTE — Assessment & Plan Note (Signed)
Chronic, stable.  BP at goal today and often at goal at home on checks per patient.  Recommend she monitor BP at least a few mornings a week at home and document.  DASH diet at home.  Continue current medication regimen and adjust as needed.  Labs today: up to date.  Refills sent.  Continue collaboration with cardiology.

## 2021-10-20 NOTE — Assessment & Plan Note (Signed)
Noted on CT imaging in past, at this time recommend complete cessation of smoking and will continue Atorvastatin for prevention.  Lipid panel next visit, fasting.

## 2021-10-20 NOTE — Patient Instructions (Addendum)
- Start taking Magnesium 400 MG at night before bed for leg cramps and sleep. - Stop Atorvastatin and start Rosuvastatin. - Start Melatonin 10 MG every night for sleep. - Continue Sertraline for mood and start Wellbutrin XR 150 MG for depression with this, also may help you cut back on smoking. - Stop Flexeril and start Tizanidine for muscle spasms -- safer for your age group  Managing Depression, Adult Depression is a mental health condition that affects your thoughts, feelings, and actions. Being diagnosed with depression can bring you relief if you did not know why you have felt or behaved a certain way. It could also leave you feeling overwhelmed with uncertainty about your future. Preparing yourself to manage your symptoms can help you feel more positive about your future. How to manage lifestyle changes Managing stress  Stress is your body's reaction to life changes and events, both good and bad. Stress can add to your feelings of depression. Learning to manage your stress can help lessen your feelings of depression. Try some of the following approaches to reducing your stress (stress reduction techniques): Listen to music that you enjoy and that inspires you. Try using a meditation app or take a meditation class. Develop a practice that helps you connect with your spiritual self. Walk in nature, pray, or go to a place of worship. Do some deep breathing. To do this, inhale slowly through your nose. Pause at the top of your inhale for a few seconds and then exhale slowly, letting your muscles relax. Practice yoga to help relax and work your muscles. Choose a stress reduction technique that suits your lifestyle and personality. These techniques take time and practice to develop. Set aside 5-15 minutes a day to do them. Therapists can offer training in these techniques. Other things you can do to manage stress include: Keeping a stress diary. Knowing your limits and saying no when you think  something is too much. Paying attention to how you react to certain situations. You may not be able to control everything, but you can change your reaction. Adding humor to your life by watching funny films or TV shows. Making time for activities that you enjoy and that relax you.  Medicines Medicines, such as antidepressants, are often a part of treatment for depression. Talk with your pharmacist or health care provider about all the medicines, supplements, and herbal products that you take, their possible side effects, and what medicines and other products are safe to take together. Make sure to report any side effects you may have to your health care provider. Relationships Your health care provider may suggest family therapy, couples therapy, or individual therapy as part of your treatment. How to recognize changes Everyone responds differently to treatment for depression. As you recover from depression, you may start to: Have more interest in doing activities. Feel less hopeless. Have more energy. Overeat less often, or have a better appetite. Have better mental focus. It is important to recognize if your depression is not getting better or is getting worse. The symptoms you had in the beginning may return, such as: Tiredness (fatigue) or low energy. Eating too much or too little. Sleeping too much or too little. Feeling restless, agitated, or hopeless. Trouble focusing or making decisions. Unexplained physical complaints. Feeling irritable, angry, or aggressive. If you or your family members notice these symptoms coming back, let your health care provider know right away. Follow these instructions at home: Activity  Try to get some form of exercise  each day, such as walking, biking, swimming, or lifting weights. Practice stress reduction techniques. Engage your mind by taking a class or doing some volunteer work. Lifestyle Get the right amount and quality of sleep. Cut down  on using caffeine, tobacco, alcohol, and other potentially harmful substances. Eat a healthy diet that includes plenty of vegetables, fruits, whole grains, low-fat dairy products, and lean protein. Do not eat a lot of foods that are high in solid fats, added sugars, or salt (sodium). General instructions Take over-the-counter and prescription medicines only as told by your health care provider. Keep all follow-up visits as told by your health care provider. This is important. Where to find support Talking to others  Friends and family members can be sources of support and guidance. Talk to trusted friends or family members about your condition. Explain your symptoms to them, and let them know that you are working with a health care provider to treat your depression. Tell friends and family members how they also can be helpful. Finances Find appropriate mental health providers that fit with your financial situation. Talk with your health care provider about options to get reduced prices on your medicines. Where to find more information You can find support in your area from: Anxiety and Depression Association of America (ADAA): www.adaa.org Mental Health America: www.mentalhealthamerica.net [employer] on Mental Illness: www.nami.org Contact a health care provider if: You stop taking your antidepressant medicines, and you have any of these symptoms: Nausea. Headache. Light-headedness. Chills and body aches. Not being able to sleep (insomnia). You or your friends and family think your depression is getting worse. Get help right away if: You have thoughts of hurting yourself or others. If you ever feel like you may hurt yourself or others, or have thoughts about taking your own life, get help right away. Go to your nearest emergency department or: Call your local emergency services (911 in the [place] Call a suicide crisis helpline, such as the [place] [facility] at  757-757-1526 or 988 in the [place]. This is open 24 hours a day in the [place] Text the Crisis Text Line at 414-486-8320 (in the [place].). Summary If you are diagnosed with depression, preparing yourself to manage your symptoms is a good way to feel positive about your future. Work with your health care provider on a management plan that includes stress reduction techniques, medicines (if applicable), therapy, and healthy lifestyle habits. Keep talking with your health care provider about how your treatment is working. If you have thoughts about taking your own life, call a suicide crisis helpline or text a crisis text line. This information is not intended to replace advice given to you by your health care provider. Make sure you discuss any questions you have with your health care provider. Document Revised: 12/09/2020 Document Reviewed: 03/27/2019 Elsevier Patient Education  [place].

## 2021-10-20 NOTE — Telephone Encounter (Signed)
Requested medication (s) are due for refill today: Due 11/05/21  Requested medication (s) are on the active medication list: yes    Last refill: 05/07/21  #180  1 refill  Future visit scheduled yes   Notes to clinic:pt has appt today, please review. Thank you  Requested Prescriptions  Pending Prescriptions Disp Refills   carvedilol (COREG) 12.5 MG tablet [Pharmacy Med Name: CARVEDILOL 12.'5MG'$  TABLETS] 180 tablet 1    Sig: TAKE ONE TABLET BY MOUTH TWICE DAILY     Cardiovascular: Beta Blockers 3 Passed - 10/19/2021  8:52 AM      Passed - Cr in normal range and within 360 days    Creatinine, Ser  Date Value Ref Range Status  06/30/2021 0.91 0.57 - 1.00 mg/dL Final         Passed - AST in normal range and within 360 days    AST  Date Value Ref Range Status  06/30/2021 16 0 - 40 IU/L Final   AST (SGOT) Piccolo, Waived  Date Value Ref Range Status  12/11/2017 27 11 - 38 U/L Final         Passed - ALT in normal range and within 360 days    ALT  Date Value Ref Range Status  06/30/2021 15 0 - 32 IU/L Final   ALT (SGPT) Piccolo, Waived  Date Value Ref Range Status  12/11/2017 32 10 - 47 U/L Final         Passed - Last BP in normal range    BP Readings from Last 1 Encounters:  06/28/21 129/80         Passed - Last Heart Rate in normal range    Pulse Readings from Last 1 Encounters:  06/28/21 76         Passed - Valid encounter within last 6 months    Recent Outpatient Visits           3 months ago Diarrhea, unspecified type   [facility], [place] T, NP   5 months ago Centrilobular emphysema ([place])   [place], [place] T, NP   7 months ago Viral upper respiratory tract infection   [facility] Jon Billings, NP   8 months ago Thoracic aortic aneurysm without rupture [facility])   [facility] Jon Billings, NP   10 months ago Aortic atherosclerosis ([place])   [facility] Jon Billings, NP        Future Appointments             Today Venita Lick, NP [place]

## 2021-10-20 NOTE — Assessment & Plan Note (Signed)
Chronic, exacerbated by illness of dog and no support locally. Denies SI/HI.  Will place CCM referral.  Discussed with patient adding on Wellbutrin with her Sertraline, educated her on this.  Will start Wellbutrin XR 150 MG daily, which may benefit depression and smoking reduction -- she is very interested in trying this.  Continue Sertraline as ordered.  Plan for return in 6 weeks for follow-up.

## 2021-10-20 NOTE — Assessment & Plan Note (Signed)
Chronic, ongoing.  Continue Trelegy and Albuterol, Trelegy offering benefit, but is costly.  Will place CCM referral and provide two samples today to make it to end of month.  She has not attended pulmonary, a referral was placed in December 2022 -- will continue to recommend this.  Continue annual lung screening, has missed some years due to cost at this time.  Recommend complete cessation of smoking.

## 2021-10-20 NOTE — Addendum Note (Signed)
Addended by: Marnee Guarneri T on: 10/20/2021 12:43 PM   Modules accepted: Orders

## 2021-10-20 NOTE — Chronic Care Management (AMB) (Signed)
  Chronic Care Management   Note  10/20/2021 Name: Caitlyn May MRN: [id number] DOB: [birth year]  Caitlyn May is a 66 y.o. year old female who is a primary care patient of Cannady, Barbaraann Faster, NP. Caitlyn May is currently enrolled in care management services. An additional referral for Pharm d and RNCM  was placed.   Follow up plan: Unsuccessful telephone outreach attempt made. A HIPAA compliant phone message was left for the patient providing contact information and requesting a return call.  The care management team will reach out to the patient again over the next 7 days.  If patient returns call to provider office, please advise to call [facility]  at [place] [place] [employer]  [place], [place] [address] Direct Dial: [phone] [email or web address]: [email or web address]

## 2021-10-20 NOTE — Progress Notes (Signed)
BP 105/71   Pulse 84   Temp 98 F (36.7 C) (Oral)   Ht 5' 6" (1.676 m)   Wt 126 lb 9.6 oz (57.4 kg)   SpO2 99%   BMI 20.43 kg/m    Subjective:    Patient ID: Caitlyn May, female    DOB: [birth year], 66 y.o.   MRN: [id number]  HPI: Caitlyn May is a 66 y.o. female  Chief Complaint  Patient presents with   Medication Management    Patient is here for Medication Management. Patient is here requesting refills on her medications. Patient says she is completely out of some of her medications. Patient does have her current medications for the provider to review at today's visit.    Presents for multiple refills needed on medications.  HYPERTENSION / HYPERLIPIDEMIA Taking Benazepril, Carvedilol, Amlodipine, and Atorvastatin daily.  Is having leg cramps with Atorvastatin -- ongoing for months.   Satisfied with current treatment? yes Duration of hypertension: chronic BP monitoring frequency: a few times a day BP range: 110-190/70-90 BP medication side effects: no Duration of hyperlipidemia: chronic Cholesterol medication side effects: no Cholesterol supplements: none Medication compliance: good compliance Aspirin: no Recent stressors: no Recurrent headaches: no Visual changes: no Palpitations: no Dyspnea: yes at baseline Chest pain: no Lower extremity edema: no Dizzy/lightheaded: no   COPD Taking Trelegy and Albuterol daily.  Had last CT on 08/15/2019 which noted aortic atherosclerosis and emphysema, was to have repeat imaging last month but could not afford OOP cost of >$300.  Started smoking at age 79-14 years.  Currently is cutting back, was a 2 PPD smoker and now is at 1 PPD.   COPD status: stable Satisfied with current treatment?: yes Oxygen use: no Dyspnea frequency: at baseline Cough frequency: at baseline Rescue inhaler frequency:  2-3 times a day Limitation of activity: no Productive cough: none Last Spirometry: unknown Pneumovax: Up to Date Influenza:  Up to Date   DEPRESSION Taking Zoloft daily for mood.  She is going through a rough time right now as has no family locally, has two dogs -- one is doing poorly. Mood status: stable Satisfied with current treatment?: yes Symptom severity: moderate  Duration of current treatment : chronic Side effects: no Medication compliance: good compliance Psychotherapy/counseling: yes in the past Previous psychiatric medications: Xanax Depressed mood: yes Anxious mood: yes Anhedonia: no Significant weight loss or gain: no Insomnia: yes hard to stay asleep Fatigue: yes Feelings of worthlessness or guilt: yes Impaired concentration/indecisiveness: no Suicidal ideations: no Hopelessness: at times Crying spells: yes    10/20/2021   11:18 AM 06/22/2021    9:14 AM 05/07/2021   10:00 AM 02/03/2021   10:41 AM 10/23/2020    4:16 PM  Depression screen PHQ 2/9  Decreased Interest _0 0 0  Down, Depressed, Hopeless _1 0  PHQ - 2 Score _2 0  Altered sleeping _3 Tired, decreased energy _4 Change in appetite _5 0 1  Feeling bad or failure about yourself  2 0 1 0 0  Trouble concentrating 1 0 0 0 0  Moving slowly or fidgety/restless 1 0 0 0 0  Suicidal thoughts 0 0 0 0 0  PHQ-9 Score _6 Difficult doing work/chores  Somewhat difficult  Not difficult at all Not difficult at all       10/20/2021   11:18 AM 05/07/2021  10:01 AM 05/19/2020    1:06 PM 04/20/2020    1:58 PM  GAD 7 : Generalized Anxiety Score  Nervous, Anxious, on Edge _0 Control/stop worrying _1 Worry too much - different things _2 Trouble relaxing _3 Restless _4 Easily annoyed or irritable _5 Afraid - awful might happen 1 0 0 0  Total GAD 7 Score _6 Anxiety Difficulty Very difficult Not difficult at all Not difficult at all Somewhat difficult     Relevant past medical, surgical, family and social history reviewed and updated as indicated. Interim  medical history since our last visit reviewed. Allergies and medications reviewed and updated.  Review of Systems  Constitutional:  Negative for activity change, appetite change, diaphoresis, fatigue and fever.  HENT:  Negative for ear discharge and ear pain.   Respiratory:  Negative for cough, chest tightness and shortness of breath.   Cardiovascular:  Negative for chest pain, palpitations and leg swelling.  Gastrointestinal: Negative.   Endocrine: Negative.   Musculoskeletal:  Positive for arthralgias.  Neurological: Negative.   Psychiatric/Behavioral:  Positive for sleep disturbance. Negative for decreased concentration, self-injury and suicidal ideas. The patient is not nervous/anxious.    Per HPI unless specifically indicated above     Objective:    BP 105/71   Pulse 84   Temp 98 F (36.7 C) (Oral)   Ht 5' 6" (1.676 m)   Wt 126 lb 9.6 oz (57.4 kg)   SpO2 99%   BMI 20.43 kg/m   Wt Readings from Last 3 Encounters:  10/20/21 126 lb 9.6 oz (57.4 kg)  05/07/21 113 lb 6.4 oz (51.4 kg)  04/12/21 117 lb (53.1 kg)    Physical Exam Vitals and nursing note reviewed.  Constitutional:      General: She is awake. She is not in acute distress.    Appearance: She is well-developed and well-groomed. She is not ill-appearing or toxic-appearing.  HENT:     Head: Normocephalic.     Right Ear: Hearing and external ear normal.     Left Ear: Hearing and external ear normal.     Nose: Nose normal.     Mouth/Throat:     Mouth: Mucous membranes are moist.  Eyes:     General: Lids are normal.        Right eye: No discharge.        Left eye: No discharge.     Conjunctiva/sclera: Conjunctivae normal.     Pupils: Pupils are equal, round, and reactive to light.  Neck:     Thyroid: No thyromegaly.     Vascular: No carotid bruit.  Cardiovascular:     Rate and Rhythm: Normal rate and regular rhythm.     Heart sounds: Normal heart sounds. No murmur heard.   No gallop.  Pulmonary:      Effort: Pulmonary effort is normal. No accessory muscle usage or respiratory distress.     Breath sounds: Normal breath sounds.  Abdominal:     General: Bowel sounds are normal.     Palpations: Abdomen is soft.  Musculoskeletal:     Cervical back: Normal range of motion and neck supple.     Right lower leg: No edema.     Left lower leg: No edema.  Lymphadenopathy:     Cervical: No cervical adenopathy.  Skin:    General:  Skin is warm and dry.  Neurological:     Mental Status: She is alert and oriented to person, place, and time.  Psychiatric:        Attention and Perception: Attention normal.        Mood and Affect: Mood normal.        Speech: Speech normal.        Behavior: Behavior normal. Behavior is cooperative.        Thought Content: Thought content normal.   Results for orders placed or performed in visit on 06/30/21  CBC with Differential/Platelet  Result Value Ref Range   WBC 9.4 3.4 - 10.8 x10E3/uL   RBC 4.43 3.77 - 5.28 x10E6/uL   Hemoglobin 14.3 11.1 - 15.9 g/dL   Hematocrit 42.2 34.0 - 46.6 %   MCV 95 79 - 97 fL   MCH 32.3 26.6 - 33.0 pg   MCHC 33.9 31.5 - 35.7 g/dL   RDW 12.1 11.7 - 15.4 %   Platelets 233 150 - 450 x10E3/uL   Neutrophils 67 Not Estab. %   Lymphs 23 Not Estab. %   Monocytes 8 Not Estab. %   Eos 1 Not Estab. %   Basos 1 Not Estab. %   Neutrophils Absolute 6.3 1.4 - 7.0 x10E3/uL   Lymphocytes Absolute 2.2 0.7 - 3.1 x10E3/uL   Monocytes Absolute 0.8 0.1 - 0.9 x10E3/uL   EOS (ABSOLUTE) 0.1 0.0 - 0.4 x10E3/uL   Basophils Absolute 0.1 0.0 - 0.2 x10E3/uL   Immature Granulocytes 0 Not Estab. %   Immature Grans (Abs) 0.0 0.0 - 0.1 x10E3/uL  Comprehensive metabolic panel  Result Value Ref Range   Glucose 84 70 - 99 mg/dL   BUN 15 8 - 27 mg/dL   Creatinine, Ser 0.91 0.57 - 1.00 mg/dL   eGFR 70 >59 mL/min/1.73   BUN/Creatinine Ratio 16 12 - 28   Sodium 143 134 - 144 mmol/L   Potassium 3.4 (L) 3.5 - 5.2 mmol/L   Chloride 105 96 - 106 mmol/L    CO2 22 20 - 29 mmol/L   Calcium 9.1 8.7 - 10.3 mg/dL   Total Protein 6.4 6.0 - 8.5 g/dL   Albumin 4.2 3.8 - 4.8 g/dL   Globulin, Total 2.2 1.5 - 4.5 g/dL   Albumin/Globulin Ratio 1.9 1.2 - 2.2   Bilirubin Total <0.2 0.0 - 1.2 mg/dL   Alkaline Phosphatase 76 44 - 121 IU/L   AST 16 0 - 40 IU/L   ALT 15 0 - 32 IU/L  T4, free  Result Value Ref Range   Free T4 1.17 0.82 - 1.77 ng/dL  TSH  Result Value Ref Range   TSH 2.680 0.450 - 4.500 uIU/mL      Assessment & Plan:   Problem List Items Addressed This Visit       Cardiovascular and Mediastinum   Aortic atherosclerosis ([place])    Noted on CT imaging in past, at this time recommend complete cessation of smoking and will continue Atorvastatin for prevention.  Lipid panel next visit, fasting.       Relevant Medications   rosuvastatin (CRESTOR) 40 MG tablet   benazepril (LOTENSIN) 40 MG tablet   carvedilol (COREG) 12.5 MG tablet   amLODipine (NORVASC) 5 MG tablet   Hypertension    Chronic, stable.  BP at goal today and often at goal at home on checks per patient.  Recommend she monitor BP at least a few mornings a week at home and document.  DASH diet at home.  Continue current medication regimen and adjust as needed.  Labs today: up to date.  Refills sent.  Continue collaboration with cardiology.          Relevant Medications   rosuvastatin (CRESTOR) 40 MG tablet   benazepril (LOTENSIN) 40 MG tablet   carvedilol (COREG) 12.5 MG tablet   amLODipine (NORVASC) 5 MG tablet     Respiratory   Centrilobular emphysema (HCC) - Primary    Chronic, ongoing.  Continue Trelegy and Albuterol, Trelegy offering benefit, but is costly.  Will place CCM referral and provide two samples today to make it to end of month.  She has not attended pulmonary, a referral was placed in December 2022 -- will continue to recommend this.  Continue annual lung screening, has missed some years due to cost at this time.  Recommend complete cessation of  smoking.       Relevant Medications   montelukast (SINGULAIR) 10 MG tablet     Other   Postlaminectomy syndrome, lumbar region (Chronic)    Chronic, ongoing issue.  Previously was seeing in pain management -- was on long term opioid and benzos at one point.  Will have return to pain management as needed, for now continue muscle relaxer and Gabapentin.  Will change to Tizanidine and stop Flexeril due to age and BEERS criteria, discussed with patient.       Anxiety    Refer to depression plan of care for further.       Relevant Medications   sertraline (ZOLOFT) 100 MG tablet   buPROPion (WELLBUTRIN XL) 150 MG 24 hr tablet   Depression, recurrent (HCC)    Chronic, exacerbated by illness of dog and no support locally. Denies SI/HI.  Will place CCM referral.  Discussed with patient adding on Wellbutrin with her Sertraline, educated her on this.  Will start Wellbutrin XR 150 MG daily, which may benefit depression and smoking reduction -- she is very interested in trying this.  Continue Sertraline as ordered.  Plan for return in 6 weeks for follow-up.         Relevant Medications   sertraline (ZOLOFT) 100 MG tablet   buPROPion (WELLBUTRIN XL) 150 MG 24 hr tablet   Elevated TSH    Recent levels within normal ranges -- plan on recheck next visit.       Hyperlipidemia    Chronic, ongoing.  At this time will stop Atorvastatin due to leg cramps with this at 80 MG and start Rosuvastatin 40 MG daily, which may improve leg cramps.  Recommend taking Magnesium at night for leg cramps and sleep, wrote this down for her.  Labs next visit.        Relevant Medications   rosuvastatin (CRESTOR) 40 MG tablet   benazepril (LOTENSIN) 40 MG tablet   carvedilol (COREG) 12.5 MG tablet   amLODipine (NORVASC) 5 MG tablet     Follow up plan: Return in about 6 weeks (around 12/01/2021) for Depression -- with Wellbutrin + labs -- check cholesterol.

## 2021-10-20 NOTE — Assessment & Plan Note (Signed)
Chronic, ongoing.  At this time will stop Atorvastatin due to leg cramps with this at 80 MG and start Rosuvastatin 40 MG daily, which may improve leg cramps.  Recommend taking Magnesium at night for leg cramps and sleep, wrote this down for her.  Labs next visit.

## 2021-10-20 NOTE — Assessment & Plan Note (Signed)
Recent levels within normal ranges -- plan on recheck next visit.

## 2021-10-20 NOTE — Assessment & Plan Note (Signed)
Refer to depression plan of care for further. 

## 2021-10-20 NOTE — Assessment & Plan Note (Signed)
Chronic, ongoing issue.  Previously was seeing in pain management -- was on long term opioid and benzos at one point.  Will have return to pain management as needed, for now continue muscle relaxer and Gabapentin.  Will change to Tizanidine and stop Flexeril due to age and BEERS criteria, discussed with patient.

## 2021-10-22 NOTE — Chronic Care Management (AMB) (Signed)
  Chronic Care Management   Note  10/22/2021 Name: Caitlyn May MRN: [id number] DOB: 23-Nov-1955  Caitlyn May is a 66 y.o. year old female who is a primary care patient of Cannady, Barbaraann Faster, NP. Caitlyn May is currently enrolled in care management services. An additional referral for RNCM and Pharm D  was placed.   Follow up plan: Unsuccessful telephone outreach attempt made. A HIPAA compliant phone message was left for the patient providing contact information and requesting a return call.  The care management team will reach out to the patient again over the next 7 days.  If patient returns call to provider office, please advise to call [facility]  at [place], [place], [place], [place] [address] Direct Dial: [phone] [email or web address] Website: [email or web address]

## 2021-10-22 NOTE — Chronic Care Management (AMB) (Signed)
  Chronic Care Management   Note  10/22/2021 Name: Caitlyn May MRN: [id number] DOB: 1955/08/31  Caitlyn May is a 66 y.o. year old female who is a primary care patient of Cannady, Barbaraann Faster, NP. I reached out to [facility] by phone today in response to a referral sent by [name] PCP.  [name] was given information about Chronic Care Management services today including:  CCM service includes personalized support from designated clinical staff supervised by her physician, including individualized plan of care and coordination with other care providers 24/7 contact phone numbers for assistance for urgent and routine care needs. Service will only be billed when office clinical staff spend 20 minutes or more in a month to coordinate care. Only one practitioner may furnish and bill the service in a calendar month. The patient may stop CCM services at any time (effective at the end of the month) by phone call to the office staff. The patient is responsible for co-pay (up to 20% after annual deductible is met) if co-pay is required by the individual health plan.   Patient agreed to services and verbal consent obtained.   Follow up plan: Telephone appointment with care management team member scheduled for:11/03/2021  Noreene Larsson, [place], [place], [place] [address] Direct Dial: [phone] [email or web address] Website: [email or web address]

## 2021-10-28 ENCOUNTER — Other Ambulatory Visit: Payer: Self-pay | Admitting: Nurse Practitioner

## 2021-10-28 NOTE — Telephone Encounter (Signed)
Medication was refilled 10/20/21 by PCP, will refuse this request.  Requested Prescriptions  Pending Prescriptions Disp Refills  . carvedilol (COREG) 12.5 MG tablet [Pharmacy Med Name: CARVEDILOL 12.'5MG'$  TABLETS] 180 tablet 4    Sig: TAKE ONE TABLET BY MOUTH TWICE DAILY     Cardiovascular: Beta Blockers 3 Passed - 10/28/2021  8:03 AM      Passed - Cr in normal range and within 360 days    Creatinine, Ser  Date Value Ref Range Status  06/30/2021 0.91 0.57 - 1.00 mg/dL Final         Passed - AST in normal range and within 360 days    AST  Date Value Ref Range Status  06/30/2021 16 0 - 40 IU/L Final   AST (SGOT) Piccolo, Waived  Date Value Ref Range Status  12/11/2017 27 11 - 38 U/L Final         Passed - ALT in normal range and within 360 days    ALT  Date Value Ref Range Status  06/30/2021 15 0 - 32 IU/L Final   ALT (SGPT) Piccolo, Waived  Date Value Ref Range Status  12/11/2017 32 10 - 47 U/L Final         Passed - Last BP in normal range    BP Readings from Last 1 Encounters:  10/20/21 105/71         Passed - Last Heart Rate in normal range    Pulse Readings from Last 1 Encounters:  10/20/21 84         Passed - Valid encounter within last 6 months    Recent Outpatient Visits          1 week ago Centrilobular emphysema ([place])   [facility], [place] T, NP   4 months ago Diarrhea, unspecified type   [facility], [place] T, NP   5 months ago Centrilobular emphysema ([place])   [place], [place] T, NP   7 months ago Viral upper respiratory tract infection   [facility] Jon Billings, NP   8 months ago Thoracic aortic aneurysm without rupture [facility])   [facility] Jon Billings, NP      Future Appointments            In 1 month Cannady, Barbaraann Faster, NP [employer], [facility]

## 2021-10-29 ENCOUNTER — Telehealth: Payer: Self-pay

## 2021-10-29 NOTE — Chronic Care Management (AMB) (Signed)
  Chronic Care Management   Note  10/29/2021 Name: Felina Tello MRN: [id number] DOB: Oct 11, 1955  Shakyia Bosso is a 66 y.o. year old female who is a primary care patient of Cannady, Barbaraann Faster, NP. Maresha Anastos is currently enrolled in care management services. An additional referral for Pharm D was placed.   Follow up plan: Unsuccessful telephone outreach attempt made. A HIPAA compliant phone message was left for the patient providing contact information and requesting a return call.  The care management team will reach out to the patient again over the next 5 days.  If patient returns call to provider office, please advise to call [facility]  at [place], [place], [place], [place] [address] Direct Dial: [phone] [email or web address] Website: [email or web address]

## 2021-10-29 NOTE — Chronic Care Management (AMB) (Signed)
  Chronic Care Management   Note  10/29/2021 Name: Eddie Koc MRN: [id number] DOB: January 02, 1956  Shereese Bonnie is a 66 y.o. year old female who is a primary care patient of Cannady, Barbaraann Faster, NP. Kourtni Stineman is currently enrolled in care management services. An additional referral for pharm d  was placed.   Follow up plan: Telephone appointment with care management team member scheduled for:12/20/2021  Noreene Larsson, [place], [place], [place] [address] Direct Dial: [phone] [email or web address] Website: [email or web address]

## 2021-11-03 ENCOUNTER — Telehealth: Payer: Medicare HMO

## 2021-11-03 ENCOUNTER — Encounter: Payer: Self-pay | Admitting: Internal Medicine

## 2021-11-03 ENCOUNTER — Ambulatory Visit (INDEPENDENT_AMBULATORY_CARE_PROVIDER_SITE_OTHER): Payer: Medicare HMO

## 2021-11-03 ENCOUNTER — Telehealth (INDEPENDENT_AMBULATORY_CARE_PROVIDER_SITE_OTHER): Payer: Medicare HMO | Admitting: Internal Medicine

## 2021-11-03 VITALS — BP 147/97 | HR 75

## 2021-11-03 DIAGNOSIS — F339 Major depressive disorder, recurrent, unspecified: Secondary | ICD-10-CM

## 2021-11-03 DIAGNOSIS — G894 Chronic pain syndrome: Secondary | ICD-10-CM

## 2021-11-03 DIAGNOSIS — G8929 Other chronic pain: Secondary | ICD-10-CM

## 2021-11-03 DIAGNOSIS — M160 Bilateral primary osteoarthritis of hip: Secondary | ICD-10-CM

## 2021-11-03 DIAGNOSIS — F419 Anxiety disorder, unspecified: Secondary | ICD-10-CM

## 2021-11-03 DIAGNOSIS — E782 Mixed hyperlipidemia: Secondary | ICD-10-CM

## 2021-11-03 DIAGNOSIS — J432 Centrilobular emphysema: Secondary | ICD-10-CM

## 2021-11-03 DIAGNOSIS — J449 Chronic obstructive pulmonary disease, unspecified: Secondary | ICD-10-CM

## 2021-11-03 DIAGNOSIS — I10 Essential (primary) hypertension: Secondary | ICD-10-CM

## 2021-11-03 DIAGNOSIS — Z9181 History of falling: Secondary | ICD-10-CM

## 2021-11-03 MED ORDER — ALBUTEROL SULFATE HFA 108 (90 BASE) MCG/ACT IN AERS: 2.0000 | INHALATION_SPRAY | Freq: Four times a day (QID) | RESPIRATORY_TRACT | 1 refills | Status: DC | PRN

## 2021-11-03 MED ORDER — HYDROXYZINE PAMOATE 25 MG PO CAPS: 25.0000 mg | ORAL_CAPSULE | Freq: Three times a day (TID) | ORAL | 0 refills | Status: DC | PRN

## 2021-11-03 NOTE — Progress Notes (Signed)
BP (!) 147/97   Pulse 75    Subjective:    Patient ID: Caitlyn May, female    DOB: 1955/07/09, 66 y.o.   MRN: [id number]  Chief Complaint  Patient presents with  . Panic Attack    Had to move limps from her tree out of her driveway to get out of her drive way about 3 days ago, then began gasping for breath and urinated on herself while looking for breathing inhaler. Patient states that since this incident she is scared to death to leave her house. Lives by herself.     HPI: Caitlyn May is a 66 y.o. female   This visit was completed via telephone due to the restrictions of the COVID-19 pandemic. All issues as above were discussed and addressed but no physical exam was performed. If it was felt that the patient should be evaluated in the office, they were directed there. The patient verbally consented to this visit. Patient was unable to complete an audio/visual visit due to Technical difficulties. Due to the catastrophic nature of the COVID-19 pandemic, this visit was done through audio contact only. Location of the patient: home Location of the provider: work Those involved with this call:  Provider: Charlynne Cousins, MD CMA: Frazier Butt, [place] Desk/Registration: [employer]  Time spent on call: 10 minutes on the phone discussing health concerns. 10 minutes total spent in review of patient's record and preparation of their chart.   Had to move tree limbs out of her drive way , by the time she had to move the last one she couldn't breath well. Was trying to get her inhaler for her asthma and before she could get back in she urinated all over herself. Got her inhaler and this scared her to death.     Anxiety Presents for follow-up visit. Symptoms include dizziness, nervous/anxious behavior, panic, restlessness and shortness of breath. Patient reports no chest pain, compulsions, confusion, decreased concentration, depressed mood, feeling of choking, irritability, malaise,  muscle tension, nausea, obsessions or palpitations. Primary symptoms comment: when she got out of her shower she got worried , but got herself calm down and got her inhaler..     Chief Complaint  Patient presents with  . Panic Attack    Had to move limps from her tree out of her driveway to get out of her drive way about 3 days ago, then began gasping for breath and urinated on herself while looking for breathing inhaler. Patient states that since this incident she is scared to death to leave her house. Lives by herself.     Relevant past medical, surgical, family and social history reviewed and updated as indicated. Interim medical history since our last visit reviewed. Allergies and medications reviewed and updated.  Review of Systems  Constitutional:  Negative for irritability.  Respiratory:  Positive for shortness of breath.   Cardiovascular:  Negative for chest pain and palpitations.  Gastrointestinal:  Negative for nausea.  Neurological:  Positive for dizziness.  Psychiatric/Behavioral:  Negative for confusion and decreased concentration. The patient is nervous/anxious.    Per HPI unless specifically indicated above     Objective:    BP (!) 147/97   Pulse 75   Wt Readings from Last 3 Encounters:  10/20/21 126 lb 9.6 oz (57.4 kg)  05/07/21 113 lb 6.4 oz (51.4 kg)  04/12/21 117 lb (53.1 kg)    Physical Exam  Results for orders placed or performed in visit on 06/30/21  CBC with  Differential/Platelet  Result Value Ref Range   WBC 9.4 3.4 - 10.8 x10E3/uL   RBC 4.43 3.77 - 5.28 x10E6/uL   Hemoglobin 14.3 11.1 - 15.9 g/dL   Hematocrit 42.2 34.0 - 46.6 %   MCV 95 79 - 97 fL   MCH 32.3 26.6 - 33.0 pg   MCHC 33.9 31.5 - 35.7 g/dL   RDW 12.1 11.7 - 15.4 %   Platelets 233 150 - 450 x10E3/uL   Neutrophils 67 Not Estab. %   Lymphs 23 Not Estab. %   Monocytes 8 Not Estab. %   Eos 1 Not Estab. %   Basos 1 Not Estab. %   Neutrophils Absolute 6.3 1.4 - 7.0 x10E3/uL    Lymphocytes Absolute 2.2 0.7 - 3.1 x10E3/uL   Monocytes Absolute 0.8 0.1 - 0.9 x10E3/uL   EOS (ABSOLUTE) 0.1 0.0 - 0.4 x10E3/uL   Basophils Absolute 0.1 0.0 - 0.2 x10E3/uL   Immature Granulocytes 0 Not Estab. %   Immature Grans (Abs) 0.0 0.0 - 0.1 x10E3/uL  Comprehensive metabolic panel  Result Value Ref Range   Glucose 84 70 - 99 mg/dL   BUN 15 8 - 27 mg/dL   Creatinine, Ser 0.91 0.57 - 1.00 mg/dL   eGFR 70 >59 mL/min/1.73   BUN/Creatinine Ratio 16 12 - 28   Sodium 143 134 - 144 mmol/L   Potassium 3.4 (L) 3.5 - 5.2 mmol/L   Chloride 105 96 - 106 mmol/L   CO2 22 20 - 29 mmol/L   Calcium 9.1 8.7 - 10.3 mg/dL   Total Protein 6.4 6.0 - 8.5 g/dL   Albumin 4.2 3.8 - 4.8 g/dL   Globulin, Total 2.2 1.5 - 4.5 g/dL   Albumin/Globulin Ratio 1.9 1.2 - 2.2   Bilirubin Total <0.2 0.0 - 1.2 mg/dL   Alkaline Phosphatase 76 44 - 121 IU/L   AST 16 0 - 40 IU/L   ALT 15 0 - 32 IU/L  T4, free  Result Value Ref Range   Free T4 1.17 0.82 - 1.77 ng/dL  TSH  Result Value Ref Range   TSH 2.680 0.450 - 4.500 uIU/mL        Current Outpatient Medications:  .  albuterol (VENTOLIN HFA) 108 (90 Base) MCG/ACT inhaler, Inhale 2 puffs into the lungs every 6 (six) hours as needed for wheezing or shortness of breath., Disp: 18 g, Rfl: 0 .  amLODipine (NORVASC) 5 MG tablet, Take 1 tablet (5 mg total) by mouth daily., Disp: 180 tablet, Rfl: 4 .  benazepril (LOTENSIN) 40 MG tablet, Take 1 tablet (40 mg total) by mouth daily., Disp: 90 tablet, Rfl: 4 .  buPROPion (WELLBUTRIN XL) 150 MG 24 hr tablet, Take 1 tablet (150 mg total) by mouth daily., Disp: 90 tablet, Rfl: 4 .  carvedilol (COREG) 12.5 MG tablet, Take 1 tablet (12.5 mg total) by mouth 2 (two) times daily., Disp: 180 tablet, Rfl: 4 .  Fluticasone-Umeclidin-Vilant (TRELEGY ELLIPTA) 100-62.5-25 MCG/ACT AEPB, Inhale 1 puff into the lungs daily., Disp: 1 each, Rfl: 11 .  gabapentin (NEURONTIN) 300 MG capsule, Take 1 capsule (300 mg total) by mouth 3 (three)  times daily., Disp: 270 capsule, Rfl: 4 .  megestrol (MEGACE) 40 MG tablet, Take 1 tablet (40 mg total) by mouth 4 (four) times daily as needed., Disp: 120 tablet, Rfl: 4 .  montelukast (SINGULAIR) 10 MG tablet, Take 1 tablet (10 mg total) by mouth at bedtime., Disp: 90 tablet, Rfl: 4 .  ondansetron (ZOFRAN ODT) 8 MG  disintegrating tablet, Take 1 tablet (8 mg total) by mouth every 8 (eight) hours as needed for nausea or vomiting., Disp: 30 tablet, Rfl: 0 .  pantoprazole (PROTONIX) 40 MG tablet, Take 1 tablet (40 mg total) by mouth at bedtime., Disp: 90 tablet, Rfl: 4 .  rosuvastatin (CRESTOR) 40 MG tablet, Take 1 tablet (40 mg total) by mouth daily., Disp: 90 tablet, Rfl: 4 .  sertraline (ZOLOFT) 100 MG tablet, Take 1.5 tablets (150 mg total) by mouth daily., Disp: 135 tablet, Rfl: 4 .  tiZANidine (ZANAFLEX) 4 MG tablet, Take 1 tablet (4 mg total) by mouth every 6 (six) hours as needed for muscle spasms., Disp: 90 tablet, Rfl: 2    Assessment & Plan:  Anxiety / panic attacks. Will start pt on hydroxyzine. ? Sec to wellbutrin only new med added last visit with pcp will need a fu appt with pcp next week might need to d/c this med if symptoms worsen.    Problem List Items Addressed This Visit   None    No orders of the defined types were placed in this encounter.    No orders of the defined types were placed in this encounter.    Follow up plan: No follow-ups on file.

## 2021-11-03 NOTE — Patient Instructions (Addendum)
Visit Information   Thank you for taking time to visit with me today. Please don't hesitate to contact me if I can be of assistance to you before our next scheduled telephone appointment.  Following are the goals we discussed today:  (Copy and paste patient goals from clinical care plan here)  Our next appointment is by telephone on 12-15-2021 at 230 pm  Please call the care guide team at [phone] if you need to cancel or reschedule your appointment.   If you are experiencing a Mental Health or [place] or need someone to talk to, please call the Suicide and Crisis Lifeline: 988 call the [place] National Suicide Prevention Lifeline: 925-503-4492 or TTY: 423 748 0913 TTY 978-696-5924) to talk to a trained counselor call 1-800-273-TALK (toll free, 24 hour hotline)   Following is a copy of your full care plan:  Care Plan : RNCM; General Plan of Care (Adult) for Chronic Disease Management and Care Coordination Needs  Updates made by Vanita Ingles, RN since 11/03/2021 12:00 AM     Problem: RNCM: Development of Plan of Care for Chronic Disease Management and Care Coordination Needs   Priority: High  Onset Date: 11/03/2021     Long-Range Goal: RNCM: Effective Management of Plan of Care for Chronic Disease Management (HTN, HLD, COPD, Anxiety, Depression, Falls, Smoker, Chronic Pain)   Start Date: 11/03/2021  Expected End Date: 11/04/2022  Priority: High  Note:   Current Barriers:  Knowledge Deficits related to plan of care for management of HTN, HLD, COPD, Chronic Pain, and anxiety, depression, smoker, and falls  Care Coordination needs related to Mental Health Concerns   Chronic Disease Management support and education needs related to HTN, HLD, COPD, Chronic Pain, and anxiety, depression, smoker and falls prevention and safety Lacks caregiver support.        Difficulty obtaining medications Falls Recent respiratory event with a "panic attack" occurred on 10-28-2021  RNCM  Clinical Goal(s):  Patient will verbalize basic understanding of HTN, HLD, COPD, Anxiety, Depression, Tobacco Use, and chronic pain and falls and safety concerns disease process and self health management plan as evidenced by keeping appointments, following the plan of care, calling the office for changes or questions, and working with the CCM team to optimize health and well being. take all medications exactly as prescribed and will call provider for medication related questions as evidenced by taking medications as prescribed and calling for refills before running out of medications    attend all scheduled medical appointments: with pcp and specialist as evidenced by keeping appointments and calling for schedule change needs        demonstrate improved and ongoing adherence to prescribed treatment plan for HTN, HLD, COPD, Anxiety, Depression, Tobacco Use, and Chronic pain and smoker as evidenced by stable VS, routine lab work, following the plan of care and calling for changes in chronic conditions continue to work with Consulting civil engineer and/or Social Worker to address care management and care coordination needs related to HTN, HLD, COPD, Anxiety, Depression, Osteoarthritis, and smoker and falls and safety concerns as evidenced by adherence to CM Team Scheduled appointments     work with pharmacist to address Financial constraints related to paying for inhalers and Medication procurement related to COPD as evidenced by review of EMR and patient or pharmacist report    demonstrate ongoing self health care management ability for effective management of chronic conditions as evidenced by  working with the CCM team through collaboration with Consulting civil engineer,  provider, and care team.   Interventions: 1:1 collaboration with primary care provider regarding development and update of comprehensive plan of care as evidenced by provider attestation and co-signature Inter-disciplinary care team collaboration (see  longitudinal plan of care) Evaluation of current treatment plan related to  self management and patient's adherence to plan as established by provider   COPD: (Status: New goal.) Long Term Goal  Reviewed medications with patient, including use of prescribed maintenance and rescue inhalers, and provided instruction on medication management and the importance of adherence. 11-03-2021: The patient has an appointment with pharm D in July to assist with cost constraints related to Trelegy. The patient has a sample from the pcp office.  Provided patient with basic written and verbal COPD education on self care/management/and exacerbation prevention Advised patient to track and manage COPD triggers. 11-03-2021: Reviewed with the patient possible triggers. She had an episode last week where she was moving limbs and her breathing was bad and she had a "panic attack" and urinated on self. She said nothing has ever happened like that before. Reflective listening and support given. Provided written and verbal instructions on pursed lip breathing and utilized returned demonstration as teach back Provided instruction about proper use of medications used for management of COPD including inhalers Advised patient to self assesses COPD action plan zone and make appointment with provider if in the yellow zone for 48 hours without improvement Advised patient to engage in light exercise as tolerated 3-5 days a week to aid in the the management of COPD Provided education about and advised patient to utilize infection prevention strategies to reduce risk of respiratory infection Discussed the importance of adequate rest and management of fatigue with COPD. 11-03-2021: Education given to pace activities and not over exert self. Screening for signs and symptoms of depression related to chronic disease state  Assessed social determinant of health barriers  Falls:  (Status: New goal.) Long Term Goal  Provided written and verbal  education re: potential causes of falls and Fall prevention strategies Reviewed medications and discussed potential side effects of medications such as dizziness and frequent urination Advised patient of importance of notifying provider of falls Assessed for signs and symptoms of orthostatic hypotension Assessed for falls since last encounter. 11-03-2021: States last fall was on Christmas Day but has had some near misses. Education and support given.  Assessed patients knowledge of fall risk prevention secondary to previously provided education Provided patient information for fall alert systems Advised patient to discuss falls and safety prevention  with provider  Anxiety and Depression  (Status: New goal.) Long Term Goal  Evaluation of current treatment plan related to Anxiety and Depression, Mental Health Concerns  self-management and patient's adherence to plan as established by provider. Discussed plans with patient for ongoing care management follow up and provided patient with direct contact information for care management team Advised patient to write down question and concerns to ask the provider at today's visit. She believes she had a panic attack last Thursday and has been fearful to leave her house since; Provided education to patient re: mindfulness activities, doing hobbies she enjoys, socialization with friends, working through times when she is depressed and anxious and calling the office for new episodes of anxiety or depression; Reviewed medications with patient and discussed compliance she takes Wellbutrin and Zoloft and these help with her depression; Collaborated with office staff regarding the patient feels she needs to come in and be seen due to her anxiety and stress level.  Has an appointment to see Dr. Neomia Dear at 140 pm; Provided patient with working through anxiety and depression, and resources available to help with anxiety and depression  educational materials related to  chronic anxiety and depression; Reviewed scheduled/upcoming provider appointments including 11-03-2021 at 140 pm with Dr. Neomia Dear; Social Work referral for depression and anxiety resources and support. Is open to talking to the LCSW and has an appointment with LCSW on 11-24-2021 at 115 pm; Discussed plans with patient for ongoing care management follow up and provided patient with direct contact information for care management team; Advised patient to discuss her new concerns and the events that happened last week when she felt she had a "panic attack" with provider; Screening for signs and symptoms of depression related to chronic disease state;  Assessed social determinant of health barriers;   Hyperlipidemia:  (Status: New goal.) Long Term Goal  Lab Results  Component Value Date   CHOL 154 05/07/2021   HDL 44 05/07/2021   LDLCALC 86 05/07/2021   TRIG 133 05/07/2021   CHOLHDL 2.3 12/15/2020     Medication review performed; medication list updated in electronic medical record. 11-03-2021 the patient takes Crestor 40 mg QD Provider established cholesterol goals reviewed. 11-03-2021: The patient is at goal; Counseled on importance of regular laboratory monitoring as prescribed; Provided HLD educational materials. Putting information in AVS for office to print off and give to the patient; Reviewed role and benefits of statin for ASCVD risk reduction; Discussed strategies to manage statin-induced myalgias; Reviewed importance of limiting foods high in cholesterol; Reviewed exercise goals and target of 150 minutes per week;  Hypertension: (Status: New goal.) Long Term Goal  Last practice recorded BP readings:  BP Readings from Last 3 Encounters:  10/20/21 105/71  06/28/21 129/80  05/07/21 137/87  Most recent eGFR/CrCl:  Lab Results  Component Value Date   EGFR 70 06/30/2021    No components found for: CRCL  Evaluation of current treatment plan related to hypertension self management and  patient's adherence to plan as established by provider;   Provided education to patient re: stroke prevention, s/s of heart attack and stroke; Reviewed prescribed diet 11-03-2021: Heart healthy diet Reviewed medications with patient and discussed importance of compliance;  Discussed plans with patient for ongoing care management follow up and provided patient with direct contact information for care management team; Advised patient, providing education and rationale, to monitor blood pressure daily and record, calling PCP for findings outside established parameters;  Reviewed scheduled/upcoming provider appointments including:  Advised patient to discuss HTN and blood pressures trends with provider; Provided education on prescribed diet heart healthy;  Discussed complications of poorly controlled blood pressure such as heart disease, stroke, circulatory complications, vision complications, kidney impairment, sexual dysfunction;   Pain:  (Status: New goal.) Long Term Goal  Pain assessment performed. 11-03-2021: The patient has generalized pain in back, arms, neck, down right leg. Rates her pain level at a 6 today Medications reviewed. 11-03-2021: Is compliant with medications Reviewed provider established plan for pain management. 11-03-2021: The patient has see [facility] and they want her to have a MRI. The insurance will pay for it but [facility] states she has to pay 160.00 and she is not pleased with this since her insurance told her it was a covered expense. Encouraged the patient to call her insurance and inquire about other places she can go; Discussed importance of adherence to all scheduled medical appointments. 11-03-2021: An appointment with the pcp was secured for  today. Counseled on the importance of reporting any/all new or changed pain symptoms or management strategies to pain management provider; Advised patient to report to care team affect of pain on daily activities; Discussed use of  relaxation techniques and/or diversional activities to assist with pain reduction (distraction, imagery, relaxation, massage, acupressure, TENS, heat, and cold application; Reviewed with patient prescribed pharmacological and nonpharmacological pain relief strategies; Advised patient to discuss unresolved pain, changes in level and intensity of pain and other pain management options as [facility] is not working for her with provider;   Smoking Cessation: (Status: New goal.) Long Term Goal  Reviewed smoking history:  tobacco abuse of 53 years; currently smoking 1 ppd (In past smoked 2 ppd) Previous quit attempts, unsuccessful- has never tried to quit, successful using has never tried to quit  Reports smoking within 30 minutes of waking up Reports triggers to smoke include: dog that is 88 years old not doing good and "my nerves are shot", own health conditions, and stressors in her life Reports motivation to quit smoking includes: none at this time On a scale of 1-10, reports MOTIVATION to quit is 0 On a scale of 1-10, reports CONFIDENCE in quitting is 0  Evaluation of current treatment plan reviewed; Advised patient to discuss smoking cessation options with provider; Provided contact information for [place] Quit Line (1-800-QUIT-NOW); Reviewed smoking cessation techniques: removing cigarettes and smoking materials from environment, stress management, substitution of other forms of reinforcement, support of family/friends, and written materials Discussed plans with patient for ongoing care management follow up and provided patient with direct contact information for care management team; Advised patient to discuss options for smoking cessation with provider when she is ready to quit smoking.  Patient Goals/Self-Care Activities: Take medications as prescribed   Attend all scheduled provider appointments Call pharmacy for medication refills 3-7 days in advance of running out of medications Attend  church or other social activities Perform all self care activities independently  Perform IADL's (shopping, preparing meals, housekeeping, managing finances) independently Call provider office for new concerns or questions  Work with the social worker to address care coordination needs and will continue to work with the clinical team to address health care and disease management related needs call the Suicide and Crisis Lifeline: 988 call the [place] National Suicide Prevention Lifeline: 201-751-4723 or TTY: (223) 837-1008 TTY 2726624105) to talk to a trained counselor call 1-800-273-TALK (toll free, 24 hour hotline) if experiencing a Mental Health or [place]  avoid second hand smoke eliminate smoking in my home identify and avoid work-related triggers identify and remove indoor air pollutants limit outdoor activity during cold weather listen for public air quality announcements every day do breathing exercises every day begin a symptom diary develop a rescue plan eliminate symptom triggers at home follow rescue plan if symptoms flare-up keep follow-up appointments: see specialist and pcp on a regular basis use an extra pillow to sleep develop a new routine to improve sleep don't eat or exercise right before bedtime eat healthy/prescribed diet: heart healthy get at least 7 to 8 hours of sleep at night use devices that will help like a cane, sock-puller or reacher practice relaxation or meditation daily do exercises in a comfortable position that makes breathing as easy as possible check blood pressure weekly choose a place to take my blood pressure (home, clinic or office, retail store) write blood pressure results in a log or diary learn about high blood pressure keep a blood pressure log take blood  pressure log to all doctor appointments call doctor for signs and symptoms of high blood pressure develop an action plan for high blood pressure keep all doctor  appointments take medications for blood pressure exactly as prescribed report new symptoms to your doctor eat more whole grains, fruits and vegetables, lean meats and healthy fats - call for medicine refill 2 or 3 days before it runs out - take all medications exactly as prescribed - call doctor with any symptoms you believe are related to your medicine - call doctor when you experience any new symptoms - go to all doctor appointments as scheduled - adhere to prescribed diet: Heart Healthy       Consent to CCM Services: Ms. Demuro was given information about Chronic Care Management services including:  CCM service includes personalized support from designated clinical staff supervised by her physician, including individualized plan of care and coordination with other care providers 24/7 contact phone numbers for assistance for urgent and routine care needs. Service will only be billed when office clinical staff spend 20 minutes or more in a month to coordinate care. Only one practitioner may furnish and bill the service in a calendar month. The patient may stop CCM services at any time (effective at the end of the month) by phone call to the office staff. The patient will be responsible for cost sharing (co-pay) of up to 20% of the service fee (after annual deductible is met).  Patient agreed to services and verbal consent obtained.   Print copy of patient instructions, educational materials, and care plan provided in person.   Telephone follow up appointment with care management team member scheduled for: 12-15-2021 at 6 pm  Noreene Larsson RN, MSN, [facility] Mobile: [phone]   Managing the Challenge of Quitting Smoking Quitting smoking is a physical and mental challenge. You may have cravings, withdrawal symptoms, and temptation to smoke. Before quitting, work with your health care provider to  make a plan that can help you manage quitting. Making a plan before you quit may keep you from smoking when you have the urge to smoke while trying to quit. How to manage lifestyle changes Managing stress Stress can make you want to smoke, and wanting to smoke may cause stress. It is important to find ways to manage your stress. You could try some of the following: Practice relaxation techniques. Breathe slowly and deeply, in through your nose and out through your mouth. Listen to music. Soak in a bath or take a shower. Imagine a peaceful place or vacation. Get some support. Talk with family or friends about your stress. Join a support group. Talk with a counselor or therapist. Get some physical activity. Go for a walk, run, or bike ride. Play a favorite sport. Practice yoga.  Medicines Talk with your health care provider about medicines that might help you deal with cravings and make quitting easier for you. Relationships Social situations can be difficult when you are quitting smoking. To manage this, you can: Avoid parties and other social situations where people might be smoking. Avoid alcohol. Leave right away if you have the urge to smoke. Explain to your family and friends that you are quitting smoking. Ask for support and let them know you might be a bit grumpy. Plan activities where smoking is not an option. General instructions Be aware that many people gain weight after they quit smoking. However, not everyone does. To keep from  gaining weight, have a plan in place before you quit, and stick to the plan after you quit. Your plan should include: Eating healthy snacks. When you have a craving, it may help to: Eat popcorn, or try carrots, celery, or other cut vegetables. Chew sugar-free gum. Changing how you eat. Eat small portion sizes at meals. Eat 4-6 small meals throughout the day instead of 1-2 large meals a day. Be mindful when you eat. You should avoid watching  television or doing other things that might distract you as you eat. Exercising regularly. Make time to exercise each day. If you do not have time for a long workout, do short bouts of exercise for 5-10 minutes several times a day. Do some form of strengthening exercise, such as weight lifting. Do some exercise that gets your heart beating and causes you to breathe deeply, such as walking fast, running, swimming, or biking. This is very important. Drinking plenty of water or other low-calorie or no-calorie drinks. Drink enough fluid to keep your urine pale yellow.  How to recognize withdrawal symptoms Your body and mind may experience discomfort as you try to get used to not having nicotine in your system. These effects are called withdrawal symptoms. They may include: Feeling hungrier than normal. Having trouble concentrating. Feeling irritable or restless. Having trouble sleeping. Feeling depressed. Craving a cigarette. These symptoms may surprise you, but they are normal to have when quitting smoking. To manage withdrawal symptoms: Avoid places, people, and activities that trigger your cravings. Remember why you want to quit. Get plenty of sleep. Avoid coffee and other drinks that contain caffeine. These may worsen some of your symptoms. How to manage cravings Come up with a plan for how to deal with your cravings. The plan should include the following: A definition of the specific situation you want to deal with. An activity or action you will take to replace smoking. A clear idea for how this action will help. The name of someone who could help you with this. Cravings usually last for 5-10 minutes. Consider taking the following actions to help you with your plan to deal with cravings: Keep your mouth busy. Chew sugar-free gum. Suck on hard candies or a straw. Brush your teeth. Keep your hands and body busy. Change to a different activity right away. Squeeze or play with a  ball. Do an activity or a hobby, such as making bead jewelry, practicing needlepoint, or working with wood. Mix up your normal routine. Take a short exercise break. Go for a quick walk, or run up and down stairs. Focus on doing something kind or helpful for someone else. Call a friend or family member to talk during a craving. Join a support group. Contact a quitline. Where to find support To get help or find a support group: Call the [employer]'s Smoking Quitline: 1-800-QUIT-NOW 805-860-7550) Text QUIT to SmokefreeTXT: 454098 Where to find more information Visit these websites to find more information on quitting smoking: U.[place]. Department of Health and Human Services: www.smokefree.gov American Lung Association: www.freedomfromsmoking.org Centers for Disease Control and Prevention (CDC): http://www.wolf.info/ American Heart Association: www.heart.org Contact a health care provider if: You want to change your plan for quitting. The medicines you are taking are not helping. Your eating feels out of control or you cannot sleep. You feel depressed or become very anxious. Summary Quitting smoking is a physical and mental challenge. You will face cravings, withdrawal symptoms, and temptation to smoke again. Preparation can help you as you  go through these challenges. Try different techniques to manage stress, handle social situations, and prevent weight gain. You can deal with cravings by keeping your mouth busy (such as by chewing gum), keeping your hands and body busy, calling family or friends, or contacting a quitline for people who want to quit smoking. You can deal with withdrawal symptoms by avoiding places where people smoke, getting plenty of rest, and avoiding drinks that contain caffeine. This information is not intended to replace advice given to you by your health care provider. Make sure you discuss any questions you have with your health care provider. Document Revised:  05/07/2021 Document Reviewed: 05/07/2021 Elsevier Patient Education  [place]. Mindfulness-Based Stress Reduction Mindfulness-based stress reduction (MBSR) is a program that helps people learn to practice mindfulness. Mindfulness is the practice of consciously paying attention to the present moment. MBSR focuses on developing self-awareness, which lets you respond to life stress without judgment or negative feelings. It can be learned and practiced through techniques such as education, breathing exercises, meditation, and yoga. MBSR includes several mindfulness techniques in one program. MBSR works best when you understand the treatment, are willing to try new things, and can commit to spending time practicing what you learn. MBSR training may include learning about: How your feelings, thoughts, and reactions affect your body. New ways to respond to things that cause negative thoughts to start (triggers). How to notice your thoughts and let go of them. Practicing awareness of everyday things that you normally do without thinking. The techniques and goals of different types of meditation. What are the benefits of MBSR? MBSR can have many benefits, which include helping you to: Develop self-awareness. This means knowing and understanding yourself. Learn skills and attitudes that help you to take part in your own health care. Learn new ways to care for yourself. Be more accepting about how things are, and let things go. Be less judgmental and approach things with an open mind. Be patient with yourself and trust yourself more. MBSR has also been shown to: Reduce negative emotions, such as sadness, overwhelm, and worry. Improve memory and focus. Change how you sense and react to pain. Boost your body's ability to fight infections. Help you connect better with other people. Improve your sense of well-being. How to practice mindfulness To do a basic awareness exercise: Find a  comfortable place to sit. Pay attention to the present moment. Notice your thoughts, feelings, and surroundings just as they are. Avoid judging yourself, your feelings, or your surroundings. Make note of any judgment that comes up and let it go. Your mind may wander, and that is okay. Make note of when your thoughts drift, and return your attention to the present moment. To do basic mindfulness meditation: Find a comfortable place to sit. This may include a stable chair or a firm floor cushion. Sit upright with your back straight. Let your arms fall next to your sides, with your hands resting on your legs. If you are sitting in a chair, rest your feet flat on the floor. If you are sitting on a cushion, cross your legs in front of you. Keep your head in a neutral position with your chin dropped slightly. Relax your jaw and rest the tip of your tongue on the roof of your mouth. Drop your gaze to the floor or close your eyes. Breathe normally and pay attention to your breath. Feel the air moving in and out of your nose. Feel your belly expanding and relaxing  with each breath. Your mind may wander, and that is okay. Make note of when your thoughts drift, and return your attention to your breath. Avoid judging yourself, your feelings, or your surroundings. Make note of any judgment or feelings that come up, let them go, and bring your attention back to your breath. When you are ready, lift your gaze or open your eyes. Pay attention to how your body feels after the meditation. Follow these instructions at home:  Find a local in-person or online MBSR program. Set aside some time regularly for mindfulness practice. Practice every day if you can. Even 10 minutes of practice is helpful. Find a mindfulness practice that works best for you. This may include one or more of the following: Meditation. This involves focusing your mind on a certain thought or activity. Breathing awareness exercises. These help  you to stay present by focusing on your breath. Body scan. For this practice, you lie down and pay attention to each part of your body from head to toe. You can identify tension and soreness and consciously relax parts of your body. Yoga. Yoga involves stretching and breathing, and it can improve your ability to move and be flexible. It can also help you to test your body's limits, which can help you release stress. Mindful eating. This way of eating involves focusing on the taste, texture, color, and smell of each bite of food. This slows down eating and helps you feel full sooner. For this reason, it can be an important part of a weight loss plan. Find a podcast or recording that provides guidance for breathing awareness, body scan, or meditation exercises. You can listen to these any time when you have a free moment to rest without distractions. Follow your treatment plan as told by your health care provider. This may include taking regular medicines and making changes to your diet or lifestyle as recommended. Where to find more information You can find more information about MBSR from: Your health care provider. Community-based meditation centers or programs. Programs offered near you. Summary Mindfulness-based stress reduction (MBSR) is a program that teaches you how to consciously pay attention to the present moment. It is used to help you deal better with daily stress, feelings, and pain. MBSR focuses on developing self-awareness, which allows you to respond to life stress without judgment or negative feelings. MBSR programs may involve learning different mindfulness practices, such as breathing exercises, meditation, yoga, body scan, or mindful eating. Find a mindfulness practice that works best for you, and set aside time for it on a regular basis. This information is not intended to replace advice given to you by your health care provider. Make sure you discuss any questions you have with  your health care provider. Document Revised: 12/24/2020 Document Reviewed: 12/24/2020 [employer] Patient Education  [place].

## 2021-11-03 NOTE — Chronic Care Management (AMB) (Signed)
Chronic Care Management   CCM RN Visit Note  11/03/2021 Name: Caitlyn May MRN: [id number] DOB: 1956-05-09  Subjective: Caitlyn May is a 66 y.o. year old female who is a primary care patient of Cannady, Barbaraann Faster, NP. The care management team was consulted for assistance with disease management and care coordination needs.    Engaged with patient by telephone for initial visit in response to provider referral for case management and/or care coordination services.   Consent to Services:  The patient was given the following information about Chronic Care Management services today, agreed to services, and gave verbal consent: 1. CCM service includes personalized support from designated clinical staff supervised by the primary care provider, including individualized plan of care and coordination with other care providers 2. 24/7 contact phone numbers for assistance for urgent and routine care needs. 3. Service will only be billed when office clinical staff spend 20 minutes or more in a month to coordinate care. 4. Only one practitioner may furnish and bill the service in a calendar month. 5.The patient may stop CCM services at any time (effective at the end of the month) by phone call to the office staff. 6. The patient will be responsible for cost sharing (co-pay) of up to 20% of the service fee (after annual deductible is met). Patient agreed to services and consent obtained.  Patient agreed to services and verbal consent obtained.   Assessment: Review of patient past medical history, allergies, medications, health status, including review of consultants reports, laboratory and other test data, was performed as part of comprehensive evaluation and provision of chronic care management services.   SDOH (Social Determinants of Health) assessments and interventions performed:  SDOH Interventions    Flowsheet Row Most Recent Value  SDOH Interventions   Financial Strain Interventions Other  (Comment)  [cost constraints related to Trelegy]  Social Connections Interventions Other (Comment)  [has good friends that check on her frequently]        CCM Care Plan  Allergies  Allergen Reactions   Accupril [Quinapril Hcl]    Avelox [Moxifloxacin]    Ciprofloxacin    Hctz [Hydrochlorothiazide] Other (See Comments)    Leg swelling   Penicillins    Sulfa Antibiotics     Outpatient Encounter Medications as of 11/03/2021  Medication Sig   albuterol (VENTOLIN HFA) 108 (90 Base) MCG/ACT inhaler Inhale 2 puffs into the lungs every 6 (six) hours as needed for wheezing or shortness of breath.   amLODipine (NORVASC) 5 MG tablet Take 1 tablet (5 mg total) by mouth daily.   benazepril (LOTENSIN) 40 MG tablet Take 1 tablet (40 mg total) by mouth daily.   buPROPion (WELLBUTRIN XL) 150 MG 24 hr tablet Take 1 tablet (150 mg total) by mouth daily.   carvedilol (COREG) 12.5 MG tablet Take 1 tablet (12.5 mg total) by mouth 2 (two) times daily.   Fluticasone-Umeclidin-Vilant (TRELEGY ELLIPTA) 100-62.5-25 MCG/ACT AEPB Inhale 1 puff into the lungs daily.   gabapentin (NEURONTIN) 300 MG capsule Take 1 capsule (300 mg total) by mouth 3 (three) times daily.   megestrol (MEGACE) 40 MG tablet Take 1 tablet (40 mg total) by mouth 4 (four) times daily as needed.   montelukast (SINGULAIR) 10 MG tablet Take 1 tablet (10 mg total) by mouth at bedtime.   ondansetron (ZOFRAN ODT) 8 MG disintegrating tablet Take 1 tablet (8 mg total) by mouth every 8 (eight) hours as needed for nausea or vomiting. (Patient not taking: Reported on 10/20/2021)  pantoprazole (PROTONIX) 40 MG tablet Take 1 tablet (40 mg total) by mouth at bedtime.   rosuvastatin (CRESTOR) 40 MG tablet Take 1 tablet (40 mg total) by mouth daily.   sertraline (ZOLOFT) 100 MG tablet Take 1.5 tablets (150 mg total) by mouth daily.   tiZANidine (ZANAFLEX) 4 MG tablet Take 1 tablet (4 mg total) by mouth every 6 (six) hours as needed for muscle spasms.   No  facility-administered encounter medications on file as of 11/03/2021.    Patient Active Problem List   Diagnosis Date Noted   Aortic atherosclerosis ([place]) 08/19/2020   Centrilobular emphysema ([place]) 08/20/2019   Anxiety 04/19/2018   Allergic rhinitis 09/28/2017   Advanced care planning/counseling discussion 04/14/2017   Vitamin D deficiency 02/07/2017   Osteoarthritis of hip (Bilateral) 11/29/2016   Chronic sacroiliac joint pain (Bilateral) 11/29/2016   Chronic pain syndrome 11/28/2016   Failed back surgical syndrome 11/28/2016   Chronic low back pain (Location of Primary Source of Pain) (Bilateral) (R>L) 11/28/2016   Chronic lower extremity pain (Bilateral) (R>L) 11/28/2016   Chronic neck pain (Location of Secondary source of pain) (Bilateral) (R>L) 11/28/2016   DDD (degenerative disc disease), lumbar 11/28/2016   Elevated TSH 07/08/2015   Acid reflux 11/19/2014   Depression, recurrent (HCC)    Hyperlipidemia    Hypertension    Postlaminectomy syndrome, lumbar region 11/17/2014    Conditions to be addressed/monitored:HTN, HLD, COPD, Anxiety, Depression, Osteoarthritis, Tobacco Use, and Chronic pain, falls and safety   Care Plan : RNCM; General Plan of Care (Adult) for Chronic Disease Management and Care Coordination Needs  Updates made by Vanita Ingles, RN since 11/03/2021 12:00 AM     Problem: RNCM: Development of Plan of Care for Chronic Disease Management and Care Coordination Needs   Priority: High  Onset Date: 11/03/2021     Long-Range Goal: RNCM: Effective Management of Plan of Care for Chronic Disease Management (HTN, HLD, COPD, Anxiety, Depression, Falls, Smoker, Chronic Pain)   Start Date: 11/03/2021  Expected End Date: 11/04/2022  Priority: High  Note:   Current Barriers:  Knowledge Deficits related to plan of care for management of HTN, HLD, COPD, Chronic Pain, and anxiety, depression, smoker, and falls  Care Coordination needs related to Mental Health Concerns    Chronic Disease Management support and education needs related to HTN, HLD, COPD, Chronic Pain, and anxiety, depression, smoker and falls prevention and safety Lacks caregiver support.        Difficulty obtaining medications Falls Recent respiratory event with a "panic attack" occurred on 10-28-2021  RNCM Clinical Goal(s):  Patient will verbalize basic understanding of HTN, HLD, COPD, Anxiety, Depression, Tobacco Use, and chronic pain and falls and safety concerns disease process and self health management plan as evidenced by keeping appointments, following the plan of care, calling the office for changes or questions, and working with the CCM team to optimize health and well being. take all medications exactly as prescribed and will call provider for medication related questions as evidenced by taking medications as prescribed and calling for refills before running out of medications    attend all scheduled medical appointments: with pcp and specialist as evidenced by keeping appointments and calling for schedule change needs        demonstrate improved and ongoing adherence to prescribed treatment plan for HTN, HLD, COPD, Anxiety, Depression, Tobacco Use, and Chronic pain and smoker as evidenced by stable VS, routine lab work, following the plan of care and calling for changes in  chronic conditions continue to work with Consulting civil engineer and/or Social Worker to address care management and care coordination needs related to HTN, HLD, COPD, Anxiety, Depression, Osteoarthritis, and smoker and falls and safety concerns as evidenced by adherence to CM Team Scheduled appointments     work with pharmacist to address Financial constraints related to paying for inhalers and Medication procurement related to COPD as evidenced by review of EMR and patient or pharmacist report    demonstrate ongoing self health care management ability for effective management of chronic conditions as evidenced by  working with the  CCM team through collaboration with Consulting civil engineer, provider, and care team.   Interventions: 1:1 collaboration with primary care provider regarding development and update of comprehensive plan of care as evidenced by provider attestation and co-signature Inter-disciplinary care team collaboration (see longitudinal plan of care) Evaluation of current treatment plan related to  self management and patient's adherence to plan as established by provider   COPD: (Status: New goal.) Long Term Goal  Reviewed medications with patient, including use of prescribed maintenance and rescue inhalers, and provided instruction on medication management and the importance of adherence. 11-03-2021: The patient has an appointment with pharm D in July to assist with cost constraints related to Trelegy. The patient has a sample from the pcp office.  Provided patient with basic written and verbal COPD education on self care/management/and exacerbation prevention Advised patient to track and manage COPD triggers. 11-03-2021: Reviewed with the patient possible triggers. She had an episode last week where she was moving limbs and her breathing was bad and she had a "panic attack" and urinated on self. She said nothing has ever happened like that before. Reflective listening and support given. Provided written and verbal instructions on pursed lip breathing and utilized returned demonstration as teach back Provided instruction about proper use of medications used for management of COPD including inhalers Advised patient to self assesses COPD action plan zone and make appointment with provider if in the yellow zone for 48 hours without improvement Advised patient to engage in light exercise as tolerated 3-5 days a week to aid in the the management of COPD Provided education about and advised patient to utilize infection prevention strategies to reduce risk of respiratory infection Discussed the importance of adequate rest and  management of fatigue with COPD. 11-03-2021: Education given to pace activities and not over exert self. Screening for signs and symptoms of depression related to chronic disease state  Assessed social determinant of health barriers  Falls:  (Status: New goal.) Long Term Goal  Provided written and verbal education re: potential causes of falls and Fall prevention strategies Reviewed medications and discussed potential side effects of medications such as dizziness and frequent urination Advised patient of importance of notifying provider of falls Assessed for signs and symptoms of orthostatic hypotension Assessed for falls since last encounter. 11-03-2021: States last fall was on Christmas Day but has had some near misses. Education and support given.  Assessed patients knowledge of fall risk prevention secondary to previously provided education Provided patient information for fall alert systems Advised patient to discuss falls and safety prevention  with provider  Anxiety and Depression  (Status: New goal.) Long Term Goal  Evaluation of current treatment plan related to Anxiety and Depression, Mental Health Concerns  self-management and patient's adherence to plan as established by provider. Discussed plans with patient for ongoing care management follow up and provided patient with direct contact information for care management team  Advised patient to write down question and concerns to ask the provider at today's visit. She believes she had a panic attack last Thursday and has been fearful to leave her house since; Provided education to patient re: mindfulness activities, doing hobbies she enjoys, socialization with friends, working through times when she is depressed and anxious and calling the office for new episodes of anxiety or depression; Reviewed medications with patient and discussed compliance she takes Wellbutrin and Zoloft and these help with her depression; Collaborated with office  staff regarding the patient feels she needs to come in and be seen due to her anxiety and stress level. Has an appointment to see Dr. Neomia Dear at 140 pm; Provided patient with working through anxiety and depression, and resources available to help with anxiety and depression  educational materials related to chronic anxiety and depression; Reviewed scheduled/upcoming provider appointments including 11-03-2021 at 140 pm with Dr. Neomia Dear; Social Work referral for depression and anxiety resources and support. Is open to talking to the LCSW and has an appointment with LCSW on 11-24-2021 at 115 pm; Discussed plans with patient for ongoing care management follow up and provided patient with direct contact information for care management team; Advised patient to discuss her new concerns and the events that happened last week when she felt she had a "panic attack" with provider; Screening for signs and symptoms of depression related to chronic disease state;  Assessed social determinant of health barriers;   Hyperlipidemia:  (Status: New goal.) Long Term Goal  Lab Results  Component Value Date   CHOL 154 05/07/2021   HDL 44 05/07/2021   LDLCALC 86 05/07/2021   TRIG 133 05/07/2021   CHOLHDL 2.3 12/15/2020     Medication review performed; medication list updated in electronic medical record. 11-03-2021 the patient takes Crestor 40 mg QD Provider established cholesterol goals reviewed. 11-03-2021: The patient is at goal; Counseled on importance of regular laboratory monitoring as prescribed; Provided HLD educational materials. Putting information in AVS for office to print off and give to the patient; Reviewed role and benefits of statin for ASCVD risk reduction; Discussed strategies to manage statin-induced myalgias; Reviewed importance of limiting foods high in cholesterol; Reviewed exercise goals and target of 150 minutes per week;  Hypertension: (Status: New goal.) Long Term Goal  Last practice recorded BP  readings:  BP Readings from Last 3 Encounters:  10/20/21 105/71  06/28/21 129/80  05/07/21 137/87  Most recent eGFR/CrCl:  Lab Results  Component Value Date   EGFR 70 06/30/2021    No components found for: CRCL  Evaluation of current treatment plan related to hypertension self management and patient's adherence to plan as established by provider;   Provided education to patient re: stroke prevention, s/s of heart attack and stroke; Reviewed prescribed diet 11-03-2021: Heart healthy diet Reviewed medications with patient and discussed importance of compliance;  Discussed plans with patient for ongoing care management follow up and provided patient with direct contact information for care management team; Advised patient, providing education and rationale, to monitor blood pressure daily and record, calling PCP for findings outside established parameters;  Reviewed scheduled/upcoming provider appointments including:  Advised patient to discuss HTN and blood pressures trends with provider; Provided education on prescribed diet heart healthy;  Discussed complications of poorly controlled blood pressure such as heart disease, stroke, circulatory complications, vision complications, kidney impairment, sexual dysfunction;   Pain:  (Status: New goal.) Long Term Goal  Pain assessment performed. 11-03-2021: The patient has generalized pain in  back, arms, neck, down right leg. Rates her pain level at a 6 today Medications reviewed. 11-03-2021: Is compliant with medications Reviewed provider established plan for pain management. 11-03-2021: The patient has see [facility] and they want her to have a MRI. The insurance will pay for it but [facility] states she has to pay 160.00 and she is not pleased with this since her insurance told her it was a covered expense. Encouraged the patient to call her insurance and inquire about other places she can go; Discussed importance of adherence to all scheduled medical  appointments. 11-03-2021: An appointment with the pcp was secured for today. Counseled on the importance of reporting any/all new or changed pain symptoms or management strategies to pain management provider; Advised patient to report to care team affect of pain on daily activities; Discussed use of relaxation techniques and/or diversional activities to assist with pain reduction (distraction, imagery, relaxation, massage, acupressure, TENS, heat, and cold application; Reviewed with patient prescribed pharmacological and nonpharmacological pain relief strategies; Advised patient to discuss unresolved pain, changes in level and intensity of pain and other pain management options as [facility] is not working for her with provider;   Smoking Cessation: (Status: New goal.) Long Term Goal  Reviewed smoking history:  tobacco abuse of 53 years; currently smoking 1 ppd (In past smoked 2 ppd) Previous quit attempts, unsuccessful- has never tried to quit, successful using has never tried to quit  Reports smoking within 30 minutes of waking up Reports triggers to smoke include: dog that is 24 years old not doing good and "my nerves are shot", own health conditions, and stressors in her life Reports motivation to quit smoking includes: none at this time On a scale of 1-10, reports MOTIVATION to quit is 0 On a scale of 1-10, reports CONFIDENCE in quitting is 0  Evaluation of current treatment plan reviewed; Advised patient to discuss smoking cessation options with provider; Provided contact information for  Quit Line (1-800-QUIT-NOW); Reviewed smoking cessation techniques: removing cigarettes and smoking materials from environment, stress management, substitution of other forms of reinforcement, support of family/friends, and written materials Discussed plans with patient for ongoing care management follow up and provided patient with direct contact information for care management team; Advised patient to  discuss options for smoking cessation with provider when she is ready to quit smoking.  Patient Goals/Self-Care Activities: Take medications as prescribed   Attend all scheduled provider appointments Call pharmacy for medication refills 3-7 days in advance of running out of medications Attend church or other social activities Perform all self care activities independently  Perform IADL's (shopping, preparing meals, housekeeping, managing finances) independently Call provider office for new concerns or questions  Work with the social worker to address care coordination needs and will continue to work with the clinical team to address health care and disease management related needs call the Suicide and Crisis Lifeline: 988 call the [place] National Suicide Prevention Lifeline: (780) 301-1939 or TTY: 769 394 8203 TTY 973-227-0399) to talk to a trained counselor call 1-800-273-TALK (toll free, 24 hour hotline) if experiencing a Mental Health or [place]  avoid second hand smoke eliminate smoking in my home identify and avoid work-related triggers identify and remove indoor air pollutants limit outdoor activity during cold weather listen for public air quality announcements every day do breathing exercises every day begin a symptom diary develop a rescue plan eliminate symptom triggers at home follow rescue plan if symptoms flare-up keep follow-up appointments: see specialist and pcp  on a regular basis use an extra pillow to sleep develop a new routine to improve sleep don't eat or exercise right before bedtime eat healthy/prescribed diet: heart healthy get at least 7 to 8 hours of sleep at night use devices that will help like a cane, sock-puller or reacher practice relaxation or meditation daily do exercises in a comfortable position that makes breathing as easy as possible check blood pressure weekly choose a place to take my blood pressure (home, clinic or office,  retail store) write blood pressure results in a log or diary learn about high blood pressure keep a blood pressure log take blood pressure log to all doctor appointments call doctor for signs and symptoms of high blood pressure develop an action plan for high blood pressure keep all doctor appointments take medications for blood pressure exactly as prescribed report new symptoms to your doctor eat more whole grains, fruits and vegetables, lean meats and healthy fats - call for medicine refill 2 or 3 days before it runs out - take all medications exactly as prescribed - call doctor with any symptoms you believe are related to your medicine - call doctor when you experience any new symptoms - go to all doctor appointments as scheduled - adhere to prescribed diet: Heart Healthy       Plan:Telephone follow up appointment with care management team member scheduled for:  12-15-2021 at 230 pm  [place], MSN, [facility] Mobile: [phone]

## 2021-11-05 ENCOUNTER — Telehealth: Payer: Self-pay

## 2021-11-05 ENCOUNTER — Ambulatory Visit: Payer: Medicare HMO | Admitting: Nurse Practitioner

## 2021-11-05 NOTE — Chronic Care Management (AMB) (Cosign Needed)
    Chronic Care Management Pharmacy Assistant   Name: Caitlyn May  MRN: [id number] DOB: [birth year]   Reason for Encounter: Patient assistance application on Trelegy.    Medications: Outpatient Encounter Medications as of 11/05/2021  Medication Sig   albuterol (VENTOLIN HFA) 108 (90 Base) MCG/ACT inhaler Inhale 2 puffs into the lungs every 6 (six) hours as needed for wheezing or shortness of breath.   amLODipine (NORVASC) 5 MG tablet Take 1 tablet (5 mg total) by mouth daily.   benazepril (LOTENSIN) 40 MG tablet Take 1 tablet (40 mg total) by mouth daily.   buPROPion (WELLBUTRIN XL) 150 MG 24 hr tablet Take 1 tablet (150 mg total) by mouth daily.   carvedilol (COREG) 12.5 MG tablet Take 1 tablet (12.5 mg total) by mouth 2 (two) times daily.   Fluticasone-Umeclidin-Vilant (TRELEGY ELLIPTA) 100-62.5-25 MCG/ACT AEPB Inhale 1 puff into the lungs daily.   gabapentin (NEURONTIN) 300 MG capsule Take 1 capsule (300 mg total) by mouth 3 (three) times daily.   hydrOXYzine (VISTARIL) 25 MG capsule Take 1 capsule (25 mg total) by mouth every 8 (eight) hours as needed.   megestrol (MEGACE) 40 MG tablet Take 1 tablet (40 mg total) by mouth 4 (four) times daily as needed.   montelukast (SINGULAIR) 10 MG tablet Take 1 tablet (10 mg total) by mouth at bedtime.   ondansetron (ZOFRAN ODT) 8 MG disintegrating tablet Take 1 tablet (8 mg total) by mouth every 8 (eight) hours as needed for nausea or vomiting.   pantoprazole (PROTONIX) 40 MG tablet Take 1 tablet (40 mg total) by mouth at bedtime.   rosuvastatin (CRESTOR) 40 MG tablet Take 1 tablet (40 mg total) by mouth daily.   sertraline (ZOLOFT) 100 MG tablet Take 1.5 tablets (150 mg total) by mouth daily.   tiZANidine (ZANAFLEX) 4 MG tablet Take 1 tablet (4 mg total) by mouth every 6 (six) hours as needed for muscle spasms.   No facility-administered encounter medications on file as of 11/05/2021.    I have prefilled out the application for Trelegy. I  have attempted to reach out the patient about starting on her application but she did not answer. I have left my contact information in order for her to call back to start the application process.    Corrie Mckusick, [place]

## 2021-11-06 NOTE — Patient Instructions (Incomplete)

## 2021-11-08 ENCOUNTER — Ambulatory Visit: Payer: Medicare HMO | Admitting: Nurse Practitioner

## 2021-11-24 ENCOUNTER — Ambulatory Visit: Payer: Medicare HMO | Admitting: Licensed Clinical Social Worker

## 2021-11-24 DIAGNOSIS — J449 Chronic obstructive pulmonary disease, unspecified: Secondary | ICD-10-CM

## 2021-11-24 DIAGNOSIS — F339 Major depressive disorder, recurrent, unspecified: Secondary | ICD-10-CM

## 2021-11-24 DIAGNOSIS — G894 Chronic pain syndrome: Secondary | ICD-10-CM

## 2021-11-24 DIAGNOSIS — F419 Anxiety disorder, unspecified: Secondary | ICD-10-CM

## 2021-11-26 DIAGNOSIS — E785 Hyperlipidemia, unspecified: Secondary | ICD-10-CM | POA: Diagnosis not present

## 2021-11-26 DIAGNOSIS — F32A Depression, unspecified: Secondary | ICD-10-CM

## 2021-11-26 DIAGNOSIS — F17210 Nicotine dependence, cigarettes, uncomplicated: Secondary | ICD-10-CM | POA: Diagnosis not present

## 2021-11-26 DIAGNOSIS — J449 Chronic obstructive pulmonary disease, unspecified: Secondary | ICD-10-CM

## 2021-11-26 DIAGNOSIS — I10 Essential (primary) hypertension: Secondary | ICD-10-CM | POA: Diagnosis not present

## 2021-11-28 NOTE — Patient Instructions (Signed)

## 2021-11-29 ENCOUNTER — Encounter: Payer: Self-pay | Admitting: Nurse Practitioner

## 2021-11-29 ENCOUNTER — Telehealth: Payer: Self-pay

## 2021-11-29 ENCOUNTER — Ambulatory Visit: Payer: Medicare HMO | Admitting: Nurse Practitioner

## 2021-11-29 VITALS — BP 128/80 | HR 68 | Temp 98.0°F | Ht 66.0 in | Wt 126.8 lb

## 2021-11-29 DIAGNOSIS — I10 Essential (primary) hypertension: Secondary | ICD-10-CM | POA: Diagnosis not present

## 2021-11-29 DIAGNOSIS — J432 Centrilobular emphysema: Secondary | ICD-10-CM | POA: Diagnosis not present

## 2021-11-29 DIAGNOSIS — R55 Syncope and collapse: Secondary | ICD-10-CM | POA: Diagnosis not present

## 2021-11-29 MED ORDER — OLMESARTAN MEDOXOMIL 40 MG PO TABS
40.0000 mg | ORAL_TABLET | Freq: Every day | ORAL | 4 refills | Status: DC
Start: 2021-11-29 — End: 2022-01-10

## 2021-11-29 NOTE — Assessment & Plan Note (Signed)
Chronic, stable.  BP at goal today in office.  Recommend she monitor BP at least a few mornings a week at home and document.  DASH diet at home.  Due to her COPD will stop Benazepril and change to Olmesartan to allow for ongoing kidney protection, but less affect on COPD.  Labs today: up to date.  Continue collaboration with cardiology as needed.  Recommend complete cessation of smoking.
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
## 2021-11-29 NOTE — Chronic Care Management (AMB) (Signed)
Chronic Care Management    Clinical Social Work Note  11/29/2021 Name: Caitlyn May MRN: [id number] DOB: 12-Dec-1955  Caitlyn May is a 66 y.o. year old female who is a primary care patient of Cannady, Caitlyn Faster, NP. The CCM team was consulted to assist the patient with chronic disease management and/or care coordination needs related to: [employer]  and [place] and Resources.   Engaged with patient by telephone for initial visit in response to provider referral for social work chronic care management and care coordination services.   Consent to Services:  The patient was given the following information about Chronic Care Management services today, agreed to services, and gave verbal consent: 1. CCM service includes personalized support from designated clinical staff supervised by the primary care provider, including individualized plan of care and coordination with other care providers 2. 24/7 contact phone numbers for assistance for urgent and routine care needs. 3. Service will only be billed when office clinical staff spend 20 minutes or more in a month to coordinate care. 4. Only one practitioner may furnish and bill the service in a calendar month. 5.The patient may stop CCM services at any time (effective at the end of the month) by phone call to the office staff. 6. The patient will be responsible for cost sharing (co-pay) of up to 20% of the service fee (after annual deductible is met). Patient agreed to services and consent obtained.  Patient agreed to services and consent obtained.   Assessment: Review of patient past medical history, allergies, medications, and health status, including review of relevant consultants reports was performed today as part of a comprehensive evaluation and provision of chronic care management and care coordination services.     SDOH (Social Determinants of Health) assessments and interventions performed:    Advanced Directives  Status: Not addressed in this encounter.  CCM Care Plan  Allergies  Allergen Reactions   Accupril [Quinapril Hcl]    Avelox [Moxifloxacin]    Ciprofloxacin    Hctz [Hydrochlorothiazide] Other (See Comments)    Leg swelling   Penicillins    Sulfa Antibiotics     Outpatient Encounter Medications as of 11/24/2021  Medication Sig   albuterol (VENTOLIN HFA) 108 (90 Base) MCG/ACT inhaler Inhale 2 puffs into the lungs every 6 (six) hours as needed for wheezing or shortness of breath.   amLODipine (NORVASC) 5 MG tablet Take 1 tablet (5 mg total) by mouth daily.   benazepril (LOTENSIN) 40 MG tablet Take 1 tablet (40 mg total) by mouth daily.   buPROPion (WELLBUTRIN XL) 150 MG 24 hr tablet Take 1 tablet (150 mg total) by mouth daily.   carvedilol (COREG) 12.5 MG tablet Take 1 tablet (12.5 mg total) by mouth 2 (two) times daily.   Fluticasone-Umeclidin-Vilant (TRELEGY ELLIPTA) 100-62.5-25 MCG/ACT AEPB Inhale 1 puff into the lungs daily.   gabapentin (NEURONTIN) 300 MG capsule Take 1 capsule (300 mg total) by mouth 3 (three) times daily.   hydrOXYzine (VISTARIL) 25 MG capsule Take 1 capsule (25 mg total) by mouth every 8 (eight) hours as needed.   megestrol (MEGACE) 40 MG tablet Take 1 tablet (40 mg total) by mouth 4 (four) times daily as needed.   montelukast (SINGULAIR) 10 MG tablet Take 1 tablet (10 mg total) by mouth at bedtime.   ondansetron (ZOFRAN ODT) 8 MG disintegrating tablet Take 1 tablet (8 mg total) by mouth every 8 (eight) hours as needed for nausea or vomiting.   pantoprazole (PROTONIX) 40 MG  tablet Take 1 tablet (40 mg total) by mouth at bedtime.   rosuvastatin (CRESTOR) 40 MG tablet Take 1 tablet (40 mg total) by mouth daily.   sertraline (ZOLOFT) 100 MG tablet Take 1.5 tablets (150 mg total) by mouth daily.   tiZANidine (ZANAFLEX) 4 MG tablet Take 1 tablet (4 mg total) by mouth every 6 (six) hours as needed for muscle spasms.   No facility-administered encounter medications on  file as of 11/24/2021.    Patient Active Problem List   Diagnosis Date Noted   Aortic atherosclerosis ([place]) 08/19/2020   Centrilobular emphysema ([place]) 08/20/2019   Anxiety 04/19/2018   Allergic rhinitis 09/28/2017   Advanced care planning/counseling discussion 04/14/2017   Vitamin D deficiency 02/07/2017   Osteoarthritis of hip (Bilateral) 11/29/2016   Chronic sacroiliac joint pain (Bilateral) 11/29/2016   Chronic pain syndrome 11/28/2016   Failed back surgical syndrome 11/28/2016   Chronic low back pain (Location of Primary Source of Pain) (Bilateral) (R>L) 11/28/2016   Chronic lower extremity pain (Bilateral) (R>L) 11/28/2016   Chronic neck pain (Location of Secondary source of pain) (Bilateral) (R>L) 11/28/2016   DDD (degenerative disc disease), lumbar 11/28/2016   Elevated TSH 07/08/2015   Acid reflux 11/19/2014   Depression, recurrent (HCC)    Hyperlipidemia    Hypertension    Postlaminectomy syndrome, lumbar region 11/17/2014    Conditions to be addressed/monitored: HTN, COPD, Anxiety, Depression, Osteoarthritis, and Chronic Pain Syndrome ; Limited social support, Limited access to food, and Mental Health Concerns   Care Plan : LCSW Plan of Care  Updates made by Rebekah Chesterfield, LCSW since 11/29/2021 12:00 AM     Problem: Coping Skills (General Plan of Care)      Long-Range Goal: Coping Skills Enhanced   Start Date: 11/24/2021  Expected End Date: 02/26/2022  This Visit's Progress: On track  Priority: High  Note:   Current Barriers:  Mental Health Concerns    CSW Clinical Goal(s):  Patient  will demonstrate a reduction in symptoms related to :Depression: anxiety  through collaboration with Clinical Social Worker, provider, and care team.   Interventions: Patient endorses an increase in stress within the last month due to psychosocial stressors LCSW assisted patient in identifying triggers to stressors. Healthy coping skills were discussed to assist with stress  management and grounding strategies. Enjoys taking care of two dogs Limited Support CCM LCSW reviewed upcoming appts 1:1 collaboration with primary care provider regarding development and update of comprehensive plan of care as evidenced by provider attestation and co-signature Inter-disciplinary care team collaboration (see longitudinal plan of care) Evaluation of current treatment plan related to  self management and patient's adherence to plan as established by provider Review resources, discussed options and provided patient information about  Care guide referral to assist with food insecurity (meal delivery) Mindfulness or Relaxation training provided Active listening / Reflection utilized  Emotional Support Provided Provided psychoeducation for mental health needs  Verbalization of feelings encouraged  Crisis Resource Education / information provided     Task & activities to accomplish goals: Contact PCP office with any questions or concerns Attend scheduled provider appts Utilize healthy coping skills and/or supportive resources discussed        Follow Up Plan: SW will follow up with patient by phone over the next 4-6 weeks      Christa See, MSW, [email or web address] Phone [phone] 7:59 AM

## 2021-11-29 NOTE — Telephone Encounter (Signed)
   Telephone encounter was:  Unsuccessful.  11/29/2021 Name: Caitlyn May MRN: [id number] DOB: 02/01/1956  Unsuccessful outbound call made today to assist with:  Food Insecurity  Outreach Attempt:  1st Attempt  A HIPAA compliant voice message was left requesting a return call.  Instructed patient to call back at [phone].  Cherelle Midkiff, AAS Paralegal, [facility]  [address], [place] [address] ??[email or web address]  ?? [phone]   [email or web address]
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
## 2021-11-29 NOTE — Patient Instructions (Signed)
Visit Information  Thank you for taking time to visit with me today. Please don't hesitate to contact me if I can be of assistance to you before our next scheduled telephone appointment.  Following are the goals we discussed today:  Task & activities to accomplish goals: Contact PCP office with any questions or concerns Attend scheduled provider appts Utilize healthy coping skills and/or supportive resources discussed  Our next appointment is by telephone on 12/29/21 at 1:15 PM  Please call the care guide team at [phone] if you need to cancel or reschedule your appointment.   If you are experiencing a Mental Health or [place] or need someone to talk to, please call the Suicide and Crisis Lifeline: 988 call 911   The patient verbalized understanding of instructions, educational materials, and care plan provided today and DECLINED offer to receive copy of patient instructions, educational materials, and care plan.   Christa See, MSW, [email or web address] Phone [phone] 8:00 AM

## 2021-11-29 NOTE — Assessment & Plan Note (Signed)
X 1 episode during period of poor air quality, suspect more related to this and performing more vigorous activity outside.  She did not pass out.  Recommend she keep Albuterol on hand at all times.  Continue to collaborate with cardiology as needed.  Referral to pulmonary placed.

## 2021-12-01 ENCOUNTER — Ambulatory Visit: Payer: Medicare HMO | Admitting: Nurse Practitioner

## 2021-12-07 ENCOUNTER — Telehealth: Payer: Self-pay

## 2021-12-07 NOTE — Telephone Encounter (Signed)
   Telephone encounter was:  Unsuccessful.  12/07/2021 Name: Caitlyn May MRN: [id number] DOB: 02/10/1956  Unsuccessful outbound call made today to assist with:  Food Insecurity  Outreach Attempt:  2nd Attempt  A HIPAA compliant voice message was left requesting a return call.  Instructed patient to call back at [phone].  Rafferty Postlewait, AAS Paralegal, [facility]  [address], [place] [address] ??[email or web address]  ?? [phone]   [email or web address]

## 2021-12-07 NOTE — Telephone Encounter (Signed)
   Telephone encounter was:  Successful.  12/07/2021 Name: Campbell Agramonte MRN: [id number] DOB: 27-Apr-1956  Chapel Silverthorn is a 66 y.o. year old female who is a primary care patient of Cannady, Barbaraann Faster, NP . The community resource team was consulted for assistance with [place] guide performed the following interventions: Spoke with patient, she is interested in meals on wheels and consented to [facility] referral.  Verified patient's home address will mail [facility] food pantry list.   Follow Up Plan:  Care guide will follow up with patient by phone over the next 7 days.  Curtistine Pettitt, AAS Paralegal, [facility]  [address], [place] [address] ??[email or web address]  ?? [phone]   [email or web address]

## 2021-12-14 ENCOUNTER — Telehealth: Payer: Self-pay

## 2021-12-14 NOTE — Telephone Encounter (Signed)
   Telephone encounter was:  Unsuccessful.  12/14/2021 Name: Caitlyn May MRN: [id number] DOB: 05-09-1956  Unsuccessful outbound call made today to assist with:  Food Insecurity  Outreach Attempt:  3rd Attempt.  Referral closed unable to contact patient.  A HIPAA compliant voice message was left requesting a return call.  Instructed patient to call back at [phone]. Left message on voicemail to inform patient that she has been placed on the meals on wheels waitlist.   Aisley Whan, AAS Paralegal, [facility]  [address], [place] [address] ??[email or web address]  ?? [phone]   [email or web address]

## 2021-12-15 ENCOUNTER — Telehealth: Payer: Medicare HMO

## 2021-12-15 ENCOUNTER — Ambulatory Visit (INDEPENDENT_AMBULATORY_CARE_PROVIDER_SITE_OTHER): Payer: Medicare HMO

## 2021-12-15 ENCOUNTER — Ambulatory Visit: Payer: Self-pay

## 2021-12-15 DIAGNOSIS — F419 Anxiety disorder, unspecified: Secondary | ICD-10-CM

## 2021-12-15 DIAGNOSIS — G8929 Other chronic pain: Secondary | ICD-10-CM

## 2021-12-15 DIAGNOSIS — J432 Centrilobular emphysema: Secondary | ICD-10-CM

## 2021-12-15 DIAGNOSIS — E782 Mixed hyperlipidemia: Secondary | ICD-10-CM

## 2021-12-15 DIAGNOSIS — F339 Major depressive disorder, recurrent, unspecified: Secondary | ICD-10-CM

## 2021-12-15 DIAGNOSIS — I10 Essential (primary) hypertension: Secondary | ICD-10-CM

## 2021-12-15 DIAGNOSIS — G894 Chronic pain syndrome: Secondary | ICD-10-CM

## 2021-12-15 DIAGNOSIS — J449 Chronic obstructive pulmonary disease, unspecified: Secondary | ICD-10-CM

## 2021-12-15 NOTE — Chronic Care Management (AMB) (Signed)
Chronic Care Management   CCM RN Visit Note  12/15/2021 Name: Caitlyn May MRN: [id number] DOB: 1955/07/20  Subjective: Caitlyn May is a 66 y.o. year old female who is a primary care patient of Cannady, Barbaraann Faster, NP. The care management team was consulted for assistance with disease management and care coordination needs.    Engaged with patient by telephone for follow up visit in response to provider referral for case management and/or care coordination services.   Consent to Services:  The patient was given information about Chronic Care Management services, agreed to services, and gave verbal consent prior to initiation of services.  Please see initial visit note for detailed documentation.   Patient agreed to services and verbal consent obtained.   Assessment: Review of patient past medical history, allergies, medications, health status, including review of consultants reports, laboratory and other test data, was performed as part of comprehensive evaluation and provision of chronic care management services.   SDOH (Social Determinants of Health) assessments and interventions performed:    CCM Care Plan  Allergies  Allergen Reactions   Accupril [Quinapril Hcl]    Avelox [Moxifloxacin]    Ciprofloxacin    Hctz [Hydrochlorothiazide] Other (See Comments)    Leg swelling   Penicillins    Sulfa Antibiotics     Outpatient Encounter Medications as of 12/15/2021  Medication Sig   albuterol (VENTOLIN HFA) 108 (90 Base) MCG/ACT inhaler Inhale 2 puffs into the lungs every 6 (six) hours as needed for wheezing or shortness of breath.   amLODipine (NORVASC) 5 MG tablet Take 1 tablet (5 mg total) by mouth daily.   buPROPion (WELLBUTRIN XL) 150 MG 24 hr tablet Take 1 tablet (150 mg total) by mouth daily.   carvedilol (COREG) 12.5 MG tablet Take 1 tablet (12.5 mg total) by mouth 2 (two) times daily.   Fluticasone-Umeclidin-Vilant (TRELEGY ELLIPTA) 100-62.5-25 MCG/ACT AEPB Inhale 1  puff into the lungs daily.   gabapentin (NEURONTIN) 300 MG capsule Take 1 capsule (300 mg total) by mouth 3 (three) times daily.   hydrOXYzine (VISTARIL) 25 MG capsule Take 1 capsule (25 mg total) by mouth every 8 (eight) hours as needed.   megestrol (MEGACE) 40 MG tablet Take 1 tablet (40 mg total) by mouth 4 (four) times daily as needed.   montelukast (SINGULAIR) 10 MG tablet Take 1 tablet (10 mg total) by mouth at bedtime.   olmesartan (BENICAR) 40 MG tablet Take 1 tablet (40 mg total) by mouth daily.   ondansetron (ZOFRAN ODT) 8 MG disintegrating tablet Take 1 tablet (8 mg total) by mouth every 8 (eight) hours as needed for nausea or vomiting.   pantoprazole (PROTONIX) 40 MG tablet Take 1 tablet (40 mg total) by mouth at bedtime.   rosuvastatin (CRESTOR) 40 MG tablet Take 1 tablet (40 mg total) by mouth daily.   sertraline (ZOLOFT) 100 MG tablet Take 1.5 tablets (150 mg total) by mouth daily.   tiZANidine (ZANAFLEX) 4 MG tablet Take 1 tablet (4 mg total) by mouth every 6 (six) hours as needed for muscle spasms.   No facility-administered encounter medications on file as of 12/15/2021.    Patient Active Problem List   Diagnosis Date Noted   Aortic atherosclerosis ([place]) 08/19/2020   Centrilobular emphysema ([place]) 08/20/2019   Anxiety 04/19/2018   Syncope 12/11/2017   Allergic rhinitis 09/28/2017   Advanced care planning/counseling discussion 04/14/2017   Vitamin D deficiency 02/07/2017   Osteoarthritis of hip (Bilateral) 11/29/2016   Chronic sacroiliac joint pain (Bilateral)  11/29/2016   Chronic pain syndrome 11/28/2016   Failed back surgical syndrome 11/28/2016   Chronic low back pain (Location of Primary Source of Pain) (Bilateral) (R>L) 11/28/2016   Chronic lower extremity pain (Bilateral) (R>L) 11/28/2016   Chronic neck pain (Location of Secondary source of pain) (Bilateral) (R>L) 11/28/2016   DDD (degenerative disc disease), lumbar 11/28/2016   Elevated TSH 07/08/2015   Acid  reflux 11/19/2014   Depression, recurrent (HCC)    Hyperlipidemia    Hypertension    Postlaminectomy syndrome, lumbar region 11/17/2014    Conditions to be addressed/monitored:HTN, HLD, COPD, Anxiety, Depression, and chronic pain   Care Plan : RNCM; General Plan of Care (Adult) for Chronic Disease Management and Care Coordination Needs  Updates made by Vanita Ingles, RN since 12/15/2021 12:00 AM     Problem: RNCM: Development of Plan of Care for Chronic Disease Management and Care Coordination Needs   Priority: High  Onset Date: 11/03/2021     Long-Range Goal: RNCM: Effective Management of Plan of Care for Chronic Disease Management (HTN, HLD, COPD, Anxiety, Depression, Falls, Smoker, Chronic Pain) Completed 12/15/2021  Start Date: 11/03/2021  Expected End Date: 11/04/2022  Priority: High  Note:   Current Barriers: 12-15-2021: Resolving, see new plan of care in [facility] goals Knowledge Deficits related to plan of care for management of HTN, HLD, COPD, Chronic Pain, and anxiety, depression, smoker, and falls  Care Coordination needs related to Mental Health Concerns   Chronic Disease Management support and education needs related to HTN, HLD, COPD, Chronic Pain, and anxiety, depression, smoker and falls prevention and safety Lacks caregiver support.        Difficulty obtaining medications Falls Recent respiratory event with a "panic attack" occurred on 10-28-2021  RNCM Clinical Goal(s):  Patient will verbalize basic understanding of HTN, HLD, COPD, Anxiety, Depression, Tobacco Use, and chronic pain and falls and safety concerns disease process and self health management plan as evidenced by keeping appointments, following the plan of care, calling the office for changes or questions, and working with the CCM team to optimize health and well being. take all medications exactly as prescribed and will call provider for medication related questions as evidenced by taking medications as prescribed and  calling for refills before running out of medications    attend all scheduled medical appointments: with pcp and specialist as evidenced by keeping appointments and calling for schedule change needs        demonstrate improved and ongoing adherence to prescribed treatment plan for HTN, HLD, COPD, Anxiety, Depression, Tobacco Use, and Chronic pain and smoker as evidenced by stable VS, routine lab work, following the plan of care and calling for changes in chronic conditions continue to work with Consulting civil engineer and/or Social Worker to address care management and care coordination needs related to HTN, HLD, COPD, Anxiety, Depression, Osteoarthritis, and smoker and falls and safety concerns as evidenced by adherence to CM Team Scheduled appointments     work with pharmacist to address Financial constraints related to paying for inhalers and Medication procurement related to COPD as evidenced by review of EMR and patient or pharmacist report    demonstrate ongoing self health care management ability for effective management of chronic conditions as evidenced by  working with the CCM team through collaboration with Consulting civil engineer, provider, and care team.   Interventions: 1:1 collaboration with primary care provider regarding development and update of comprehensive plan of care as evidenced by provider attestation and co-signature Inter-disciplinary  care team collaboration (see longitudinal plan of care) Evaluation of current treatment plan related to  self management and patient's adherence to plan as established by provider   COPD: (Status: Goal on Track (progressing): YES.) Long Term Goal  [id number]: Duplicate goal resolving- see new plan of care in [facility] Reviewed medications with patient, including use of prescribed maintenance and rescue inhalers, and provided instruction on medication management and the importance of adherence. 12-15-2021: The patient has an appointment with pharm D in July 24 to assist  with cost constraints related to Trelegy. The patient has a sample from the pcp office. Reminder given today for appointment with pharm D on 12-20-2021. Provided patient with basic written and verbal COPD education on self care/management/and exacerbation prevention Advised patient to track and manage COPD triggers. 11-03-2021: Reviewed with the patient possible triggers. She had an episode last week where she was moving limbs and her breathing was bad and she had a "panic attack" and urinated on self. She said nothing has ever happened like that before. Reflective listening and support given. 12-15-2021: The patient states that her breathing is her biggest issue right now and she does not know why she is having such a hard time. She is not going outside unless she has to do so. Education and support given.  Provided written and verbal instructions on pursed lip breathing and utilized returned demonstration as teach back Provided instruction about proper use of medications used for management of COPD including inhalers. 12-15-2021: The patient has a sample from the pcp office. She has an appointment on 12-20-2021 with the pharm D to assist with cost of inhalers. Reminder provided today. She also has an appointment in September with the pulmonary provider. She would like an earlier appointment.  Advised patient to self assesses COPD action plan zone and make appointment with provider if in the yellow zone for 48 hours without improvement. 12-15-2021: Education and support given.  Advised patient to engage in light exercise as tolerated 3-5 days a week to aid in the the management of COPD Provided education about and advised patient to utilize infection prevention strategies to reduce risk of respiratory infection. 12-15-2021: The patient states last week she had a "thick yellow discharge" from her nose and she has been coughing up this week clear mucus. The patient states it is clear and she denies it being green or  brown. Education on calling the office for sx and sx of infection. The patient verbalized understanding.  Discussed the importance of adequate rest and management of fatigue with COPD. 12-15-2021: Education given to pace activities and not over exert self. Screening for signs and symptoms of depression related to chronic disease state  Assessed social determinant of health barriers  Falls:  (Status: Goal Met.) Long Term Goal  12-15-2021: The patient denies any new falls or issues at this time.  Provided written and verbal education re: potential causes of falls and Fall prevention strategies Reviewed medications and discussed potential side effects of medications such as dizziness and frequent urination Advised patient of importance of notifying provider of falls Assessed for signs and symptoms of orthostatic hypotension Assessed for falls since last encounter. 11-03-2021: States last fall was on Christmas Day but has had some near misses. Education and support given.  Assessed patients knowledge of fall risk prevention secondary to previously provided education Provided patient information for fall alert systems Advised patient to discuss falls and safety prevention  with provider  Anxiety and Depression  (Status: Goal Met.)  Long Term Goal  12-15-2021: Patients depression and anxiety stable at this time Evaluation of current treatment plan related to Anxiety and Depression, Mental Health Concerns  self-management and patient's adherence to plan as established by provider. Discussed plans with patient for ongoing care management follow up and provided patient with direct contact information for care management team Advised patient to write down question and concerns to ask the provider at today's visit. She believes she had a panic attack last Thursday and has been fearful to leave her house since; Provided education to patient re: mindfulness activities, doing hobbies she enjoys, socialization with  friends, working through times when she is depressed and anxious and calling the office for new episodes of anxiety or depression; Reviewed medications with patient and discussed compliance she takes Wellbutrin and Zoloft and these help with her depression; Collaborated with office staff regarding the patient feels she needs to come in and be seen due to her anxiety and stress level. Has an appointment to see Dr. Neomia Dear at 140 pm; Provided patient with working through anxiety and depression, and resources available to help with anxiety and depression  educational materials related to chronic anxiety and depression; Reviewed scheduled/upcoming provider appointments including 11-03-2021 at 140 pm with Dr. Neomia Dear; Social Work referral for depression and anxiety resources and support. Is open to talking to the LCSW and has an appointment with LCSW on 11-24-2021 at 115 pm; Discussed plans with patient for ongoing care management follow up and provided patient with direct contact information for care management team; Advised patient to discuss her new concerns and the events that happened last week when she felt she had a "panic attack" with provider; Screening for signs and symptoms of depression related to chronic disease state;  Assessed social determinant of health barriers;   Hyperlipidemia:  (Status: Goal Met.) Long Term Goal  12-15-2021: Goals met and care plan is being closed. The patient states she has an upcoming appointment with pcp for lab work and evaluation of cholesterol medication. Was changes to Crestor 40 mg back in May. The patient states she cannot tell a difference in leg cramps since switching to Crestor. Recommended that she talk to pcp about this at the visit on 12-27-2021 Lab Results  Component Value Date   CHOL 154 05/07/2021   HDL 44 05/07/2021   LDLCALC 86 05/07/2021   TRIG 133 05/07/2021   CHOLHDL 2.3 12/15/2020     Medication review performed; medication list updated in electronic  medical record. 11-03-2021 the patient takes Crestor 40 mg QD Provider established cholesterol goals reviewed. 11-03-2021: The patient is at goal; Counseled on importance of regular laboratory monitoring as prescribed; Provided HLD educational materials. Putting information in AVS for office to print off and give to the patient; Reviewed role and benefits of statin for ASCVD risk reduction; Discussed strategies to manage statin-induced myalgias; Reviewed importance of limiting foods high in cholesterol; Reviewed exercise goals and target of 150 minutes per week;  Hypertension: (Status: Goal Met.) Long Term Goal  12-15-2021 :Goals met and care plan is being closed  Last practice recorded BP readings:  BP Readings from Last 3 Encounters:  11/29/21 128/80  11/03/21 (!) 147/97  10/20/21 105/71  Most recent eGFR/CrCl:  Lab Results  Component Value Date   EGFR 70 06/30/2021    No components found for: CRCL  Evaluation of current treatment plan related to hypertension self management and patient's adherence to plan as established by provider;   Provided education to patient re:  stroke prevention, s/s of heart attack and stroke; Reviewed prescribed diet 11-03-2021: Heart healthy diet Reviewed medications with patient and discussed importance of compliance;  Discussed plans with patient for ongoing care management follow up and provided patient with direct contact information for care management team; Advised patient, providing education and rationale, to monitor blood pressure daily and record, calling PCP for findings outside established parameters;  Reviewed scheduled/upcoming provider appointments including:  Advised patient to discuss HTN and blood pressures trends with provider; Provided education on prescribed diet heart healthy;  Discussed complications of poorly controlled blood pressure such as heart disease, stroke, circulatory complications, vision complications, kidney impairment, sexual  dysfunction;   Pain:  (Status: Goal Met.) Long Term Goal  12-15-2021: The patient rates her generalized pain at a 6 today on a scale of 0-10. States it is mainly in her back and shoulders. She takes muscle relaxer's and Tylenol. Ask the pcp about referral for pain management but the pcp is concerned due to the patients breathing.   Pain assessment performed. 11-03-2021: The patient has generalized pain in back, arms, neck, down right leg. Rates her pain level at a 6 today Medications reviewed. 11-03-2021: Is compliant with medications Reviewed provider established plan for pain management. 11-03-2021: The patient has see [facility] and they want her to have a MRI. The insurance will pay for it but [facility] states she has to pay 160.00 and she is not pleased with this since her insurance told her it was a covered expense. Encouraged the patient to call her insurance and inquire about other places she can go; Discussed importance of adherence to all scheduled medical appointments. 11-03-2021: An appointment with the pcp was secured for today. Counseled on the importance of reporting any/all new or changed pain symptoms or management strategies to pain management provider; Advised patient to report to care team affect of pain on daily activities; Discussed use of relaxation techniques and/or diversional activities to assist with pain reduction (distraction, imagery, relaxation, massage, acupressure, TENS, heat, and cold application; Reviewed with patient prescribed pharmacological and nonpharmacological pain relief strategies; Advised patient to discuss unresolved pain, changes in level and intensity of pain and other pain management options as [facility] is not working for her with provider;   Smoking Cessation: (Status: Condition stable. Not addressed this visit.) Long Term Goal  12-15-2021: Goals met and care plan is being closed  Reviewed smoking history:  tobacco abuse of 53 years; currently smoking 1  ppd (In past smoked 2 ppd) Previous quit attempts, unsuccessful- has never tried to quit, successful using has never tried to quit  Reports smoking within 30 minutes of waking up Reports triggers to smoke include: dog that is 13 years old not doing good and "my nerves are shot", own health conditions, and stressors in her life Reports motivation to quit smoking includes: none at this time On a scale of 1-10, reports MOTIVATION to quit is 0 On a scale of 1-10, reports CONFIDENCE in quitting is 0  Evaluation of current treatment plan reviewed; Advised patient to discuss smoking cessation options with provider; Provided contact information for [place] [facility] (1-800-QUIT-NOW); Reviewed smoking cessation techniques: removing cigarettes and smoking materials from environment, stress management, substitution of other forms of reinforcement, support of family/friends, and written materials Discussed plans with patient for ongoing care management follow up and provided patient with direct contact information for care management team; Advised patient to discuss options for smoking cessation with provider when she is ready to  quit smoking.  Patient Goals/Self-Care Activities: Take medications as prescribed   Attend all scheduled provider appointments Call pharmacy for medication refills 3-7 days in advance of running out of medications Attend church or other social activities Perform all self care activities independently  Perform IADL's (shopping, preparing meals, housekeeping, managing finances) independently Call provider office for new concerns or questions  Work with the social worker to address care coordination needs and will continue to work with the clinical team to address health care and disease management related needs call the Suicide and Crisis Lifeline: 988 call the [place] National Suicide Prevention Lifeline: 984-171-6956 or TTY: (605) 043-3340 TTY 878-272-2168) to talk to a trained  counselor call 1-800-273-TALK (toll free, 24 hour hotline) if experiencing a Mental Health or [place]  avoid second hand smoke eliminate smoking in my home identify and avoid work-related triggers identify and remove indoor air pollutants limit outdoor activity during cold weather listen for public air quality announcements every day do breathing exercises every day begin a symptom diary develop a rescue plan eliminate symptom triggers at home follow rescue plan if symptoms flare-up keep follow-up appointments: see specialist and pcp on a regular basis use an extra pillow to sleep develop a new routine to improve sleep don't eat or exercise right before bedtime eat healthy/prescribed diet: heart healthy get at least 7 to 8 hours of sleep at night use devices that will help like a cane, sock-puller or reacher practice relaxation or meditation daily do exercises in a comfortable position that makes breathing as easy as possible check blood pressure weekly choose a place to take my blood pressure (home, clinic or office, retail store) write blood pressure results in a log or diary learn about high blood pressure keep a blood pressure log take blood pressure log to all doctor appointments call doctor for signs and symptoms of high blood pressure develop an action plan for high blood pressure keep all doctor appointments take medications for blood pressure exactly as prescribed report new symptoms to your doctor eat more whole grains, fruits and vegetables, lean meats and healthy fats - call for medicine refill 2 or 3 days before it runs out - take all medications exactly as prescribed - call doctor with any symptoms you believe are related to your medicine - call doctor when you experience any new symptoms - go to all doctor appointments as scheduled - adhere to prescribed diet: Heart Healthy       Plan:Telephone follow up appointment with care management team  member scheduled for:  02-02-2022 at 230 pm  [place], MSN, [facility] Mobile: [phone]

## 2021-12-15 NOTE — Patient Instructions (Signed)
Visit Information  Thank you for taking time to visit with me today. Please don't hesitate to contact me if I can be of assistance to you before our next scheduled telephone appointment.  Following are the goals we discussed today:  Current Barriers: 12-15-2021: Resolving, see new plan of care in [facility] goals Knowledge Deficits related to plan of care for management of HTN, HLD, COPD, Chronic Pain, and anxiety, depression, smoker, and falls  Care Coordination needs related to Mental Health Concerns   Chronic Disease Management support and education needs related to HTN, HLD, COPD, Chronic Pain, and anxiety, depression, smoker and falls prevention and safety Lacks caregiver support.        Difficulty obtaining medications Falls Recent respiratory event with a "panic attack" occurred on 10-28-2021   RNCM Clinical Goal(s):  Patient will verbalize basic understanding of HTN, HLD, COPD, Anxiety, Depression, Tobacco Use, and chronic pain and falls and safety concerns disease process and self health management plan as evidenced by keeping appointments, following the plan of care, calling the office for changes or questions, and working with the CCM team to optimize health and well being. take all medications exactly as prescribed and will call provider for medication related questions as evidenced by taking medications as prescribed and calling for refills before running out of medications    attend all scheduled medical appointments: with pcp and specialist as evidenced by keeping appointments and calling for schedule change needs        demonstrate improved and ongoing adherence to prescribed treatment plan for HTN, HLD, COPD, Anxiety, Depression, Tobacco Use, and Chronic pain and smoker as evidenced by stable VS, routine lab work, following the plan of care and calling for changes in chronic conditions continue to work with Consulting civil engineer and/or Social Worker to address care management and care  coordination needs related to HTN, HLD, COPD, Anxiety, Depression, Osteoarthritis, and smoker and falls and safety concerns as evidenced by adherence to CM Team Scheduled appointments     work with pharmacist to address Financial constraints related to paying for inhalers and Medication procurement related to COPD as evidenced by review of EMR and patient or pharmacist report    demonstrate ongoing self health care management ability for effective management of chronic conditions as evidenced by  working with the CCM team through collaboration with Consulting civil engineer, provider, and care team.    Interventions: 1:1 collaboration with primary care provider regarding development and update of comprehensive plan of care as evidenced by provider attestation and co-signature Inter-disciplinary care team collaboration (see longitudinal plan of care) Evaluation of current treatment plan related to  self management and patient's adherence to plan as established by provider     COPD: (Status: Goal on Track (progressing): YES.) Long Term Goal  [id number]: Duplicate goal resolving- see new plan of care in [facility] Reviewed medications with patient, including use of prescribed maintenance and rescue inhalers, and provided instruction on medication management and the importance of adherence. 12-15-2021: The patient has an appointment with pharm D in July 24 to assist with cost constraints related to Trelegy. The patient has a sample from the pcp office. Reminder given today for appointment with pharm D on 12-20-2021. Provided patient with basic written and verbal COPD education on self care/management/and exacerbation prevention Advised patient to track and manage COPD triggers. 11-03-2021: Reviewed with the patient possible triggers. She had an episode last week where she was moving limbs and her breathing was bad and she had a "  panic attack" and urinated on self. She said nothing has ever happened like that before. Reflective  listening and support given. 12-15-2021: The patient states that her breathing is her biggest issue right now and she does not know why she is having such a hard time. She is not going outside unless she has to do so. Education and support given.  Provided written and verbal instructions on pursed lip breathing and utilized returned demonstration as teach back Provided instruction about proper use of medications used for management of COPD including inhalers. 12-15-2021: The patient has a sample from the pcp office. She has an appointment on 12-20-2021 with the pharm D to assist with cost of inhalers. Reminder provided today. She also has an appointment in September with the pulmonary provider. She would like an earlier appointment.  Advised patient to self assesses COPD action plan zone and make appointment with provider if in the yellow zone for 48 hours without improvement. 12-15-2021: Education and support given.  Advised patient to engage in light exercise as tolerated 3-5 days a week to aid in the the management of COPD Provided education about and advised patient to utilize infection prevention strategies to reduce risk of respiratory infection. 12-15-2021: The patient states last week she had a "thick yellow discharge" from her nose and she has been coughing up this week clear mucus. The patient states it is clear and she denies it being green or brown. Education on calling the office for sx and sx of infection. The patient verbalized understanding.  Discussed the importance of adequate rest and management of fatigue with COPD. 12-15-2021: Education given to pace activities and not over exert self. Screening for signs and symptoms of depression related to chronic disease state  Assessed social determinant of health barriers   Falls:  (Status: Goal Met.) Long Term Goal  12-15-2021: The patient denies any new falls or issues at this time.  Provided written and verbal education re: potential causes of falls  and Fall prevention strategies Reviewed medications and discussed potential side effects of medications such as dizziness and frequent urination Advised patient of importance of notifying provider of falls Assessed for signs and symptoms of orthostatic hypotension Assessed for falls since last encounter. 11-03-2021: States last fall was on Christmas Day but has had some near misses. Education and support given.  Assessed patients knowledge of fall risk prevention secondary to previously provided education Provided patient information for fall alert systems Advised patient to discuss falls and safety prevention  with provider   Anxiety and Depression  (Status: Goal Met.) Long Term Goal  12-15-2021: Patients depression and anxiety stable at this time Evaluation of current treatment plan related to Anxiety and Depression, Mental Health Concerns  self-management and patient's adherence to plan as established by provider. Discussed plans with patient for ongoing care management follow up and provided patient with direct contact information for care management team Advised patient to write down question and concerns to ask the provider at today's visit. She believes she had a panic attack last Thursday and has been fearful to leave her house since; Provided education to patient re: mindfulness activities, doing hobbies she enjoys, socialization with friends, working through times when she is depressed and anxious and calling the office for new episodes of anxiety or depression; Reviewed medications with patient and discussed compliance she takes Wellbutrin and Zoloft and these help with her depression; Collaborated with office staff regarding the patient feels she needs to come in and be seen due  to her anxiety and stress level. Has an appointment to see Dr. Neomia Dear at 140 pm; Provided patient with working through anxiety and depression, and resources available to help with anxiety and depression  educational  materials related to chronic anxiety and depression; Reviewed scheduled/upcoming provider appointments including 11-03-2021 at 140 pm with Dr. Neomia Dear; Social Work referral for depression and anxiety resources and support. Is open to talking to the LCSW and has an appointment with LCSW on 11-24-2021 at 115 pm; Discussed plans with patient for ongoing care management follow up and provided patient with direct contact information for care management team; Advised patient to discuss her new concerns and the events that happened last week when she felt she had a "panic attack" with provider; Screening for signs and symptoms of depression related to chronic disease state;  Assessed social determinant of health barriers;    Hyperlipidemia:  (Status: Goal Met.) Long Term Goal  12-15-2021: Goals met and care plan is being closed. The patient states she has an upcoming appointment with pcp for lab work and evaluation of cholesterol medication. Was changes to Crestor 40 mg back in May. The patient states she cannot tell a difference in leg cramps since switching to Crestor. Recommended that she talk to pcp about this at the visit on 12-27-2021      Lab Results  Component Value Date    CHOL 154 05/07/2021    HDL 44 05/07/2021    LDLCALC 86 05/07/2021    TRIG 133 05/07/2021    CHOLHDL 2.3 12/15/2020      Medication review performed; medication list updated in electronic medical record. 11-03-2021 the patient takes Crestor 40 mg QD Provider established cholesterol goals reviewed. 11-03-2021: The patient is at goal; Counseled on importance of regular laboratory monitoring as prescribed; Provided HLD educational materials. Putting information in AVS for office to print off and give to the patient; Reviewed role and benefits of statin for ASCVD risk reduction; Discussed strategies to manage statin-induced myalgias; Reviewed importance of limiting foods high in cholesterol; Reviewed exercise goals and target of 150  minutes per week;   Hypertension: (Status: Goal Met.) Long Term Goal  12-15-2021 :Goals met and care plan is being closed  Last practice recorded BP readings:     BP Readings from Last 3 Encounters:  11/29/21 128/80  11/03/21 (!) 147/97  10/20/21 105/71  Most recent eGFR/CrCl:       Lab Results  Component Value Date    EGFR 70 06/30/2021    No components found for: CRCL   Evaluation of current treatment plan related to hypertension self management and patient's adherence to plan as established by provider;   Provided education to patient re: stroke prevention, s/s of heart attack and stroke; Reviewed prescribed diet 11-03-2021: Heart healthy diet Reviewed medications with patient and discussed importance of compliance;  Discussed plans with patient for ongoing care management follow up and provided patient with direct contact information for care management team; Advised patient, providing education and rationale, to monitor blood pressure daily and record, calling PCP for findings outside established parameters;  Reviewed scheduled/upcoming provider appointments including:  Advised patient to discuss HTN and blood pressures trends with provider; Provided education on prescribed diet heart healthy;  Discussed complications of poorly controlled blood pressure such as heart disease, stroke, circulatory complications, vision complications, kidney impairment, sexual dysfunction;    Pain:  (Status: Goal Met.) Long Term Goal  12-15-2021: The patient rates her generalized pain at a 6 today on a  scale of 0-10. States it is mainly in her back and shoulders. She takes muscle relaxer's and Tylenol. Ask the pcp about referral for pain management but the pcp is concerned due to the patients breathing.   Pain assessment performed. 11-03-2021: The patient has generalized pain in back, arms, neck, down right leg. Rates her pain level at a 6 today Medications reviewed. 11-03-2021: Is compliant with  medications Reviewed provider established plan for pain management. 11-03-2021: The patient has see [facility] and they want her to have a MRI. The insurance will pay for it but [facility] states she has to pay 160.00 and she is not pleased with this since her insurance told her it was a covered expense. Encouraged the patient to call her insurance and inquire about other places she can go; Discussed importance of adherence to all scheduled medical appointments. 11-03-2021: An appointment with the pcp was secured for today. Counseled on the importance of reporting any/all new or changed pain symptoms or management strategies to pain management provider; Advised patient to report to care team affect of pain on daily activities; Discussed use of relaxation techniques and/or diversional activities to assist with pain reduction (distraction, imagery, relaxation, massage, acupressure, TENS, heat, and cold application; Reviewed with patient prescribed pharmacological and nonpharmacological pain relief strategies; Advised patient to discuss unresolved pain, changes in level and intensity of pain and other pain management options as [facility] is not working for her with provider;     Smoking Cessation: (Status: Condition stable. Not addressed this visit.) Long Term Goal  12-15-2021: Goals met and care plan is being closed  Reviewed smoking history:  tobacco abuse of 53 years; currently smoking 1 ppd (In past smoked 2 ppd) Previous quit attempts, unsuccessful- has never tried to quit, successful using has never tried to quit  Reports smoking within 30 minutes of waking up Reports triggers to smoke include: dog that is 88 years old not doing good and "my nerves are shot", own health conditions, and stressors in her life Reports motivation to quit smoking includes: none at this time On a scale of 1-10, reports MOTIVATION to quit is 0 On a scale of 1-10, reports CONFIDENCE in quitting is 0   Evaluation of  current treatment plan reviewed; Advised patient to discuss smoking cessation options with provider; Provided contact information for [place] [facility]uit [facility] (1-800-QUIT-NOW); Reviewed smoking cessation techniques: removing cigarettes and smoking materials from environment, stress management, substitution of other forms of reinforcement, support of family/friends, and written materials Discussed plans with patient for ongoing care management follow up and provided patient with direct contact information for care management team; Advised patient to discuss options for smoking cessation with provider when she is ready to quit smoking.   Patient Goals/Self-Care Activities: Take medications as prescribed   Attend all scheduled provider appointments Call pharmacy for medication refills 3-7 days in advance of running out of medications Attend church or other social activities Perform all self care activities independently  Perform IADL's (shopping, preparing meals, housekeeping, managing finances) independently Call provider office for new concerns or questions  Work with the social worker to address care coordination needs and will continue to work with the clinical team to address health care and disease management related needs call the Suicide and Crisis Lifeline: 988 call the [place] National Suicide Prevention Lifeline: 904-416-3155 or TTY: (906)356-7499 TTY (863)518-9356) to talk to a trained counselor call 1-800-273-TALK (toll free, 24 hour hotline) if experiencing a Mental Health or [place]  avoid second hand smoke eliminate smoking in my home identify and avoid work-related triggers identify and remove indoor air pollutants limit outdoor activity during cold weather listen for public air quality announcements every day do breathing exercises every day begin a symptom diary develop a rescue plan eliminate symptom triggers at home follow rescue plan if symptoms flare-up keep  follow-up appointments: see specialist and pcp on a regular basis use an extra pillow to sleep develop a new routine to improve sleep don't eat or exercise right before bedtime eat healthy/prescribed diet: heart healthy get at least 7 to 8 hours of sleep at night use devices that will help like a cane, sock-puller or reacher practice relaxation or meditation daily do exercises in a comfortable position that makes breathing as easy as possible check blood pressure weekly choose a place to take my blood pressure (home, clinic or office, retail store) write blood pressure results in a log or diary learn about high blood pressure keep a blood pressure log take blood pressure log to all doctor appointments call doctor for signs and symptoms of high blood pressure develop an action plan for high blood pressure keep all doctor appointments take medications for blood pressure exactly as prescribed report new symptoms to your doctor eat more whole grains, fruits and vegetables, lean meats and healthy fats - call for medicine refill 2 or 3 days before it runs out - take all medications exactly as prescribed - call doctor with any symptoms you believe are related to your medicine - call doctor when you experience any new symptoms - go to all doctor appointments as scheduled - adhere to prescribed diet: Heart Healthy          Plan:Telephone follow up appointment with care management team member scheduled for:  02-02-2022 at 230 pm   [place], MSN, [facility] Mobile: [phone]                            Our next appointment is by telephone on 02-02-2022 at 230 pm  Please call the care guide team at [phone] if you need to cancel or reschedule your appointment.   If you are experiencing a Mental Health or [place] or need someone to talk to, please call the Suicide and  Crisis Lifeline: 988 call the [place] National Suicide Prevention Lifeline: (907) 211-8421 or TTY: 3433391278 TTY 239-161-3455) to talk to a trained counselor call 1-800-273-TALK (toll free, 24 hour hotline)   The patient verbalized understanding of instructions, educational materials, and care plan provided today and DECLINED offer to receive copy of patient instructions, educational materials, and care plan.   Noreene Larsson RN, MSN, [facility] Mobile: [phone]

## 2021-12-15 NOTE — Patient Instructions (Signed)
Visit Information  Thank you for taking time to visit with me today. Please don't hesitate to contact me if I can be of assistance to you before our next scheduled telephone appointment.  Following are the goals we discussed today:  Goals Addressed                       This Visit's Progress     RNCM: Effective Management of COPD            Care Coordination Interventions: Provided patient with basic written and verbal COPD education on self care/management/and exacerbation prevention. 12-15-2021: Review and education provided to the patient on monitoring for changes in her breathing and pacing activity.  Advised patient to track and manage COPD triggers. 12-15-2021: Review of factors that can cause exacerbation pf COPD. She is not going outside due to the heat right now unless she absolutely has to do so.  Provided written and verbal instructions on pursed lip breathing and utilized returned demonstration as teach back Provided instruction about proper use of medications used for management of COPD including inhalers. 12-15-2021: Has an appointment next week with pharm D for assistance with inhaler cost.  Advised patient to self assesses COPD action plan zone and make appointment with provider if in the yellow zone for 48 hours without improvement. 12-15-2021: Review and education provided. Advised patient to engage in light exercise as tolerated 3-5 days a week to aid in the the management of COPD Provided education about and advised patient to utilize infection prevention strategies to reduce risk of respiratory infection. 12-15-2021: The patient with a productive cough noted. The patient states it is clear. Last week did have "thick yellow drainage" from her nose. Education on sx and sx of infection and to call the pcp for changes. Discussed the importance of adequate rest and management of fatigue with COPD Screening for signs and symptoms of depression related to chronic disease state   Assessed social determinant of health barriers Reminder given of upcoming appointments today            Our next appointment is by telephone on 02-02-2022 at 230 pm  Please call the care guide team at [phone] if you need to cancel or reschedule your appointment.   If you are experiencing a Mental Health or [place] or need someone to talk to, please call the Suicide and Crisis Lifeline: 988 call the [place] National Suicide Prevention Lifeline: (782)561-3520 or TTY: 5103818148 TTY 301-460-5057) to talk to a trained counselor call 1-800-273-TALK (toll free, 24 hour hotline)   The patient verbalized understanding of instructions, educational materials, and care plan provided today and DECLINED offer to receive copy of patient instructions, educational materials, and care plan.   Telephone follow up appointment with care management team member scheduled for: 02-02-2022 at 230 pm  [place], MSN, [facility] Mobile: [phone]

## 2021-12-15 NOTE — Patient Outreach (Signed)
  Care Coordination   Follow Up Visit Note   12/15/2021 Name: Caitlyn May MRN: [id number] DOB: 06-11-1955  Caitlyn May is a 66 y.o. year old female who sees [place], Henrine Screws T, NP for primary care. I spoke with  [name] by phone today  What matters to the patients health and wellness today?  Effective management of her COPD. Has been having harder time recently with her breathing being worse   Goals Addressed             This Visit's Progress    RNCM: Effective Management of COPD       Care Coordination Interventions: Provided patient with basic written and verbal COPD education on self care/management/and exacerbation prevention. 12-15-2021: Review and education provided to the patient on monitoring for changes in her breathing and pacing activity.  Advised patient to track and manage COPD triggers. 12-15-2021: Review of factors that can cause exacerbation pf COPD. She is not going outside due to the heat right now unless she absolutely has to do so.  Provided written and verbal instructions on pursed lip breathing and utilized returned demonstration as teach back Provided instruction about proper use of medications used for management of COPD including inhalers. 12-15-2021: Has an appointment next week with pharm D for assistance with inhaler cost.  Advised patient to self assesses COPD action plan zone and make appointment with provider if in the yellow zone for 48 hours without improvement. 12-15-2021: Review and education provided. Advised patient to engage in light exercise as tolerated 3-5 days a week to aid in the the management of COPD Provided education about and advised patient to utilize infection prevention strategies to reduce risk of respiratory infection. 12-15-2021: The patient with a productive cough noted. The patient states it is clear. Last week did have "thick yellow drainage" from her nose. Education on sx and sx of infection and to call the pcp for  changes. Discussed the importance of adequate rest and management of fatigue with COPD Screening for signs and symptoms of depression related to chronic disease state  Assessed social determinant of health barriers Reminder given of upcoming appointments today           SDOH assessments and interventions completed:   Yes SDOH Interventions Today    Flowsheet Row Most Recent Value  SDOH Interventions   Food Insecurity Interventions [id number] Referral, Other (Comment)  [reviewed with the patient today the [place] referral and the information from care guide that she is on the waitlist for [facility]]       Care Coordination Interventions Activated:  Yes Care Coordination Interventions:   Yes, provided  Follow up plan: Follow up call scheduled for 02-02-2022 at 230 pm  Encounter Outcome:  Pt. Visit Completed  Noreene Larsson RN, MSN, [facility] Mobile: [phone]

## 2021-12-17 ENCOUNTER — Telehealth: Payer: Self-pay

## 2021-12-17 NOTE — Chronic Care Management (AMB) (Signed)
Chronic Care Management Pharmacy Assistant   Name: Caitlyn May  MRN: [id number] DOB: 15-Jun-1955  Caitlyn May is an 66 y.o. year old female who presents for his initial CCM visit with the clinical pharmacist.   Recent office visits:  11/29/21-Caitlyn T. Cannady, NP (PCP) Seen for a fall. stop Benazepril and change to Olmesartan 40 mg. Ambulatory referral to Pulmonology. Follow up in 4 weeks. 11/03/21-Caitlyn Vigg, MD (Video visit) Seen for panic attack. 10/20/21-Caitlyn T. Ned Card, NP (PCP) Seen for medication management.Will change to Tizanidine and stop Flexeril. Follow up in 6 weeks. 06/28/21-Caitlyn T. Ned Card, NP (PCP) Seen for abdominal pain. Labs ordered. Follow up in 3 days.  Recent consult visits:  07/02/21-Caitlyn Kayleen Memos, MD [facility]) Notes not available. 06/22/21-Caitlyn May (Cardiology) Notes not available.   Hospital visits:  None in previous 6 months  Medications: Outpatient Encounter Medications as of 12/17/2021  Medication Sig   albuterol (VENTOLIN HFA) 108 (90 Base) MCG/ACT inhaler Inhale 2 puffs into the lungs every 6 (six) hours as needed for wheezing or shortness of breath.   amLODipine (NORVASC) 5 MG tablet Take 1 tablet (5 mg total) by mouth daily.   buPROPion (WELLBUTRIN XL) 150 MG 24 hr tablet Take 1 tablet (150 mg total) by mouth daily.   carvedilol (COREG) 12.5 MG tablet Take 1 tablet (12.5 mg total) by mouth 2 (two) times daily.   Fluticasone-Umeclidin-Vilant (TRELEGY ELLIPTA) 100-62.5-25 MCG/ACT AEPB Inhale 1 puff into the lungs daily.   gabapentin (NEURONTIN) 300 MG capsule Take 1 capsule (300 mg total) by mouth 3 (three) times daily.   hydrOXYzine (VISTARIL) 25 MG capsule Take 1 capsule (25 mg total) by mouth every 8 (eight) hours as needed.   megestrol (MEGACE) 40 MG tablet Take 1 tablet (40 mg total) by mouth 4 (four) times daily as needed.   montelukast (SINGULAIR) 10 MG tablet Take 1 tablet (10 mg total) by mouth at bedtime.    olmesartan (BENICAR) 40 MG tablet Take 1 tablet (40 mg total) by mouth daily.   ondansetron (ZOFRAN ODT) 8 MG disintegrating tablet Take 1 tablet (8 mg total) by mouth every 8 (eight) hours as needed for nausea or vomiting.   pantoprazole (PROTONIX) 40 MG tablet Take 1 tablet (40 mg total) by mouth at bedtime.   rosuvastatin (CRESTOR) 40 MG tablet Take 1 tablet (40 mg total) by mouth daily.   sertraline (ZOLOFT) 100 MG tablet Take 1.5 tablets (150 mg total) by mouth daily.   tiZANidine (ZANAFLEX) 4 MG tablet Take 1 tablet (4 mg total) by mouth every 6 (six) hours as needed for muscle spasms.   No facility-administered encounter medications on file as of 12/17/2021.   Albuterol (VENTOLIN HFA) 108 (90 Base) MCG/ACT inhaler Last filled:06/047/23 25 DS AmLODipine (NORVASC) 5 MG tablet Last filled:10/06/21 90 DS BuPROPion (WELLBUTRIN XL) 150 MG 24 hr tablet Last filled:11/11/21 90 DS Carvedilol (COREG) 12.5 MG tablet Last filled:12/06/21 90 DS Fluticasone-Umeclidin-Vilant (TRELEGY ELLIPTA) 100-62.5-25 MCG/ACT AEPB Last filled:06/17/21 30 DS Gabapentin (NEURONTIN) 300 MG capsule Last filled:10/20/21 90 DS HydrOXYzine (VISTARIL) 25 MG capsule Last filled:11/03/21 6 DS Megestrol (MEGACE) 40 MG tablet Last filled:10/21/21 90 DS Montelukast (SINGULAIR) 10 MG tablet Last filled:09/14/21 90 DS Olmesartan (BENICAR) 40 MG tablet Last filled:12/02/21 90 DS Ondansetron (ZOFRAN ODT) 8 MG disintegrating tablet Last filled:10/14/19 10 DS Pantoprazole (PROTONIX) 40 MG tablet Last filled:12/09/21 90 DS Rosuvastatin (CRESTOR) 40 MG tablet Last filled:10/20/21 90 DS Sertraline (ZOLOFT) 100 MG tablet Last filled:10/20/21 90 DS   Care Gaps: Zoster  Vaccines:Never done TETANUS/TDAP:Last completed: Nov 01, 2007  Star Rating Drugs: Olmesartan 40 mg Last filled:10/20/21 90 DS Rosuvastatin 40 mg Last filled:10/20/21 90 DS Carvedilol 12.5 mg Last filled:12/06/21 90 DS  Caitlyn May, [place]

## 2021-12-20 ENCOUNTER — Ambulatory Visit: Payer: Medicare HMO

## 2021-12-20 DIAGNOSIS — J449 Chronic obstructive pulmonary disease, unspecified: Secondary | ICD-10-CM

## 2021-12-20 DIAGNOSIS — I10 Essential (primary) hypertension: Secondary | ICD-10-CM

## 2021-12-20 DIAGNOSIS — F419 Anxiety disorder, unspecified: Secondary | ICD-10-CM

## 2021-12-20 NOTE — Patient Instructions (Signed)
Visit Information   Goals Addressed   None    Patient Care Plan: RNCM; General Plan of Care (Adult) for Chronic Disease Management and Care Coordination Needs     Problem Identified: RNCM: Development of Plan of Care for Chronic Disease Management and Care Coordination Needs   Priority: High  Onset Date: 11/03/2021     Long-Range Goal: RNCM: Effective Management of Plan of Care for Chronic Disease Management (HTN, HLD, COPD, Anxiety, Depression, Falls, Smoker, Chronic Pain) Completed 12/15/2021  Start Date: 11/03/2021  Expected End Date: 11/04/2022  Priority: High  Note:   Current Barriers: 12-15-2021: Resolving, see new plan of care in [facility] goals Knowledge Deficits related to plan of care for management of HTN, HLD, COPD, Chronic Pain, and anxiety, depression, smoker, and falls  Care Coordination needs related to Mental Health Concerns   Chronic Disease Management support and education needs related to HTN, HLD, COPD, Chronic Pain, and anxiety, depression, smoker and falls prevention and safety Lacks caregiver support.        Difficulty obtaining medications Falls Recent respiratory event with a "panic attack" occurred on 10-28-2021  RNCM Clinical Goal(s):  Patient will verbalize basic understanding of HTN, HLD, COPD, Anxiety, Depression, Tobacco Use, and chronic pain and falls and safety concerns disease process and self health management plan as evidenced by keeping appointments, following the plan of care, calling the office for changes or questions, and working with the CCM team to optimize health and well being. take all medications exactly as prescribed and will call provider for medication related questions as evidenced by taking medications as prescribed and calling for refills before running out of medications    attend all scheduled medical appointments: with pcp and specialist as evidenced by keeping appointments and calling for schedule change needs        demonstrate improved and  ongoing adherence to prescribed treatment plan for HTN, HLD, COPD, Anxiety, Depression, Tobacco Use, and Chronic pain and smoker as evidenced by stable VS, routine lab work, following the plan of care and calling for changes in chronic conditions continue to work with Consulting civil engineer and/or Social Worker to address care management and care coordination needs related to HTN, HLD, COPD, Anxiety, Depression, Osteoarthritis, and smoker and falls and safety concerns as evidenced by adherence to CM Team Scheduled appointments     work with pharmacist to address Financial constraints related to paying for inhalers and Medication procurement related to COPD as evidenced by review of EMR and patient or pharmacist report    demonstrate ongoing self health care management ability for effective management of chronic conditions as evidenced by  working with the CCM team through collaboration with Consulting civil engineer, provider, and care team.   Interventions: 1:1 collaboration with primary care provider regarding development and update of comprehensive plan of care as evidenced by provider attestation and co-signature Inter-disciplinary care team collaboration (see longitudinal plan of care) Evaluation of current treatment plan related to  self management and patient's adherence to plan as established by provider   COPD: (Status: Goal on Track (progressing): YES.) Long Term Goal  [id number]: Duplicate goal resolving- see new plan of care in [facility] Reviewed medications with patient, including use of prescribed maintenance and rescue inhalers, and provided instruction on medication management and the importance of adherence. 12-15-2021: The patient has an appointment with pharm D in July 24 to assist with cost constraints related to Trelegy. The patient has a sample from the pcp office. Reminder given today for  appointment with pharm D on 12-20-2021. Provided patient with basic written and verbal COPD education on self  care/management/and exacerbation prevention Advised patient to track and manage COPD triggers. 11-03-2021: Reviewed with the patient possible triggers. She had an episode last week where she was moving limbs and her breathing was bad and she had a "panic attack" and urinated on self. She said nothing has ever happened like that before. Reflective listening and support given. 12-15-2021: The patient states that her breathing is her biggest issue right now and she does not know why she is having such a hard time. She is not going outside unless she has to do so. Education and support given.  Provided written and verbal instructions on pursed lip breathing and utilized returned demonstration as teach back Provided instruction about proper use of medications used for management of COPD including inhalers. 12-15-2021: The patient has a sample from the pcp office. She has an appointment on 12-20-2021 with the pharm D to assist with cost of inhalers. Reminder provided today. She also has an appointment in September with the pulmonary provider. She would like an earlier appointment.  Advised patient to self assesses COPD action plan zone and make appointment with provider if in the yellow zone for 48 hours without improvement. 12-15-2021: Education and support given.  Advised patient to engage in light exercise as tolerated 3-5 days a week to aid in the the management of COPD Provided education about and advised patient to utilize infection prevention strategies to reduce risk of respiratory infection. 12-15-2021: The patient states last week she had a "thick yellow discharge" from her nose and she has been coughing up this week clear mucus. The patient states it is clear and she denies it being green or brown. Education on calling the office for sx and sx of infection. The patient verbalized understanding.  Discussed the importance of adequate rest and management of fatigue with COPD. 12-15-2021: Education given to pace  activities and not over exert self. Screening for signs and symptoms of depression related to chronic disease state  Assessed social determinant of health barriers  Falls:  (Status: Goal Met.) Long Term Goal  12-15-2021: The patient denies any new falls or issues at this time.  Provided written and verbal education re: potential causes of falls and Fall prevention strategies Reviewed medications and discussed potential side effects of medications such as dizziness and frequent urination Advised patient of importance of notifying provider of falls Assessed for signs and symptoms of orthostatic hypotension Assessed for falls since last encounter. 11-03-2021: States last fall was on Christmas Day but has had some near misses. Education and support given.  Assessed patients knowledge of fall risk prevention secondary to previously provided education Provided patient information for fall alert systems Advised patient to discuss falls and safety prevention  with provider  Anxiety and Depression  (Status: Goal Met.) Long Term Goal  12-15-2021: Patients depression and anxiety stable at this time Evaluation of current treatment plan related to Anxiety and Depression, Mental Health Concerns  self-management and patient's adherence to plan as established by provider. Discussed plans with patient for ongoing care management follow up and provided patient with direct contact information for care management team Advised patient to write down question and concerns to ask the provider at today's visit. She believes she had a panic attack last Thursday and has been fearful to leave her house since; Provided education to patient re: mindfulness activities, doing hobbies she enjoys, socialization with friends, working through  times when she is depressed and anxious and calling the office for new episodes of anxiety or depression; Reviewed medications with patient and discussed compliance she takes Wellbutrin and Zoloft  and these help with her depression; Collaborated with office staff regarding the patient feels she needs to come in and be seen due to her anxiety and stress level. Has an appointment to see Dr. Neomia Dear at 140 pm; Provided patient with working through anxiety and depression, and resources available to help with anxiety and depression  educational materials related to chronic anxiety and depression; Reviewed scheduled/upcoming provider appointments including 11-03-2021 at 140 pm with Dr. Neomia Dear; Social Work referral for depression and anxiety resources and support. Is open to talking to the LCSW and has an appointment with LCSW on 11-24-2021 at 115 pm; Discussed plans with patient for ongoing care management follow up and provided patient with direct contact information for care management team; Advised patient to discuss her new concerns and the events that happened last week when she felt she had a "panic attack" with provider; Screening for signs and symptoms of depression related to chronic disease state;  Assessed social determinant of health barriers;   Hyperlipidemia:  (Status: Goal Met.) Long Term Goal  12-15-2021: Goals met and care plan is being closed. The patient states she has an upcoming appointment with pcp for lab work and evaluation of cholesterol medication. Was changes to Crestor 40 mg back in May. The patient states she cannot tell a difference in leg cramps since switching to Crestor. Recommended that she talk to pcp about this at the visit on 12-27-2021 Lab Results  Component Value Date   CHOL 154 05/07/2021   HDL 44 05/07/2021   LDLCALC 86 05/07/2021   TRIG 133 05/07/2021   CHOLHDL 2.3 12/15/2020     Medication review performed; medication list updated in electronic medical record. 11-03-2021 the patient takes Crestor 40 mg QD Provider established cholesterol goals reviewed. 11-03-2021: The patient is at goal; Counseled on importance of regular laboratory monitoring as  prescribed; Provided HLD educational materials. Putting information in AVS for office to print off and give to the patient; Reviewed role and benefits of statin for ASCVD risk reduction; Discussed strategies to manage statin-induced myalgias; Reviewed importance of limiting foods high in cholesterol; Reviewed exercise goals and target of 150 minutes per week;  Hypertension: (Status: Goal Met.) Long Term Goal  12-15-2021 :Goals met and care plan is being closed  Last practice recorded BP readings:  BP Readings from Last 3 Encounters:  11/29/21 128/80  11/03/21 (!) 147/97  10/20/21 105/71  Most recent eGFR/CrCl:  Lab Results  Component Value Date   EGFR 70 06/30/2021    No components found for: CRCL  Evaluation of current treatment plan related to hypertension self management and patient's adherence to plan as established by provider;   Provided education to patient re: stroke prevention, s/s of heart attack and stroke; Reviewed prescribed diet 11-03-2021: Heart healthy diet Reviewed medications with patient and discussed importance of compliance;  Discussed plans with patient for ongoing care management follow up and provided patient with direct contact information for care management team; Advised patient, providing education and rationale, to monitor blood pressure daily and record, calling PCP for findings outside established parameters;  Reviewed scheduled/upcoming provider appointments including:  Advised patient to discuss HTN and blood pressures trends with provider; Provided education on prescribed diet heart healthy;  Discussed complications of poorly controlled blood pressure such as heart disease, stroke, circulatory complications, vision  complications, kidney impairment, sexual dysfunction;   Pain:  (Status: Goal Met.) Long Term Goal  12-15-2021: The patient rates her generalized pain at a 6 today on a scale of 0-10. States it is mainly in her back and shoulders. She takes muscle  relaxer's and Tylenol. Ask the pcp about referral for pain management but the pcp is concerned due to the patients breathing.   Pain assessment performed. 11-03-2021: The patient has generalized pain in back, arms, neck, down right leg. Rates her pain level at a 6 today Medications reviewed. 11-03-2021: Is compliant with medications Reviewed provider established plan for pain management. 11-03-2021: The patient has see [facility] and they want her to have a MRI. The insurance will pay for it but [facility] states she has to pay 160.00 and she is not pleased with this since her insurance told her it was a covered expense. Encouraged the patient to call her insurance and inquire about other places she can go; Discussed importance of adherence to all scheduled medical appointments. 11-03-2021: An appointment with the pcp was secured for today. Counseled on the importance of reporting any/all new or changed pain symptoms or management strategies to pain management provider; Advised patient to report to care team affect of pain on daily activities; Discussed use of relaxation techniques and/or diversional activities to assist with pain reduction (distraction, imagery, relaxation, massage, acupressure, TENS, heat, and cold application; Reviewed with patient prescribed pharmacological and nonpharmacological pain relief strategies; Advised patient to discuss unresolved pain, changes in level and intensity of pain and other pain management options as [facility] is not working for her with provider;   Smoking Cessation: (Status: Condition stable. Not addressed this visit.) Long Term Goal  12-15-2021: Goals met and care plan is being closed  Reviewed smoking history:  tobacco abuse of 53 years; currently smoking 1 ppd (In past smoked 2 ppd) Previous quit attempts, unsuccessful- has never tried to quit, successful using has never tried to quit  Reports smoking within 30 minutes of waking up Reports triggers to smoke  include: dog that is 61 years old not doing good and "my nerves are shot", own health conditions, and stressors in her life Reports motivation to quit smoking includes: none at this time On a scale of 1-10, reports MOTIVATION to quit is 0 On a scale of 1-10, reports CONFIDENCE in quitting is 0  Evaluation of current treatment plan reviewed; Advised patient to discuss smoking cessation options with provider; Provided contact information for [place] [facility]uit [facility] (1-800-QUIT-NOW); Reviewed smoking cessation techniques: removing cigarettes and smoking materials from environment, stress management, substitution of other forms of reinforcement, support of family/friends, and written materials Discussed plans with patient for ongoing care management follow up and provided patient with direct contact information for care management team; Advised patient to discuss options for smoking cessation with provider when she is ready to quit smoking.  Patient Goals/Self-Care Activities: Take medications as prescribed   Attend all scheduled provider appointments Call pharmacy for medication refills 3-7 days in advance of running out of medications Attend church or other social activities Perform all self care activities independently  Perform IADL's (shopping, preparing meals, housekeeping, managing finances) independently Call provider office for new concerns or questions  Work with the social worker to address care coordination needs and will continue to work with the clinical team to address health care and disease management related needs call the Suicide and Crisis Lifeline: 988 call the [place] National Suicide Prevention Lifeline: 438-760-6745 or TTY:  309-335-0601 TTY 434 286 5666) to talk to a trained counselor call 1-800-273-TALK (toll free, 24 hour hotline) if experiencing a [place] or [place]  avoid second hand smoke eliminate smoking in my home identify and avoid work-related  triggers identify and remove indoor air pollutants limit outdoor activity during cold weather listen for public air quality announcements every day do breathing exercises every day begin a symptom diary develop a rescue plan eliminate symptom triggers at home follow rescue plan if symptoms flare-up keep follow-up appointments: see specialist and pcp on a regular basis use an extra pillow to sleep develop a new routine to improve sleep don't eat or exercise right before bedtime eat healthy/prescribed diet: heart healthy get at least 7 to 8 hours of sleep at night use devices that will help like a cane, sock-puller or reacher practice relaxation or meditation daily do exercises in a comfortable position that makes breathing as easy as possible check blood pressure weekly choose a place to take my blood pressure (home, clinic or office, retail store) write blood pressure results in a log or diary learn about high blood pressure keep a blood pressure log take blood pressure log to all doctor appointments call doctor for signs and symptoms of high blood pressure develop an action plan for high blood pressure keep all doctor appointments take medications for blood pressure exactly as prescribed report new symptoms to your doctor eat more whole grains, fruits and vegetables, lean meats and healthy fats - call for medicine refill 2 or 3 days before it runs out - take all medications exactly as prescribed - call doctor with any symptoms you believe are related to your medicine - call doctor when you experience any new symptoms - go to all doctor appointments as scheduled - adhere to prescribed diet: Heart Healthy      Patient Care Plan: LCSW Plan of Care     Problem Identified: Coping Skills (General Plan of Care)      Long-Range Goal: Coping Skills Enhanced   Start Date: 11/24/2021  Expected End Date: 02/26/2022  This Visit's Progress: On track  Priority: High  Note:    Current Barriers:  Mental Health Concerns    CSW Clinical Goal(s):  Patient  will demonstrate a reduction in symptoms related to :Depression: anxiety  through collaboration with Clinical Social Worker, provider, and care team.   Interventions: Patient endorses an increase in stress within the last month due to psychosocial stressors LCSW assisted patient in identifying triggers to stressors. Healthy coping skills were discussed to assist with stress management and grounding strategies. Enjoys taking care of two dogs Limited Support CCM LCSW reviewed upcoming appts 1:1 collaboration with primary care provider regarding development and update of comprehensive plan of care as evidenced by provider attestation and co-signature Inter-disciplinary care team collaboration (see longitudinal plan of care) Evaluation of current treatment plan related to  self management and patient's adherence to plan as established by provider Review resources, discussed options and provided patient information about  Care guide referral to assist with food insecurity (meal delivery) Mindfulness or Relaxation training provided Active listening / Reflection utilized  Emotional Support Provided Provided psychoeducation for mental health needs  Verbalization of feelings encouraged  Crisis Resource Education / information provided     Task & activities to accomplish goals: Contact PCP office with any questions or concerns Attend scheduled provider appts Utilize healthy coping skills and/or supportive resources discussed      Patient Care Plan: [place]  Problem Identified: Disease State Management   Priority: High  Onset Date: 12/20/2021     Long-Range Goal: COPD, BP, Depression   Start Date: 12/20/2021  Expected End Date: 12/20/2022  This Visit's Progress: On track  Priority: High  Note:   Current Barriers:  Does not contact provider office for questions/concerns  Pharmacist  Clinical Goal(s):  Patient will contact provider office for questions/concerns as evidenced notation of same in electronic health record through collaboration with PharmD and provider.   Interventions: 1:1 collaboration with Venita Lick, NP regarding development and update of comprehensive plan of care as evidenced by provider attestation and co-signature Inter-disciplinary care team collaboration (see longitudinal plan of care) Comprehensive medication review performed; medication list updated in electronic medical record  Insomnia (Goal: 7-8 hours of sleep/night) -Uncontrolled -Patient currently getting 3-4 hours of sleep/night -Patient currently waking up 1 times/night -Patient's concern is Both falling/staying alseep -Current treatment: None -Medications previously tried: N/A -Counseled on sleep hygiene techniques (consistent sleep/wake up schedule, no electronic screens 1-2 hours before bed, etc) July 2023: Patient tries to sleep at 11PM but doesn't go to bed until 2-3. She stays on the couch and watches TV until she feels tired (Around 2-3PM). She wakes up at 7AM. Does not exercise or leave the house most days Plan: Will not have TV on from 10-11PM. Counseled on melatonin release and how TV/Phone will inhibit this. Also, patient has 2 dogs, Patches and "Lil bit." She will start taking them for a walk daily to exercise. I counseled on how she's probably not tired at night because she hasn't done anything during the day to make her tired. She expressed, "That makes a lot of sense."   Hypertension (BP goal <130/80) BP Readings from Last 3 Encounters:  11/29/21 128/80  11/03/21 (!) 147/97  10/20/21 105/71  -Controlled -Current treatment: Amlodipine 38m QD Appropriate, Effective, Safe, Accessible Carvedilol 12.522mBID Appropriate, Effective, Safe, Accessible Olmesartan 4051mppropriate, Effective, Safe, Accessible -Medications previously tried: N/A  -Current home readings:  Doesn't test -Current dietary habits: "Tries to eat healthy" -Current exercise habits: None -Denies hypotensive/hypertensive symptoms -Educated on BP goals and benefits of medications for prevention of heart attack, stroke and kidney damage; -Counseled to monitor BP at home Weekly, document, and provide log at future appointments -Recommended to continue current medication  Hyperlipidemia: (LDL goal < 100) The 10-year ASCVD risk score (Arnett DK, et al., 2019) is: 15.1%   Values used to calculate the score:     Age: [age]     Sex: Female     Is Non-Hispanic African American: No     Diabetic: No     Tobacco smoker: Yes     Systolic Blood Pressure: 128353Hg     Is BP treated: Yes     HDL Cholesterol: 44 mg/dL     Total Cholesterol: 154 mg/dL Lab Results  Component Value Date   CHOL 154 05/07/2021   CHOL 128 12/15/2020   CHOL 184 03/23/2020   Lab Results  Component Value Date   HDL 44 05/07/2021   HDL 56 12/15/2020   HDL 56 03/23/2020   Lab Results  Component Value Date   LDLCALC 86 05/07/2021   LDLCALC 52 12/15/2020   LDLCALC 106 (H) 03/23/2020   Lab Results  Component Value Date   TRIG 133 05/07/2021   TRIG 109 12/15/2020   TRIG 122 03/23/2020   Lab Results  Component Value Date   CHOLHDL 2.3 12/15/2020   CHOLHDL 2.4  04/14/2017   CHOLHDL 3.0 04/06/2016  No results found for: "LDLDIRECT" -Controlled -Current treatment: Rosuvastatin 57m Appropriate, Effective, Safe, Accessible -Medications previously tried: Atorvastatin -Current dietary patterns: "Tries to eat healthy" -Current exercise habits: None -Educated on Cholesterol goals;  -Recommended to continue current medication  COPD (Goal: control symptoms and prevent exacerbations) -Controlled -Current treatment  Albuterol Appropriate, Effective, Safe, Accessible [place]ast 171mQD Appropriate, Effective, Safe, Accessible Trelegy Appropriate, Effective, Safe, Query accessible -Medications previously  tried: N/A  -Gold Grade: Unknown -Current COPD Classification:  Unknown -MMRC/CAT score:      No data to display        -Pulmonary function testing: Pulmonary Functions Testing Results:  No results found for: "FEV1", "FVC", "FEV1FVC", "TLC", "DLCO"  -Exacerbations requiring treatment in last 6 months: None -Patient denies consistent use of maintenance inhaler -Frequency of rescue inhaler use: Rarely, she couldn't even remember last time she used it -Counseled on Proper inhaler technique; July 2023: PAP on Trelegy, can't afford  Depression/Anxiety (Goal: PHQ9<5) -Not ideally controlled -Current treatment: Hydroxyzine 2567mRN Appropriate, Query effective,  Sertraline 150m51mpropriate, Query effective,  -Medications previously tried/failed: N/aA -PHQ9:     11/29/2021    3:42 PM 11/03/2021    2:01 PM 10/20/2021   11:18 AM  Depression screen PHQ 2/9  Decreased Interest '2 2 2  ' Down, Depressed, Hopeless '2 2 1  ' PHQ - 2 Score '4 4 3  ' Altered sleeping '2 2 1  ' Tired, decreased energy '3 2 3  ' Change in appetite 1 0 1  Feeling bad or failure about yourself  '1 1 2  ' Trouble concentrating '1 1 1  ' Moving slowly or fidgety/restless 0 0 1  Suicidal thoughts 0 0 0  PHQ-9 Score '12 10 12  ' Difficult doing work/chores Somewhat difficult Somewhat difficult   -GAD7:     11/29/2021    3:42 PM 11/03/2021    2:03 PM 10/20/2021   11:18 AM 05/07/2021   10:01 AM  GAD 7 : Generalized Anxiety Score  Nervous, Anxious, on Edge '2 1 3 3  ' Control/stop worrying '3 2 3 2  ' Worry too much - different things '3 3 3 3  ' Trouble relaxing '3 3 3 3  ' Restless '3 2 3 1  ' Easily annoyed or irritable '3 1 2 1  ' Afraid - awful might happen '1 1 1 ' 0  Total GAD 7 Score '18 13 18 13  ' Anxiety Difficulty Very difficult Somewhat difficult Very difficult Not difficult at all  -Educated on Benefits of medication for symptom control Benefits of cognitive-behavioral therapy with or without medication July 2023: Patient isn't at goal. When  introducing herself, she told me she has only 2 friends (And emphasized, "I mean only a couple") and no family. She doesn't leave her house and when I asked, "How often do you leave?" She stated, "As little as possible." Main priority today was cost of medications, will focus on mental health at f/u visit once I build a relationship with her  Patient Goals/Self-Care Activities Patient will:  - take medications as prescribed as evidenced by patient report and record review  Follow Up Plan: The patient has been provided with contact information for the care management team and has been advised to call with any health related questions or concerns.   CPP F/U 6 months  [place] Constablearm.D. - [phone]   Ms. ShamDaum given information about Chronic Care Management services today including:  CCM service includes personalized support from designated clinical  staff supervised by her physician, including individualized plan of care and coordination with other care providers 24/7 contact phone numbers for assistance for urgent and routine care needs. Standard insurance, coinsurance, copays and deductibles apply for chronic care management only during months in which we provide at least 20 minutes of these services. Most insurances cover these services at 100%, however patients may be responsible for any copay, coinsurance and/or deductible if applicable. This service may help you avoid the need for more expensive face-to-face services. Only one practitioner may furnish and bill the service in a calendar month. The patient may stop CCM services at any time (effective at the end of the month) by phone call to the office staff.  Patient agreed to services and verbal consent obtained.   The patient verbalized understanding of instructions, educational materials, and care plan provided today and DECLINED offer to receive copy of patient instructions, educational materials, and care plan.   The pharmacy team will reach out to the patient again over the next 60 days.   Lane Hacker, [place]

## 2021-12-20 NOTE — Progress Notes (Signed)
Chronic Care Management Pharmacy Note  12/20/2021 Name:  Caitlyn May MRN:  [id number] DOB:  [birth year]  Summary: -Pleasant 66 year old female presents for initial CCM visit. She has 2 dogs, "Patches" a 100 pound bull dog and "Lil bit" a chihuahua. She has 2 great friends and no family to speak of. She does not leave the house nor exercise. States, "Laughing is rare for me"  Recommendations/Changes made from today's visit: -Trelegy PAP -Onboard to [facility] -Counseled extensively on sleep hygiene (See Care Plan)  Subjective: Caitlyn May is an 66 y.o. year old female who is a primary patient of Cannady, Barbaraann Faster, NP.  The CCM team was consulted for assistance with disease management and care coordination needs.    Engaged with patient by telephone for initial visit in response to provider referral for pharmacy case management and/or care coordination services.   Consent to Services:  The patient was given the following information about Chronic Care Management services today, agreed to services, and gave verbal consent: 1. CCM service includes personalized support from designated clinical staff supervised by the primary care provider, including individualized plan of care and coordination with other care providers 2. 24/7 contact phone numbers for assistance for urgent and routine care needs. 3. Service will only be billed when office clinical staff spend 20 minutes or more in a month to coordinate care. 4. Only one practitioner may furnish and bill the service in a calendar month. 5.The patient may stop CCM services at any time (effective at the end of the month) by phone call to the office staff. 6. The patient will be responsible for cost sharing (co-pay) of up to 20% of the service fee (after annual deductible is met). Patient agreed to services and consent obtained.  Patient Care Team: Venita Lick, NP as PCP - General (Nurse Practitioner) Kate Sable, MD as PCP -  Cardiology (Cardiology) Vanita Ingles, RN as Registered Nurse ([place]) Lane Hacker, [facility] (Pharmacist)  Recent office visits:  11/29/21-Jolene T. Cannady, NP (PCP) Seen for a fall. stop Benazepril and change to Olmesartan 40 mg. Ambulatory referral to Pulmonology. Follow up in 4 weeks. 11/03/21-Avanti Vigg, MD (Video visit) Seen for panic attack. 10/20/21-Jolene T. Ned Card, NP (PCP) Seen for medication management.Will change to Tizanidine and stop Flexeril. Follow up in 6 weeks. 06/28/21-Jolene T. Ned Card, NP (PCP) Seen for abdominal pain. Labs ordered. Follow up in 3 days.   Recent consult visits:  07/02/21-Caitlyn Kayleen Memos, MD [facility]) Notes not available. 06/22/21-Caitlyn May (Cardiology) Notes not available.    Hospital visits:  None in previous 6 months   Objective:  Lab Results  Component Value Date   CREATININE 0.91 06/30/2021   BUN 15 06/30/2021   EGFR 70 06/30/2021   GFRNONAA 50 (L) 04/30/2020   GFRAA 58 (L) 04/30/2020   NA 143 06/30/2021   K 3.4 (L) 06/30/2021   CALCIUM 9.1 06/30/2021   CO2 22 06/30/2021   GLUCOSE 84 06/30/2021    No results found for: "HGBA1C", "FRUCTOSAMINE", "GFR", "MICROALBUR"  Last diabetic Eye exam: No results found for: "HMDIABEYEEXA"  Last diabetic Foot exam: No results found for: "HMDIABFOOTEX"   Lab Results  Component Value Date   CHOL 154 05/07/2021   HDL 44 05/07/2021   LDLCALC 86 05/07/2021   TRIG 133 05/07/2021   CHOLHDL 2.3 12/15/2020       Latest Ref Rng & Units 06/30/2021    1:32 PM 05/07/2021   11:06 AM 12/15/2020   10:39  AM  Hepatic Function  Total Protein 6.0 - 8.5 g/dL 6.4  7.1  7.0   Albumin 3.8 - 4.8 g/dL 4.2  4.7  4.3   AST 0 - 40 IU/L _0 ALT 0 - 32 IU/L _1 Alk Phosphatase 44 - 121 IU/L 76  65  71   Total Bilirubin 0.0 - 1.2 mg/dL <0.2  0.4  0.5     Lab Results  Component Value Date/Time   TSH 2.680 06/30/2021 01:32 PM   TSH 5.700 (H) 05/07/2021 11:06 AM   FREET4  1.17 06/30/2021 01:32 PM       Latest Ref Rng & Units 06/30/2021    1:32 PM 05/07/2021   11:06 AM 12/15/2020   10:39 AM  CBC  WBC 3.4 - 10.8 x10E3/uL 9.4  9.8  10.4   Hemoglobin 11.1 - 15.9 g/dL 14.3  15.3  14.6   Hematocrit 34.0 - 46.6 % 42.2  45.6  44.4   Platelets 150 - 450 x10E3/uL 233  255  291     Lab Results  Component Value Date/Time   VD25OH 37.0 12/15/2020 10:39 AM   VD25OH 29.2 (L) 03/23/2020 02:44 PM    Clinical ASCVD: Yes  The 10-year ASCVD risk score (Arnett DK, et al., 2019) is: 15.1%   Values used to calculate the score:     Age: 2 years     Sex: Female     Is Non-Hispanic African American: No     Diabetic: No     Tobacco smoker: Yes     Systolic Blood Pressure: 628 mmHg     Is BP treated: Yes     HDL Cholesterol: 44 mg/dL     Total Cholesterol: 154 mg/dL       11/29/2021    3:42 PM 11/03/2021    2:01 PM 10/20/2021   11:18 AM  Depression screen PHQ 2/9  Decreased Interest _2 Down, Depressed, Hopeless _3 PHQ - 2 Score _4 Altered sleeping _5 Tired, decreased energy _6 Change in appetite 1 0 1  Feeling bad or failure about yourself  _7 Trouble concentrating _8 Moving slowly or fidgety/restless 0 0 1  Suicidal thoughts 0 0 0  PHQ-9 Score _9 Difficult doing work/chores Somewhat difficult Somewhat difficult      Other: (CHADS2VASc if Afib, MMRC or CAT for COPD, ACT, DEXA)  Social History   Tobacco Use  Smoking Status Every Day   Packs/day: 1.00   Types: Cigarettes  Smokeless Tobacco Never   BP Readings from Last 3 Encounters:  11/29/21 128/80  11/03/21 (!) 147/97  10/20/21 105/71   Pulse Readings from Last 3 Encounters:  11/29/21 68  11/03/21 75  10/20/21 84   Wt Readings from Last 3 Encounters:  11/29/21 126 lb 12.8 oz (57.5 kg)  10/20/21 126 lb 9.6 oz (57.4 kg)  05/07/21 113 lb 6.4 oz (51.4 kg)   BMI Readings from Last 3 Encounters:  11/29/21 20.47 kg/m  10/20/21 20.43 kg/m  05/07/21 18.30 kg/m     Assessment/Interventions: Review of patient past medical history, allergies, medications, health status, including review of consultants reports, laboratory and other test data, was performed as part of comprehensive evaluation and provision of chronic care management services.   SDOH:  (Social Determinants of Health) assessments and interventions performed: Yes  SDOH Interventions    Flowsheet Row Most Recent Value  SDOH Interventions   Financial Strain Interventions Other (Comment)  [PAP (Trelegy)]  Physical Activity Interventions Intervention Not Indicated      SDOH Screenings   Alcohol Screen: Low Risk  (06/22/2021)   Alcohol Screen    Last Alcohol Screening Score (AUDIT): 0  Depression (PHQ2-9): Medium Risk (11/29/2021)   Depression (PHQ2-9)    PHQ-2 Score: 12  Financial Resource Strain: High Risk (12/20/2021)   Overall Financial Resource Strain (CARDIA)    Difficulty of Paying Living Expenses: Hard  Food Insecurity: No Food Insecurity (12/15/2021)   Hunger Vital Sign    Worried About Running Out of Food in the Last Year: Never true    Ran Out of Food in the Last Year: Never true  Housing: Low Risk  (06/22/2021)   Housing    Last Housing Risk Score: 0  Physical Activity: Inactive (12/20/2021)   Exercise Vital Sign    Days of Exercise per Week: 0 days    Minutes of Exercise per Session: 0 min  Social Connections: Socially Isolated (11/03/2021)   Social Connection and Isolation Panel [NHANES]    Frequency of Communication with Friends and Family: More than three times a week    Frequency of Social Gatherings with Friends and Family: More than three times a week    Attends Religious Services: Never    Marine scientist or Organizations: No    Attends Archivist Meetings: Never    Marital Status: Divorced  Stress: Stress Concern Present (06/22/2021)   [employer] [place]    Feeling of Stress : To some  extent  Tobacco Use: High Risk (12/15/2021)   Patient History    Smoking Tobacco Use: Every Day    Smokeless Tobacco Use: Never    Passive Exposure: Not on file  Transportation Needs: No Transportation Needs (06/22/2021)   PRAPARE - Transportation    Lack of Transportation (Medical): No    Lack of Transportation (Non-Medical): No    CCM Care Plan  Allergies  Allergen Reactions   Accupril [Quinapril Hcl]    Avelox [Moxifloxacin]    Ciprofloxacin    Hctz [Hydrochlorothiazide] Other (See Comments)    Leg swelling   Penicillins    Sulfa Antibiotics     Medications Reviewed Today     Reviewed by Lane Hacker, [facility] (Pharmacist) on 12/20/21 at 57  Med List Status: <None>   Medication Order Taking? Sig Documenting Provider Last Dose Status Informant  albuterol (VENTOLIN HFA) 108 (90 Base) MCG/ACT inhaler [id number] No Inhale 2 puffs into the lungs every 6 (six) hours as needed for wheezing or shortness of breath. Vigg, Avanti, MD Taking Active   amLODipine (NORVASC) 5 MG tablet [id number] No Take 1 tablet (5 mg total) by mouth daily. Marnee Guarneri T, NP Taking Active   buPROPion (WELLBUTRIN XL) 150 MG 24 hr tablet [id number] No Take 1 tablet (150 mg total) by mouth daily. Marnee Guarneri T, NP Taking Active   carvedilol (COREG) 12.5 MG tablet [id number] No Take 1 tablet (12.5 mg total) by mouth 2 (two) times daily. Marnee Guarneri T, NP Taking Active   Fluticasone-Umeclidin-Vilant (TRELEGY ELLIPTA) 100-62.5-25 MCG/ACT AEPB [id number] No Inhale 1 puff into the lungs daily. Marnee Guarneri T, NP Taking Active   gabapentin (NEURONTIN) 300 MG capsule [id number] No Take 1 capsule (300 mg total) by mouth 3 (three) times daily. Venita Lick, NP Taking Active  hydrOXYzine (VISTARIL) 25 MG capsule [id number] No Take 1 capsule (25 mg total) by mouth every 8 (eight) hours as needed. Charlynne Cousins, MD Taking Active   megestrol (MEGACE) 40 MG tablet [id number] No Take 1 tablet (40 mg total) by  mouth 4 (four) times daily as needed. Marnee Guarneri T, NP Taking Active   montelukast (SINGULAIR) 10 MG tablet [id number] No Take 1 tablet (10 mg total) by mouth at bedtime. Marnee Guarneri T, NP Taking Active   olmesartan (BENICAR) 40 MG tablet [id number]  Take 1 tablet (40 mg total) by mouth daily. Marnee Guarneri T, NP  Active   ondansetron (ZOFRAN ODT) 8 MG disintegrating tablet [id number] No Take 1 tablet (8 mg total) by mouth every 8 (eight) hours as needed for nausea or vomiting. Volney American, [place] Taking Active   pantoprazole (PROTONIX) 40 MG tablet [id number] No Take 1 tablet (40 mg total) by mouth at bedtime. Marnee Guarneri T, NP Taking Active   rosuvastatin (CRESTOR) 40 MG tablet [id number] No Take 1 tablet (40 mg total) by mouth daily. Marnee Guarneri T, NP Taking Active   sertraline (ZOLOFT) 100 MG tablet [id number] No Take 1.5 tablets (150 mg total) by mouth daily. Marnee Guarneri T, NP Taking Active   tiZANidine (ZANAFLEX) 4 MG tablet [id number] No Take 1 tablet (4 mg total) by mouth every 6 (six) hours as needed for muscle spasms. Venita Lick, NP Taking Active             Patient Active Problem List   Diagnosis Date Noted   Aortic atherosclerosis ([place]) 08/19/2020   Centrilobular emphysema ([place]) 08/20/2019   Anxiety 04/19/2018   Syncope 12/11/2017   Allergic rhinitis 09/28/2017   Advanced care planning/counseling discussion 04/14/2017   Vitamin D deficiency 02/07/2017   Osteoarthritis of hip (Bilateral) 11/29/2016   Chronic sacroiliac joint pain (Bilateral) 11/29/2016   Chronic pain syndrome 11/28/2016   Failed back surgical syndrome 11/28/2016   Chronic low back pain (Location of Primary Source of Pain) (Bilateral) (R>L) 11/28/2016   Chronic lower extremity pain (Bilateral) (R>L) 11/28/2016   Chronic neck pain (Location of Secondary source of pain) (Bilateral) (R>L) 11/28/2016   DDD (degenerative disc disease), lumbar 11/28/2016   Elevated TSH 07/08/2015    Acid reflux 11/19/2014   Depression, recurrent ([place])    Hyperlipidemia    Hypertension    Postlaminectomy syndrome, lumbar region 11/17/2014    Immunization History  Administered Date(s) Administered   Fluad Quad(high Dose 65+) 03/23/2020, 05/07/2021   Influenza, High Dose Seasonal PF 04/14/2017   Influenza,inj,Quad PF,6+ Mos 03/31/2015, 02/10/2016, 03/30/2018, 06/10/2019   Influenza-Unspecified 03/11/2014   Pneumococcal Conjugate-13 03/23/2020   Pneumococcal Polysaccharide-23 05/07/2021   Pneumococcal-Unspecified 11/01/2007   Td 11/01/2007    Conditions to be addressed/monitored:  Hypertension, Hyperlipidemia, COPD, Depression, and Anxiety  Care Plan : [place]  Updates made by Lane Hacker, [place] since 12/20/2021 12:00 AM     Problem: Disease State Management   Priority: High  Onset Date: 12/20/2021     Long-Range Goal: COPD, BP, Depression   Start Date: 12/20/2021  Expected End Date: 12/20/2022  This Visit's Progress: On track  Priority: High  Note:   Current Barriers:  Does not contact provider office for questions/concerns  Pharmacist Clinical Goal(s):  Patient will contact provider office for questions/concerns as evidenced notation of same in electronic health record through collaboration with PharmD and provider.   Interventions: 1:1 collaboration with Venita Lick, NP regarding development and update of  comprehensive plan of care as evidenced by provider attestation and co-signature Inter-disciplinary care team collaboration (see longitudinal plan of care) Comprehensive medication review performed; medication list updated in electronic medical record  Insomnia (Goal: 7-8 hours of sleep/night) -Uncontrolled -Patient currently getting 3-4 hours of sleep/night -Patient currently waking up 1 times/night -Patient's concern is Both falling/staying alseep -Current treatment: None -Medications previously tried: N/A -Counseled on sleep hygiene  techniques (consistent sleep/wake up schedule, no electronic screens 1-2 hours before bed, etc) July 2023: Patient tries to sleep at 11PM but doesn't go to bed until 2-3. She stays on the couch and watches TV until she feels tired (Around 2-3PM). She wakes up at 7AM. Does not exercise or leave the house most days Plan: Will not have TV on from 10-11PM. Counseled on melatonin release and how TV/Phone will inhibit this. Also, patient has 2 dogs, Patches and "Lil bit." She will start taking them for a walk daily to exercise. I counseled on how she's probably not tired at night because she hasn't done anything during the day to make her tired. She expressed, "That makes a lot of sense."   Hypertension (BP goal <130/80) BP Readings from Last 3 Encounters:  11/29/21 128/80  11/03/21 (!) 147/97  10/20/21 105/71  -Controlled -Current treatment: Amlodipine 79m QD Appropriate, Effective, Safe, Accessible Carvedilol 12.596mBID Appropriate, Effective, Safe, Accessible Olmesartan 4057mppropriate, Effective, Safe, Accessible -Medications previously tried: N/A  -Current home readings: Doesn't test -Current dietary habits: "Tries to eat healthy" -Current exercise habits: None -Denies hypotensive/hypertensive symptoms -Educated on BP goals and benefits of medications for prevention of heart attack, stroke and kidney damage; -Counseled to monitor BP at home Weekly, document, and provide log at future appointments -Recommended to continue current medication  Hyperlipidemia: (LDL goal < 100) The 10-year ASCVD risk score (Arnett DK, et al., 2019) is: 15.1%   Values used to calculate the score:     Age: [age]     Sex: Female     Is Non-Hispanic African American: No     Diabetic: No     Tobacco smoker: Yes     Systolic Blood Pressure: 128808Hg     Is BP treated: Yes     HDL Cholesterol: 44 mg/dL     Total Cholesterol: 154 mg/dL Lab Results  Component Value Date   CHOL 154 05/07/2021   CHOL 128  12/15/2020   CHOL 184 03/23/2020   Lab Results  Component Value Date   HDL 44 05/07/2021   HDL 56 12/15/2020   HDL 56 03/23/2020   Lab Results  Component Value Date   LDLCALC 86 05/07/2021   LDLCALC 52 12/15/2020   LDLCALC 106 (H) 03/23/2020   Lab Results  Component Value Date   TRIG 133 05/07/2021   TRIG 109 12/15/2020   TRIG 122 03/23/2020   Lab Results  Component Value Date   CHOLHDL 2.3 12/15/2020   CHOLHDL 2.4 04/14/2017   CHOLHDL 3.0 04/06/2016  No results found for: "LDLDIRECT" -Controlled -Current treatment: Rosuvastatin 39m20mpropriate, Effective, Safe, Accessible -Medications previously tried: Atorvastatin -Current dietary patterns: "Tries to eat healthy" -Current exercise habits: None -Educated on Cholesterol goals;  -Recommended to continue current medication  COPD (Goal: control symptoms and prevent exacerbations) -Controlled -Current treatment  Albuterol Appropriate, Effective, Safe, Accessible Monte[place]ast 10mg37mAppropriate, Effective, Safe, Accessible Trelegy Appropriate, Effective, Safe, Query accessible -Medications previously tried: N/A  -Gold Grade: Unknown -Current COPD Classification:  Unknown -MMRC/CAT score:      No  data to display        -Pulmonary function testing: Pulmonary Functions Testing Results:  No results found for: "FEV1", "FVC", "FEV1FVC", "TLC", "DLCO"  -Exacerbations requiring treatment in last 6 months: None -Patient denies consistent use of maintenance inhaler -Frequency of rescue inhaler use: Rarely, she couldn't even remember last time she used it -Counseled on Proper inhaler technique; July 2023: PAP on Trelegy, can't afford  Depression/Anxiety (Goal: PHQ9<5) -Not ideally controlled -Current treatment: Hydroxyzine 24m PRN Appropriate, Query effective,  Sertraline 1561mAppropriate, Query effective,  -Medications previously tried/failed: N/aA -PHQ9:     11/29/2021    3:42 PM 11/03/2021    2:01 PM 10/20/2021    11:18 AM  Depression screen PHQ 2/9  Decreased Interest _0 Down, Depressed, Hopeless _1 PHQ - 2 Score _2 Altered sleeping _3 Tired, decreased energy _4 Change in appetite 1 0 1  Feeling bad or failure about yourself  _5 Trouble concentrating _6 Moving slowly or fidgety/restless 0 0 1  Suicidal thoughts 0 0 0  PHQ-9 Score _7 Difficult doing work/chores Somewhat difficult Somewhat difficult   -GAD7:     11/29/2021    3:42 PM 11/03/2021    2:03 PM 10/20/2021   11:18 AM 05/07/2021   10:01 AM  GAD 7 : Generalized Anxiety Score  Nervous, Anxious, on Edge _8 Control/stop worrying _9 Worry too much - different things _10 Trouble relaxing _11 Restless _12 Easily annoyed or irritable _13 Afraid - awful might happen _14 0  Total GAD 7 Score _15 Anxiety Difficulty Very difficult Somewhat difficult Very difficult Not difficult at all  -Educated on Benefits of medication for symptom control Benefits of cognitive-behavioral therapy with or without medication July 2023: Patient isn't at goal. When introducing herself, she told me she has only 2 friends (And emphasized, "I mean only a couple") and no family. She doesn't leave her house and when I asked, "How often do you leave?" She stated, "As little as possible." Main priority today was cost of medications, will focus on mental health at f/u visit once I build a relationship with her  Patient Goals/Self-Care Activities Patient will:  - take medications as prescribed as evidenced by patient report and record review  Follow Up Plan: The patient has been provided with contact information for the care management team and has been advised to call with any health related questions or concerns.   CPP F/U 6 months  [place] ConstablePharm.D. - [phone]        Medication Assistance: Application for Trelegy  medication assistance program. in process.  Anticipated  assistance start date August.  See plan of care for additional detail.  Compliance/Adherence/Medication fill history: Care Gaps: Zoster Vaccines:Never done TETANUS/TDAP:Last completed: Nov 01, 2007   Star Rating Drugs: Olmesartan 40 mg Last filled:10/20/21 90 DS Rosuvastatin 40 mg Last filled:10/20/21 90 DS Carvedilol 12.5 mg Last filled:12/06/21 90 DS  [place]:  [place] [address]: [id number]: [address] [address]  [place] [address] Phone: [phone] Fax: [facility] [place], [place] [address] [facility] [address] [place] [address] Phone: [phone] Fax: [phone]  Uses pill box? No -   Pt endorses 100% compliance  We discussed: Benefits of medication synchronization, packaging and delivery as well as enhanced pharmacist oversight with [facility]. Patient decided to: Utilize [facility] pharmacy for medication synchronization, packaging and delivery -Verbal consent obtained for [facility] enhanced pharmacy services (medication synchronization, adherence packaging, delivery coordination). A medication sync plan was created to allow patient to get all medications delivered once every 30 to 90 days per patient preference. Patient understands they have freedom to choose pharmacy and clinical pharmacist will coordinate care between all prescribers and [facility].  Care Plan and Follow Up Patient Decision:  Patient agrees to Care Plan and Follow-up.  Plan: The patient has been provided with contact information for the care management team and has been advised to call with any health related questions or concerns.   CPP F/U Jan 2024  [place] Constable, Pharm.D. - [phone]

## 2021-12-27 ENCOUNTER — Ambulatory Visit: Payer: Medicare HMO | Admitting: Nurse Practitioner

## 2021-12-27 DIAGNOSIS — F339 Major depressive disorder, recurrent, unspecified: Secondary | ICD-10-CM | POA: Diagnosis not present

## 2021-12-27 DIAGNOSIS — F32A Depression, unspecified: Secondary | ICD-10-CM

## 2021-12-27 DIAGNOSIS — I10 Essential (primary) hypertension: Secondary | ICD-10-CM | POA: Diagnosis not present

## 2021-12-27 DIAGNOSIS — F17210 Nicotine dependence, cigarettes, uncomplicated: Secondary | ICD-10-CM | POA: Diagnosis not present

## 2021-12-27 DIAGNOSIS — E782 Mixed hyperlipidemia: Secondary | ICD-10-CM | POA: Diagnosis not present

## 2021-12-27 DIAGNOSIS — J449 Chronic obstructive pulmonary disease, unspecified: Secondary | ICD-10-CM | POA: Diagnosis not present

## 2021-12-27 DIAGNOSIS — J432 Centrilobular emphysema: Secondary | ICD-10-CM | POA: Diagnosis not present

## 2021-12-29 ENCOUNTER — Telehealth: Payer: Medicare HMO

## 2022-01-02 ENCOUNTER — Other Ambulatory Visit: Payer: Self-pay | Admitting: Nurse Practitioner

## 2022-01-03 ENCOUNTER — Other Ambulatory Visit: Payer: Self-pay | Admitting: Nurse Practitioner

## 2022-01-03 NOTE — Telephone Encounter (Signed)
Requested medication (s) are due for refill today: yes  Requested medication (s) are on the active medication list: yes  Last refill:  10/20/21 #120 4 refills  Future visit scheduled: yes in 2 weeks  Notes to clinic:  not delegated per protocol     Requested Prescriptions  Pending Prescriptions Disp Refills   megestrol (MEGACE) 40 MG tablet [Pharmacy Med Name: MEGESTROL ACETATE '40MG'$  TABLETS] 120 tablet 4    Sig: TAKE 1 TABLET(40 MG) BY MOUTH FOUR TIMES DAILY AS NEEDED     Not Delegated - OB/GYN:  Progestins - megestrol acetate Failed - 01/02/2022  6:30 AM      Failed - This refill cannot be delegated      Passed - Last BP in normal range    BP Readings from Last 1 Encounters:  11/29/21 128/80         Passed - Valid encounter within last 12 months    Recent Outpatient Visits           1 month ago Centrilobular emphysema ([place])   [facility] Cannady, Henrine Screws T, NP   2 months ago [place], MD   2 months ago Centrilobular emphysema ([place])   [place], Alva T, NP   6 months ago Diarrhea, unspecified type   [place], Jolene T, NP   8 months ago Centrilobular emphysema ([place])   [place], Barbaraann Faster, NP       Future Appointments             In 2 weeks Cannady, Barbaraann Faster, NP [employer]

## 2022-01-04 NOTE — Telephone Encounter (Signed)
Requested medication (s) are due for refill today - no  Requested medication (s) are on the active medication list -yes  Future visit scheduled -yes  Last refill: 09/30/21 #120 4RF  Notes to clinic: non delegated Rx  Requested Prescriptions  Pending Prescriptions Disp Refills   megestrol (MEGACE) 40 MG tablet [Pharmacy Med Name: MEGESTROL ACETATE '40MG'$  TABLETS] 120 tablet 4    Sig: TAKE 1 TABLET(40 MG) BY MOUTH FOUR TIMES DAILY AS NEEDED     Not Delegated - OB/GYN:  Progestins - megestrol acetate Failed - 01/03/2022 11:38 AM      Failed - This refill cannot be delegated      Passed - Last BP in normal range    BP Readings from Last 1 Encounters:  11/29/21 128/80         Passed - Valid encounter within last 12 months    Recent Outpatient Visits           1 month ago Centrilobular emphysema ([place])   [place] Cannady, Henrine Screws T, NP   2 months ago [place], Avanti, MD   2 months ago Centrilobular emphysema ([place])   [place], [place] T, NP   6 months ago Diarrhea, unspecified type   [facility] [place], Jolene T, NP   8 months ago Centrilobular emphysema ([place])   [place], Barbaraann Faster, NP       Future Appointments             In 1 week Cannady, Barbaraann Faster, NP [facility]               Requested Prescriptions  Pending Prescriptions Disp Refills   megestrol (MEGACE) 40 MG tablet [Pharmacy Med Name: MEGESTROL ACETATE '40MG'$  TABLETS] 120 tablet 4    Sig: TAKE 1 TABLET(40 MG) BY MOUTH FOUR TIMES DAILY AS NEEDED     Not Delegated - OB/GYN:  Progestins - megestrol acetate Failed - 01/03/2022 11:38 AM      Failed - This refill cannot be delegated      Passed - Last BP in normal range    BP Readings from Last 1 Encounters:  11/29/21 128/80         Passed - Valid encounter within last 12 months    Recent Outpatient Visits           1 month ago  Centrilobular emphysema ([place])   [place] Cannady, Henrine Screws T, NP   2 months ago [place], MD   2 months ago Centrilobular emphysema ([place])   [place], [place] T, NP   6 months ago Diarrhea, unspecified type   [place], Jolene T, NP   8 months ago Centrilobular emphysema ([place])   [place], Barbaraann Faster, NP       Future Appointments             In 1 week Cannady, Barbaraann Faster, NP [facility]

## 2022-01-05 ENCOUNTER — Telehealth: Payer: Self-pay

## 2022-01-05 NOTE — Chronic Care Management (AMB) (Signed)
    Chronic Care Management Pharmacy Assistant   Name: Caitlyn May  MRN: [id number] DOB: 10-Nov-1955  Reason for Encounter: Patient assistance application for Trelegy   Medications: Outpatient Encounter Medications as of 01/05/2022  Medication Sig   albuterol (VENTOLIN HFA) 108 (90 Base) MCG/ACT inhaler Inhale 2 puffs into the lungs every 6 (six) hours as needed for wheezing or shortness of breath.   amLODipine (NORVASC) 5 MG tablet Take 1 tablet (5 mg total) by mouth daily.   buPROPion (WELLBUTRIN XL) 150 MG 24 hr tablet Take 1 tablet (150 mg total) by mouth daily.   carvedilol (COREG) 12.5 MG tablet Take 1 tablet (12.5 mg total) by mouth 2 (two) times daily.   Fluticasone-Umeclidin-Vilant (TRELEGY ELLIPTA) 100-62.5-25 MCG/ACT AEPB Inhale 1 puff into the lungs daily.   gabapentin (NEURONTIN) 300 MG capsule Take 1 capsule (300 mg total) by mouth 3 (three) times daily.   hydrOXYzine (VISTARIL) 25 MG capsule Take 1 capsule (25 mg total) by mouth every 8 (eight) hours as needed.   megestrol (MEGACE) 40 MG tablet Take 1 tablet (40 mg total) by mouth 4 (four) times daily as needed.   montelukast (SINGULAIR) 10 MG tablet Take 1 tablet (10 mg total) by mouth at bedtime.   olmesartan (BENICAR) 40 MG tablet Take 1 tablet (40 mg total) by mouth daily.   ondansetron (ZOFRAN ODT) 8 MG disintegrating tablet Take 1 tablet (8 mg total) by mouth every 8 (eight) hours as needed for nausea or vomiting.   pantoprazole (PROTONIX) 40 MG tablet Take 1 tablet (40 mg total) by mouth at bedtime.   rosuvastatin (CRESTOR) 40 MG tablet Take 1 tablet (40 mg total) by mouth daily.   sertraline (ZOLOFT) 100 MG tablet Take 1.5 tablets (150 mg total) by mouth daily.   tiZANidine (ZANAFLEX) 4 MG tablet Take 1 tablet (4 mg total) by mouth every 6 (six) hours as needed for muscle spasms.   No facility-administered encounter medications on file as of 01/05/2022.   I have prefilled and mailed out the patient assistance  application for Trelegy medication. I have high lighted the areas where she needs to complete and to bring it back to the office once she completes the form.  Corrie Mckusick, [place]

## 2022-01-10 ENCOUNTER — Other Ambulatory Visit: Payer: Self-pay | Admitting: Nurse Practitioner

## 2022-01-10 ENCOUNTER — Ambulatory Visit (INDEPENDENT_AMBULATORY_CARE_PROVIDER_SITE_OTHER): Payer: Medicare HMO | Admitting: Licensed Clinical Social Worker

## 2022-01-10 DIAGNOSIS — F419 Anxiety disorder, unspecified: Secondary | ICD-10-CM

## 2022-01-10 DIAGNOSIS — I10 Essential (primary) hypertension: Secondary | ICD-10-CM

## 2022-01-10 DIAGNOSIS — F339 Major depressive disorder, recurrent, unspecified: Secondary | ICD-10-CM

## 2022-01-10 DIAGNOSIS — G894 Chronic pain syndrome: Secondary | ICD-10-CM

## 2022-01-10 DIAGNOSIS — M160 Bilateral primary osteoarthritis of hip: Secondary | ICD-10-CM

## 2022-01-10 MED ORDER — AMLODIPINE BESYLATE 5 MG PO TABS: 5.0000 mg | ORAL_TABLET | Freq: Every day | ORAL | 4 refills | Status: DC

## 2022-01-10 MED ORDER — PANTOPRAZOLE SODIUM 40 MG PO TBEC: 40.0000 mg | DELAYED_RELEASE_TABLET | Freq: Every day | ORAL | 4 refills | Status: DC

## 2022-01-10 MED ORDER — GABAPENTIN 300 MG PO CAPS: 300.0000 mg | ORAL_CAPSULE | Freq: Three times a day (TID) | ORAL | 4 refills | Status: DC

## 2022-01-10 MED ORDER — BUPROPION HCL ER (XL) 150 MG PO TB24
150.0000 mg | ORAL_TABLET | Freq: Every day | ORAL | 4 refills | Status: DC
Start: 2022-01-10 — End: 2023-01-26

## 2022-01-10 MED ORDER — OLMESARTAN MEDOXOMIL 40 MG PO TABS: 40.0000 mg | ORAL_TABLET | Freq: Every day | ORAL | 4 refills | Status: DC

## 2022-01-10 MED ORDER — TRELEGY ELLIPTA 100-62.5-25 MCG/ACT IN AEPB
1.0000 | INHALATION_SPRAY | Freq: Every day | RESPIRATORY_TRACT | 11 refills | Status: DC
Start: 2022-01-10 — End: 2023-01-26

## 2022-01-10 MED ORDER — ALBUTEROL SULFATE HFA 108 (90 BASE) MCG/ACT IN AERS
2.0000 | INHALATION_SPRAY | Freq: Four times a day (QID) | RESPIRATORY_TRACT | 1 refills | Status: DC | PRN
Start: 2022-01-10 — End: 2023-01-26

## 2022-01-10 MED ORDER — ROSUVASTATIN CALCIUM 40 MG PO TABS: 40.0000 mg | ORAL_TABLET | Freq: Every day | ORAL | 4 refills | Status: DC

## 2022-01-10 MED ORDER — MONTELUKAST SODIUM 10 MG PO TABS: 10.0000 mg | ORAL_TABLET | Freq: Every day | ORAL | 4 refills | Status: DC

## 2022-01-10 MED ORDER — CARVEDILOL 12.5 MG PO TABS: 12.5000 mg | ORAL_TABLET | Freq: Two times a day (BID) | ORAL | 4 refills | Status: DC

## 2022-01-10 MED ORDER — SERTRALINE HCL 100 MG PO TABS: 150.0000 mg | ORAL_TABLET | Freq: Every day | ORAL | 4 refills | Status: DC

## 2022-01-11 ENCOUNTER — Other Ambulatory Visit: Payer: Self-pay | Admitting: Nurse Practitioner

## 2022-01-11 DIAGNOSIS — F419 Anxiety disorder, unspecified: Secondary | ICD-10-CM

## 2022-01-11 DIAGNOSIS — F339 Major depressive disorder, recurrent, unspecified: Secondary | ICD-10-CM

## 2022-01-11 NOTE — Patient Instructions (Signed)
Visit Information  Thank you for taking time to visit with me today. Please don't hesitate to contact me if I can be of assistance to you before our next scheduled telephone appointment.  Following are the goals we discussed today:  Task & activities to accomplish goals: Contact PCP office with any questions or concerns Attend scheduled provider appts Utilize healthy coping skills and/or supportive resources discussed  Please call the care guide team at [phone] if you need to cancel or reschedule your appointment.   If you are experiencing a Mental Health or [place] or need someone to talk to, please call the Suicide and Crisis Lifeline: 988 call 911   The patient verbalized understanding of instructions, educational materials, and care plan provided today and DECLINED offer to receive copy of patient instructions, educational materials, and care plan.   Scheduling Care Guide will contact you to schedule with LCSW for continued CCM services  Christa See, MSW, [email or web address] Phone [phone] 12:37 PM

## 2022-01-11 NOTE — Chronic Care Management (AMB) (Signed)
Chronic Care Management    Clinical Social Work Note  01/11/2022 Name: Caitlyn May MRN: [id number] DOB: 1956/03/09  Caitlyn May is a 66 y.o. year old female who is a primary care patient of Cannady, Barbaraann Faster, NP. The CCM team was consulted to assist the patient with chronic disease management and/or care coordination needs related to: [employer]  and [place] [facility].   Engaged with patient by telephone for follow up visit in response to provider referral for social work chronic care management and care coordination services.   Consent to Services:  The patient was given information about Chronic Care Management services, agreed to services, and gave verbal consent prior to initiation of services.  Please see initial visit note for detailed documentation.   Patient agreed to services and consent obtained.   Assessment: Review of patient past medical history, allergies, medications, and health status, including review of relevant consultants reports was performed today as part of a comprehensive evaluation and provision of chronic care management and care coordination services.     SDOH (Social Determinants of Health) assessments and interventions performed:    Advanced Directives Status: Not addressed in this encounter.  CCM Care Plan  Allergies  Allergen Reactions   Accupril [Quinapril Hcl]    Avelox [Moxifloxacin]    Ciprofloxacin    Hctz [Hydrochlorothiazide] Other (See Comments)    Leg swelling   Penicillins    Sulfa Antibiotics     Outpatient Encounter Medications as of 01/10/2022  Medication Sig   albuterol (VENTOLIN HFA) 108 (90 Base) MCG/ACT inhaler Inhale 2 puffs into the lungs every 6 (six) hours as needed for wheezing or shortness of breath.   amLODipine (NORVASC) 5 MG tablet Take 1 tablet (5 mg total) by mouth daily.   buPROPion (WELLBUTRIN XL) 150 MG 24 hr tablet Take 1 tablet (150 mg total) by mouth daily.   carvedilol  (COREG) 12.5 MG tablet Take 1 tablet (12.5 mg total) by mouth 2 (two) times daily.   Fluticasone-Umeclidin-Vilant (TRELEGY ELLIPTA) 100-62.5-25 MCG/ACT AEPB Inhale 1 puff into the lungs daily.   gabapentin (NEURONTIN) 300 MG capsule Take 1 capsule (300 mg total) by mouth 3 (three) times daily.   hydrOXYzine (VISTARIL) 25 MG capsule Take 1 capsule (25 mg total) by mouth every 8 (eight) hours as needed.   megestrol (MEGACE) 40 MG tablet Take 1 tablet (40 mg total) by mouth 4 (four) times daily as needed.   montelukast (SINGULAIR) 10 MG tablet Take 1 tablet (10 mg total) by mouth at bedtime.   olmesartan (BENICAR) 40 MG tablet Take 1 tablet (40 mg total) by mouth daily.   ondansetron (ZOFRAN ODT) 8 MG disintegrating tablet Take 1 tablet (8 mg total) by mouth every 8 (eight) hours as needed for nausea or vomiting.   pantoprazole (PROTONIX) 40 MG tablet Take 1 tablet (40 mg total) by mouth at bedtime.   rosuvastatin (CRESTOR) 40 MG tablet Take 1 tablet (40 mg total) by mouth daily.   sertraline (ZOLOFT) 100 MG tablet Take 1.5 tablets (150 mg total) by mouth daily.   tiZANidine (ZANAFLEX) 4 MG tablet Take 1 tablet (4 mg total) by mouth every 6 (six) hours as needed for muscle spasms.   [DISCONTINUED] albuterol (VENTOLIN HFA) 108 (90 Base) MCG/ACT inhaler Inhale 2 puffs into the lungs every 6 (six) hours as needed for wheezing or shortness of breath.   [DISCONTINUED] amLODipine (NORVASC) 5 MG tablet Take 1 tablet (5 mg total) by mouth daily.   [  DISCONTINUED] buPROPion (WELLBUTRIN XL) 150 MG 24 hr tablet Take 1 tablet (150 mg total) by mouth daily.   [DISCONTINUED] carvedilol (COREG) 12.5 MG tablet Take 1 tablet (12.5 mg total) by mouth 2 (two) times daily.   [DISCONTINUED] Fluticasone-Umeclidin-Vilant (TRELEGY ELLIPTA) 100-62.5-25 MCG/ACT AEPB Inhale 1 puff into the lungs daily.   [DISCONTINUED] gabapentin (NEURONTIN) 300 MG capsule Take 1 capsule (300 mg total) by mouth 3 (three) times daily.    [DISCONTINUED] montelukast (SINGULAIR) 10 MG tablet Take 1 tablet (10 mg total) by mouth at bedtime.   [DISCONTINUED] olmesartan (BENICAR) 40 MG tablet Take 1 tablet (40 mg total) by mouth daily.   [DISCONTINUED] pantoprazole (PROTONIX) 40 MG tablet Take 1 tablet (40 mg total) by mouth at bedtime.   [DISCONTINUED] rosuvastatin (CRESTOR) 40 MG tablet Take 1 tablet (40 mg total) by mouth daily.   [DISCONTINUED] sertraline (ZOLOFT) 100 MG tablet Take 1.5 tablets (150 mg total) by mouth daily.   No facility-administered encounter medications on file as of 01/10/2022.    Patient Active Problem List   Diagnosis Date Noted   Aortic atherosclerosis ([place]) 08/19/2020   Centrilobular emphysema ([place]) 08/20/2019   Anxiety 04/19/2018   Syncope 12/11/2017   Allergic rhinitis 09/28/2017   Advanced care planning/counseling discussion 04/14/2017   Vitamin D deficiency 02/07/2017   Osteoarthritis of hip (Bilateral) 11/29/2016   Chronic sacroiliac joint pain (Bilateral) 11/29/2016   Chronic pain syndrome 11/28/2016   Failed back surgical syndrome 11/28/2016   Chronic low back pain (Location of Primary Source of Pain) (Bilateral) (R>L) 11/28/2016   Chronic lower extremity pain (Bilateral) (R>L) 11/28/2016   Chronic neck pain (Location of Secondary source of pain) (Bilateral) (R>L) 11/28/2016   DDD (degenerative disc disease), lumbar 11/28/2016   Elevated TSH 07/08/2015   Acid reflux 11/19/2014   Depression, recurrent ([place])    Hyperlipidemia    Hypertension    Postlaminectomy syndrome, lumbar region 11/17/2014    Conditions to be addressed/monitored: HTN, Anxiety, Depression, and Osteoarthritis; Limited support system  Care Plan : LCSW Plan of Care  Updates made by Rebekah Chesterfield, LCSW since 01/11/2022 12:00 AM     Problem: Coping Skills (General Plan of Care)      Long-Range Goal: Coping Skills Enhanced Completed 01/10/2022  Start Date: 11/24/2021  Expected End Date: 02/26/2022  This Visit's  Progress: On track  Recent Progress: On track  Priority: High  Note:   Current Barriers:  Mental Health Concerns    CSW Clinical Goal(s):  Patient  will demonstrate a reduction in symptoms related to :Depression: anxiety  through collaboration with Clinical Social Worker, provider, and care team.   Interventions: Pt states, "everything's been going downhill" Pt reports she experienced 2 "blackout spells" from stress and anxiety noting this has been ongoing for years Patient endorses having no support system in place LCSW will collaborate with PCP about an internal referral to [facility] for med management and therapy. Pt is open to services LCSW assisted patient in identifying triggers to stressors. Healthy coping skills were discussed to assist with stress management and grounding strategies. Enjoys taking care of two dogs CCM LCSW reviewed upcoming appts 1:1 collaboration with primary care provider regarding development and update of comprehensive plan of care as evidenced by provider attestation and co-signature Inter-disciplinary care team collaboration (see longitudinal plan of care) Evaluation of current treatment plan related to  self management and patient's adherence to plan as established by provider Review resources, discussed options and provided patient information about  Therapy and  med management Strategies to strengthen support system Mindfulness or Relaxation training provided Active listening / Reflection utilized  Emotional Support Provided Provided psychoeducation for mental health needs  Verbalization of feelings encouraged  Crisis Resource Education / information provided     Task & activities to accomplish goals: Contact PCP office with any questions or concerns Attend scheduled provider appts Utilize healthy coping skills and/or supportive resources discussed        Follow Up Plan: [facility] will reach out to patient to schedule appointment for  continued CCM services      Christa See, MSW, [email or web address] Phone [phone] 12:36 PM

## 2022-01-17 ENCOUNTER — Ambulatory Visit: Payer: Medicare HMO | Admitting: Nurse Practitioner

## 2022-01-27 DIAGNOSIS — I10 Essential (primary) hypertension: Secondary | ICD-10-CM

## 2022-01-27 DIAGNOSIS — M160 Bilateral primary osteoarthritis of hip: Secondary | ICD-10-CM

## 2022-01-27 DIAGNOSIS — F339 Major depressive disorder, recurrent, unspecified: Secondary | ICD-10-CM

## 2022-01-28 ENCOUNTER — Telehealth: Payer: Self-pay

## 2022-01-28 NOTE — Chronic Care Management (AMB) (Signed)
  Care Coordination  Outreach Note  01/28/2022 Name: Caitlyn May MRN: [id number] DOB: 02-04-1956   Care Coordination Outreach Attempts  An unsuccessful telephone outreach was attempted today to offer the patient information about available care coordination services as a benefit of their health plan.   Follow Up Plan:  Additional outreach attempts will be made to offer the patient care coordination information and services.   Encounter Outcome:  No Answer  Sig Noreene Larsson, [place], [place] [address] Direct Dial: [phone] [email or web address]

## 2022-02-01 NOTE — Chronic Care Management (AMB) (Signed)
  Care Coordination  Outreach Note  02/01/2022 Name: Orie Cuttino MRN: [id number] DOB: 17-Dec-1955   Care Coordination Outreach Attempts  A second unsuccessful outreach was attempted today to offer the patient with information about available care coordination services as a benefit of their health plan.     Follow Up Plan:  Additional outreach attempts will be made to offer the patient care coordination information and services.   Encounter Outcome:  No Answer  Sig Noreene Larsson, [place], [place] [address] Direct Dial: [phone] [email or web address]

## 2022-02-02 ENCOUNTER — Ambulatory Visit: Payer: Self-pay

## 2022-02-02 NOTE — Patient Outreach (Signed)
  Care Coordination   02/02/2022 Name: Caitlyn May MRN: [id number] DOB: 11/30/1955   Care Coordination Outreach Attempts:  An unsuccessful telephone outreach was attempted for a scheduled appointment today.  Follow Up Plan:  Additional outreach attempts will be made to offer the patient care coordination information and services.   Encounter Outcome:  No Answer  Care Coordination Interventions Activated:  No   Care Coordination Interventions:  No, not indicated    Noreene Larsson RN, MSN, CCM Community Care Coordinator [facility] Mobile: [phone]

## 2022-02-09 ENCOUNTER — Institutional Professional Consult (permissible substitution): Payer: Medicare HMO | Admitting: Pulmonary Disease

## 2022-02-11 NOTE — Chronic Care Management (AMB) (Signed)
  Chronic Care Management Note  02/11/2022 Name: Caitlyn May MRN: [id number] DOB: 04-08-1956  Caitlyn May is a 66 y.o. year old female who is a primary care patient of Venita Lick, NP and is actively engaged with the care management team. I reached out to [facility] by phone today to assist with re-scheduling a follow up visit with the Licensed Clinical Social Worker  Follow up plan: Unable to make contact on outreach attempts x 3. PCP Venita Lick, NP notified via routed documentation in medical record.   Noreene Larsson, [place], [place] [address] Direct Dial: [phone] [email or web address]

## 2022-02-21 ENCOUNTER — Telehealth: Payer: Medicare HMO

## 2022-02-23 ENCOUNTER — Institutional Professional Consult (permissible substitution): Payer: Medicare HMO | Admitting: Student in an Organized Health Care Education/Training Program

## 2022-03-01 ENCOUNTER — Ambulatory Visit: Payer: Medicare HMO | Admitting: Student in an Organized Health Care Education/Training Program

## 2022-03-01 ENCOUNTER — Encounter: Payer: Self-pay | Admitting: Student in an Organized Health Care Education/Training Program

## 2022-03-01 VITALS — BP 122/74 | HR 83 | Temp 98.1°F | Ht 66.0 in | Wt 135.2 lb

## 2022-03-01 DIAGNOSIS — F17200 Nicotine dependence, unspecified, uncomplicated: Secondary | ICD-10-CM | POA: Diagnosis not present

## 2022-03-01 DIAGNOSIS — J432 Centrilobular emphysema: Secondary | ICD-10-CM | POA: Diagnosis not present

## 2022-03-01 MED ORDER — NICOTINE 14 MG/24HR TD PT24: 14.0000 mg | MEDICATED_PATCH | TRANSDERMAL | 0 refills | Status: AC

## 2022-03-01 MED ORDER — NICOTINE POLACRILEX 2 MG MT LOZG: 2.0000 mg | LOZENGE | OROMUCOSAL | 3 refills | Status: AC | PRN

## 2022-03-01 MED ORDER — NICOTINE 7 MG/24HR TD PT24: 7.0000 mg | MEDICATED_PATCH | TRANSDERMAL | 0 refills | Status: AC

## 2022-03-01 MED ORDER — NICOTINE 21 MG/24HR TD PT24: 21.0000 mg | MEDICATED_PATCH | TRANSDERMAL | 0 refills | Status: AC

## 2022-03-01 NOTE — Progress Notes (Signed)
Synopsis: Referred in for shortness of breath by Venita Lick, NP  Assessment & Plan:   #Centrilobular emphysema ([place]) #Shortness of breath  Review of her previous chest CT's (for lung cancer screening) is notable for significant emphysema, consistent with her long standing history of COPD. Today, we discussed that her symptoms are likely from emphysema and COPD, which I will confirm with pulmonary function testing. We did discuss that she could benefit from pulmonary rehabilitation and I will place a referral to that effect. She will discuss her stress incontinence with her PCP with consideration for a gynecology referral.  - Continue Trelegy one puff once daily - Continue Albuterol PRN - Pulmonary Function Test [facility] Only; Future - AMB referral to pulmonary rehabilitation  2. Nicotine dependence with current use  Long standing history of smoking. She is interested in attempting to quit. I will prescribe her nicotine lozenges and patches as cessation aid and provide with resources.  - nicotine polacrilex (NICOTINE MINI) 2 MG lozenge; Take 1 lozenge (2 mg total) by mouth every 2 (two) hours as needed for smoking cessation.  Dispense: 72 lozenge; Refill: 3 - nicotine (NICODERM CQ - DOSED IN MG/24 HOURS) 21 mg/24hr patch; Place 1 patch (21 mg total) onto the skin daily.  Dispense: 42 patch; Refill: 0 - nicotine (NICODERM CQ - DOSED IN MG/24 HOURS) 14 mg/24hr patch; Place 1 patch (14 mg total) onto the skin daily for 14 days.  Dispense: 14 patch; Refill: 0 - nicotine (NICODERM CQ - DOSED IN MG/24 HR) 7 mg/24hr patch; Place 1 patch (7 mg total) onto the skin daily for 14 days.  Dispense: 14 patch; Refill: 0   Return in about 3 months (around 06/01/2022).  I spent 60 minutes caring for this patient today, including preparing to see the patient, obtaining and/or reviewing separately obtained history, performing a medically appropriate examination and/or evaluation, counseling and educating  the patient/family/caregiver, ordering medications, tests, or procedures, documenting clinical information in the electronic health record, and independently interpreting results (not separately reported/billed) and communicating results to the patient/family/caregiver  Armando Reichert, MD [facility] 03/01/2022 12:57 PM    End of visit medications:  Meds ordered this encounter  Medications   nicotine polacrilex (NICOTINE MINI) 2 MG lozenge    Sig: Take 1 lozenge (2 mg total) by mouth every 2 (two) hours as needed for smoking cessation.    Dispense:  72 lozenge    Refill:  3   nicotine (NICODERM CQ - DOSED IN MG/24 HOURS) 21 mg/24hr patch    Sig: Place 1 patch (21 mg total) onto the skin daily.    Dispense:  42 patch    Refill:  0   nicotine (NICODERM CQ - DOSED IN MG/24 HOURS) 14 mg/24hr patch    Sig: Place 1 patch (14 mg total) onto the skin daily for 14 days.    Dispense:  14 patch    Refill:  0   nicotine (NICODERM CQ - DOSED IN MG/24 HR) 7 mg/24hr patch    Sig: Place 1 patch (7 mg total) onto the skin daily for 14 days.    Dispense:  14 patch    Refill:  0     Current Outpatient Medications:    albuterol (VENTOLIN HFA) 108 (90 Base) MCG/ACT inhaler, Inhale 2 puffs into the lungs every 6 (six) hours as needed for wheezing or shortness of breath., Disp: 18 g, Rfl: 1   amLODipine (NORVASC) 5 MG tablet, Take 1 tablet (  5 mg total) by mouth daily., Disp: 180 tablet, Rfl: 4   buPROPion (WELLBUTRIN XL) 150 MG 24 hr tablet, Take 1 tablet (150 mg total) by mouth daily., Disp: 90 tablet, Rfl: 4   carvedilol (COREG) 12.5 MG tablet, Take 1 tablet (12.5 mg total) by mouth 2 (two) times daily., Disp: 180 tablet, Rfl: 4   Fluticasone-Umeclidin-Vilant (TRELEGY ELLIPTA) 100-62.5-25 MCG/ACT AEPB, Inhale 1 puff into the lungs daily., Disp: 1 each, Rfl: 11   gabapentin (NEURONTIN) 300 MG capsule, Take 1 capsule (300 mg total) by mouth 3 (three) times daily., Disp: 270 capsule,  Rfl: 4   hydrOXYzine (VISTARIL) 25 MG capsule, Take 1 capsule (25 mg total) by mouth every 8 (eight) hours as needed., Disp: 20 capsule, Rfl: 0   megestrol (MEGACE) 40 MG tablet, Take 1 tablet (40 mg total) by mouth 4 (four) times daily as needed., Disp: 120 tablet, Rfl: 4   montelukast (SINGULAIR) 10 MG tablet, Take 1 tablet (10 mg total) by mouth at bedtime., Disp: 90 tablet, Rfl: 4   nicotine (NICODERM CQ - DOSED IN MG/24 HOURS) 14 mg/24hr patch, Place 1 patch (14 mg total) onto the skin daily for 14 days., Disp: 14 patch, Rfl: 0   nicotine (NICODERM CQ - DOSED IN MG/24 HOURS) 21 mg/24hr patch, Place 1 patch (21 mg total) onto the skin daily., Disp: 42 patch, Rfl: 0   nicotine (NICODERM CQ - DOSED IN MG/24 HR) 7 mg/24hr patch, Place 1 patch (7 mg total) onto the skin daily for 14 days., Disp: 14 patch, Rfl: 0   nicotine polacrilex (NICOTINE MINI) 2 MG lozenge, Take 1 lozenge (2 mg total) by mouth every 2 (two) hours as needed for smoking cessation., Disp: 72 lozenge, Rfl: 3   olmesartan (BENICAR) 40 MG tablet, Take 1 tablet (40 mg total) by mouth daily., Disp: 90 tablet, Rfl: 4   ondansetron (ZOFRAN ODT) 8 MG disintegrating tablet, Take 1 tablet (8 mg total) by mouth every 8 (eight) hours as needed for nausea or vomiting., Disp: 30 tablet, Rfl: 0   pantoprazole (PROTONIX) 40 MG tablet, Take 1 tablet (40 mg total) by mouth at bedtime., Disp: 90 tablet, Rfl: 4   rosuvastatin (CRESTOR) 40 MG tablet, Take 1 tablet (40 mg total) by mouth daily., Disp: 90 tablet, Rfl: 4   sertraline (ZOLOFT) 100 MG tablet, Take 1.5 tablets (150 mg total) by mouth daily., Disp: 135 tablet, Rfl: 4   tiZANidine (ZANAFLEX) 4 MG tablet, Take 1 tablet (4 mg total) by mouth every 6 (six) hours as needed for muscle spasms., Disp: 90 tablet, Rfl: 2   Subjective:   PATIENT ID: Caitlyn May GENDER: female DOB: 20-Nov-1955, MRN: [id number]  Chief Complaint  Patient presents with   pulmonary consult    COPD/asthma. SOB with  exertion.     HPI  Caitlyn May is a pleasant 66 year old female presenting to clinic for the evaluation of shortness of breath and COPD.  She reports that her symptoms have been present for quite a while and she had been followed by her PCP and prescribed Trelegy. She feels the Trelegy has been helpful, but she continues to have exertional dyspnea. She is short of breath on walking 10-20 feet, and at times has episodes where she is completely out of breath, leading to stress incontinence. This has happened twice and has been very distressing to her. She did not have her rescue inhaler on hand at the time and feels that might have contributed to making  the episodes worse. In addition to the shortness of breath, she also experiences chest tightness, episodes of wheezing, and a cough (mostly in the morning). The cough is productive of whitish sputum. She denies any fevers, chills, chest pain, night sweats, or weight loss. She had previously experienced significant weight loss but has since been able to gain some of it.  She used to previously work [employer]. She reports using appropriate PPE with her occupation. She is a lifelong smoker, starting at the age of 1. She smoked 2 packs a day for a very long time but has recently cut down to 1 pack per day. She used to smoke marijuana but stopped around 3 years ago. She is interested in tobacco cessation.  Ancillary information including prior medications, full medical/surgical/family/social histories, and PFTs (when available) are listed below and have been reviewed.   Review of Systems  Constitutional:  Negative for chills, fever and malaise/fatigue.  Respiratory:  Positive for cough, shortness of breath and wheezing. Negative for hemoptysis.   Cardiovascular:  Negative for chest pain, palpitations and leg swelling.  Gastrointestinal:  Negative for abdominal pain.  Genitourinary:        Stress incontinence  Skin:  Negative for rash.      Objective:   Vitals:   03/01/22 1014  BP: 122/74  Pulse: 83  Temp: 98.1 F (36.7 C)  TempSrc: Temporal  SpO2: 99%  Weight: 135 lb 3.2 oz (61.3 kg)  Height: '5\' 6"'$  (1.676 m)   99% on RA  BMI Readings from Last 3 Encounters:  03/01/22 21.82 kg/m  11/29/21 20.47 kg/m  10/20/21 20.43 kg/m   Wt Readings from Last 3 Encounters:  03/01/22 135 lb 3.2 oz (61.3 kg)  11/29/21 126 lb 12.8 oz (57.5 kg)  10/20/21 126 lb 9.6 oz (57.4 kg)    Physical Exam Constitutional:      General: She is not in acute distress.    Appearance: Normal appearance.  HENT:     Head: Normocephalic.     Nose: Nose normal.     Mouth/Throat:     Mouth: Mucous membranes are moist.  Eyes:     Extraocular Movements: Extraocular movements intact.  Cardiovascular:     Rate and Rhythm: Normal rate and regular rhythm.     Pulses: Normal pulses.     Heart sounds: Normal heart sounds.  Pulmonary:     Effort: Pulmonary effort is normal.     Breath sounds: No wheezing or rhonchi.     Comments: Distant breath sounds bilaterally.  Abdominal:     General: Abdomen is flat.     Palpations: Abdomen is soft.  Musculoskeletal:        General: Normal range of motion.     Cervical back: Normal range of motion and neck supple.     Comments: Mild clubbing noted bilaterally  Skin:    General: Skin is warm.  Neurological:     General: No focal deficit present.     Mental Status: She is alert and oriented to person, place, and time. Mental status is at baseline.       Ancillary Information    Past Medical History:  Diagnosis Date   Acute rhinosinusitis 07/23/2019   Chronic kidney disease    Depression    Failed back syndrome, lumbar    Hyperlipidemia    Hypertension    Osteoporosis    Weight loss      Family History  Problem Relation Age of Onset  Hypertension Mother    Dementia Mother    Cancer Father    Cancer Maternal Aunt        breast   Diabetes Neg Hx    Heart disease Neg Hx     Stroke Neg Hx    COPD Neg Hx      Past Surgical History:  Procedure Laterality Date   ABDOMINAL HYSTERECTOMY     CESAREAN SECTION     MOUTH SURGERY     SPINE SURGERY      Social History   Socioeconomic History   Marital status: Divorced    Spouse name: Not on file   Number of children: Not on file   Years of education: 10   Highest education level: 10th grade  Occupational History   Occupation: disability  Tobacco Use   Smoking status: Every Day    Packs/day: 2.00    Types: Cigarettes   Smokeless tobacco: Never   Tobacco comments:    1PPD 03/01/2022  Vaping Use   Vaping Use: Never used  Substance and Sexual Activity   Alcohol use: No   Drug use: Not Currently    Types: Marijuana    Comment: last week estimated 05/10/2019   Sexual activity: Not on file  Other Topics Concern   Not on file  Social History Narrative   Not on file   Social Determinants of Health   Financial Resource Strain: High Risk (12/20/2021)   Overall Financial Resource Strain (CARDIA)    Difficulty of Paying Living Expenses: Hard  Food Insecurity: No Food Insecurity (12/15/2021)   Hunger Vital Sign    Worried About Running Out of Food in the Last Year: Never true    Ran Out of Food in the Last Year: Never true  Transportation Needs: No Transportation Needs (06/22/2021)   PRAPARE - Hydrologist (Medical): No    Lack of Transportation (Non-Medical): No  Physical Activity: Inactive (12/20/2021)   Exercise Vital Sign    Days of Exercise per Week: 0 days    Minutes of Exercise per Session: 0 min  Stress: Stress Concern Present (06/22/2021)   Bent    Feeling of Stress : To some extent  Social Connections: Socially Isolated (11/03/2021)   Social Connection and Isolation Panel [NHANES]    Frequency of Communication with Friends and Family: More than three times a week    Frequency of Social Gatherings  with Friends and Family: More than three times a week    Attends Religious Services: Never    Marine scientist or Organizations: No    Attends Archivist Meetings: Never    Marital Status: Divorced  Human resources officer Violence: Not At Risk (06/22/2021)   Humiliation, Afraid, Rape, and Kick questionnaire    Fear of Current or Ex-Partner: No    Emotionally Abused: No    Physically Abused: No    Sexually Abused: No     Allergies  Allergen Reactions   Accupril [Quinapril Hcl]    Avelox [Moxifloxacin]    Ciprofloxacin    Hctz [Hydrochlorothiazide] Other (See Comments)    Leg swelling   Penicillins    Sulfa Antibiotics      CBC    Component Value Date/Time   WBC 9.4 06/30/2021 1332   WBC 7.6 06/25/2018 1623   RBC 4.43 06/30/2021 1332   RBC 4.55 06/25/2018 1623   HGB 14.3 06/30/2021 1332   HCT 42.2  06/30/2021 1332   PLT 233 06/30/2021 1332   MCV 95 06/30/2021 1332   MCH 32.3 06/30/2021 1332   MCH 32.3 06/25/2018 1623   MCHC 33.9 06/30/2021 1332   MCHC 33.0 06/25/2018 1623   RDW 12.1 06/30/2021 1332   LYMPHSABS 2.2 06/30/2021 1332   EOSABS 0.1 06/30/2021 1332   BASOSABS 0.1 06/30/2021 1332    Pulmonary Functions Testing Results:     No data to display          Outpatient Medications Prior to Visit  Medication Sig Dispense Refill   albuterol (VENTOLIN HFA) 108 (90 Base) MCG/ACT inhaler Inhale 2 puffs into the lungs every 6 (six) hours as needed for wheezing or shortness of breath. 18 g 1   amLODipine (NORVASC) 5 MG tablet Take 1 tablet (5 mg total) by mouth daily. 180 tablet 4   buPROPion (WELLBUTRIN XL) 150 MG 24 hr tablet Take 1 tablet (150 mg total) by mouth daily. 90 tablet 4   carvedilol (COREG) 12.5 MG tablet Take 1 tablet (12.5 mg total) by mouth 2 (two) times daily. 180 tablet 4   Fluticasone-Umeclidin-Vilant (TRELEGY ELLIPTA) 100-62.5-25 MCG/ACT AEPB Inhale 1 puff into the lungs daily. 1 each 11   gabapentin (NEURONTIN) 300 MG capsule Take 1  capsule (300 mg total) by mouth 3 (three) times daily. 270 capsule 4   hydrOXYzine (VISTARIL) 25 MG capsule Take 1 capsule (25 mg total) by mouth every 8 (eight) hours as needed. 20 capsule 0   megestrol (MEGACE) 40 MG tablet Take 1 tablet (40 mg total) by mouth 4 (four) times daily as needed. 120 tablet 4   montelukast (SINGULAIR) 10 MG tablet Take 1 tablet (10 mg total) by mouth at bedtime. 90 tablet 4   olmesartan (BENICAR) 40 MG tablet Take 1 tablet (40 mg total) by mouth daily. 90 tablet 4   ondansetron (ZOFRAN ODT) 8 MG disintegrating tablet Take 1 tablet (8 mg total) by mouth every 8 (eight) hours as needed for nausea or vomiting. 30 tablet 0   pantoprazole (PROTONIX) 40 MG tablet Take 1 tablet (40 mg total) by mouth at bedtime. 90 tablet 4   rosuvastatin (CRESTOR) 40 MG tablet Take 1 tablet (40 mg total) by mouth daily. 90 tablet 4   sertraline (ZOLOFT) 100 MG tablet Take 1.5 tablets (150 mg total) by mouth daily. 135 tablet 4   tiZANidine (ZANAFLEX) 4 MG tablet Take 1 tablet (4 mg total) by mouth every 6 (six) hours as needed for muscle spasms. 90 tablet 2   No facility-administered medications prior to visit.

## 2022-03-01 NOTE — Patient Instructions (Signed)
[facility] Quitline: Call 1-800-QUIT-NOW (904) 516-7257). The [place] Quitline is a free service for [employer]. Trained counselors are available from 8 am until 3 am, 365 days per year. Services are available in both [place] and [place].   Web Resources Free online support programs can help you track your progress and share experiences with others who are quitting. These are examples: www.becomeanex.org www.trytostop.org  www.smokefree.gov  www.[employer].com.br.aspx  Tobacco Cessation Medications  Nicotine Replacement Therapy (NRT)  Nicotine is the addictive part of tobacco smoke, but not the most dangerous part. There are 7000 other toxins in cigarettes, including carbon monoxide, that cause disease. People do not generally become addicted to medication. Common problems: People don't use enough medication or stop too early. Medications are safe and effective. Overdose is very uncommon. Use medications as long as needed (3 months minimum). Some combinations work better than single medications. Long acting medications like the NRT patch and bupropion provide continuous treatment for withdrawal symptoms.  PLUS  Short acting medications like the NRT gum, lozenge, inhaler, and nasal spray help people to cope with breakthrough cravings.  ? Nicotine Patch  Place patch on hairless skin on upper body, including arms and back. Each day: discard old patch, shower, apply new patch to a different site. Apply hydrocortisone cream to mildly red/irritated areas. Call provider if rash develops. If patch causes sleep disturbance, remove patch at bedtime and replace each morning after shower. Side effects may include: skin irritation, headache, insomnia, abnormal/vivid dreams.  ? Nicotine Lozenge  Allow to dissolve slowly in mouth (20-30 minutes). Do not chew or swallow. Nicotine release may cause a warm tingling sensation. Occasionally rotate to different  areas of the mouth. Use enough to control cravings, up to 20 lozenges per day (if used alone). Avoid eating or drinking for 15 minutes before using and during use. Side effects may include: nausea, hiccups, cough, heartburn, headache, gas, insomnia.  ? Wellbutrin / Zyban (bupropion) Take one 150 mg pill each morning for 3 days, one week before target quit date. On Day 4, increase to one 150 mg pill twice a day, morning and evening.  Maintain this dose for a minimum of 3 months. Be sure that the two doses are at least 8 hours apart, but try to take second dose early in the evening (i.e. 6 pm) to avoid sleep problems. Avoid or minimize use of alcohol when taking this medication. Common side effects include: dry mouth, headache, insomnia, nausea, weight loss.  Risk of seizure is 05/998. STOP taking BUPROPION and contact a healthcare provider immediately if you experience agitation, hostility, depressed mood, changes in thoughts or behavior that are not typical for you, thinking about or attempting suicide, allergic or skin reactions including swelling, rash, redness, or peeling of the skin.

## 2022-03-03 ENCOUNTER — Ambulatory Visit: Payer: Medicare HMO

## 2022-03-15 ENCOUNTER — Other Ambulatory Visit: Payer: Self-pay | Admitting: Nurse Practitioner

## 2022-03-15 ENCOUNTER — Ambulatory Visit: Payer: Self-pay

## 2022-03-15 NOTE — Telephone Encounter (Signed)
Requested medication (s) are due for refill today: yes  Requested medication (s) are on the active medication list: yes    Last refill: 10/20/21  #90  2 refills  Future visit scheduled yes 03/16/22  Notes to clinic:Not delegated, please review. Thank you.  Requested Prescriptions  Pending Prescriptions Disp Refills   tiZANidine (ZANAFLEX) 4 MG tablet [Pharmacy Med Name: TIZANIDINE '4MG'$  TABLETS] 90 tablet 2    Sig: TAKE 1 TABLET(4 MG) BY MOUTH EVERY 6 HOURS AS NEEDED FOR MUSCLE SPASMS     Not Delegated - Cardiovascular:  Alpha-2 Agonists - tizanidine Failed - 03/15/2022  4:50 PM      Failed - This refill cannot be delegated      Passed - Valid encounter within last 6 months    Recent Outpatient Visits           3 months ago Centrilobular emphysema ([place])   [place], Henrine Screws T, NP   4 months ago [place], MD   4 months ago Centrilobular emphysema ([place])   [place], [place] T, NP   8 months ago Diarrhea, unspecified type   [place], Henrine Screws T, NP   10 months ago Centrilobular emphysema ([place])   [place], Barbaraann Faster, NP       Future Appointments             Tomorrow Venita Lick, NP [place], [facility]

## 2022-03-15 NOTE — Telephone Encounter (Signed)
  Chief Complaint: nasal congestion Symptoms: nasal congestion, cough, productive green mucus, body aches, sore throat, SOB at times  Frequency: since Sunday  Pertinent Negatives: NA Disposition: '[]'$ ED /'[]'$ Urgent Care (no appt availability in office) / '[x]'$ Appointment(In office/virtual)/ '[]'$  [place] [facility]/ '[]'$ Home Care/ '[]'$ Refused Recommended Disposition /'[]'$ [place] [facility]/ '[]'$  Follow-up with PCP Additional Notes: pt is taking Tylenol but doesn't have any Mucinex at home. Wanted appt for tomorrow evening. Agent scheduled appt at 1620 with Henrine Screws, NP.   Reason for Disposition  [1] Fever returns after gone for over 24 hours AND [2] symptoms worse or not improved  Answer Assessment - Initial Assessment Questions 1. ONSET: "When did the nasal discharge start?"      Sunday 3. COUGH: "Do you have a cough?" If Yes, ask: "Describe the color of your sputum" (clear, white, yellow, green)     Yes, green mucus  4. RESPIRATORY DISTRESS: "Describe your breathing."      SOB at times 5. FEVER: "Do you have a fever?" If Yes, ask: "What is your temperature, how was it measured, and when did it start?"     Low grade  7. OTHER SYMPTOMS: "Do you have any other symptoms?" (e.g., sore throat, earache, wheezing, vomiting)     Sore throat, earache  Protocols used: Common Cold-A-AH

## 2022-03-16 ENCOUNTER — Ambulatory Visit: Payer: Medicare HMO | Admitting: Nurse Practitioner

## 2022-03-20 NOTE — Patient Instructions (Signed)

## 2022-03-21 ENCOUNTER — Telehealth (INDEPENDENT_AMBULATORY_CARE_PROVIDER_SITE_OTHER): Payer: Medicare HMO | Admitting: Nurse Practitioner

## 2022-03-21 ENCOUNTER — Encounter: Payer: Self-pay | Admitting: Nurse Practitioner

## 2022-03-21 DIAGNOSIS — I10 Essential (primary) hypertension: Secondary | ICD-10-CM | POA: Diagnosis not present

## 2022-03-21 DIAGNOSIS — E785 Hyperlipidemia, unspecified: Secondary | ICD-10-CM | POA: Diagnosis not present

## 2022-03-21 DIAGNOSIS — E782 Mixed hyperlipidemia: Secondary | ICD-10-CM

## 2022-03-21 DIAGNOSIS — R7989 Other specified abnormal findings of blood chemistry: Secondary | ICD-10-CM | POA: Diagnosis not present

## 2022-03-21 DIAGNOSIS — F339 Major depressive disorder, recurrent, unspecified: Secondary | ICD-10-CM | POA: Diagnosis not present

## 2022-03-21 DIAGNOSIS — N393 Stress incontinence (female) (male): Secondary | ICD-10-CM

## 2022-03-21 DIAGNOSIS — J441 Chronic obstructive pulmonary disease with (acute) exacerbation: Secondary | ICD-10-CM

## 2022-03-21 DIAGNOSIS — E559 Vitamin D deficiency, unspecified: Secondary | ICD-10-CM

## 2022-03-21 DIAGNOSIS — I700 Atherosclerosis of aorta: Secondary | ICD-10-CM

## 2022-03-21 DIAGNOSIS — F419 Anxiety disorder, unspecified: Secondary | ICD-10-CM

## 2022-03-21 DIAGNOSIS — J432 Centrilobular emphysema: Secondary | ICD-10-CM

## 2022-03-21 MED ORDER — HYDROCOD POLI-CHLORPHE POLI ER 10-8 MG/5ML PO SUER: 5.0000 mL | Freq: Every evening | ORAL | 0 refills | Status: DC | PRN

## 2022-03-21 MED ORDER — PREDNISONE 20 MG PO TABS: 40.0000 mg | ORAL_TABLET | Freq: Every day | ORAL | 0 refills | Status: DC

## 2022-03-21 MED ORDER — AZITHROMYCIN 250 MG PO TABS: ORAL_TABLET | ORAL | 0 refills | Status: AC

## 2022-03-21 NOTE — Assessment & Plan Note (Signed)
Chronic, ongoing.  Recheck outpatient and continue supplement.

## 2022-03-21 NOTE — Assessment & Plan Note (Signed)
Chronic, stable. Denies SI/HI.  To be followed by CCM team.  Continue Wellbutrin and Zoloft which are offering her benefit.  Return in 3 months.

## 2022-03-21 NOTE — Assessment & Plan Note (Addendum)
Chronic, with current acute exacerbation. Continue Trelegy and Albuterol, Trelegy offering benefit, but is costly -- will work with CCM PharmD on this.  Pulmonary referral in place. ?need for O2 with activity.  Continue annual lung screening, has missed some years due to cost at this time - recommend she schedule.  Recommend complete cessation of smoking.  Scripts for Zpack, Prednisone, and Tussionex sent in -- to return if worsening symptoms.

## 2022-03-21 NOTE — Assessment & Plan Note (Signed)
Chronic, ongoing.  Continue Rosuvastatin 40 MG daily, improved leg cramps.  Recommend taking Magnesium at night for leg cramps and sleep.  Labs outpatient

## 2022-03-21 NOTE — Assessment & Plan Note (Signed)
Chronic, stable.  BP at goal recent office visits.  Recommend she monitor BP at least a few mornings a week at home and document.  DASH diet at home.  Due to her COPD continue Olmesartan to allow for ongoing kidney protection, but less affect on COPD.  Labs outpatient.  Continue collaboration with cardiology as needed.  Recommend complete cessation of smoking.

## 2022-03-21 NOTE — Assessment & Plan Note (Signed)
Refer to depression plan of care for further. 

## 2022-03-21 NOTE — Assessment & Plan Note (Signed)
[place] labs outpatient.

## 2022-03-21 NOTE — Progress Notes (Signed)
There were no vitals taken for this visit.   Subjective:    Patient ID: Caitlyn May, female    DOB: January 12, 1956, 66 y.o.   MRN: [id number]  HPI: Caitlyn May is a 66 y.o. female  Chief Complaint  Patient presents with   URI    Pt states she has been having a cough (productive with green/yellow mucus), congestion, body aches, chills, fatigue, and headache. States she has tried using Mucinex and Tylenol   This visit was completed via telephone due to the restrictions of the COVID-19 pandemic. All issues as above were discussed and addressed but no physical exam was performed. If it was felt that the patient should be evaluated in the office, they were directed there. The patient verbally consented to this visit. Patient was unable to complete an audio/visual visit due to Technical difficulties", "Lack of internet. Due to the catastrophic nature of the COVID-19 pandemic, this visit was done through audio contact only. Location of the patient: home Location of the provider: work Those involved with this call:  Provider: Marnee Guarneri, DNP CMA: Frazier Butt, CMA Front Desk/Registration: [employer]  Time spent on call:  21 minutes on the phone discussing health concerns. 15 minutes total spent in review of patient's record and preparation of their chart.  I verified patient identity using two factors (patient name and date of birth). Patient consents verbally to being seen via telemedicine visit today.    HYPERTENSION / HYPERLIPIDEMIA Taking Olmesartan, Carvedilol, Amlodipine, and Rosuvastatin.  Lipitor caused leg cramps.  Satisfied with current treatment? yes Duration of hypertension: chronic BP monitoring frequency: a few times a day BP range: 110-190/70-90 BP medication side effects: no Duration of hyperlipidemia: chronic Cholesterol medication side effects: no Cholesterol supplements: none Medication compliance: good compliance Aspirin: no Recent stressors:  no Recurrent headaches: no Visual changes: no Palpitations: no Dyspnea: yes at baseline Chest pain: no Lower extremity edema: no Dizzy/lightheaded: no   COPD Taking Trelegy and Albuterol daily.  Had last CT on 08/15/2019 which noted aortic atherosclerosis and emphysema, was to have repeat ibut could not afford OOP cost of >$300.  Started smoking at age 32-14 years.  Currently is cutting back, was a 2 PPD smoker and now is at 1 PPD.    Currently with acute cough, started 2 weeks ago and started to be productive 1 week ago -- green/yellow.  One day last week on right side of face had a lot of pressure and blew out a bunch of yellow stuff.  Did Covid test at home when started, negative.  No fevers.   COPD status: stable Satisfied with current treatment?: yes Oxygen use: no Dyspnea frequency: at baseline Cough frequency: at baseline Rescue inhaler frequency:  2-3 times a day Limitation of activity: no Productive cough: none Last Spirometry: unknown Pneumovax: Up to Date Influenza: Up to Date   DEPRESSION Taking Zoloft and Wellbutrin daily for mood.  She is going through a rough time right now as has no family locally, has two dogs -- one is doing poorly. Mood status: stable Satisfied with current treatment?: yes Symptom severity: moderate  Duration of current treatment : chronic Side effects: no Medication compliance: good compliance Psychotherapy/counseling: yes in the past Previous psychiatric medications: Xanax Depressed mood: yes Anxious mood: yes Anhedonia: no Significant weight loss or gain: no Insomnia: yes hard to stay asleep Fatigue: yes Feelings of worthlessness or guilt: yes Impaired concentration/indecisiveness: no Suicidal ideations: no Hopelessness: at times Crying spells: no  03/21/2022    3:57 PM 11/29/2021    3:42 PM 11/03/2021    2:01 PM 10/20/2021   11:18 AM 06/22/2021    9:14 AM  Depression screen PHQ 2/9  Decreased Interest '1 2 2 2 1  ' Down,  Depressed, Hopeless '1 2 2 1 1  ' PHQ - 2 Score '2 4 4 3 2  ' Altered sleeping 0 '2 2 1 2  ' Tired, decreased energy 0 '3 2 3 2  ' Change in appetite 1 1 0 1 2  Feeling bad or failure about yourself  0 '1 1 2 ' 0  Trouble concentrating 0 '1 1 1 ' 0  Moving slowly or fidgety/restless 0 0 0 1 0  Suicidal thoughts 0 0 0 0 0  PHQ-9 Score '3 12 10 12 8  ' Difficult doing work/chores Not difficult at all Somewhat difficult Somewhat difficult  Somewhat difficult       03/21/2022    3:58 PM 11/29/2021    3:42 PM 11/03/2021    2:03 PM 10/20/2021   11:18 AM  GAD 7 : Generalized Anxiety Score  Nervous, Anxious, on Edge '1 2 1 3  ' Control/stop worrying '1 3 2 3  ' Worry too much - different things '1 3 3 3  ' Trouble relaxing 0 '3 3 3  ' Restless 0 '3 2 3  ' Easily annoyed or irritable '1 3 1 2  ' Afraid - awful might happen '1 1 1 1  ' Total GAD 7 Score '5 18 13 18  ' Anxiety Difficulty Somewhat difficult Very difficult Somewhat difficult Very difficult     Relevant past medical, surgical, family and social history reviewed and updated as indicated. Interim medical history since our last visit reviewed. Allergies and medications reviewed and updated.  Review of Systems  Constitutional:  Positive for chills and fatigue. Negative for activity change, appetite change, diaphoresis and fever.  HENT:  Positive for postnasal drip, rhinorrhea, sinus pressure and sore throat. Negative for congestion, ear discharge, ear pain and sinus pain.   Respiratory:  Positive for cough and wheezing. Negative for chest tightness and shortness of breath.   Cardiovascular:  Negative for chest pain, palpitations and leg swelling.  Gastrointestinal: Negative.   Endocrine: Negative.   Neurological: Negative.   Psychiatric/Behavioral:  Positive for sleep disturbance. Negative for decreased concentration, self-injury and suicidal ideas. The patient is not nervous/anxious.    Per HPI unless specifically indicated above     Objective:    There were no vitals  taken for this visit.  Wt Readings from Last 3 Encounters:  03/01/22 135 lb 3.2 oz (61.3 kg)  11/29/21 126 lb 12.8 oz (57.5 kg)  10/20/21 126 lb 9.6 oz (57.4 kg)    Physical Exam  Unable to perform due to telephone visit only.  Results for orders placed or performed in visit on 06/30/21  CBC with Differential/Platelet  Result Value Ref Range   WBC 9.4 3.4 - 10.8 x10E3/uL   RBC 4.43 3.77 - 5.28 x10E6/uL   Hemoglobin 14.3 11.1 - 15.9 g/dL   Hematocrit 42.2 34.0 - 46.6 %   MCV 95 79 - 97 fL   MCH 32.3 26.6 - 33.0 pg   MCHC 33.9 31.5 - 35.7 g/dL   RDW 12.1 11.7 - 15.4 %   Platelets 233 150 - 450 x10E3/uL   Neutrophils 67 Not Estab. %   Lymphs 23 Not Estab. %   Monocytes 8 Not Estab. %   Eos 1 Not Estab. %   Basos 1 Not Estab. %  Neutrophils Absolute 6.3 1.4 - 7.0 x10E3/uL   Lymphocytes Absolute 2.2 0.7 - 3.1 x10E3/uL   Monocytes Absolute 0.8 0.1 - 0.9 x10E3/uL   EOS (ABSOLUTE) 0.1 0.0 - 0.4 x10E3/uL   Basophils Absolute 0.1 0.0 - 0.2 x10E3/uL   Immature Granulocytes 0 Not Estab. %   Immature Grans (Abs) 0.0 0.0 - 0.1 x10E3/uL  Comprehensive metabolic panel  Result Value Ref Range   Glucose 84 70 - 99 mg/dL   BUN 15 8 - 27 mg/dL   Creatinine, Ser 0.91 0.57 - 1.00 mg/dL   eGFR 70 >59 mL/min/1.73   BUN/Creatinine Ratio 16 12 - 28   Sodium 143 134 - 144 mmol/L   Potassium 3.4 (L) 3.5 - 5.2 mmol/L   Chloride 105 96 - 106 mmol/L   CO2 22 20 - 29 mmol/L   Calcium 9.1 8.7 - 10.3 mg/dL   Total Protein 6.4 6.0 - 8.5 g/dL   Albumin 4.2 3.8 - 4.8 g/dL   Globulin, Total 2.2 1.5 - 4.5 g/dL   Albumin/Globulin Ratio 1.9 1.2 - 2.2   Bilirubin Total <0.2 0.0 - 1.2 mg/dL   Alkaline Phosphatase 76 44 - 121 IU/L   AST 16 0 - 40 IU/L   ALT 15 0 - 32 IU/L  T4, free  Result Value Ref Range   Free T4 1.17 0.82 - 1.77 ng/dL  TSH  Result Value Ref Range   TSH 2.680 0.450 - 4.500 uIU/mL      Assessment & Plan:   Problem List Items Addressed This Visit       Cardiovascular and  Mediastinum   Hypertension    Chronic, stable.  BP at goal recent office visits.  Recommend she monitor BP at least a few mornings a week at home and document.  DASH diet at home.  Due to her COPD continue Olmesartan to allow for ongoing kidney protection, but less affect on COPD.  Labs outpatient.  Continue collaboration with cardiology as needed.  Recommend complete cessation of smoking.       Relevant Orders   Comprehensive metabolic panel     Respiratory   Centrilobular emphysema ([place]) - Primary    Chronic, with current acute exacerbation. Continue Trelegy and Albuterol, Trelegy offering benefit, but is costly -- will work with CCM PharmD on this.  Pulmonary referral in place. ?need for O2 with activity.  Continue annual lung screening, has missed some years due to cost at this time - recommend she schedule.  Recommend complete cessation of smoking.  Scripts for Zpack, Prednisone, and Tussionex sent in -- to return if worsening symptoms.      Relevant Medications   azithromycin (ZITHROMAX) 250 MG tablet   predniSONE (DELTASONE) 20 MG tablet   chlorpheniramine-HYDROcodone (TUSSIONEX) 10-8 MG/5ML     Other   Anxiety    Refer to depression plan of care for further.      Depression, recurrent (HCC)    Chronic, stable. Denies SI/HI.  To be followed by CCM team.  Continue Wellbutrin and Zoloft which are offering her benefit.  Return in 3 months.      Elevated TSH    Recheck labs outpatient.      Relevant Orders   TSH   T4, free   Hyperlipidemia    Chronic, ongoing.  Continue Rosuvastatin 40 MG daily, improved leg cramps.  Recommend taking Magnesium at night for leg cramps and sleep.  Labs outpatient       Relevant Orders   Comprehensive metabolic  panel   Lipid Panel w/o Chol/HDL Ratio   Vitamin D deficiency    Chronic, ongoing.  Recheck outpatient and continue supplement.      Relevant Orders   VITAMIN D 25 Hydroxy (Vit-D Deficiency, Fractures)    I discussed the  assessment and treatment plan with the patient. The patient was provided an opportunity to ask questions and all were answered. The patient agreed with the plan and demonstrated an understanding of the instructions.   The patient was advised to call back or seek an in-person evaluation if the symptoms worsen or if the condition fails to improve as anticipated.   I provided 21+ minutes of time during this encounter.    Follow up plan: Return in about 3 months (around 06/21/2022) for COPD, HTN/HLD, MOOD.

## 2022-03-24 ENCOUNTER — Ambulatory Visit: Payer: Medicare HMO | Attending: Student in an Organized Health Care Education/Training Program

## 2022-03-25 ENCOUNTER — Other Ambulatory Visit: Payer: Self-pay

## 2022-03-25 MED ORDER — HYDROXYZINE PAMOATE 25 MG PO CAPS: 25.0000 mg | ORAL_CAPSULE | Freq: Three times a day (TID) | ORAL | 1 refills | Status: DC | PRN

## 2022-03-25 MED ORDER — TIZANIDINE HCL 4 MG PO TABS: ORAL_TABLET | ORAL | 3 refills | Status: DC

## 2022-03-25 NOTE — Telephone Encounter (Signed)
Patient needs medications sent to [facility] so they can be delivered to the patient next week.

## 2022-04-01 ENCOUNTER — Other Ambulatory Visit: Payer: Medicare HMO

## 2022-04-01 ENCOUNTER — Ambulatory Visit: Payer: Medicare HMO

## 2022-04-06 ENCOUNTER — Other Ambulatory Visit: Payer: Medicare HMO

## 2022-04-06 ENCOUNTER — Ambulatory Visit (INDEPENDENT_AMBULATORY_CARE_PROVIDER_SITE_OTHER): Payer: Medicare HMO

## 2022-04-06 DIAGNOSIS — R7989 Other specified abnormal findings of blood chemistry: Secondary | ICD-10-CM

## 2022-04-06 DIAGNOSIS — Z23 Encounter for immunization: Secondary | ICD-10-CM

## 2022-04-06 DIAGNOSIS — I10 Essential (primary) hypertension: Secondary | ICD-10-CM | POA: Diagnosis not present

## 2022-04-06 DIAGNOSIS — J441 Chronic obstructive pulmonary disease with (acute) exacerbation: Secondary | ICD-10-CM

## 2022-04-06 DIAGNOSIS — E782 Mixed hyperlipidemia: Secondary | ICD-10-CM

## 2022-04-06 DIAGNOSIS — E559 Vitamin D deficiency, unspecified: Secondary | ICD-10-CM | POA: Diagnosis not present

## 2022-04-07 ENCOUNTER — Other Ambulatory Visit: Payer: Self-pay | Admitting: Nurse Practitioner

## 2022-04-07 DIAGNOSIS — E559 Vitamin D deficiency, unspecified: Secondary | ICD-10-CM

## 2022-04-07 LAB — COMPREHENSIVE METABOLIC PANEL
ALT: 14 IU/L (ref 0–32)
AST: 14 IU/L (ref 0–40)
Albumin/Globulin Ratio: 1.8 (ref 1.2–2.2)
Albumin: 4.1 g/dL (ref 3.9–4.9)
Alkaline Phosphatase: 96 IU/L (ref 44–121)
BUN/Creatinine Ratio: 12 (ref 12–28)
BUN: 14 mg/dL (ref 8–27)
Bilirubin Total: 0.2 mg/dL (ref 0.0–1.2)
CO2: 19 mmol/L — ABNORMAL LOW (ref 20–29)
Calcium: 9.4 mg/dL (ref 8.7–10.3)
Chloride: 106 mmol/L (ref 96–106)
Creatinine, Ser: 1.15 mg/dL — ABNORMAL HIGH (ref 0.57–1.00)
Globulin, Total: 2.3 g/dL (ref 1.5–4.5)
Glucose: 91 mg/dL (ref 70–99)
Potassium: 4 mmol/L (ref 3.5–5.2)
Sodium: 143 mmol/L (ref 134–144)
Total Protein: 6.4 g/dL (ref 6.0–8.5)
eGFR: 52 mL/min/{1.73_m2} — ABNORMAL LOW (ref >59)

## 2022-04-07 LAB — CBC WITH DIFFERENTIAL/PLATELET
Basophils Absolute: 0.1 10*3/uL (ref 0.0–0.2)
Basos: 1 %
EOS (ABSOLUTE): 0.2 10*3/uL (ref 0.0–0.4)
Eos: 2 %
Hematocrit: 44.8 % (ref 34.0–46.6)
Hemoglobin: 14.4 g/dL (ref 11.1–15.9)
Immature Grans (Abs): 0.1 10*3/uL (ref 0.0–0.1)
Immature Granulocytes: 1 %
Lymphocytes Absolute: 2.5 10*3/uL (ref 0.7–3.1)
Lymphs: 31 %
MCH: 30.2 pg (ref 26.6–33.0)
MCHC: 32.1 g/dL (ref 31.5–35.7)
MCV: 94 fL (ref 79–97)
Monocytes Absolute: 0.7 10*3/uL (ref 0.1–0.9)
Monocytes: 8 %
Neutrophils Absolute: 4.7 10*3/uL (ref 1.4–7.0)
Neutrophils: 57 %
Platelets: 238 10*3/uL (ref 150–450)
RBC: 4.77 x10E6/uL (ref 3.77–5.28)
RDW: 11.8 % (ref 11.7–15.4)
WBC: 8.2 10*3/uL (ref 3.4–10.8)

## 2022-04-07 LAB — VITAMIN D 25 HYDROXY (VIT D DEFICIENCY, FRACTURES): Vit D, 25-Hydroxy: 10.7 ng/mL — ABNORMAL LOW (ref 30.0–100.0)

## 2022-04-07 LAB — LIPID PANEL W/O CHOL/HDL RATIO
Cholesterol, Total: 134 mg/dL (ref 100–199)
HDL: 45 mg/dL (ref >39)
LDL Chol Calc (NIH): 69 mg/dL (ref 0–99)
Triglycerides: 112 mg/dL (ref 0–149)
VLDL Cholesterol Cal: 20 mg/dL (ref 5–40)

## 2022-04-07 LAB — TSH: TSH: 3.26 u[IU]/mL (ref 0.450–4.500)

## 2022-04-07 LAB — T4, FREE: Free T4: 1.07 ng/dL (ref 0.82–1.77)

## 2022-04-07 MED ORDER — CHOLECALCIFEROL 1.25 MG (50000 UT) PO TABS: 1.0000 | ORAL_TABLET | ORAL | 4 refills | Status: DC

## 2022-04-07 NOTE — Progress Notes (Signed)
Good morning please let Kaci know her labs have returned: - Vitamin D level is very low, I am going to send in weekly supplement for her to start taking for bone health and to improve level.  We will recheck next visit.  Take supplement as ordered. - Kidney function shows some mild kidney disease, this has been seen on occasion on previous labs.  I recommend increasing water intake at home and add a little lemon to this.  Liver function is stable. - Thyroid labs are normal. - Cholesterol labs are at goal, continue Rosuvastatin as ordered it is offering benefit. - CBC shows no anemia or infection.  Any questions? Keep being amazing!!  Thank you for allowing me to participate in your care.  I appreciate you. Kindest regards, Marge Vandermeulen

## 2022-04-14 ENCOUNTER — Ambulatory Visit: Payer: Self-pay

## 2022-04-14 NOTE — Telephone Encounter (Signed)
  Chief Complaint: nasal congestion Symptoms: nasal congestion and drainage bright green mucus, chills Frequency: started yesterday Pertinent Negatives: Patient denies cough or SOB, fever Disposition: '[]'$ ED /'[]'$ Urgent Care (no appt availability in office) / '[x]'$ Appointment(In office/virtual)/ '[]'$  [facility]/ '[]'$ Home Care/ '[]'$ Refused Recommended Disposition /'[]'$ [facility]/ '[]'$  Follow-up with PCP Additional Notes: pt states she completed abx and still having nasal drainage. Pt completed abx in Oct. Advised pt since she is still having sx and had VV on 03/21/22 recommended she come in for OV. Pt agreed and scheduled appt tomorrow at 0940 with Dr. Wynetta Emery.   Reason for Disposition  [1] Taking antibiotic > 7 days AND [2] nasal discharge not improved  Answer Assessment - Initial Assessment Questions 1. ANTIBIOTIC: "What antibiotic are you taking?" "How many times a day?"     Azithromycin  2. ONSET: "When was the antibiotic started?"     Back on 03/21/22 3. PAIN: "How bad is the sinus pain?"   (Scale 1-10; mild, moderate or severe)   - MILD (1-3): doesn't interfere with normal activities    - MODERATE (4-7): interferes with normal activities (e.g., work or school) or awakens from sleep   - SEVERE (8-10): excruciating pain and patient unable to do any normal activities        Mild  4. FEVER: "Do you have a fever?" If Yes, ask: "What is it, how was it measured, and when did it start?"      no 5. SYMPTOMS: "Are there any other symptoms you're concerned about?" If Yes, ask: "When did it start?"     Chills, nasal drainage/congestion  Protocols used: Sinus Infection on Antibiotic Follow-up Call-[facility]

## 2022-04-14 NOTE — Telephone Encounter (Signed)
Patient called, left VM to return the call to the office to discuss symptoms with a nurse.  Summary: cough and congestion   The patient shares that they have previously been prescribed antibiotics  The patient shares that they are now experiencing bright green mucus out of their nose  The patient shares that they were previously coughing up green mucus but this time it is mainly in their nose  Please contact the patient further when possible

## 2022-04-15 ENCOUNTER — Encounter: Payer: Self-pay | Admitting: Family Medicine

## 2022-04-15 ENCOUNTER — Ambulatory Visit (INDEPENDENT_AMBULATORY_CARE_PROVIDER_SITE_OTHER): Payer: Medicare HMO | Admitting: Family Medicine

## 2022-04-15 VITALS — BP 91/64 | HR 73 | Temp 98.2°F | Ht 66.0 in | Wt 132.2 lb

## 2022-04-15 DIAGNOSIS — J441 Chronic obstructive pulmonary disease with (acute) exacerbation: Secondary | ICD-10-CM | POA: Diagnosis not present

## 2022-04-15 MED ORDER — AZITHROMYCIN 250 MG PO TABS: ORAL_TABLET | ORAL | 0 refills | Status: AC

## 2022-04-15 MED ORDER — PREDNISONE 20 MG PO TABS: 40.0000 mg | ORAL_TABLET | Freq: Every day | ORAL | 0 refills | Status: AC

## 2022-04-15 NOTE — Progress Notes (Signed)
BP 91/64   Pulse 73   Temp 98.2 F (36.8 C) (Oral)   Ht _0  (1.676 m)   Wt 132 lb 3.2 oz (60 kg)   SpO2 98%   BMI 21.34 kg/m    Subjective:    Patient ID: Caitlyn May, female    DOB: 30-Jun-1955, 66 y.o.   MRN: [id number]  HPI: Caitlyn May is a 66 y.o. female  Chief Complaint  Patient presents with   Nasal Congestion    Patient says she was seen by [facility] for Cough and says she thought she was feeling better and she says she is having her symptoms to start up again. Patient says she is coughing up green mucus again. Patient says she was prescribed Cough Syrup, antibiotic (z-pac) and a steroid. Patient says the medicine helped a little but it is back again.    Cough   UPPER RESPIRATORY TRACT INFECTION Duration: couple of days Worst symptom: cough Fever: no Cough: yes Shortness of breath: yes Wheezing: yes Chest pain: yes Chest tightness: yes Chest congestion: yes Nasal congestion: yes Runny nose: yes Post nasal drip: yes Sneezing: no Sore throat: no Swollen glands: no Sinus pressure: yes Headache: no Face pain: no Toothache: yes Ear pain: no  Ear pressure: no  Eyes red/itching:no Eye drainage/crusting: no  Vomiting: no Rash: no Fatigue: yes Sick contacts: no Strep contacts: no  Context: worse Recurrent sinusitis: no Relief with OTC cold/cough medications: no  Treatments attempted: inhalers   Relevant past medical, surgical, family and social history reviewed and updated as indicated. Interim medical history since our last visit reviewed. Allergies and medications reviewed and updated.  Review of Systems  Constitutional: Negative.   HENT: Negative.    Eyes: Negative.   Respiratory:  Positive for cough, chest tightness, shortness of breath and wheezing. Negative for choking and stridor.   Cardiovascular: Negative.   Gastrointestinal: Negative.   Musculoskeletal: Negative.   Psychiatric/Behavioral: Negative.      Per HPI unless  specifically indicated above     Objective:    BP 91/64   Pulse 73   Temp 98.2 F (36.8 C) (Oral)   Ht _1  (1.676 m)   Wt 132 lb 3.2 oz (60 kg)   SpO2 98%   BMI 21.34 kg/m   Wt Readings from Last 3 Encounters:  04/15/22 132 lb 3.2 oz (60 kg)  03/01/22 135 lb 3.2 oz (61.3 kg)  11/29/21 126 lb 12.8 oz (57.5 kg)    Physical Exam Vitals and nursing note reviewed.  Constitutional:      General: She is not in acute distress.    Appearance: Normal appearance. She is not ill-appearing, toxic-appearing or diaphoretic.  HENT:     Head: Normocephalic and atraumatic.     Right Ear: Tympanic membrane, ear canal and external ear normal.     Left Ear: Tympanic membrane, ear canal and external ear normal.     Nose: Nose normal.     Mouth/Throat:     Mouth: Mucous membranes are moist.     Pharynx: Oropharynx is clear.  Eyes:     General: No scleral icterus.       Right eye: No discharge.        Left eye: No discharge.     Extraocular Movements: Extraocular movements intact.     Conjunctiva/sclera: Conjunctivae normal.     Pupils: Pupils are equal, round, and reactive to light.  Cardiovascular:     Rate and Rhythm: Normal rate  and regular rhythm.     Pulses: Normal pulses.     Heart sounds: Normal heart sounds. No murmur heard.    No friction rub. No gallop.  Pulmonary:     Effort: Pulmonary effort is normal. No respiratory distress.     Breath sounds: No stridor. Wheezing present. No rhonchi or rales.  Chest:     Chest wall: No tenderness.  Musculoskeletal:        General: Normal range of motion.     Cervical back: Normal range of motion and neck supple.  Skin:    General: Skin is warm and dry.     Capillary Refill: Capillary refill takes less than 2 seconds.     Coloration: Skin is not jaundiced or pale.     Findings: No bruising, erythema, lesion or rash.  Neurological:     General: No focal deficit present.     Mental Status: She is alert and oriented to person, place,  and time. Mental status is at baseline.  Psychiatric:        Mood and Affect: Mood normal.        Behavior: Behavior normal.        Thought Content: Thought content normal.        Judgment: Judgment normal.     Results for orders placed or performed in visit on 04/06/22  T4, free  Result Value Ref Range   Free T4 1.07 0.82 - 1.77 ng/dL  TSH  Result Value Ref Range   TSH 3.260 0.450 - 4.500 uIU/mL  VITAMIN D 25 Hydroxy (Vit-D Deficiency, Fractures)  Result Value Ref Range   Vit D, 25-Hydroxy 10.7 (L) 30.0 - 100.0 ng/mL  Lipid Panel w/o Chol/HDL Ratio  Result Value Ref Range   Cholesterol, Total 134 100 - 199 mg/dL   Triglycerides 112 0 - 149 mg/dL   HDL 45 >39 mg/dL   VLDL Cholesterol Cal 20 5 - 40 mg/dL   LDL Chol Calc (NIH) 69 0 - 99 mg/dL  Comprehensive metabolic panel  Result Value Ref Range   Glucose 91 70 - 99 mg/dL   BUN 14 8 - 27 mg/dL   Creatinine, Ser 1.15 (H) 0.57 - 1.00 mg/dL   eGFR 52 (L) >59 mL/min/1.73   BUN/Creatinine Ratio 12 12 - 28   Sodium 143 134 - 144 mmol/L   Potassium 4.0 3.5 - 5.2 mmol/L   Chloride 106 96 - 106 mmol/L   CO2 19 (L) 20 - 29 mmol/L   Calcium 9.4 8.7 - 10.3 mg/dL   Total Protein 6.4 6.0 - 8.5 g/dL   Albumin 4.1 3.9 - 4.9 g/dL   Globulin, Total 2.3 1.5 - 4.5 g/dL   Albumin/Globulin Ratio 1.8 1.2 - 2.2   Bilirubin Total 0.2 0.0 - 1.2 mg/dL   Alkaline Phosphatase 96 44 - 121 IU/L   AST 14 0 - 40 IU/L   ALT 14 0 - 32 IU/L  CBC with Differential/Platelet  Result Value Ref Range   WBC 8.2 3.4 - 10.8 x10E3/uL   RBC 4.77 3.77 - 5.28 x10E6/uL   Hemoglobin 14.4 11.1 - 15.9 g/dL   Hematocrit 44.8 34.0 - 46.6 %   MCV 94 79 - 97 fL   MCH 30.2 26.6 - 33.0 pg   MCHC 32.1 31.5 - 35.7 g/dL   RDW 11.8 11.7 - 15.4 %   Platelets 238 150 - 450 x10E3/uL   Neutrophils 57 Not Estab. %   Lymphs 31 Not Estab. %  Monocytes 8 Not Estab. %   Eos 2 Not Estab. %   Basos 1 Not Estab. %   Neutrophils Absolute 4.7 1.4 - 7.0 x10E3/uL   Lymphocytes  Absolute 2.5 0.7 - 3.1 x10E3/uL   Monocytes Absolute 0.7 0.1 - 0.9 x10E3/uL   EOS (ABSOLUTE) 0.2 0.0 - 0.4 x10E3/uL   Basophils Absolute 0.1 0.0 - 0.2 x10E3/uL   Immature Granulocytes 1 Not Estab. %   Immature Grans (Abs) 0.1 0.0 - 0.1 x10E3/uL      Assessment & Plan:   Problem List Items Addressed This Visit   None Visit Diagnoses     COPD exacerbation ([place])    -  Primary   Will treat with prednisone and azithromycin. Call with any concerns or if not getting better. Continue to monitor.   Relevant Medications   azithromycin (ZITHROMAX) 250 MG tablet   predniSONE (DELTASONE) 20 MG tablet        Follow up plan: Return if symptoms worsen or fail to improve.

## 2022-05-05 ENCOUNTER — Ambulatory Visit: Payer: Medicare HMO | Admitting: Physician Assistant

## 2022-05-18 ENCOUNTER — Telehealth: Payer: Self-pay

## 2022-05-18 NOTE — Chronic Care Management (AMB) (Signed)
    Chronic Care Management Pharmacy Assistant   Name: Jani Moronta  MRN: [id number] DOB: 1956-03-10   Reason for Encounter: Medication Review   Medications: Outpatient Encounter Medications as of 05/18/2022  Medication Sig   albuterol (VENTOLIN HFA) 108 (90 Base) MCG/ACT inhaler Inhale 2 puffs into the lungs every 6 (six) hours as needed for wheezing or shortness of breath.   amLODipine (NORVASC) 5 MG tablet Take 1 tablet (5 mg total) by mouth daily.   atorvastatin (LIPITOR) 40 MG tablet Take 40 mg by mouth daily.   buPROPion (WELLBUTRIN XL) 150 MG 24 hr tablet Take 1 tablet (150 mg total) by mouth daily.   carvedilol (COREG) 12.5 MG tablet Take 1 tablet (12.5 mg total) by mouth 2 (two) times daily.   Cholecalciferol 1.25 MG (50000 UT) TABS Take 1 tablet by mouth once a week.   Fluticasone-Umeclidin-Vilant (TRELEGY ELLIPTA) 100-62.5-25 MCG/ACT AEPB Inhale 1 puff into the lungs daily.   gabapentin (NEURONTIN) 300 MG capsule Take 1 capsule (300 mg total) by mouth 3 (three) times daily.   hydrOXYzine (VISTARIL) 25 MG capsule Take 1 capsule (25 mg total) by mouth every 8 (eight) hours as needed for anxiety.   megestrol (MEGACE) 40 MG tablet Take 1 tablet (40 mg total) by mouth 4 (four) times daily as needed.   montelukast (SINGULAIR) 10 MG tablet Take 1 tablet (10 mg total) by mouth at bedtime.   nicotine polacrilex (NICOTINE MINI) 2 MG lozenge Take 1 lozenge (2 mg total) by mouth every 2 (two) hours as needed for smoking cessation.   olmesartan (BENICAR) 40 MG tablet Take 1 tablet (40 mg total) by mouth daily.   ondansetron (ZOFRAN ODT) 8 MG disintegrating tablet Take 1 tablet (8 mg total) by mouth every 8 (eight) hours as needed for nausea or vomiting.   pantoprazole (PROTONIX) 40 MG tablet Take 1 tablet (40 mg total) by mouth at bedtime.   rosuvastatin (CRESTOR) 40 MG tablet Take 1 tablet (40 mg total) by mouth daily. (Patient not taking: Reported on 04/15/2022)   sertraline (ZOLOFT)  100 MG tablet Take 1.5 tablets (150 mg total) by mouth daily.   tiZANidine (ZANAFLEX) 4 MG tablet TAKE 1 TABLET(4 MG) BY MOUTH EVERY 6 HOURS AS NEEDED FOR MUSCLE SPASMS   Vitamin D, Ergocalciferol, (DRISDOL) 1.25 MG (50000 UNIT) CAPS capsule Take 50,000 Units by mouth every 7 (seven) days.   No facility-administered encounter medications on file as of 05/18/2022.   Reviewed chart for medication changes ahead of medication coordination call.  No OVs, Consults, or hospital visits since last care coordination call/Pharmacist visit. (If appropriate, list visit date, provider name)  No medication changes indicated OR if recent visit, treatment plan here.  BP Readings from Last 3 Encounters:  04/15/22 91/64  03/01/22 122/74  11/29/21 128/80    No results found for: "HGBA1C"   Patient did not answer to complete her medication review.   Corrie Mckusick, [place]

## 2022-05-24 ENCOUNTER — Other Ambulatory Visit: Payer: Self-pay | Admitting: Nurse Practitioner

## 2022-05-25 ENCOUNTER — Other Ambulatory Visit: Payer: Self-pay | Admitting: Nurse Practitioner

## 2022-05-25 MED ORDER — HYDROXYZINE PAMOATE 25 MG PO CAPS: 25.0000 mg | ORAL_CAPSULE | Freq: Three times a day (TID) | ORAL | 1 refills | Status: DC | PRN

## 2022-05-26 NOTE — Telephone Encounter (Signed)
Already ordered by the provider on 05/25/22, will refuse.  Requested Prescriptions  Pending Prescriptions Disp Refills   hydrOXYzine (VISTARIL) 25 MG capsule [Pharmacy Med Name: hydroxyzine pamoate 25 mg capsule] 90 capsule 1    Sig: TAKE ONE CAPSULE BY MOUTH every EIGHT HOURS AS NEEDED FOR anxiety     Ear, Nose, and Throat:  Antihistamines 2 Failed - 05/24/2022 12:40 PM      Failed - Cr in normal range and within 360 days    Creatinine, Ser  Date Value Ref Range Status  04/06/2022 1.15 (H) 0.57 - 1.00 mg/dL Final         Passed - Valid encounter within last 12 months    Recent Outpatient Visits           1 month ago COPD exacerbation ([place])   [place], Megan P, DO   2 months ago COPD exacerbation ([place])   [place], Jolene T, NP   5 months ago Centrilobular emphysema ([place])   [place], Henrine Screws T, NP   6 months ago [place], MD   7 months ago Centrilobular emphysema [facility])   [place], Barbaraann Faster, NP

## 2022-06-06 ENCOUNTER — Telehealth: Payer: Self-pay

## 2022-06-06 NOTE — Progress Notes (Cosign Needed)
Patient assistance application for Trelegy has been completed on behalf of the patient.  Left message for patient to return my call. Will mail out application on 1/65/53. Will make patient aware they will need to provide requested income verification to be sent the manufacturer.    Caitlyn May

## 2022-06-12 NOTE — Patient Instructions (Incomplete)

## 2022-06-15 ENCOUNTER — Ambulatory Visit: Payer: Medicare HMO | Admitting: Nurse Practitioner

## 2022-06-21 ENCOUNTER — Ambulatory Visit: Payer: Medicare HMO | Admitting: Nurse Practitioner

## 2022-07-01 ENCOUNTER — Other Ambulatory Visit: Payer: Self-pay | Admitting: Physician Assistant

## 2022-07-01 ENCOUNTER — Telehealth: Payer: Self-pay | Admitting: Nurse Practitioner

## 2022-07-01 NOTE — Telephone Encounter (Signed)
Copied from [place] [phone]. Topic: Medicare AWV >> Jul 01, 2022 11:30 AM Devoria Glassing wrote: Reason for CRM: Left message for patient to schedule Annual Wellness Visit.  Please schedule with Health Nurse Advisor Kirke Shaggy at [facility].Call [place] at [phone]

## 2022-07-21 ENCOUNTER — Telehealth: Payer: Self-pay | Admitting: Nurse Practitioner

## 2022-07-21 NOTE — Telephone Encounter (Signed)
Contacted [name] to schedule their annual wellness visit. Appointment made for 08/09/2022.  Sherol Dade; Care Guide Ambulatory [facility] Direct Dial: [phone]

## 2022-07-27 ENCOUNTER — Telehealth (INDEPENDENT_AMBULATORY_CARE_PROVIDER_SITE_OTHER): Payer: Self-pay | Admitting: Nurse Practitioner

## 2022-07-27 ENCOUNTER — Other Ambulatory Visit: Payer: Self-pay | Admitting: Nurse Practitioner

## 2022-07-27 DIAGNOSIS — J432 Centrilobular emphysema: Secondary | ICD-10-CM

## 2022-07-27 NOTE — Progress Notes (Signed)
No show for visit

## 2022-07-28 ENCOUNTER — Other Ambulatory Visit: Payer: Self-pay | Admitting: Nurse Practitioner

## 2022-07-28 NOTE — Telephone Encounter (Signed)
Requested medication (s) are due for refill today: yes  Requested medication (s) are on the active medication list: yes  Last refill:  03/25/22  Future visit scheduled: yes  Notes to clinic:  Unable to refill per protocol, cannot delegate.      Requested Prescriptions  Pending Prescriptions Disp Refills   tiZANidine (ZANAFLEX) 4 MG tablet [Pharmacy Med Name: tizanidine 4 mg tablet] 90 tablet 3    Sig: TAKE ONE TABLET BY MOUTH EVERY SIX HOURS AS NEEDED FOR muscle SPASMS     Not Delegated - Cardiovascular:  Alpha-2 Agonists - tizanidine Failed - 07/27/2022  2:07 PM      Failed - This refill cannot be delegated      Passed - Valid encounter within last 6 months    Recent Outpatient Visits           Yesterday Centrilobular emphysema ([place])   [place], Henrine Screws T, NP   3 months ago COPD exacerbation ([place])   [place], Megan P, DO   4 months ago COPD exacerbation ([place])   [place], Henrine Screws T, NP   8 months ago Centrilobular emphysema ([place])   [place], Henrine Screws T, NP   8 months ago [place], MD               hydrOXYzine (VISTARIL) 25 MG capsule [Pharmacy Med Name: hydroxyzine pamoate 25 mg capsule] 90 capsule 3    Sig: TAKE ONE CAPSULE BY MOUTH EVERY 8 HOURS AS NEEDED FOR ANXIETY     Ear, Nose, and Throat:  Antihistamines 2 Failed - 07/27/2022  2:07 PM      Failed - Cr in normal range and within 360 days    Creatinine, Ser  Date Value Ref Range Status  04/06/2022 1.15 (H) 0.57 - 1.00 mg/dL Final         Passed - Valid encounter within last 12 months    Recent Outpatient Visits           Yesterday Centrilobular emphysema ([place])   [place], Henrine Screws T, NP   3 months ago COPD exacerbation [facility])   [place], Megan P,  DO   4 months ago COPD exacerbation [facility])   [place], Henrine Screws T, NP   8 months ago Centrilobular emphysema ([place])   [place], Henrine Screws T, NP   8 months ago Anxiety   [place] [facility] Charlynne Cousins, MD

## 2022-07-28 NOTE — Telephone Encounter (Signed)
Unable to refill per protocol, appointment needed.   Requested Prescriptions  Pending Prescriptions Disp Refills   tiZANidine (ZANAFLEX) 4 MG tablet [Pharmacy Med Name: tizanidine 4 mg tablet] 90 tablet 3    Sig: TAKE ONE TABLET BY MOUTH EVERY SIX HOURS AS NEEDED FOR muscle SPASMS     Not Delegated - Cardiovascular:  Alpha-2 Agonists - tizanidine Failed - 07/28/2022  9:21 AM      Failed - This refill cannot be delegated      Passed - Valid encounter within last 6 months    Recent Outpatient Visits           Yesterday Centrilobular emphysema ([place])   [place], [place] T, NP   3 months ago COPD exacerbation ([place])   [place], Megan P, DO   4 months ago COPD exacerbation ([place])   [place], Henrine Screws T, NP   8 months ago Centrilobular emphysema ([place])   [place], Henrine Screws T, NP   8 months ago [place], MD               hydrOXYzine (VISTARIL) 25 MG capsule [Pharmacy Med Name: hydroxyzine pamoate 25 mg capsule] 90 capsule 3    Sig: TAKE ONE CAPSULE BY MOUTH EVERY 8 HOURS AS NEEDED FOR ANXIETY     Ear, Nose, and Throat:  Antihistamines 2 Failed - 07/28/2022  9:21 AM      Failed - Cr in normal range and within 360 days    Creatinine, Ser  Date Value Ref Range Status  04/06/2022 1.15 (H) 0.57 - 1.00 mg/dL Final         Passed - Valid encounter within last 12 months    Recent Outpatient Visits           Yesterday Centrilobular emphysema ([place])   [place], Henrine Screws T, NP   3 months ago COPD exacerbation [facility])   [place], Megan P, DO   4 months ago COPD exacerbation [facility])   [place], Henrine Screws T, NP   8 months ago Centrilobular emphysema ([place])   [place],  Henrine Screws T, NP   8 months ago Anxiety   [place] [facility] Charlynne Cousins, MD

## 2022-07-28 NOTE — Telephone Encounter (Signed)
Unable to refill per protocol, appointment needed.  Requested Prescriptions  Pending Prescriptions Disp Refills   tiZANidine (ZANAFLEX) 4 MG tablet 90 tablet 3    Sig: TAKE 1 TABLET(4 MG) BY MOUTH EVERY 6 HOURS AS NEEDED FOR MUSCLE SPASMS     Not Delegated - Cardiovascular:  Alpha-2 Agonists - tizanidine Failed - 07/28/2022  9:49 AM      Failed - This refill cannot be delegated      Passed - Valid encounter within last 6 months    Recent Outpatient Visits           Yesterday Centrilobular emphysema ([place])   [place], [place] T, NP   3 months ago COPD exacerbation ([place])   [place], Megan P, DO   4 months ago COPD exacerbation ([place])   [place], [place] T, NP   8 months ago Centrilobular emphysema ([place])   [place], Oretta T, NP   8 months ago [place], Avanti, MD               hydrOXYzine (VISTARIL) 25 MG capsule 90 capsule 1    Sig: Take 1 capsule (25 mg total) by mouth every 8 (eight) hours as needed for anxiety.     Ear, Nose, and Throat:  Antihistamines 2 Failed - 07/28/2022  9:49 AM      Failed - Cr in normal range and within 360 days    Creatinine, Ser  Date Value Ref Range Status  04/06/2022 1.15 (H) 0.57 - 1.00 mg/dL Final         Passed - Valid encounter within last 12 months    Recent Outpatient Visits           Yesterday Centrilobular emphysema ([place])   [place], [place] T, NP   3 months ago COPD exacerbation [facility])   [place], Megan P, DO   4 months ago COPD exacerbation [facility])   [place], Henrine Screws T, NP   8 months ago Centrilobular emphysema [facility])   [place], Henrine Screws T, NP   8 months ago Anxiety   [place] [facility] Charlynne Cousins, MD

## 2022-07-28 NOTE — Telephone Encounter (Signed)
Copied from [place] [phone]. Topic: General - Other >> Jul 28, 2022  9:21 AM Everette C wrote: Reason for CRM: Medication Refill - Medication: hydrOXYzine (VISTARIL) 25 MG capsule [id number]  tiZANidine (ZANAFLEX) 4 MG tablet [id number]  Caitlyn May has stressed the urgency of the request, the patient's medications will be packaged 07/29/22  Has the patient contacted their pharmacy? Yes.   (Agent: If no, request that the patient contact the pharmacy for the refill. If patient does not wish to contact the pharmacy document the reason why and proceed with request.) (Agent: If yes, when and what did the pharmacy advise?)  Preferred Pharmacy (with phone number or street name): [facility] - [place], [place] - [address] [place] [address] Phone: [phone] Fax: [phone] Hours: Not open 24 hours   Has the patient been seen for an appointment in the last year OR does the patient have an upcoming appointment? Yes.    Agent: Please be advised that RX refills may take up to 3 business days. We ask that you follow-up with your pharmacy.

## 2022-07-29 ENCOUNTER — Other Ambulatory Visit: Payer: Self-pay | Admitting: Nurse Practitioner

## 2022-07-29 NOTE — Telephone Encounter (Signed)
Unable to refill per protocol, appointment needed.   Requested Prescriptions  Pending Prescriptions Disp Refills   tiZANidine (ZANAFLEX) 4 MG tablet [Pharmacy Med Name: tizanidine 4 mg tablet] 90 tablet 3    Sig: TAKE ONE TABLET BY MOUTH EVERY SIX HOURS AS NEEDED FOR muscle SPASMS     Not Delegated - Cardiovascular:  Alpha-2 Agonists - tizanidine Failed - 07/29/2022  8:28 AM      Failed - This refill cannot be delegated      Passed - Valid encounter within last 6 months    Recent Outpatient Visits           2 days ago Centrilobular emphysema ([place])   [place], [place] T, NP   3 months ago COPD exacerbation ([place])   [place], Megan P, DO   4 months ago COPD exacerbation ([place])   [place], Henrine Screws T, NP   8 months ago Centrilobular emphysema ([place])   [place], Henrine Screws T, NP   8 months ago [place] Vigg, Avanti, MD               hydrOXYzine (VISTARIL) 25 MG capsule [Pharmacy Med Name: hydroxyzine pamoate 25 mg capsule] 90 capsule 1    Sig: TAKE ONE CAPSULE BY MOUTH EVERY 8 HOURS AS NEEDED FOR ANXIETY     Ear, Nose, and Throat:  Antihistamines 2 Failed - 07/29/2022  8:28 AM      Failed - Cr in normal range and within 360 days    Creatinine, Ser  Date Value Ref Range Status  04/06/2022 1.15 (H) 0.57 - 1.00 mg/dL Final         Passed - Valid encounter within last 12 months    Recent Outpatient Visits           2 days ago Centrilobular emphysema ([place])   [place], Henrine Screws T, NP   3 months ago COPD exacerbation [facility])   [place], Megan P, DO   4 months ago COPD exacerbation [facility])   [place], Henrine Screws T, NP   8 months ago Centrilobular emphysema ([place])   [place],  Henrine Screws T, NP   8 months ago Anxiety    [facility] Charlynne Cousins, MD

## 2022-08-09 ENCOUNTER — Ambulatory Visit (INDEPENDENT_AMBULATORY_CARE_PROVIDER_SITE_OTHER): Payer: Medicare HMO

## 2022-08-09 VITALS — Ht 66.0 in | Wt 132.0 lb

## 2022-08-09 DIAGNOSIS — Z122 Encounter for screening for malignant neoplasm of respiratory organs: Secondary | ICD-10-CM

## 2022-08-09 DIAGNOSIS — Z0000 Encounter for general adult medical examination without abnormal findings: Secondary | ICD-10-CM | POA: Diagnosis not present

## 2022-08-09 NOTE — Patient Instructions (Signed)
Caitlyn May , Thank you for taking time to come for your Medicare Wellness Visit. I appreciate your ongoing commitment to your health goals. Please review the following plan we discussed and let me know if I can assist you in the future.   These are the goals we discussed:  Goals      DIET - EAT MORE FRUITS AND VEGETABLES     DIET - INCREASE WATER INTAKE     Recommend drinking at least 5-6 glasses of water a day      Patient Stated     06/12/2020, no goals     Quit Smoking     Smoking cessation discussed     Quit Smoking     Continue to decrease smoking     RNCM: Effective Management of COPD     Care Coordination Interventions: Provided patient with basic written and verbal COPD education on self care/management/and exacerbation prevention. 12-15-2021: Review and education provided to the patient on monitoring for changes in her breathing and pacing activity.  Advised patient to track and manage COPD triggers. 12-15-2021: Review of factors that can cause exacerbation pf COPD. She is not going outside due to the heat right now unless she absolutely has to do so.  Provided written and verbal instructions on pursed lip breathing and utilized returned demonstration as teach back Provided instruction about proper use of medications used for management of COPD including inhalers. 12-15-2021: Has an appointment next week with pharm D for assistance with inhaler cost.  Advised patient to self assesses COPD action plan zone and make appointment with provider if in the yellow zone for 48 hours without improvement. 12-15-2021: Review and education provided. Advised patient to engage in light exercise as tolerated 3-5 days a week to aid in the the management of COPD Provided education about and advised patient to utilize infection prevention strategies to reduce risk of respiratory infection. 12-15-2021: The patient with a productive cough noted. The patient states it is clear. Last week did have "thick  yellow drainage" from her nose. Education on sx and sx of infection and to call the pcp for changes. Discussed the importance of adequate rest and management of fatigue with COPD Screening for signs and symptoms of depression related to chronic disease state  Assessed social determinant of health barriers Reminder given of upcoming appointments today           This is a list of the screening recommended for you and due dates:  Health Maintenance  Topic Date Due   COVID-19 Vaccine (1) Never done   Hepatitis C Screening: USPSTF Recommendation to screen - Ages 56-79 yo.  Never done   Colon Cancer Screening  Never done   Mammogram  Never done   Zoster (Shingles) Vaccine (1 of 2) Never done   DTaP/Tdap/Td vaccine (2 - Tdap) 10/31/2017   DEXA scan (bone density measurement)  Never done   Medicare Annual Wellness Visit  08/09/2023   Pneumonia Vaccine  Completed   Flu Shot  Completed   HPV Vaccine  Aged Out    Advanced directives: no  Conditions/risks identified: none  Next appointment: Follow up in one year for your annual wellness visit 08/15/23 @ 11:15 am by phone   Preventive Care 65 Years and Older, Female Preventive care refers to lifestyle choices and visits with your health care provider that can promote health and wellness. What does preventive care include? A yearly physical exam. This is also called an annual well check. Dental  exams once or twice a year. Routine eye exams. Ask your health care provider how often you should have your eyes checked. Personal lifestyle choices, including: Daily care of your teeth and gums. Regular physical activity. Eating a healthy diet. Avoiding tobacco and drug use. Limiting alcohol use. Practicing safe sex. Taking low-dose aspirin every day. Taking vitamin and mineral supplements as recommended by your health care provider. What happens during an annual well check? The services and screenings done by your health care provider  during your annual well check will depend on your age, overall health, lifestyle risk factors, and family history of disease. Counseling  Your health care provider may ask you questions about your: Alcohol use. Tobacco use. Drug use. Emotional well-being. Home and relationship well-being. Sexual activity. Eating habits. History of falls. Memory and ability to understand (cognition). Work and work Statistician. Reproductive health. Screening  You may have the following tests or measurements: Height, weight, and BMI. Blood pressure. Lipid and cholesterol levels. These may be checked every 5 years, or more frequently if you are over 21 years old. Skin check. Lung cancer screening. You may have this screening every year starting at age 89 if you have a 30-pack-year history of smoking and currently smoke or have quit within the past 15 years. Fecal occult blood test (FOBT) of the stool. You may have this test every year starting at age 31. Flexible sigmoidoscopy or colonoscopy. You may have a sigmoidoscopy every 5 years or a colonoscopy every 10 years starting at age 68. Hepatitis C blood test. Hepatitis B blood test. Sexually transmitted disease (STD) testing. Diabetes screening. This is done by checking your blood sugar (glucose) after you have not eaten for a while (fasting). You may have this done every 1-3 years. Bone density scan. This is done to screen for osteoporosis. You may have this done starting at age 28. Mammogram. This may be done every 1-2 years. Talk to your health care provider about how often you should have regular mammograms. Talk with your health care provider about your test results, treatment options, and if necessary, the need for more tests. Vaccines  Your health care provider may recommend certain vaccines, such as: Influenza vaccine. This is recommended every year. Tetanus, diphtheria, and acellular pertussis (Tdap, Td) vaccine. You may need a Td booster every  10 years. Zoster vaccine. You may need this after age 79. Pneumococcal 13-valent conjugate (PCV13) vaccine. One dose is recommended after age 69. Pneumococcal polysaccharide (PPSV23) vaccine. One dose is recommended after age 56. Talk to your health care provider about which screenings and vaccines you need and how often you need them. This information is not intended to replace advice given to you by your health care provider. Make sure you discuss any questions you have with your health care provider. Document Released: 06/12/2015 Document Revised: 02/03/2016 Document Reviewed: 03/17/2015 Elsevier Interactive Patient Education  2017 [place] Prevention in the Home Falls can cause injuries. They can happen to people of all ages. There are many things you can do to make your home safe and to help prevent falls. What can I do on the outside of my home? Regularly fix the edges of walkways and driveways and fix any cracks. Remove anything that might make you trip as you walk through a door, such as a raised step or threshold. Trim any bushes or trees on the path to your home. Use bright outdoor lighting. Clear any walking paths of anything that might make someone  trip, such as rocks or tools. Regularly check to see if handrails are loose or broken. Make sure that both sides of any steps have handrails. Any raised decks and porches should have guardrails on the edges. Have any leaves, snow, or ice cleared regularly. Use sand or salt on walking paths during winter. Clean up any spills in your garage right away. This includes oil or grease spills. What can I do in the bathroom? Use night lights. Install grab bars by the toilet and in the tub and shower. Do not use towel bars as grab bars. Use non-skid mats or decals in the tub or shower. If you need to sit down in the shower, use a plastic, non-slip stool. Keep the floor dry. Clean up any water that spills on the floor as soon as it  happens. Remove soap buildup in the tub or shower regularly. Attach bath mats securely with double-sided non-slip rug tape. Do not have throw rugs and other things on the floor that can make you trip. What can I do in the bedroom? Use night lights. Make sure that you have a light by your bed that is easy to reach. Do not use any sheets or blankets that are too big for your bed. They should not hang down onto the floor. Have a firm chair that has side arms. You can use this for support while you get dressed. Do not have throw rugs and other things on the floor that can make you trip. What can I do in the kitchen? Clean up any spills right away. Avoid walking on wet floors. Keep items that you use a lot in easy-to-reach places. If you need to reach something above you, use a strong step stool that has a grab bar. Keep electrical cords out of the way. Do not use floor polish or wax that makes floors slippery. If you must use wax, use non-skid floor wax. Do not have throw rugs and other things on the floor that can make you trip. What can I do with my stairs? Do not leave any items on the stairs. Make sure that there are handrails on both sides of the stairs and use them. Fix handrails that are broken or loose. Make sure that handrails are as long as the stairways. Check any carpeting to make sure that it is firmly attached to the stairs. Fix any carpet that is loose or worn. Avoid having throw rugs at the top or bottom of the stairs. If you do have throw rugs, attach them to the floor with carpet tape. Make sure that you have a light switch at the top of the stairs and the bottom of the stairs. If you do not have them, ask someone to add them for you. What else can I do to help prevent falls? Wear shoes that: Do not have high heels. Have rubber bottoms. Are comfortable and fit you well. Are closed at the toe. Do not wear sandals. If you use a stepladder: Make sure that it is fully opened.  Do not climb a closed stepladder. Make sure that both sides of the stepladder are locked into place. Ask someone to hold it for you, if possible. Clearly mark and make sure that you can see: Any grab bars or handrails. First and last steps. Where the edge of each step is. Use tools that help you move around (mobility aids) if they are needed. These include: Canes. Walkers. Scooters. Crutches. Turn on the lights when you go  into a dark area. Replace any light bulbs as soon as they burn out. Set up your furniture so you have a clear path. Avoid moving your furniture around. If any of your floors are uneven, fix them. If there are any pets around you, be aware of where they are. Review your medicines with your doctor. Some medicines can make you feel dizzy. This can increase your chance of falling. Ask your doctor what other things that you can do to help prevent falls. This information is not intended to replace advice given to you by your health care provider. Make sure you discuss any questions you have with your health care provider. Document Released: 03/12/2009 Document Revised: 10/22/2015 Document Reviewed: 06/20/2014 Elsevier Interactive Patient Education  2017 [employer].

## 2022-08-09 NOTE — Progress Notes (Signed)
I connected with  [name] on 08/09/22 by a audio enabled telemedicine application and verified that I am speaking with the correct person using two identifiers.  Patient Location: Home  Provider Location: Office/Clinic  I discussed the limitations of evaluation and management by telemedicine. The patient expressed understanding and agreed to proceed.  Subjective:   Caitlyn May is a 67 y.o. female who presents for Medicare Annual (Subsequent) preventive examination.  Review of Systems     Cardiac Risk Factors include: advanced age (>11mn, >>38women);hypertension;sedentary lifestyle;smoking/ tobacco exposure     Objective:    Today's Vitals   08/09/22 1328  PainSc: 5    There is no height or weight on file to calculate BMI.     08/09/2022    1:38 PM 06/22/2021    9:25 AM 06/22/2021    9:08 AM 06/12/2020    2:33 PM 06/10/2019    4:00 PM 06/25/2018    4:21 PM 05/03/2018    1:44 PM  Advanced Directives  Does Patient Have a Medical Advance Directive? No No No No No No No  Would patient like information on creating a medical advance directive? No - Patient declined No - Patient declined No - Patient declined  Yes (MAU/Ambulatory/Procedural Areas - Information given)  Yes (MAU/Ambulatory/Procedural Areas - Information given)    Current Medications (verified) Outpatient Encounter Medications as of 08/09/2022  Medication Sig   albuterol (VENTOLIN HFA) 108 (90 Base) MCG/ACT inhaler Inhale 2 puffs into the lungs every 6 (six) hours as needed for wheezing or shortness of breath.   amLODipine (NORVASC) 5 MG tablet Take 1 tablet (5 mg total) by mouth daily.   atorvastatin (LIPITOR) 40 MG tablet Take 40 mg by mouth daily.   buPROPion (WELLBUTRIN XL) 150 MG 24 hr tablet Take 1 tablet (150 mg total) by mouth daily.   carvedilol (COREG) 12.5 MG tablet Take 1 tablet (12.5 mg total) by mouth 2 (two) times daily.   Cholecalciferol 1.25 MG (50000 UT) TABS Take 1 tablet by mouth once a  week.   Fluticasone-Umeclidin-Vilant (TRELEGY ELLIPTA) 100-62.5-25 MCG/ACT AEPB Inhale 1 puff into the lungs daily.   gabapentin (NEURONTIN) 300 MG capsule Take 1 capsule (300 mg total) by mouth 3 (three) times daily.   hydrOXYzine (VISTARIL) 25 MG capsule Take 1 capsule (25 mg total) by mouth every 8 (eight) hours as needed for anxiety.   megestrol (MEGACE) 40 MG tablet Take 1 tablet (40 mg total) by mouth 4 (four) times daily as needed.   olmesartan (BENICAR) 40 MG tablet Take 1 tablet (40 mg total) by mouth daily.   ondansetron (ZOFRAN ODT) 8 MG disintegrating tablet Take 1 tablet (8 mg total) by mouth every 8 (eight) hours as needed for nausea or vomiting.   pantoprazole (PROTONIX) 40 MG tablet Take 1 tablet (40 mg total) by mouth at bedtime.   rosuvastatin (CRESTOR) 40 MG tablet Take 1 tablet (40 mg total) by mouth daily.   sertraline (ZOLOFT) 100 MG tablet Take 1.5 tablets (150 mg total) by mouth daily.   tiZANidine (ZANAFLEX) 4 MG tablet TAKE 1 TABLET(4 MG) BY MOUTH EVERY 6 HOURS AS NEEDED FOR MUSCLE SPASMS   Vitamin D, Ergocalciferol, (DRISDOL) 1.25 MG (50000 UNIT) CAPS capsule Take 50,000 Units by mouth every 7 (seven) days.   montelukast (SINGULAIR) 10 MG tablet Take 1 tablet (10 mg total) by mouth at bedtime. (Patient not taking: Reported on 08/09/2022)   No facility-administered encounter medications on file as of 08/09/2022.  Allergies (verified) Accupril [quinapril hcl], Avelox [moxifloxacin], Ciprofloxacin, Hctz [hydrochlorothiazide], Penicillins, and Sulfa antibiotics   History: Past Medical History:  Diagnosis Date   Acute rhinosinusitis 07/23/2019   Chronic kidney disease    Depression    Failed back syndrome, lumbar    Hyperlipidemia    Hypertension    Osteoporosis    Weight loss    Past Surgical History:  Procedure Laterality Date   ABDOMINAL HYSTERECTOMY     CESAREAN SECTION     MOUTH SURGERY     SPINE SURGERY     Family History  Problem Relation Age of  Onset   Hypertension Mother    Dementia Mother    Cancer Father    Cancer Maternal Aunt        breast   Diabetes Neg Hx    Heart disease Neg Hx    Stroke Neg Hx    COPD Neg Hx    Social History   Socioeconomic History   Marital status: Divorced    Spouse name: Not on file   Number of children: Not on file   Years of education: 10   Highest education level: 10th grade  Occupational History   Occupation: disability  Tobacco Use   Smoking status: Every Day    Packs/day: 2.00    Types: Cigarettes   Smokeless tobacco: Never   Tobacco comments:    1PPD 03/01/2022  Vaping Use   Vaping Use: Never used  Substance and Sexual Activity   Alcohol use: No   Drug use: Not Currently    Types: Marijuana    Comment: last week estimated 05/10/2019   Sexual activity: Not on file  Other Topics Concern   Not on file  Social History Narrative   Not on file   Social Determinants of Health   Financial Resource Strain: Low Risk  (08/09/2022)   Overall Financial Resource Strain (CARDIA)    Difficulty of Paying Living Expenses: Not hard at all  Food Insecurity: No Food Insecurity (08/09/2022)   Hunger Vital Sign    Worried About Running Out of Food in the Last Year: Never true    Ran Out of Food in the Last Year: Never true  Transportation Needs: No Transportation Needs (08/09/2022)   PRAPARE - Hydrologist (Medical): No    Lack of Transportation (Non-Medical): No  Physical Activity: Inactive (08/09/2022)   Exercise Vital Sign    Days of Exercise per Week: 0 days    Minutes of Exercise per Session: 0 min  Stress: No Stress Concern Present (08/09/2022)   [place]    Feeling of Stress : Only a little  Social Connections: Socially Isolated (08/09/2022)   Social Connection and Isolation Panel [NHANES]    Frequency of Communication with Friends and Family: Never    Frequency of Social Gatherings  with Friends and Family: Never    Attends Religious Services: Never    Marine scientist or Organizations: No    Attends Music therapist: Never    Marital Status: Divorced    Tobacco Counseling Ready to quit: Not Answered Counseling given: Not Answered Tobacco comments: 1PPD 03/01/2022   Clinical Intake:  Pre-visit preparation completed: Yes  Pain : 0-10 Pain Score: 5  Pain Type: Chronic pain Pain Location: Back Pain Orientation: Lower Pain Radiating Towards: knot on left shoulder hurts going down arm     Nutritional Risks: None Diabetes: No  How often do you need to have someone help you when you read instructions, pamphlets, or other written materials from your doctor or pharmacy?: 1 - Never  Diabetic?no  Interpreter Needed?: No  Information entered by :: Kirke Shaggy, LPN   Activities of Daily Living    08/09/2022    1:39 PM  In your present state of health, do you have any difficulty performing the following activities:  Hearing? 0  Vision? 0  Difficulty concentrating or making decisions? 0  Walking or climbing stairs? 1  Dressing or bathing? 0  Doing errands, shopping? 0  Preparing Food and eating ? N  Using the Toilet? N  In the past six months, have you accidently leaked urine? N  Do you have problems with loss of bowel control? N  Managing your Medications? N  Managing your Finances? N  Housekeeping or managing your Housekeeping? Y    Patient Care Team: Venita Lick, NP as PCP - General (Nurse Practitioner) Kate Sable, MD as PCP - Cardiology (Cardiology) Vanita Ingles, RN as Registered Nurse ([place]) Lane Hacker, [facility] (Pharmacist)  Indicate any recent Medical Services you may have received from other than [facility] providers in the past year (date may be approximate).     Assessment:   This is a routine wellness examination for [place].  Hearing/Vision screen Hearing Screening - Comments:: No  aids Vision Screening - Comments:: Wears glasses- [employer]  Dietary issues and exercise activities discussed: Current Exercise Habits: The patient does not participate in regular exercise at present   Goals Addressed             This Visit's Progress    DIET - EAT MORE FRUITS AND VEGETABLES         Depression Screen    08/09/2022    1:35 PM 04/15/2022    9:53 AM 03/21/2022    3:57 PM 11/29/2021    3:42 PM 11/03/2021    2:01 PM 10/20/2021   11:18 AM 06/22/2021    9:14 AM  PHQ 2/9 Scores  PHQ - 2 Score '2 3 2 4 4 3 2  '$ PHQ- 9 Score '4 15 3 12 10 12 8    '$ Fall Risk    08/09/2022    1:39 PM 04/15/2022    9:53 AM 11/03/2021    2:01 PM 10/20/2021   11:17 AM 06/22/2021    9:08 AM  Fall Risk   Falls in the past year? 1 0 '1 1 1  '$ Number falls in past yr: 1 0 1 1 0  Injury with Fall? 0 0 0 1 0  Risk for fall due to : History of fall(s) No Fall Risks History of fall(s) History of fall(s)   Follow up Falls prevention discussed;Falls evaluation completed Falls evaluation completed Falls evaluation completed Falls evaluation completed Falls evaluation completed;Falls prevention discussed    FALL RISK PREVENTION PERTAINING TO THE HOME:  Any stairs in or around the home? Yes  If so, are there any without handrails? No  Home free of loose throw rugs in walkways, pet beds, electrical cords, etc? Yes  Adequate lighting in your home to reduce risk of falls? Yes   ASSISTIVE DEVICES UTILIZED TO PREVENT FALLS:  Life alert? No  Use of a cane, walker or w/c? Yes -cane Grab bars in the bathroom? No  Shower chair or bench in shower? No  Elevated toilet seat or a handicapped toilet? No    Cognitive Function:  08/09/2022    1:48 PM 06/12/2020    2:40 PM 06/10/2019    3:56 PM 05/03/2018    1:52 PM 04/14/2017    1:30 PM  6CIT Screen  What Year? 0 points 0 points 0 points 0 points 0 points  What month? 0 points 0 points 0 points 0 points 0 points  What time? 0 points 0 points 0 points 0  points 0 points  Count back from 20 0 points 0 points 0 points 0 points 0 points  Months in reverse 0 points 2 points 0 points 2 points 0 points  Repeat phrase 4 points 2 points 0 points 2 points 0 points  Total Score 4 points 4 points 0 points 4 points 0 points    Immunizations Immunization History  Administered Date(s) Administered   Fluad Quad(high Dose 65+) 03/23/2020, 05/07/2021, 04/06/2022   Influenza, High Dose Seasonal PF 04/14/2017   Influenza,inj,Quad PF,6+ Mos 03/31/2015, 02/10/2016, 03/30/2018, 06/10/2019   Influenza-Unspecified 03/11/2014   Pneumococcal Conjugate-13 03/23/2020   Pneumococcal Polysaccharide-23 05/07/2021   Pneumococcal-Unspecified 11/01/2007   Td 11/01/2007    TDAP status: Due, Education has been provided regarding the importance of this vaccine. Advised may receive this vaccine at local pharmacy or Health Dept. Aware to provide a copy of the vaccination record if obtained from local pharmacy or Health Dept. Verbalized acceptance and understanding.  Flu Vaccine status: Up to date  Pneumococcal vaccine status: Up to date  Covid-19 vaccine status: Declined, Education has been provided regarding the importance of this vaccine but patient still declined. Advised may receive this vaccine at local pharmacy or Health Dept.or vaccine clinic. Aware to provide a copy of the vaccination record if obtained from local pharmacy or Health Dept. Verbalized acceptance and understanding.  Qualifies for Shingles Vaccine? Yes   Zostavax completed No   Shingrix Completed?: No.    Education has been provided regarding the importance of this vaccine. Patient has been advised to call insurance company to determine out of pocket expense if they have not yet received this vaccine. Advised may also receive vaccine at local pharmacy or Health Dept. Verbalized acceptance and understanding.  Screening Tests Health Maintenance  Topic Date Due   COVID-19 Vaccine (1) Never done    Hepatitis C Screening  Never done   COLONOSCOPY (Pts 45-34yr Insurance coverage will need to be confirmed)  Never done   MAMMOGRAM  Never done   Zoster Vaccines- Shingrix (1 of 2) Never done   DTaP/Tdap/Td (2 - Tdap) 10/31/2017   DEXA SCAN  Never done   Medicare Annual Wellness (AWV)  08/09/2023   Pneumonia Vaccine 68 Years old  Completed   INFLUENZA VACCINE  Completed   HPV VACCINES  Aged Out    Health Maintenance  Health Maintenance Due  Topic Date Due   COVID-19 Vaccine (1) Never done   Hepatitis C Screening  Never done   COLONOSCOPY (Pts 45-478yrInsurance coverage will need to be confirmed)  Never done   MAMMOGRAM  Never done   Zoster Vaccines- Shingrix (1 of 2) Never done   DTaP/Tdap/Td (2 - Tdap) 10/31/2017   DEXA SCAN  Never done    Declined referrals for colonoscopy and mammogram and BDS    Lung Cancer Screening: (Low Dose CT Chest recommended if Age 68-80ears, 30 pack-year currently smoking OR have quit w/in 15years.) does qualify.   Lung Cancer Screening Referral: sent 08/09/22  Additional Screening:  Hepatitis C Screening: does qualify; Completed no  Vision Screening: Recommended  annual ophthalmology exams for early detection of glaucoma and other disorders of the eye. Is the patient up to date with their annual eye exam?  Yes  Who is the provider or what is the name of the office in which the patient attends annual eye exams? [facility] If pt is not established with a provider, would they like to be referred to a provider to establish care? No .   Dental Screening: Recommended annual dental exams for proper oral hygiene  Community Resource Referral / Chronic Care Management: CRR required this visit?  No   CCM required this visit?  No      Plan:     I have personally reviewed and noted the following in the patient's chart:   Medical and social history Use of alcohol, tobacco or illicit drugs  Current medications and supplements including opioid  prescriptions. Patient is not currently taking opioid prescriptions. Functional ability and status Nutritional status Physical activity Advanced directives List of other physicians Hospitalizations, surgeries, and ER visits in previous 12 months Vitals Screenings to include cognitive, depression, and falls Referrals and appointments  In addition, I have reviewed and discussed with patient certain preventive protocols, quality metrics, and best practice recommendations. A written personalized care plan for preventive services as well as general preventive health recommendations were provided to patient.     Dionisio David, LPN   [id number]   Nurse Notes: lung ca screening referral sent

## 2022-08-30 ENCOUNTER — Telehealth: Payer: Self-pay

## 2022-08-30 NOTE — Progress Notes (Cosign Needed)
Care Management & Coordination Services Pharmacy Team  Reason for Encounter: General adherence update   Contacted patient for general health update and medication adherence call.  Unsuccessful outreach. Left voicemail for patient to return call.   Recent office visits:  07/27/22-Jolene T. Harvest Dark, NP (PCP, Video Visit) Patient no showed. 04/15/22-Megan Holly Bodily, DO. Seen for nasal congestion and cough. Start on Azithromycin 250 mg and prednisone 20 mg. Return if symptoms worsen or fail to improve.  03/21/22-Jolene T. Harvest Dark, NP (PCP, Video Visit) Seen for upper respiratory infection. Labs ordered. Start on Zpack, Prednisone and Tussionex. Follow up in 3 months.  Recent consult visits:  03/01/22-Khabib Aundria Rud, MD (Pulmonology) Seen for shortness of breath. Stop taking Bupropion. Follow up in 3 months.   Hospital visits:  None in previous 6 months  Medications: Outpatient Encounter Medications as of 08/30/2022  Medication Sig   albuterol (VENTOLIN HFA) 108 (90 Base) MCG/ACT inhaler Inhale 2 puffs into the lungs every 6 (six) hours as needed for wheezing or shortness of breath.   amLODipine (NORVASC) 5 MG tablet Take 1 tablet (5 mg total) by mouth daily.   atorvastatin (LIPITOR) 40 MG tablet Take 40 mg by mouth daily.   buPROPion (WELLBUTRIN XL) 150 MG 24 hr tablet Take 1 tablet (150 mg total) by mouth daily.   carvedilol (COREG) 12.5 MG tablet Take 1 tablet (12.5 mg total) by mouth 2 (two) times daily.   Cholecalciferol 1.25 MG (50000 UT) TABS Take 1 tablet by mouth once a week.   Fluticasone-Umeclidin-Vilant (TRELEGY ELLIPTA) 100-62.5-25 MCG/ACT AEPB Inhale 1 puff into the lungs daily.   gabapentin (NEURONTIN) 300 MG capsule Take 1 capsule (300 mg total) by mouth 3 (three) times daily.   hydrOXYzine (VISTARIL) 25 MG capsule Take 1 capsule (25 mg total) by mouth every 8 (eight) hours as needed for anxiety.   megestrol (MEGACE) 40 MG tablet Take 1 tablet (40 mg total) by mouth 4 (four)  times daily as needed.   montelukast (SINGULAIR) 10 MG tablet Take 1 tablet (10 mg total) by mouth at bedtime. (Patient not taking: Reported on 08/09/2022)   olmesartan (BENICAR) 40 MG tablet Take 1 tablet (40 mg total) by mouth daily.   ondansetron (ZOFRAN ODT) 8 MG disintegrating tablet Take 1 tablet (8 mg total) by mouth every 8 (eight) hours as needed for nausea or vomiting.   pantoprazole (PROTONIX) 40 MG tablet Take 1 tablet (40 mg total) by mouth at bedtime.   rosuvastatin (CRESTOR) 40 MG tablet Take 1 tablet (40 mg total) by mouth daily.   sertraline (ZOLOFT) 100 MG tablet Take 1.5 tablets (150 mg total) by mouth daily.   tiZANidine (ZANAFLEX) 4 MG tablet TAKE 1 TABLET(4 MG) BY MOUTH EVERY 6 HOURS AS NEEDED FOR MUSCLE SPASMS   Vitamin D, Ergocalciferol, (DRISDOL) 1.25 MG (50000 UNIT) CAPS capsule Take 50,000 Units by mouth every 7 (seven) days.   No facility-administered encounter medications on file as of 08/30/2022.    Recent vitals BP Readings from Last 3 Encounters:  04/15/22 91/64  03/01/22 122/74  11/29/21 128/80   Pulse Readings from Last 3 Encounters:  04/15/22 73  03/01/22 83  11/29/21 68   Wt Readings from Last 3 Encounters:  08/09/22 132 lb (59.9 kg)  04/15/22 132 lb 3.2 oz (60 kg)  03/01/22 135 lb 3.2 oz (61.3 kg)   BMI Readings from Last 3 Encounters:  08/09/22 21.31 kg/m  04/15/22 21.34 kg/m  03/01/22 21.82 kg/m    Recent lab results  Component Value Date/Time   NA 143 04/06/2022 0851   K 4.0 04/06/2022 0851   CL 106 04/06/2022 0851   CO2 19 (L) 04/06/2022 0851   GLUCOSE 91 04/06/2022 0851   GLUCOSE 121 (H) 06/25/2018 1623   BUN 14 04/06/2022 0851   CREATININE 1.15 (H) 04/06/2022 0851   CALCIUM 9.4 04/06/2022 0851    Lab Results  Component Value Date   CREATININE 1.15 (H) 04/06/2022   EGFR 52 (L) 04/06/2022   GFRNONAA 50 (L) 04/30/2020   GFRAA 58 (L) 04/30/2020   No results found for: "HGBA1C", "FRUCTOSAMINE", "MICROALBUR"  Lab Results   Component Value Date   CHOL 134 04/06/2022   HDL 45 04/06/2022   LDLCALC 69 04/06/2022   TRIG 112 04/06/2022   CHOLHDL 2.3 12/15/2020   Care Gaps: Annual wellness visit in last year? Yes  If Diabetic: Last eye exam / retinopathy screening:N/a Last diabetic foot exam:N/a Last UACR: N/a  Star Rating Drugs:  Atorvastatin 40 mg Last filled:07/01/22 90 DS, 04/04/22 90 DS Olmesartan 40 mg Last filled:08/24/22 30 DS, 07/29/22 30 DS Rosuvastatin 40 mg Last filled:08/24/22 30 DS, 07/29/22 30 DS   Myriam Carolin Coy, RMA

## 2022-09-12 ENCOUNTER — Ambulatory Visit: Payer: Self-pay | Admitting: *Deleted

## 2022-09-12 NOTE — Telephone Encounter (Signed)
  Chief Complaint: Rash Symptoms: Painful blistered rash, right knee and foot. Started at [employer] of right foot, spreading to toes and up to knee."  Blistered at foot, "Bad" red "Bumps" at knee, itching, 8/10 pain "Can hardly stand it, walk." Reports subjective fever, sweating and chills since Sat but none presently. Frequency: Saturday Pertinent Negatives: Patient denies  Disposition: [] ED /[x] Urgent Care (no appt availability in office) / [] Appointment(In office/virtual)/ []  [place] [facility]/ [] Home Care/ [] Refused Recommended Disposition /[]  Mobile Bus/ []  Follow-up with PCP Additional Notes: Called practice, Laurelyn Sickle, for consult as no availability today. Advised UC. Pt states will follow disposition. Care advise provided, pt verbalizes understanding. Reason for Disposition  [1] Rash is painful to touch AND [2] fever  Answer Assessment - Initial Assessment Questions 1. APPEARANCE of RASH: "Describe the rash."      Foot,all blisters in line.  Red welts at knee 2. LOCATION: "Where is the rash located?"      Right leg at knee  Right foot "Ball, spreading" 3. NUMBER: "How many spots are there?"      any 4. SIZE: "How big are the spots?" (Inches, centimeters or compare to size of a coin)      Pencil eraser tip  5. ONSET: "When did the rash start?"      Saturday, mild, worsening Sunday 6. ITCHING: "Does the rash itch?" If Yes, ask: "How bad is the itch?"  (Scale 0-10; or none, mild, moderate, severe)     Knee itchy 7. PAIN: "Does the rash hurt?" If Yes, ask: "How bad is the pain?"  (Scale 0-10; or none, mild, moderate, severe)    - NONE (0): no pain    - MILD (1-3): doesn't interfere with normal activities     - MODERATE (4-7): interferes with normal activities or awakens from sleep     - SEVERE (8-10): excruciating pain, unable to do any normal activities     8/10 8. OTHER SYMPTOMS: "Do you have any other symptoms?" (e.g., fever)   Chills and sweating, none  today  Protocols used: Rash or Redness - Localized-A-AH

## 2022-09-15 ENCOUNTER — Ambulatory Visit: Payer: Medicare HMO | Admitting: Nurse Practitioner

## 2022-09-15 DIAGNOSIS — B029 Zoster without complications: Secondary | ICD-10-CM | POA: Diagnosis not present

## 2022-09-15 DIAGNOSIS — J0100 Acute maxillary sinusitis, unspecified: Secondary | ICD-10-CM | POA: Diagnosis not present

## 2022-09-29 ENCOUNTER — Other Ambulatory Visit: Payer: Self-pay | Admitting: Nurse Practitioner

## 2022-09-29 NOTE — Telephone Encounter (Signed)
Medication no longer listed on current medication list at this dose. Requested Prescriptions  Pending Prescriptions Disp Refills   atorvastatin (LIPITOR) 80 MG tablet [Pharmacy Med Name: ATORVASTATIN 80MG  TABLETS] 90 tablet 2    Sig: TAKE 1 TABLET(80 MG) BY MOUTH DAILY     Cardiovascular:  Antilipid - Statins Failed - 09/29/2022  6:26 AM      Failed - Lipid Panel in normal range within the last 12 months    Cholesterol, Total  Date Value Ref Range Status  04/06/2022 134 100 - 199 mg/dL Final   Cholesterol Piccolo, Waived  Date Value Ref Range Status  12/11/2017 163 <200 mg/dL Final    Comment:                            Desirable                <200                         Borderline High      200- 239                         High                     >239    LDL Chol Calc (NIH)  Date Value Ref Range Status  04/06/2022 69 0 - 99 mg/dL Final   HDL  Date Value Ref Range Status  04/06/2022 45 >39 mg/dL Final   Triglycerides  Date Value Ref Range Status  04/06/2022 112 0 - 149 mg/dL Final   Triglycerides Piccolo,Waived  Date Value Ref Range Status  12/11/2017 167 (H) <150 mg/dL Final    Comment:                            Normal                   <150                         Borderline High     150 - 199                         High                200 - 499                         Very High                >499          Passed - Patient is not pregnant      Passed - Valid encounter within last 12 months    Recent Outpatient Visits           2 months ago Centrilobular emphysema (HCC)   [place] [facility] [place], [place] T, NP   5 months ago COPD exacerbation (HCC)   [place] [facility] [place], Megan P, DO   6 months ago COPD exacerbation [facility])   [place] [facility] [place], [place] T, NP   10 months ago Centrilobular emphysema [facility])   [place] [facility] [place], Dorie Rank, NP   11  months ago  Anxiety    [facility] Loura Pardon, MD

## 2022-09-30 ENCOUNTER — Ambulatory Visit: Payer: Self-pay

## 2022-09-30 MED ORDER — TIZANIDINE HCL 4 MG PO TABS: ORAL_TABLET | ORAL | 3 refills | Status: DC

## 2022-09-30 NOTE — Telephone Encounter (Signed)
  Chief Complaint: Shingles rash to right foot and knee. Seen in UC and given antibiotic. Declines OV today . Asking for Zanaflex to be refilled for discomfort. Please advise pt. Symptoms: Above Frequency: 09/12/22 Pertinent Negatives: Patient denies fever Disposition: [] ED /[] Urgent Care (no appt availability in office) / [] Appointment(In office/virtual)/ []  [place] [facility]/ [] Home Care/ [] Refused Recommended Disposition /[] [place] Mobile Bus/ [x]  Follow-up with PCP Additional Notes: Please advise pt.

## 2022-09-30 NOTE — Telephone Encounter (Signed)
Answer Assessment - Initial Assessment Questions 1. APPEARANCE of RASH: "Describe the rash."      Red blisters 2. LOCATION: "Where is the rash located?"      Right foot and knee 3. NUMBER: "How many spots are there?"      Several 4. SIZE: "How big are the spots?" (Inches, centimeters or compare to size of a coin)      Coin size 5. ONSET: "When did the rash start?"      09/12/22 6. ITCHING: "Does the rash itch?" If Yes, ask: "How bad is the itch?"  (Scale 0-10; or none, mild, moderate, severe)     None 7. PAIN: "Does the rash hurt?" If Yes, ask: "How bad is the pain?"  (Scale 0-10; or none, mild, moderate, severe)    - NONE (0): no pain    - MILD (1-3): doesn't interfere with normal activities     - MODERATE (4-7): interferes with normal activities or awakens from sleep     - SEVERE (8-10): excruciating pain, unable to do any normal activities     Moderate 8. OTHER SYMPTOMS: "Do you have any other symptoms?" (e.g., fever)     No 9. PREGNANCY: "Is there any chance you are pregnant?" "When was your last menstrual period?"     No  Protocols used: Rash or Redness - Localized-A-AH

## 2022-10-03 ENCOUNTER — Telehealth: Payer: Self-pay

## 2022-10-03 NOTE — Progress Notes (Signed)
Care Management & Coordination Services Pharmacy Team  Reason for Encounter: General adherence update   Contacted patient for general health update and medication adherence call.  Unsuccessful outreach. Unable to leave voicemail.3 attempts to reach patient.     Chart Updates:  Recent office visits:  None since last coordination call  Recent consult visits:  None since last coordination call  Hospital visits:  None in previous 6 months  Medications: Outpatient Encounter Medications as of 10/03/2022  Medication Sig   albuterol (VENTOLIN HFA) 108 (90 Base) MCG/ACT inhaler Inhale 2 puffs into the lungs every 6 (six) hours as needed for wheezing or shortness of breath.   amLODipine (NORVASC) 5 MG tablet Take 1 tablet (5 mg total) by mouth daily.   atorvastatin (LIPITOR) 40 MG tablet Take 40 mg by mouth daily.   buPROPion (WELLBUTRIN XL) 150 MG 24 hr tablet Take 1 tablet (150 mg total) by mouth daily.   carvedilol (COREG) 12.5 MG tablet Take 1 tablet (12.5 mg total) by mouth 2 (two) times daily.   Cholecalciferol 1.25 MG (50000 UT) TABS Take 1 tablet by mouth once a week.   Fluticasone-Umeclidin-Vilant (TRELEGY ELLIPTA) 100-62.5-25 MCG/ACT AEPB Inhale 1 puff into the lungs daily.   gabapentin (NEURONTIN) 300 MG capsule Take 1 capsule (300 mg total) by mouth 3 (three) times daily.   hydrOXYzine (VISTARIL) 25 MG capsule Take 1 capsule (25 mg total) by mouth every 8 (eight) hours as needed for anxiety.   megestrol (MEGACE) 40 MG tablet Take 1 tablet (40 mg total) by mouth 4 (four) times daily as needed.   montelukast (SINGULAIR) 10 MG tablet Take 1 tablet (10 mg total) by mouth at bedtime. (Patient not taking: Reported on 08/09/2022)   olmesartan (BENICAR) 40 MG tablet Take 1 tablet (40 mg total) by mouth daily.   ondansetron (ZOFRAN ODT) 8 MG disintegrating tablet Take 1 tablet (8 mg total) by mouth every 8 (eight) hours as needed for nausea or vomiting.   pantoprazole (PROTONIX) 40 MG  tablet Take 1 tablet (40 mg total) by mouth at bedtime.   rosuvastatin (CRESTOR) 40 MG tablet Take 1 tablet (40 mg total) by mouth daily.   sertraline (ZOLOFT) 100 MG tablet Take 1.5 tablets (150 mg total) by mouth daily.   tiZANidine (ZANAFLEX) 4 MG tablet TAKE 1 TABLET(4 MG) BY MOUTH EVERY 6 HOURS AS NEEDED FOR MUSCLE SPASMS   Vitamin D, Ergocalciferol, (DRISDOL) 1.25 MG (50000 UNIT) CAPS capsule Take 50,000 Units by mouth every 7 (seven) days.   No facility-administered encounter medications on file as of 10/03/2022.    Recent vitals BP Readings from Last 3 Encounters:  04/15/22 91/64  03/01/22 122/74  11/29/21 128/80   Pulse Readings from Last 3 Encounters:  04/15/22 73  03/01/22 83  11/29/21 68   Wt Readings from Last 3 Encounters:  08/09/22 132 lb (59.9 kg)  04/15/22 132 lb 3.2 oz (60 kg)  03/01/22 135 lb 3.2 oz (61.3 kg)   BMI Readings from Last 3 Encounters:  08/09/22 21.31 kg/m  04/15/22 21.34 kg/m  03/01/22 21.82 kg/m    Recent lab results    Component Value Date/Time   NA 143 04/06/2022 0851   K 4.0 04/06/2022 0851   CL 106 04/06/2022 0851   CO2 19 (L) 04/06/2022 0851   GLUCOSE 91 04/06/2022 0851   GLUCOSE 121 (H) 06/25/2018 1623   BUN 14 04/06/2022 0851   CREATININE 1.15 (H) 04/06/2022 0851   CALCIUM 9.4 04/06/2022 0851  Lab Results  Component Value Date   CREATININE 1.15 (H) 04/06/2022   EGFR 52 (L) 04/06/2022   GFRNONAA 50 (L) 04/30/2020   GFRAA 58 (L) 04/30/2020   No results found for: "HGBA1C", "FRUCTOSAMINE", "MICROALBUR"  Lab Results  Component Value Date   CHOL 134 04/06/2022   HDL 45 04/06/2022   LDLCALC 69 04/06/2022   TRIG 112 04/06/2022   CHOLHDL 2.3 12/15/2020    Care Gaps: Annual wellness visit in last year? Yes, 06/22/21  If Diabetic: Last eye exam / retinopathy screening:N/a Last diabetic foot exam:N/a Last UACR: N/a  Star Rating Drugs:  Medication:  Last Fill: Day Supply Atorvastatin 40 mg Last filled:07/01/22 90 DS,  04/04/22 90 DS Olmesartan 40 mg Last filled:08/24/22 30 DS, 07/29/22 30 DS Rosuvastatin 40 mg Last filled:08/24/22 30 DS, 07/29/22 30 DS   Velvet Bathe

## 2022-10-14 ENCOUNTER — Encounter: Payer: Self-pay | Admitting: Nurse Practitioner

## 2022-10-14 ENCOUNTER — Ambulatory Visit (INDEPENDENT_AMBULATORY_CARE_PROVIDER_SITE_OTHER): Payer: Medicare HMO | Admitting: Nurse Practitioner

## 2022-10-14 VITALS — BP 123/83 | HR 87 | Temp 98.0°F | Ht 65.98 in | Wt 136.8 lb

## 2022-10-14 DIAGNOSIS — I10 Essential (primary) hypertension: Secondary | ICD-10-CM | POA: Diagnosis not present

## 2022-10-14 DIAGNOSIS — G894 Chronic pain syndrome: Secondary | ICD-10-CM | POA: Diagnosis not present

## 2022-10-14 DIAGNOSIS — R7989 Other specified abnormal findings of blood chemistry: Secondary | ICD-10-CM

## 2022-10-14 DIAGNOSIS — E559 Vitamin D deficiency, unspecified: Secondary | ICD-10-CM

## 2022-10-14 DIAGNOSIS — E782 Mixed hyperlipidemia: Secondary | ICD-10-CM

## 2022-10-14 DIAGNOSIS — J432 Centrilobular emphysema: Secondary | ICD-10-CM | POA: Diagnosis not present

## 2022-10-14 DIAGNOSIS — I700 Atherosclerosis of aorta: Secondary | ICD-10-CM

## 2022-10-14 DIAGNOSIS — F339 Major depressive disorder, recurrent, unspecified: Secondary | ICD-10-CM | POA: Diagnosis not present

## 2022-10-14 DIAGNOSIS — F419 Anxiety disorder, unspecified: Secondary | ICD-10-CM

## 2022-10-14 DIAGNOSIS — B0229 Other postherpetic nervous system involvement: Secondary | ICD-10-CM | POA: Insufficient documentation

## 2022-10-14 MED ORDER — TIZANIDINE HCL 4 MG PO TABS: ORAL_TABLET | ORAL | 3 refills | Status: DC

## 2022-10-14 MED ORDER — VITAMIN D (ERGOCALCIFEROL) 1.25 MG (50000 UNIT) PO CAPS: 50000.0000 [IU] | ORAL_CAPSULE | ORAL | 4 refills | Status: DC

## 2022-10-14 MED ORDER — GABAPENTIN 600 MG PO TABS: 600.0000 mg | ORAL_TABLET | Freq: Three times a day (TID) | ORAL | 4 refills | Status: DC

## 2022-10-14 MED ORDER — TRIAMCINOLONE ACETONIDE 0.1 % EX CREA: 1.0000 | TOPICAL_CREAM | Freq: Two times a day (BID) | CUTANEOUS | 0 refills | Status: DC

## 2022-10-14 NOTE — Assessment & Plan Note (Signed)
Chronic, ongoing.  Continue Rosuvastatin 40 MG daily, improved leg cramps.  Recommend taking Magnesium at night for leg cramps and sleep.  Labs today, is fasting.

## 2022-10-14 NOTE — Assessment & Plan Note (Signed)
Chronic, ongoing.  Will place referral to Dr. Welton Flakes locally for pain management discussion.  Advised her not to obtain opioids off the street due to risks.

## 2022-10-14 NOTE — Assessment & Plan Note (Signed)
Chronic, ongoing.  Recheck today and continue supplement.

## 2022-10-14 NOTE — Assessment & Plan Note (Signed)
Chronic, stable. Denies SI/HI.  To be followed by CCM team.  Continue Wellbutrin and Zoloft which are offering her benefit.  Return in 6 months.

## 2022-10-14 NOTE — Assessment & Plan Note (Signed)
Chronic, stable.  BP at goal in office today.  Recommend she monitor BP at least a few mornings a week at home and document.  DASH diet at home.  Due to her COPD continue Olmesartan to allow for ongoing kidney protection, but less affect on COPD.  Labs: CBC, CMP, TSH.  Continue collaboration with cardiology as needed.  Recommend complete cessation of smoking.

## 2022-10-14 NOTE — Assessment & Plan Note (Signed)
Chronic, ongoing.  Noted on CT imaging in past, at this time recommend complete cessation of smoking and will continue Atorvastatin for prevention.  Lipid panel today, is fasting.

## 2022-10-14 NOTE — Assessment & Plan Note (Signed)
Refer to depression plan of care for further. 

## 2022-10-14 NOTE — Progress Notes (Signed)
BP 123/83   Pulse 87   Temp 98 F (36.7 C) (Oral)   Ht 5' 5.98" (1.676 m)   Wt 136 lb 12.8 oz (62.1 kg)   SpO2 98%   BMI 22.09 kg/m    Subjective:    Patient ID: Caitlyn May, female    DOB: 03-31-1956, 67 y.o.   MRN: [id number]  HPI: Caitlyn May is a 67 y.o. female  Chief Complaint  Patient presents with   Rash    On right lower extremity, went to urgent care, patient states that she was given an abx for shingles.    HYPERTENSION / HYPERLIPIDEMIA Current medications: Olmesartan, Carvedilol, Amlodipine, and Rosuvastatin.  Atorvastatin caused her leg cramps in past.  History of elevation in TSH, recent levels stable. Satisfied with current treatment? yes Duration of hypertension: chronic BP monitoring frequency: daily BP range: 110-120/70-80 BP medication side effects: no Duration of hyperlipidemia: chronic Cholesterol medication side effects: no Cholesterol supplements: none Medication compliance: good compliance Aspirin: no Recent stressors: no Recurrent headaches: no Visual changes: no Palpitations: no Dyspnea: yes at baseline Chest pain: no Lower extremity edema: no Dizzy/lightheaded: no  The 10-year ASCVD risk score (Arnett DK, et al., 2019) is: 14.4%   Values used to calculate the score:     Age: 4 years     Sex: Female     Is Non-Hispanic African American: No     Diabetic: No     Tobacco smoker: Yes     Systolic Blood Pressure: 123 mmHg     Is BP treated: Yes     HDL Cholesterol: 45 mg/dL     Total Cholesterol: 134 mg/dL  COPD Using Trelegy and Albuterol daily.  Last lung CT on 08/15/2019 which noted aortic atherosclerosis and emphysema, is to have annually -- does not wish to return for this right now.  Started smoking at age 27-14 years.  Currently is cutting back, was a 2 PPD smoker and now is at <1 PPD.   COPD status: stable Satisfied with current treatment?: yes Oxygen use: no Dyspnea frequency: at baseline Cough frequency: at  baseline Rescue inhaler frequency:  once a day Limitation of activity: no Productive cough: none Last Spirometry: unknown Pneumovax: Up to Date Influenza: Up to Date   CHRONIC KIDNEY DISEASE CKD status: stable Medications renally dose: yes Previous renal evaluation: no Pneumovax:  Up to Date Influenza Vaccine:  Up to Date  RASH Reports she went to urgent care, not noted in chart [employer]), and was diagnosed with shingles which she says they gave her antibiotic for.  Shingles to right foot, leg. Reports continue to have some pain to area.  Does not remember what they put her on.  First presented 3 weeks ago.  States she is going to get Oxycontin off the street to help the pain.  Discussed going to pain clinic, but she says they do not help her around here. Duration:  weeks  Location: legs right side and right foot Itching: no Burning: yes Redness: yes Oozing: no Scaling: no Blisters: no Painful: yes Fevers: no Change in detergents/soaps/personal care products: no Recent illness: Valtrex Recent travel:no History of same: no Context: fluctuating Alleviating factors: nothing Treatments attempted: antibiotics Shortness of breath: no  Throat/tongue swelling: no Myalgias/arthralgias: no   DEPRESSION Taking Zoloft and Wellbutrin XL.  Continue on Gabapentin 300 MG TID for chronic pain due to failed back surgery syndrome. Mood status: stable Satisfied with current treatment?: yes Symptom severity: moderate  Duration  of current treatment : chronic Side effects: no Medication compliance: good compliance Psychotherapy/counseling: yes in the past Previous psychiatric medications: Xanax Depressed mood: yes Anxious mood: yes Anhedonia: no Significant weight loss or gain: no Insomnia: yes hard to stay asleep Fatigue: yes Feelings of worthlessness or guilt: no Impaired concentration/indecisiveness: no Suicidal ideations: no Hopelessness: no Crying spells: no     10/14/2022    1:30 PM 08/09/2022    1:35 PM 04/15/2022    9:53 AM 03/21/2022    3:57 PM 11/29/2021    3:42 PM  Depression screen PHQ 2/9  Decreased Interest 2 1 2 1 2   Down, Depressed, Hopeless 1 1 1 1 2   PHQ - 2 Score 3 2 3 2 4   Altered sleeping 2 1 3  0 2  Tired, decreased energy 1 1 3  0 3  Change in appetite 0  2 1 1   Feeling bad or failure about yourself  1  2 0 1  Trouble concentrating 0  1 0 1  Moving slowly or fidgety/restless 0  1 0 0  Suicidal thoughts 0  0 0 0  PHQ-9 Score 7 4 15 3 12   Difficult doing work/chores Somewhat difficult Not difficult at all Somewhat difficult Not difficult at all Somewhat difficult       10/14/2022    1:30 PM 04/15/2022    9:54 AM 03/21/2022    3:58 PM 11/29/2021    3:42 PM  GAD 7 : Generalized Anxiety Score  Nervous, Anxious, on Edge 1 2 1 2   Control/stop worrying 1 2 1 3   Worry too much - different things 1 2 1 3   Trouble relaxing 0 2 0 3  Restless 0 1 0 3  Easily annoyed or irritable 1 1 1 3   Afraid - awful might happen 0 0 1 1  Total GAD 7 Score 4 10 5 18   Anxiety Difficulty Not difficult at all Somewhat difficult Somewhat difficult Very difficult     Relevant past medical, surgical, family and social history reviewed and updated as indicated. Interim medical history since our last visit reviewed. Allergies and medications reviewed and updated.  Review of Systems  Constitutional:  Negative for activity change, appetite change, diaphoresis, fatigue and fever.  HENT:  Negative for ear discharge and ear pain.   Respiratory:  Negative for cough, chest tightness and shortness of breath.   Cardiovascular:  Negative for chest pain, palpitations and leg swelling.  Gastrointestinal: Negative.   Endocrine: Negative.   Musculoskeletal:  Positive for arthralgias.  Skin:  Positive for rash.  Neurological: Negative.   Psychiatric/Behavioral:  Positive for sleep disturbance. Negative for decreased concentration, self-injury and suicidal ideas.  The patient is nervous/anxious.    Per HPI unless specifically indicated above     Objective:    BP 123/83   Pulse 87   Temp 98 F (36.7 C) (Oral)   Ht 5' 5.98" (1.676 m)   Wt 136 lb 12.8 oz (62.1 kg)   SpO2 98%   BMI 22.09 kg/m   Wt Readings from Last 3 Encounters:  10/14/22 136 lb 12.8 oz (62.1 kg)  08/09/22 132 lb (59.9 kg)  04/15/22 132 lb 3.2 oz (60 kg)    Physical Exam Vitals and nursing note reviewed.  Constitutional:      General: She is awake. She is not in acute distress.    Appearance: She is well-developed and well-groomed. She is not ill-appearing or toxic-appearing.  HENT:     Head: Normocephalic.  Right Ear: Hearing and external ear normal.     Left Ear: Hearing and external ear normal.     Nose: Nose normal.     Mouth/Throat:     Mouth: Mucous membranes are moist.  Eyes:     General: Lids are normal.        Right eye: No discharge.        Left eye: No discharge.     Conjunctiva/sclera: Conjunctivae normal.     Pupils: Pupils are equal, round, and reactive to light.  Neck:     Thyroid: No thyromegaly.     Vascular: No carotid bruit.  Cardiovascular:     Rate and Rhythm: Normal rate and regular rhythm.     Heart sounds: Normal heart sounds. No murmur heard.    No gallop.  Pulmonary:     Effort: Pulmonary effort is normal. No accessory muscle usage or respiratory distress.     Breath sounds: Normal breath sounds.  Abdominal:     General: Bowel sounds are normal.     Palpations: Abdomen is soft.  Musculoskeletal:     Cervical back: Normal range of motion and neck supple.     Right lower leg: No edema.     Left lower leg: No edema.  Lymphadenopathy:     Cervical: No cervical adenopathy.  Skin:    General: Skin is warm and dry.     Findings: Rash present.     Comments: Dried up and crusted over rash noted to right foot lower and upper aspect.  Cluster pattern in appearance.  Neurological:     Mental Status: She is alert and oriented to  person, place, and time.  Psychiatric:        Attention and Perception: Attention normal.        Mood and Affect: Mood normal.        Speech: Speech normal.        Behavior: Behavior normal. Behavior is cooperative.        Thought Content: Thought content normal.    Results for orders placed or performed in visit on 04/06/22  T4, free  Result Value Ref Range   Free T4 1.07 0.82 - 1.77 ng/dL  TSH  Result Value Ref Range   TSH 3.260 0.450 - 4.500 uIU/mL  VITAMIN D 25 Hydroxy (Vit-D Deficiency, Fractures)  Result Value Ref Range   Vit D, 25-Hydroxy 10.7 (L) 30.0 - 100.0 ng/mL  Lipid Panel w/o Chol/HDL Ratio  Result Value Ref Range   Cholesterol, Total 134 100 - 199 mg/dL   Triglycerides 782 0 - 149 mg/dL   HDL 45 >95 mg/dL   VLDL Cholesterol Cal 20 5 - 40 mg/dL   LDL Chol Calc (NIH) 69 0 - 99 mg/dL  Comprehensive metabolic panel  Result Value Ref Range   Glucose 91 70 - 99 mg/dL   BUN 14 8 - 27 mg/dL   Creatinine, Ser 6.21 (H) 0.57 - 1.00 mg/dL   eGFR 52 (L) >30 QM/VHQ/4.69   BUN/Creatinine Ratio 12 12 - 28   Sodium 143 134 - 144 mmol/L   Potassium 4.0 3.5 - 5.2 mmol/L   Chloride 106 96 - 106 mmol/L   CO2 19 (L) 20 - 29 mmol/L   Calcium 9.4 8.7 - 10.3 mg/dL   Total Protein 6.4 6.0 - 8.5 g/dL   Albumin 4.1 3.9 - 4.9 g/dL   Globulin, Total 2.3 1.5 - 4.5 g/dL   Albumin/Globulin Ratio 1.8 1.2 - 2.2  Bilirubin Total 0.2 0.0 - 1.2 mg/dL   Alkaline Phosphatase 96 44 - 121 IU/L   AST 14 0 - 40 IU/L   ALT 14 0 - 32 IU/L  CBC with Differential/Platelet  Result Value Ref Range   WBC 8.2 3.4 - 10.8 x10E3/uL   RBC 4.77 3.77 - 5.28 x10E6/uL   Hemoglobin 14.4 11.1 - 15.9 g/dL   Hematocrit 28.4 13.2 - 46.6 %   MCV 94 79 - 97 fL   MCH 30.2 26.6 - 33.0 pg   MCHC 32.1 31.5 - 35.7 g/dL   RDW 44.0 10.2 - 72.5 %   Platelets 238 150 - 450 x10E3/uL   Neutrophils 57 Not Estab. %   Lymphs 31 Not Estab. %   Monocytes 8 Not Estab. %   Eos 2 Not Estab. %   Basos 1 Not Estab. %    Neutrophils Absolute 4.7 1.4 - 7.0 x10E3/uL   Lymphocytes Absolute 2.5 0.7 - 3.1 x10E3/uL   Monocytes Absolute 0.7 0.1 - 0.9 x10E3/uL   EOS (ABSOLUTE) 0.2 0.0 - 0.4 x10E3/uL   Basophils Absolute 0.1 0.0 - 0.2 x10E3/uL   Immature Granulocytes 1 Not Estab. %   Immature Grans (Abs) 0.1 0.0 - 0.1 x10E3/uL      Assessment & Plan:   Problem List Items Addressed This Visit       Cardiovascular and Mediastinum   Aortic atherosclerosis (HCC)    Chronic, ongoing.  Noted on CT imaging in past, at this time recommend complete cessation of smoking and will continue Atorvastatin for prevention.  Lipid panel today, is fasting.      Hypertension    Chronic, stable.  BP at goal in office today.  Recommend she monitor BP at least a few mornings a week at home and document.  DASH diet at home.  Due to her COPD continue Olmesartan to allow for ongoing kidney protection, but less affect on COPD.  Labs: CBC, CMP, TSH.  Continue collaboration with cardiology as needed.  Recommend complete cessation of smoking.       Relevant Orders   CBC with Differential/Platelet     Respiratory   Centrilobular emphysema (HCC) - Primary    Chronic, stable. Continue Trelegy and Albuterol, Trelegy offering benefit.  Pulmonary referral in place, but did not attend. ?need for O2 with activity in future.  Continue annual lung screening, has missed some years due to cost at this time - recommend she schedule.  Recommend complete cessation of smoking.        Relevant Orders   CBC with Differential/Platelet     Nervous and Auditory   Postherpetic neuralgia    New onset post shingles reported 3 weeks ago, no documentation from urgent care visit, but does have crusted rash to foot area.  Will send in Triamcinolone cream to place on rash.  Current eGFR >50.  Max Gabapentin dosing 1800 MG daily can be used.  Will increase Gabapentin to 600 MG TID, will be at max.  Discussed with patient.  Recommend she not obtain opioid  medications off the street due to risks and discussed with her that chronic pain management is not performed in this setting.  Will place referral for pain management.      Relevant Orders   Ambulatory referral to Pain Clinic     Other   Chronic pain syndrome (Chronic)    Chronic, ongoing.  Will place referral to Dr. Welton Flakes locally for pain management discussion.  Advised her not to obtain  opioids off the street due to risks.      Relevant Medications   tiZANidine (ZANAFLEX) 4 MG tablet   gabapentin (NEURONTIN) 600 MG tablet   Other Relevant Orders   Ambulatory referral to Pain Clinic   Anxiety    Refer to depression plan of care for further.      Depression, recurrent (HCC)    Chronic, stable. Denies SI/HI.  To be followed by CCM team.  Continue Wellbutrin and Zoloft which are offering her benefit.  Return in 6 months.      Elevated TSH    Recheck labs today, denies current symptoms.      Relevant Orders   TSH   T4, free   Hyperlipidemia    Chronic, ongoing.  Continue Rosuvastatin 40 MG daily, improved leg cramps.  Recommend taking Magnesium at night for leg cramps and sleep.  Labs today, is fasting.       Relevant Orders   Comprehensive metabolic panel   Lipid Panel w/o Chol/HDL Ratio   Vitamin D deficiency    Chronic, ongoing.  Recheck today and continue supplement.      Relevant Orders   VITAMIN D 25 Hydroxy (Vit-D Deficiency, Fractures)     Follow up plan: Return in about 6 months (around 04/16/2023).

## 2022-10-14 NOTE — Assessment & Plan Note (Signed)
Recheck labs today, denies current symptoms.

## 2022-10-14 NOTE — Assessment & Plan Note (Signed)
Chronic, stable. Continue Trelegy and Albuterol, Trelegy offering benefit.  Pulmonary referral in place, but did not attend. ?need for O2 with activity in future.  Continue annual lung screening, has missed some years due to cost at this time - recommend she schedule.  Recommend complete cessation of smoking.

## 2022-10-14 NOTE — Patient Instructions (Signed)
Living with COPD Being diagnosed with chronic obstructive pulmonary disease (COPD) changes your life physically and emotionally. Having COPD can affect your ability to work and do things you enjoy. COPD is not the same for everyone, and it may change over time. Your health care providers can help you come up with the COPD management plan that works best for you. How to manage lifestyle changes Treatment plan Work closely with your health care providers. Follow your COPD management plan. This plan includes: Instructions about activities, exercises, diet, medicines, what to do when COPD flares up, and when to call your health care provider. A pulmonary rehabilitation program. In pulmonary rehab, you will learn about COPD, do exercises for fitness and breathing, and get support from health care providers and other people who have COPD. Managing emotions and stress Living with a chronic disease means you may also struggle with stressful emotions, such as sadness, fear, and worry. Here are some ways to manage these emotions: Talk to someone about your fear, anxiety, depression, or stress. Learn strategies to avoid or reduce stress and ask for help if you are struggling with depression or anxiety. Consider joining a COPD support group, online or in person.  Adjusting to changes COPD may limit the things you can do, but you can make certain changes to help you cope with the diagnosis. Ask for help when you need it. Getting support from friends, family, and your health care team is an important part of managing the condition. Try to get regular exercise as prescribed by a health care provider or pulmonary rehab team. Exercising can help COPD, even if you are a bit short of breath. Take steps to prevent infection and protect your lungs: Wash your hands often and avoid being in crowds. Stay away from friends and family members who are sick. Check your local air quality each day, and stay out of areas  where air pollution is likely. How to recognize changes in your condition Recognizing changes in your COPD COPD is a progressive disease. It is important to let the health care team know if your COPD is getting worse. Your treatment plan may need to change. Watch for: Increased shortness of breath, wheezing, cough, or fatigue. Loss of ability to exercise or perform daily activities, like climbing stairs. More frequent symptom flares. Signs of depression or anxiety. Recognizing stress It is normal to have additional stress when you have COPD. However, prolonged stress and anxiety can make COPD worse and lead to depression. Recognize the warning signs, which include: Feeling sad or worried more often or most of the time. Having less energy and losing interest in pleasurable activities. Changes in your appetite or sleeping patterns. Being easily angered or irritated. Having unexplained aches and pains, digestive problems, or headaches. Follow these instructions at home: Eating and drinking  Eat foods that are high in fiber, such as fresh fruits and vegetables, whole grains, and beans. Limit foods that are high in fat and processed sugars, such as fried or sweet foods. Follow a balanced diet and maintain a healthy weight. Being overweight or underweight can make COPD worse. You may work with a dietitian as part of your pulmonary rehab program. Drink enough fluid to keep your urine pale yellow. If you drink alcohol: Limit how much you have to: 0-1 drink a day for women who are not pregnant. 0-2 drinks a day for men. Know how much alcohol is in your drink. In the [place] one drink equals one 12 oz bottle   of beer (355 mL), one 5 oz glass of wine (148 mL), or one 1 oz glass of hard liquor (44 mL). Lifestyle If you smoke, the most important thing that you can do is to stop smoking. Continuing to smoke will cause the disease to progress faster. Do not use any products that contain nicotine or  tobacco. These products include cigarettes, chewing tobacco, and vaping devices, such as e-cigarettes. If you need help quitting, ask your health care provider. Avoid exposure to things that irritate your lungs, such as smoke, chemicals, and fumes. Activity Balance exercise and rest. Take short walks every 1-2 hours. This is important to improve blood flow and breathing. Ask for help if you feel weak or unsteady. Do exercises that include controlled breathing with body movement, such as tai chi. General instructions Take over-the-counter and prescription medicines only as told by your health care provider. Take vitamin and protein supplements as told by your health care provider or dietitian. Practice good oral hygiene and see your dental care provider regularly. An oral infection can also spread to your lungs. Make sure you receive all the vaccines that your health care provider recommends. Keep all follow-up visits. This is important. Contact a health care provider if you: Are struggling to manage your COPD. Have emotional stress that interferes with your ability to cope with COPD. Get help right away if you: Have thoughts of suicide, death, or hurting yourself or others. If you ever feel like you may hurt yourself or others, or have thoughts about taking your own life, get help right away. Go to your nearest emergency department or: Call your local emergency services (911 in the [place] Call a suicide crisis helpline, such as the National Suicide Prevention Lifeline at 1-800-273-8255 or 988 in the [place] This is open 24 hours a day in the [place] Text the Crisis Text Line at 741741 (in the [place] Summary Being diagnosed with chronic obstructive pulmonary disease (COPD) changes your life physically and emotionally. Work with your health care providers and follow your COPD management plan. A pulmonary rehabilitation program is an important part of COPD management. Prolonged stress, anxiety, and  depression can make COPD worse. Let your health care provider know if emotional stress interferes with your ability to cope with and manage COPD. This information is not intended to replace advice given to you by your health care provider. Make sure you discuss any questions you have with your health care provider. Document Revised: 12/09/2020 Document Reviewed: 06/03/2020 Elsevier Patient Education  2023 Elsevier Inc.  

## 2022-10-14 NOTE — Assessment & Plan Note (Signed)
New onset post shingles reported 3 weeks ago, no documentation from urgent care visit, but does have crusted rash to foot area.  Will send in Triamcinolone cream to place on rash.  Current eGFR >50.  Max Gabapentin dosing 1800 MG daily can be used.  Will increase Gabapentin to 600 MG TID, will be at max.  Discussed with patient.  Recommend she not obtain opioid medications off the street due to risks and discussed with her that chronic pain management is not performed in this setting.  Will place referral for pain management.

## 2022-10-15 LAB — CBC WITH DIFFERENTIAL/PLATELET
Basophils Absolute: 0 10*3/uL (ref 0.0–0.2)
Basos: 1 %
EOS (ABSOLUTE): 0.2 10*3/uL (ref 0.0–0.4)
Eos: 3 %
Hematocrit: 42.6 % (ref 34.0–46.6)
Hemoglobin: 14.4 g/dL (ref 11.1–15.9)
Immature Grans (Abs): 0 10*3/uL (ref 0.0–0.1)
Immature Granulocytes: 0 %
Lymphocytes Absolute: 2.1 10*3/uL (ref 0.7–3.1)
Lymphs: 30 %
MCH: 30.7 pg (ref 26.6–33.0)
MCHC: 33.8 g/dL (ref 31.5–35.7)
MCV: 91 fL (ref 79–97)
Monocytes Absolute: 0.6 10*3/uL (ref 0.1–0.9)
Monocytes: 9 %
Neutrophils Absolute: 4 10*3/uL (ref 1.4–7.0)
Neutrophils: 57 %
Platelets: 274 10*3/uL (ref 150–450)
RBC: 4.69 x10E6/uL (ref 3.77–5.28)
RDW: 12.4 % (ref 11.7–15.4)
WBC: 7 10*3/uL (ref 3.4–10.8)

## 2022-10-15 LAB — COMPREHENSIVE METABOLIC PANEL
ALT: 19 IU/L (ref 0–32)
AST: 19 IU/L (ref 0–40)
Albumin/Globulin Ratio: 2 (ref 1.2–2.2)
Albumin: 4.3 g/dL (ref 3.9–4.9)
Alkaline Phosphatase: 81 IU/L (ref 44–121)
BUN/Creatinine Ratio: 11 — ABNORMAL LOW (ref 12–28)
BUN: 11 mg/dL (ref 8–27)
Bilirubin Total: 0.2 mg/dL (ref 0.0–1.2)
CO2: 20 mmol/L (ref 20–29)
Calcium: 8.9 mg/dL (ref 8.7–10.3)
Chloride: 108 mmol/L — ABNORMAL HIGH (ref 96–106)
Creatinine, Ser: 1.04 mg/dL — ABNORMAL HIGH (ref 0.57–1.00)
Globulin, Total: 2.2 g/dL (ref 1.5–4.5)
Glucose: 93 mg/dL (ref 70–99)
Potassium: 4.1 mmol/L (ref 3.5–5.2)
Sodium: 143 mmol/L (ref 134–144)
Total Protein: 6.5 g/dL (ref 6.0–8.5)
eGFR: 59 mL/min/{1.73_m2} — ABNORMAL LOW (ref >59)

## 2022-10-15 LAB — T4, FREE: Free T4: 1.21 ng/dL (ref 0.82–1.77)

## 2022-10-15 LAB — LIPID PANEL W/O CHOL/HDL RATIO
Cholesterol, Total: 146 mg/dL (ref 100–199)
HDL: 54 mg/dL (ref >39)
LDL Chol Calc (NIH): 72 mg/dL (ref 0–99)
Triglycerides: 108 mg/dL (ref 0–149)
VLDL Cholesterol Cal: 20 mg/dL (ref 5–40)

## 2022-10-15 LAB — TSH: TSH: 4.47 u[IU]/mL (ref 0.450–4.500)

## 2022-10-15 LAB — VITAMIN D 25 HYDROXY (VIT D DEFICIENCY, FRACTURES): Vit D, 25-Hydroxy: 86.8 ng/mL (ref 30.0–100.0)

## 2022-10-15 NOTE — Progress Notes (Signed)
Good morning, please let Caitlyn May know her labs have returned and overall everything remains stable with no medication changes needed.  Kidney function continues to show some mild stage 3a kidney disease, but this has not worsened.  Continue to ensure good water intake at home and avoid Ibuprofen products which can affect kidneys.  Any questions? Keep being awesome!!  Thank you for allowing me to participate in your care.  I appreciate you. Kindest regards, Linzy Laury

## 2022-10-17 ENCOUNTER — Telehealth: Payer: Self-pay

## 2022-10-17 NOTE — Telephone Encounter (Signed)
Patient returned our call for lab results.  Marjie Skiff, NP 10/15/2022 12:56 PM EDT     Good morning, please let Kerryanne know her labs have returned and overall everything remains stable with no medication changes needed.  Kidney function continues to show some mild stage 3a kidney disease, but this has not worsened.  Continue to ensure good water intake at home and avoid Ibuprofen products which can affect kidneys.  Any questions? Keep being awesome!!  Thank you for allowing me to participate in your care.  I appreciate you. Kindest regards, Jolene   No questions at this time.

## 2022-10-18 ENCOUNTER — Other Ambulatory Visit: Payer: Self-pay | Admitting: Nurse Practitioner

## 2022-10-19 NOTE — Telephone Encounter (Signed)
Requested Prescriptions  Pending Prescriptions Disp Refills   hydrOXYzine (VISTARIL) 25 MG capsule [Pharmacy Med Name: hydroxyzine pamoate 25 mg capsule] 270 capsule 1    Sig: TAKE ONE CAPSULE BY MOUTH EVERY 8 HOURS AS NEEDED FOR ANXIETY     Ear, Nose, and Throat:  Antihistamines 2 Failed - 10/18/2022  3:22 PM      Failed - Cr in normal range and within 360 days    Creatinine, Ser  Date Value Ref Range Status  10/14/2022 1.04 (H) 0.57 - 1.00 mg/dL Final         Passed - Valid encounter within last 12 months    Recent Outpatient Visits           5 days ago Centrilobular emphysema (HCC)   [place] [facility] [place], Corrie Dandy T, NP   2 months ago Centrilobular emphysema (HCC)   [place] [facility] [place], Corrie Dandy T, NP   6 months ago COPD exacerbation [facility])   [place] [facility] [place], Megan P, DO   7 months ago COPD exacerbation [facility])   [place] [facility] [place], Corrie Dandy T, NP   10 months ago Centrilobular emphysema [facility])   [place] [name] [place], Dorie Rank, NP       Future Appointments             In 6 months Cannady, Dorie Rank, NP [facility], [facility]

## 2022-10-29 ENCOUNTER — Other Ambulatory Visit: Payer: Self-pay | Admitting: Nurse Practitioner

## 2022-10-31 NOTE — Telephone Encounter (Signed)
Unable to refill per protocol, Rx request is too soon. Last refill 12/28/21 for 90 and 4 refills.  Requested Prescriptions  Pending Prescriptions Disp Refills   buPROPion (WELLBUTRIN XL) 150 MG 24 hr tablet [Pharmacy Med Name: BUPROPION XL 150MG  TABLETS (24 H)] 90 tablet 4    Sig: TAKE 1 TABLET(150 MG) BY MOUTH DAILY     Psychiatry: Antidepressants - bupropion Failed - 10/29/2022  7:06 AM      Failed - Cr in normal range and within 360 days    Creatinine, Ser  Date Value Ref Range Status  10/14/2022 1.04 (H) 0.57 - 1.00 mg/dL Final         Passed - AST in normal range and within 360 days    AST  Date Value Ref Range Status  10/14/2022 19 0 - 40 IU/L Final   AST (SGOT) Piccolo, Waived  Date Value Ref Range Status  12/11/2017 27 11 - 38 U/L Final         Passed - ALT in normal range and within 360 days    ALT  Date Value Ref Range Status  10/14/2022 19 0 - 32 IU/L Final   ALT (SGPT) Piccolo, Waived  Date Value Ref Range Status  12/11/2017 32 10 - 47 U/L Final         Passed - Completed PHQ-2 or PHQ-9 in the last 360 days      Passed - Last BP in normal range    BP Readings from Last 1 Encounters:  10/14/22 123/83         Passed - Valid encounter within last 6 months    Recent Outpatient Visits           2 weeks ago Centrilobular emphysema (HCC)   [place] [facility] [place] T, NP   3 months ago Centrilobular emphysema (HCC)   [place] [facility] [place], Corrie Dandy T, NP   6 months ago COPD exacerbation [facility])   [place] [employer] [place], Megan P, DO   7 months ago COPD exacerbation [facility])   [place] [facility] [place], [place] T, NP   11 months ago Centrilobular emphysema (HCC)   [place] [facility] [place], Dorie Rank, NP       Future Appointments             In 5 months Cannady, Dorie Rank, NP  [facility], [facility]

## 2022-11-03 ENCOUNTER — Ambulatory Visit: Payer: Self-pay

## 2022-11-03 NOTE — Telephone Encounter (Signed)
  Chief Complaint: Shingles since April Symptoms: Pain  rash Frequency: April Pertinent Negatives: Patient denies  Disposition: [] ED /[] Urgent Care (no appt availability in office) / [x] Appointment(In office/virtual)/ []  [facility]/ [] Home Care/ [] Refused Recommended Disposition /[] [facility] [facility]/ []  Follow-up with PCP Additional Notes: Pt called with shingles. Pt has had shingles since April that have not resolved.  Pt states that she still has very painful rash on top of foot, bottom of foot and knee cap.  Per note from encounter 10/14/2022 pt was to have referral to Dr. Welton Flakes for pain management. Pt has not received a call regarding this. Please advise.     Reason for Disposition  [1] Shingles rash AND [2] onset > 72 hours ago (3 days)  Answer Assessment - Initial Assessment Questions 1. APPEARANCE of RASH: "Describe the rash."      On foot, arch of foot and knee cap 2. LOCATION: "Where is the rash located?"      above 3. ONSET: "When did the rash start?"      April 17th 4. ITCHING: "Does the rash itch?" If Yes, ask: "How bad is the itch?"  (Scale 1-10; or mild, moderate, severe)     no 5. PAIN: "Does the rash hurt?" If Yes, ask: "How bad is the pain?"  (Scale 0-10; or none, mild, moderate, severe)    - NONE (0): no pain    - MILD (1-3): doesn't interfere with normal activities     - MODERATE (4-7): interferes with normal activities or awakens from sleep     - SEVERE (8-10): excruciating pain, unable to do any normal activities     Pt states pain is severe  Protocols used: Shingles (Zoster)-A-AH

## 2022-11-03 NOTE — Telephone Encounter (Signed)
Yes please thank you Caitlyn May

## 2022-11-04 ENCOUNTER — Ambulatory Visit: Payer: Medicare HMO | Admitting: Physician Assistant

## 2022-11-15 ENCOUNTER — Other Ambulatory Visit: Payer: Self-pay | Admitting: Nurse Practitioner

## 2022-11-15 NOTE — Telephone Encounter (Signed)
Requested medication (s) are due for refill today: yes  Requested medication (s) are on the active medication list: yes    Last refill: 10/14/22  30g  0 refills  Future visit scheduled yes 04/17/23  Notes to clinic:Not delegated, please review. Thank you.  Requested Prescriptions  Pending Prescriptions Disp Refills   triamcinolone cream (KENALOG) 0.1 % [Pharmacy Med Name: triamcinolone acetonide 0.1 % topical cream] 30 g 0    Sig: APPLY ONE application TOPICALLY TWICE DAILY     Not Delegated - Dermatology:  Corticosteroids Failed - 11/15/2022  2:38 PM      Failed - This refill cannot be delegated      Passed - Valid encounter within last 12 months    Recent Outpatient Visits           1 month ago Centrilobular emphysema (HCC)   [place] [facility] [place], Corrie Dandy T, NP   3 months ago Centrilobular emphysema (HCC)   [place] [facility] [place], Corrie Dandy T, NP   7 months ago COPD exacerbation [facility])   [place] [facility] [place], Megan P, DO   7 months ago COPD exacerbation [facility])   [place] [facility] [place], Corrie Dandy T, NP   11 months ago Centrilobular emphysema [facility])   [place] [facility] [place], Dorie Rank, NP       Future Appointments             In 5 months Cannady, Dorie Rank, NP [facility], [facility]

## 2022-12-20 ENCOUNTER — Other Ambulatory Visit: Payer: Self-pay | Admitting: Nurse Practitioner

## 2022-12-21 NOTE — Telephone Encounter (Signed)
Requested medication (s) are due for refill today:   Provider to review  Requested medication (s) are on the active medication list:   Yes  Future visit scheduled:   Yes   Last ordered: 11/16/2022 30 g, 0 refills  Non delegated refill    Requested Prescriptions  Pending Prescriptions Disp Refills   triamcinolone cream (KENALOG) 0.1 % [Pharmacy Med Name: triamcinolone acetonide 0.1 % topical cream] 30 g 0    Sig: APPLY ONE application TOPICALLY TWICE DAILY     Not Delegated - Dermatology:  Corticosteroids Failed - 12/20/2022 11:22 AM      Failed - This refill cannot be delegated      Passed - Valid encounter within last 12 months    Recent Outpatient Visits           2 months ago Centrilobular emphysema (HCC)   [place] [facility] [place], Corrie Dandy T, NP   4 months ago Centrilobular emphysema (HCC)   [place] [facility] [place], Corrie Dandy T, NP   8 months ago COPD exacerbation [facility]   [place] [facility] [place], Megan P, DO   9 months ago COPD exacerbation [facility])   [place] [facility] [place], Corrie Dandy T, NP   1 year ago Centrilobular emphysema (HCC)   [place] [facility] [place], Dorie Rank, NP       Future Appointments             In 3 months Cannady, Dorie Rank, NP [place] [facility], [facility]

## 2022-12-28 ENCOUNTER — Other Ambulatory Visit: Payer: Self-pay | Admitting: Nurse Practitioner

## 2022-12-28 NOTE — Telephone Encounter (Signed)
Unable to refill per protocol, Rx expired. Discontinued 11/29/21.  Requested Prescriptions  Pending Prescriptions Disp Refills   benazepril (LOTENSIN) 40 MG tablet [Pharmacy Med Name: BENAZEPRIL 40MG  TABLETS] 90 tablet 4    Sig: TAKE 1 TABLET(40 MG) BY MOUTH DAILY     Cardiovascular:  ACE Inhibitors Failed - 12/28/2022  7:07 AM      Failed - Cr in normal range and within 180 days    Creatinine, Ser  Date Value Ref Range Status  10/14/2022 1.04 (H) 0.57 - 1.00 mg/dL Final         Passed - K in normal range and within 180 days    Potassium  Date Value Ref Range Status  10/14/2022 4.1 3.5 - 5.2 mmol/L Final         Passed - Patient is not pregnant      Passed - Last BP in normal range    BP Readings from Last 1 Encounters:  10/14/22 123/83         Passed - Valid encounter within last 6 months    Recent Outpatient Visits           2 months ago Centrilobular emphysema (HCC)   [place] [facility] [place], [place] T, NP   5 months ago Centrilobular emphysema (HCC)   [place] [facility] [place], Corrie Dandy T, NP   8 months ago COPD exacerbation [facility])   [place] [facility] [place], Megan P, DO   9 months ago COPD exacerbation [facility])   [place] [facility] [place], Corrie Dandy T, NP   1 year ago Centrilobular emphysema (HCC)   [place] [facility] [place], Dorie Rank, NP       Future Appointments             In 3 months Cannady, Dorie Rank, NP  [facility], [facility]

## 2023-01-25 ENCOUNTER — Telehealth: Payer: Self-pay | Admitting: Nurse Practitioner

## 2023-01-25 NOTE — Telephone Encounter (Signed)
Pharmacy updated in the patient's chart.

## 2023-01-25 NOTE — Telephone Encounter (Signed)
Please advise patient.  

## 2023-01-25 NOTE — Telephone Encounter (Signed)
Copied from CRM [id number]. Topic: General - Other >> Jan 24, 2023  3:06 PM [place] B wrote: Reason for CRM: pt called in says is now [place] [facility] mail order since [facility] is going out of business

## 2023-01-25 NOTE — Telephone Encounter (Signed)
Patient called and stated [facility] is requesting a call to give all her medications so she can get her medicine as [facility] that she was going to in [place] is going out of business. Patient stated she is will be out of medication on Saturday. Please f/u with patient.

## 2023-01-26 MED ORDER — BUPROPION HCL ER (XL) 150 MG PO TB24: 150.0000 mg | ORAL_TABLET | Freq: Every day | ORAL | 4 refills | Status: DC

## 2023-01-26 MED ORDER — ALBUTEROL SULFATE HFA 108 (90 BASE) MCG/ACT IN AERS: 2.0000 | INHALATION_SPRAY | Freq: Four times a day (QID) | RESPIRATORY_TRACT | 1 refills | Status: DC | PRN

## 2023-01-26 MED ORDER — OLMESARTAN MEDOXOMIL 40 MG PO TABS: 40.0000 mg | ORAL_TABLET | Freq: Every day | ORAL | 4 refills | Status: DC

## 2023-01-26 MED ORDER — CARVEDILOL 12.5 MG PO TABS: 12.5000 mg | ORAL_TABLET | Freq: Two times a day (BID) | ORAL | 4 refills | Status: DC

## 2023-01-26 MED ORDER — ROSUVASTATIN CALCIUM 40 MG PO TABS: 40.0000 mg | ORAL_TABLET | Freq: Every day | ORAL | 4 refills | Status: DC

## 2023-01-26 MED ORDER — AMLODIPINE BESYLATE 5 MG PO TABS: 5.0000 mg | ORAL_TABLET | Freq: Every day | ORAL | 1 refills | Status: DC

## 2023-01-26 MED ORDER — SERTRALINE HCL 100 MG PO TABS: 150.0000 mg | ORAL_TABLET | Freq: Every day | ORAL | 4 refills | Status: DC

## 2023-01-26 MED ORDER — ATORVASTATIN CALCIUM 40 MG PO TABS: 40.0000 mg | ORAL_TABLET | Freq: Every day | ORAL | 1 refills | Status: DC

## 2023-01-26 MED ORDER — HYDROXYZINE PAMOATE 25 MG PO CAPS: 25.0000 mg | ORAL_CAPSULE | Freq: Three times a day (TID) | ORAL | 1 refills | Status: DC | PRN

## 2023-01-26 MED ORDER — PANTOPRAZOLE SODIUM 40 MG PO TBEC: 40.0000 mg | DELAYED_RELEASE_TABLET | Freq: Every day | ORAL | 4 refills | Status: DC

## 2023-01-26 MED ORDER — GABAPENTIN 600 MG PO TABS: 600.0000 mg | ORAL_TABLET | Freq: Three times a day (TID) | ORAL | 4 refills | Status: DC

## 2023-01-26 MED ORDER — TRELEGY ELLIPTA 100-62.5-25 MCG/ACT IN AEPB: 1.0000 | INHALATION_SPRAY | Freq: Every day | RESPIRATORY_TRACT | 11 refills | Status: DC

## 2023-01-26 MED ORDER — TIZANIDINE HCL 4 MG PO TABS: ORAL_TABLET | ORAL | 3 refills | Status: DC

## 2023-01-26 MED ORDER — MEGESTROL ACETATE 40 MG PO TABS: 40.0000 mg | ORAL_TABLET | Freq: Four times a day (QID) | ORAL | 4 refills | Status: DC | PRN

## 2023-01-26 MED ORDER — VITAMIN D (ERGOCALCIFEROL) 1.25 MG (50000 UNIT) PO CAPS: 50000.0000 [IU] | ORAL_CAPSULE | ORAL | 4 refills | Status: AC

## 2023-01-26 MED ORDER — TRIAMCINOLONE ACETONIDE 0.1 % EX CREA: TOPICAL_CREAM | Freq: Two times a day (BID) | CUTANEOUS | 4 refills | Status: DC

## 2023-01-26 NOTE — Telephone Encounter (Signed)
All prescriptions t'd up to be sent to [facility] for the patient.

## 2023-02-03 ENCOUNTER — Other Ambulatory Visit: Payer: Self-pay | Admitting: Nurse Practitioner

## 2023-02-03 MED ORDER — ZOSTER VAC RECOMB ADJUVANTED 50 MCG/0.5ML IM SUSR: 0.5000 mL | Freq: Once | INTRAMUSCULAR | 0 refills | Status: AC

## 2023-02-03 NOTE — Telephone Encounter (Signed)
T'd up prescription.

## 2023-02-03 NOTE — Telephone Encounter (Signed)
Copied from CRM [id number]. Topic: General - Other >> Feb 03, 2023 11:51 AM Macon Large wrote: Reason for CRM: Pt requests that a Rx for shingles vaccine be sent to [facility] - [place], [place] - [address]  Phone: [phone] Fax: [phone]

## 2023-03-03 ENCOUNTER — Encounter: Payer: Self-pay | Admitting: Nurse Practitioner

## 2023-03-03 ENCOUNTER — Ambulatory Visit (INDEPENDENT_AMBULATORY_CARE_PROVIDER_SITE_OTHER): Payer: Medicare HMO | Admitting: Nurse Practitioner

## 2023-03-03 VITALS — BP 99/60 | HR 68 | Temp 98.2°F | Ht 65.98 in | Wt 136.0 lb

## 2023-03-03 DIAGNOSIS — J321 Chronic frontal sinusitis: Secondary | ICD-10-CM | POA: Insufficient documentation

## 2023-03-03 DIAGNOSIS — J0110 Acute frontal sinusitis, unspecified: Secondary | ICD-10-CM | POA: Diagnosis not present

## 2023-03-03 DIAGNOSIS — J432 Centrilobular emphysema: Secondary | ICD-10-CM

## 2023-03-03 DIAGNOSIS — I77810 Thoracic aortic ectasia: Secondary | ICD-10-CM | POA: Insufficient documentation

## 2023-03-03 MED ORDER — PREDNISONE 20 MG PO TABS: 40.0000 mg | ORAL_TABLET | Freq: Every day | ORAL | 0 refills | Status: AC

## 2023-03-03 MED ORDER — ALBUTEROL SULFATE (2.5 MG/3ML) 0.083% IN NEBU: 2.5000 mg | INHALATION_SOLUTION | Freq: Four times a day (QID) | RESPIRATORY_TRACT | 1 refills | Status: DC | PRN

## 2023-03-03 MED ORDER — DOXYCYCLINE HYCLATE 100 MG PO TABS: 100.0000 mg | ORAL_TABLET | Freq: Two times a day (BID) | ORAL | 0 refills | Status: AC

## 2023-03-03 NOTE — Progress Notes (Signed)
BP 99/60 (BP Location: Left Arm, Patient Position: Sitting, Cuff Size: Normal)   Pulse 68   Temp 98.2 F (36.8 C)   Ht 5' 5.98" (1.676 m)   Wt 136 lb (61.7 kg)   SpO2 95%   BMI 21.96 kg/m    Subjective:    Patient ID: Caitlyn May, female    DOB: [birth year], 67 y.o.   MRN: [id number]  HPI: Caitlyn May is a 67 y.o. female  Chief Complaint  Patient presents with   Sinus Problem    Coughing Yellow/green mucus, sinus pressure, weak muscle 2-3 months.    UPPER RESPIRATORY TRACT INFECTION Started with symptoms 6 weeks ago, sinus pressure and blowing out green mucus.  Has underlying emphysema.  Test for Covid negative. Fever: no Cough: yes Shortness of breath: yes Wheezing: yes Chest pain: no Chest tightness: yes Chest congestion: yes Nasal congestion: yes Runny nose: yes Post nasal drip: yes Sneezing: no Sore throat: no Swollen glands: no Sinus pressure: yes Headache: yes Face pain: yes Toothache: yes Ear pain: no Ear pressure: yes bilateral - popping to right Eyes red/itching:no Eye drainage/crusting: no  Vomiting: no Rash: no Fatigue: yes Sick contacts: yes Strep contacts: no  Context: fluctuating Recurrent sinusitis: no Relief with OTC cold/cough medications: yes  Treatments attempted: sinus medications, Flonase    Relevant past medical, surgical, family and social history reviewed and updated as indicated. Interim medical history since our last visit reviewed. Allergies and medications reviewed and updated.  Review of Systems  Constitutional:  Positive for fatigue. Negative for activity change, appetite change, diaphoresis and fever.  HENT:  Positive for congestion, postnasal drip, rhinorrhea, sinus pressure and sinus pain. Negative for ear discharge, ear pain, facial swelling, sneezing, sore throat and voice change.   Respiratory:  Positive for cough, chest tightness, shortness of breath and wheezing.   Cardiovascular:  Negative for chest pain,  palpitations and leg swelling.  Gastrointestinal: Negative.   Neurological:  Positive for headaches. Negative for dizziness and numbness.  Psychiatric/Behavioral: Negative.      Per HPI unless specifically indicated above     Objective:    BP 99/60 (BP Location: Left Arm, Patient Position: Sitting, Cuff Size: Normal)   Pulse 68   Temp 98.2 F (36.8 C)   Ht 5' 5.98" (1.676 m)   Wt 136 lb (61.7 kg)   SpO2 95%   BMI 21.96 kg/m   Wt Readings from Last 3 Encounters:  03/03/23 136 lb (61.7 kg)  10/14/22 136 lb 12.8 oz (62.1 kg)  08/09/22 132 lb (59.9 kg)    Physical Exam Vitals and nursing note reviewed.  Constitutional:      General: She is awake. She is not in acute distress.    Appearance: She is well-developed and well-groomed. She is ill-appearing. She is not toxic-appearing.  HENT:     Head: Normocephalic.     Right Ear: Hearing, ear canal and external ear normal. A middle ear effusion is present. Tympanic membrane is not injected or perforated.     Left Ear: Hearing, ear canal and external ear normal. A middle ear effusion is present. Tympanic membrane is not injected or perforated.     Nose: Rhinorrhea present. Rhinorrhea is clear.     Right Sinus: Frontal sinus tenderness present. No maxillary sinus tenderness.     Left Sinus: Frontal sinus tenderness present. No maxillary sinus tenderness.     Mouth/Throat:     Mouth: Mucous membranes are moist.     Pharynx:  Posterior oropharyngeal erythema (mild) present. No pharyngeal swelling or oropharyngeal exudate.  Eyes:     General: Lids are normal.        Right eye: No discharge.        Left eye: No discharge.     Conjunctiva/sclera: Conjunctivae normal.     Pupils: Pupils are equal, round, and reactive to light.  Neck:     Thyroid: No thyromegaly.     Vascular: No carotid bruit.  Cardiovascular:     Rate and Rhythm: Normal rate and regular rhythm.     Heart sounds: Normal heart sounds. No murmur heard.    No gallop.   Pulmonary:     Effort: Pulmonary effort is normal. No accessory muscle usage or respiratory distress.     Breath sounds: Wheezing present. No decreased breath sounds or rhonchi.     Comments: Expiratory wheezes noted throughout. Abdominal:     General: Bowel sounds are normal.     Palpations: Abdomen is soft. There is no hepatomegaly or splenomegaly.  Musculoskeletal:     Cervical back: Normal range of motion and neck supple.     Right lower leg: No edema.     Left lower leg: No edema.  Lymphadenopathy:     Head:     Right side of head: Submandibular and tonsillar adenopathy present. No submental, preauricular or posterior auricular adenopathy.     Left side of head: Submandibular and tonsillar adenopathy present. No submental, preauricular or posterior auricular adenopathy.     Cervical: No cervical adenopathy.  Skin:    General: Skin is warm and dry.  Neurological:     Mental Status: She is alert and oriented to person, place, and time.  Psychiatric:        Attention and Perception: Attention normal.        Mood and Affect: Mood normal.        Speech: Speech normal.        Behavior: Behavior normal. Behavior is cooperative.        Thought Content: Thought content normal.    Results for orders placed or performed in visit on 10/14/22  CBC with Differential/Platelet  Result Value Ref Range   WBC 7.0 3.4 - 10.8 x10E3/uL   RBC 4.69 3.77 - 5.28 x10E6/uL   Hemoglobin 14.4 11.1 - 15.9 g/dL   Hematocrit 30.8 65.7 - 46.6 %   MCV 91 79 - 97 fL   MCH 30.7 26.6 - 33.0 pg   MCHC 33.8 31.5 - 35.7 g/dL   RDW 84.6 96.2 - 95.2 %   Platelets 274 150 - 450 x10E3/uL   Neutrophils 57 Not Estab. %   Lymphs 30 Not Estab. %   Monocytes 9 Not Estab. %   Eos 3 Not Estab. %   Basos 1 Not Estab. %   Neutrophils Absolute 4.0 1.4 - 7.0 x10E3/uL   Lymphocytes Absolute 2.1 0.7 - 3.1 x10E3/uL   Monocytes Absolute 0.6 0.1 - 0.9 x10E3/uL   EOS (ABSOLUTE) 0.2 0.0 - 0.4 x10E3/uL   Basophils Absolute  0.0 0.0 - 0.2 x10E3/uL   Immature Granulocytes 0 Not Estab. %   Immature Grans (Abs) 0.0 0.0 - 0.1 x10E3/uL  Comprehensive metabolic panel  Result Value Ref Range   Glucose 93 70 - 99 mg/dL   BUN 11 8 - 27 mg/dL   Creatinine, Ser 8.41 (H) 0.57 - 1.00 mg/dL   eGFR 59 (L) >32 GM/WNU/2.72   BUN/Creatinine Ratio 11 (L) 12 - 28  Sodium 143 134 - 144 mmol/L   Potassium 4.1 3.5 - 5.2 mmol/L   Chloride 108 (H) 96 - 106 mmol/L   CO2 20 20 - 29 mmol/L   Calcium 8.9 8.7 - 10.3 mg/dL   Total Protein 6.5 6.0 - 8.5 g/dL   Albumin 4.3 3.9 - 4.9 g/dL   Globulin, Total 2.2 1.5 - 4.5 g/dL   Albumin/Globulin Ratio 2.0 1.2 - 2.2   Bilirubin Total 0.2 0.0 - 1.2 mg/dL   Alkaline Phosphatase 81 44 - 121 IU/L   AST 19 0 - 40 IU/L   ALT 19 0 - 32 IU/L  TSH  Result Value Ref Range   TSH 4.470 0.450 - 4.500 uIU/mL  Lipid Panel w/o Chol/HDL Ratio  Result Value Ref Range   Cholesterol, Total 146 100 - 199 mg/dL   Triglycerides 951 0 - 149 mg/dL   HDL 54 >88 mg/dL   VLDL Cholesterol Cal 20 5 - 40 mg/dL   LDL Chol Calc (NIH) 72 0 - 99 mg/dL  T4, free  Result Value Ref Range   Free T4 1.21 0.82 - 1.77 ng/dL  VITAMIN D 25 Hydroxy (Vit-D Deficiency, Fractures)  Result Value Ref Range   Vit D, 25-Hydroxy 86.8 30.0 - 100.0 ng/mL      Assessment & Plan:   Problem List Items Addressed This Visit       Respiratory   Centrilobular emphysema (HCC) - Primary    Chronic, stable. Continue Trelegy and Albuterol, Trelegy offering benefit.  Pulmonary referral in place, but did not attend. ?need for O2 with activity in future.  Continue annual lung screening, has missed some years due to cost at this time - recommend she schedule.  Recommend complete cessation of smoking.        Relevant Medications   albuterol (PROVENTIL) (2.5 MG/3ML) 0.083% nebulizer solution   predniSONE (DELTASONE) 20 MG tablet   Other Relevant Orders   Ambulatory Referral Lung Cancer Screening [facility]   Frontal sinusitis     Acute for 6 weeks and no improvement with underlying emphysema.  Will start Doxycycline BID for 7 days and Prednisone 40 MG daily for 5 days.  Albuterol nebs sent in, she has a machine at home.  Recommend: - Increased rest - Increasing Fluids - Acetaminophen as needed for fever/pain.  - Salt water gargling, chloraseptic spray and throat lozenges - Mucinex.  - Humidifying the air.       Relevant Medications   doxycycline (VIBRA-TABS) 100 MG tablet   predniSONE (DELTASONE) 20 MG tablet     Follow up plan: Return for November 16th as scheduled.

## 2023-03-03 NOTE — Patient Instructions (Signed)
COPD Exacerbation This video will teach you what types of triggers can make COPD worse, and how to avoid them. To view the content, go to this web address: [email or web address]  This video will expire on: 11/20/2024. If you need access to this video following this date, please reach out to the healthcare provider who assigned it to you. This information is not intended to replace advice given to you by your health care provider. Make sure you discuss any questions you have with your health care provider. Elsevier Patient Education  2024 [employer].

## 2023-03-03 NOTE — Assessment & Plan Note (Signed)
Chronic, stable. Continue Trelegy and Albuterol, Trelegy offering benefit.  Pulmonary referral in place, but did not attend. ?need for O2 with activity in future.  Continue annual lung screening, has missed some years due to cost at this time - recommend she schedule.  Recommend complete cessation of smoking.

## 2023-03-03 NOTE — Assessment & Plan Note (Signed)
Acute for 6 weeks and no improvement with underlying emphysema.  Will start Doxycycline BID for 7 days and Prednisone 40 MG daily for 5 days.  Albuterol nebs sent in, she has a machine at home.  Recommend: - Increased rest - Increasing Fluids - Acetaminophen as needed for fever/pain.  - Salt water gargling, chloraseptic spray and throat lozenges - Mucinex.  - Humidifying the air.

## 2023-04-16 NOTE — Patient Instructions (Incomplete)
Please call to schedule your mammogram and/or bone density: [facility] at [facility]  Address: [address], [place], [place] [address] Phone: [phone]  [facility] [address] [place],  [place]  [address] Phone: [phone]   Heart-Healthy Eating Plan Many factors influence your heart health, including eating and exercise habits. Heart health is also called coronary health. Coronary risk increases with abnormal blood fat (lipid) levels. A heart-healthy eating plan includes limiting unhealthy fats, increasing healthy fats, limiting salt (sodium) intake, and making other diet and lifestyle changes. What is my plan? Your health care provider may recommend that: You limit your fat intake to _________% or less of your total calories each day. You limit your saturated fat intake to _________% or less of your total calories each day. You limit the amount of cholesterol in your diet to less than _________ mg per day. You limit the amount of sodium in your diet to less than _________ mg per day. What are tips for following this plan? Cooking Cook foods using methods other than frying. Baking, boiling, grilling, and broiling are all good options. Other ways to reduce fat include: Removing the skin from poultry. Removing all visible fats from meats. Steaming vegetables in water or broth. Meal planning  At meals, imagine dividing your plate into fourths: Fill one-half of your plate with vegetables and green salads. Fill one-fourth of your plate with whole grains. Fill one-fourth of your plate with lean protein foods. Eat 2-4 cups of vegetables per day. One cup of vegetables equals 1 cup (91 g) broccoli or cauliflower florets, 2 medium carrots, 1 large bell pepper, 1 large sweet potato, 1 large tomato, 1 medium white potato, 2 cups (150 g) raw leafy greens. Eat 1-2 cups of fruit per day. One cup of fruit equals 1 small  apple, 1 large banana, 1 cup (237 g) mixed fruit, 1 large orange,  cup (82 g) dried fruit, 1 cup (240 mL) 100% fruit juice. Eat more foods that contain soluble fiber. Examples include apples, broccoli, carrots, beans, peas, and barley. Aim to get 25-30 g of fiber per day. Increase your consumption of legumes, nuts, and seeds to 4-5 servings per week. One serving of dried beans or legumes equals  cup (90 g) cooked, 1 serving of nuts is  oz (12 almonds, 24 pistachios, or 7 walnut halves), and 1 serving of seeds equals  oz (8 g). Fats Choose healthy fats more often. Choose monounsaturated and polyunsaturated fats, such as olive and canola oils, avocado oil, flaxseeds, walnuts, almonds, and seeds. Eat more omega-3 fats. Choose salmon, mackerel, sardines, tuna, flaxseed oil, and ground flaxseeds. Aim to eat fish at least 2 times each week. Check food labels carefully to identify foods with trans fats or high amounts of saturated fat. Limit saturated fats. These are found in animal products, such as meats, butter, and cream. Plant sources of saturated fats include palm oil, palm kernel oil, and coconut oil. Avoid foods with partially hydrogenated oils in them. These contain trans fats. Examples are stick margarine, some tub margarines, cookies, crackers, and other baked goods. Avoid fried foods. General information Eat more home-cooked food and less restaurant, buffet, and fast food. Limit or avoid alcohol. Limit foods that are high in added sugar and simple starches such as foods made using white refined flour (white breads, pastries, sweets). Lose weight if you are overweight. Losing just 5-10% of your body weight can help your  overall health and prevent diseases such as diabetes and heart disease. Monitor your sodium intake, especially if you have high blood pressure. Talk with your health care provider about your sodium intake. Try to incorporate more vegetarian meals weekly. What foods should I  eat? Fruits All fresh, canned (in natural juice), or frozen fruits. Vegetables Fresh or frozen vegetables (raw, steamed, roasted, or grilled). Green salads. Grains Most grains. Choose whole wheat and whole grains most of the time. Rice and pasta, including brown rice and pastas made with whole wheat. Meats and other proteins Lean, well-trimmed beef, veal, pork, and lamb. Chicken and [place] without skin. All fish and shellfish. Wild duck, rabbit, pheasant, and venison. Egg whites or low-cholesterol egg substitutes. Dried beans, peas, lentils, and tofu. Seeds and most nuts. Dairy Low-fat or nonfat cheeses, including ricotta and mozzarella. Skim or 1% milk (liquid, powdered, or evaporated). Buttermilk made with low-fat milk. Nonfat or low-fat yogurt. Fats and oils Non-hydrogenated (trans-free) margarines. Vegetable oils, including soybean, sesame, sunflower, olive, avocado, peanut, safflower, corn, canola, and cottonseed. Salad dressings or mayonnaise made with a vegetable oil. Beverages Water (mineral or sparkling). Coffee and tea. Unsweetened ice tea. Diet beverages. Sweets and desserts Sherbet, gelatin, and fruit ice. Small amounts of dark chocolate. Limit all sweets and desserts. Seasonings and condiments All seasonings and condiments. The items listed above may not be a complete list of foods and beverages you can eat. Contact a dietitian for more options. What foods should I avoid? Fruits Canned fruit in heavy syrup. Fruit in cream or butter sauce. Fried fruit. Limit coconut. Vegetables Vegetables cooked in cheese, cream, or butter sauce. Fried vegetables. Grains Breads made with saturated or trans fats, oils, or whole milk. Croissants. Sweet rolls. Donuts. High-fat crackers, such as cheese crackers and chips. Meats and other proteins Fatty meats, such as hot dogs, ribs, sausage, bacon, rib-eye roast or steak. High-fat deli meats, such as salami and bologna. Caviar. Domestic duck and  goose. Organ meats, such as liver. Dairy Cream, sour cream, cream cheese, and creamed cottage cheese. Whole-milk cheeses. Whole or 2% milk (liquid, evaporated, or condensed). Whole buttermilk. Cream sauce or high-fat cheese sauce. Whole-milk yogurt. Fats and oils Meat fat, or shortening. Cocoa butter, hydrogenated oils, palm oil, coconut oil, palm kernel oil. Solid fats and shortenings, including bacon fat, salt pork, lard, and butter. Nondairy cream substitutes. Salad dressings with cheese or sour cream. Beverages Regular sodas and any drinks with added sugar. Sweets and desserts Frosting. Pudding. Cookies. Cakes. Pies. Milk chocolate or white chocolate. Buttered syrups. Full-fat ice cream or ice cream drinks. The items listed above may not be a complete list of foods and beverages to avoid. Contact a dietitian for more information. Summary Heart-healthy meal planning includes limiting unhealthy fats, increasing healthy fats, limiting salt (sodium) intake and making other diet and lifestyle changes. Lose weight if you are overweight. Losing just 5-10% of your body weight can help your overall health and prevent diseases such as diabetes and heart disease. Focus on eating a balance of foods, including fruits and vegetables, low-fat or nonfat dairy, lean protein, nuts and legumes, whole grains, and heart-healthy oils and fats. This information is not intended to replace advice given to you by your health care provider. Make sure you discuss any questions you have with your health care provider. Document Revised: 06/21/2021 Document Reviewed: 06/21/2021 Elsevier Patient Education  2024 [employer].

## 2023-04-17 ENCOUNTER — Ambulatory Visit: Payer: Medicare HMO | Admitting: Nurse Practitioner

## 2023-04-17 ENCOUNTER — Encounter: Payer: Self-pay | Admitting: Nurse Practitioner

## 2023-04-17 VITALS — BP 108/74 | HR 93 | Temp 98.0°F | Ht 66.0 in | Wt 139.8 lb

## 2023-04-17 DIAGNOSIS — F419 Anxiety disorder, unspecified: Secondary | ICD-10-CM | POA: Diagnosis not present

## 2023-04-17 DIAGNOSIS — I739 Peripheral vascular disease, unspecified: Secondary | ICD-10-CM

## 2023-04-17 DIAGNOSIS — Z23 Encounter for immunization: Secondary | ICD-10-CM | POA: Diagnosis not present

## 2023-04-17 DIAGNOSIS — Z780 Asymptomatic menopausal state: Secondary | ICD-10-CM

## 2023-04-17 DIAGNOSIS — K219 Gastro-esophageal reflux disease without esophagitis: Secondary | ICD-10-CM | POA: Diagnosis not present

## 2023-04-17 DIAGNOSIS — F339 Major depressive disorder, recurrent, unspecified: Secondary | ICD-10-CM | POA: Diagnosis not present

## 2023-04-17 DIAGNOSIS — I10 Essential (primary) hypertension: Secondary | ICD-10-CM

## 2023-04-17 DIAGNOSIS — J432 Centrilobular emphysema: Secondary | ICD-10-CM | POA: Diagnosis not present

## 2023-04-17 DIAGNOSIS — Z1159 Encounter for screening for other viral diseases: Secondary | ICD-10-CM

## 2023-04-17 DIAGNOSIS — E782 Mixed hyperlipidemia: Secondary | ICD-10-CM | POA: Diagnosis not present

## 2023-04-17 DIAGNOSIS — G894 Chronic pain syndrome: Secondary | ICD-10-CM

## 2023-04-17 DIAGNOSIS — Z1231 Encounter for screening mammogram for malignant neoplasm of breast: Secondary | ICD-10-CM

## 2023-04-17 DIAGNOSIS — I77810 Thoracic aortic ectasia: Secondary | ICD-10-CM | POA: Diagnosis not present

## 2023-04-17 MED ORDER — TRAZODONE HCL 50 MG PO TABS: 25.0000 mg | ORAL_TABLET | Freq: Every evening | ORAL | 3 refills | Status: DC | PRN

## 2023-04-17 MED ORDER — PREDNISONE 20 MG PO TABS: 40.0000 mg | ORAL_TABLET | Freq: Every day | ORAL | 0 refills | Status: AC

## 2023-04-17 MED ORDER — SERTRALINE HCL 100 MG PO TABS: 100.0000 mg | ORAL_TABLET | Freq: Every day | ORAL | Status: AC

## 2023-04-17 MED ORDER — FLUTICASONE PROPIONATE 50 MCG/ACT NA SUSP: 2.0000 | Freq: Every day | NASAL | 6 refills | Status: AC

## 2023-04-17 MED ORDER — DOXYCYCLINE HYCLATE 100 MG PO TABS: 100.0000 mg | ORAL_TABLET | Freq: Two times a day (BID) | ORAL | 0 refills | Status: AC

## 2023-04-17 MED ORDER — FLUCONAZOLE 150 MG PO TABS: 150.0000 mg | ORAL_TABLET | Freq: Once | ORAL | 0 refills | Status: AC

## 2023-04-17 NOTE — Assessment & Plan Note (Signed)
Chronic, stable. Denies SI/HI.  Continue Wellbutrin and Zoloft which are offering her benefit.  Refills as needed.

## 2023-04-17 NOTE — Assessment & Plan Note (Addendum)
Chronic, stable. Continue Trelegy and Albuterol, Trelegy offering benefit.  Pulmonary referral in place, but did not attend. ?need for O2 with activity in future.  Continue annual lung screening, has missed some years due to cost at this time - recommend she schedule -- will check on referral.  Recommend complete cessation of smoking.  Will send in abx and Prednisone x 1 for ongoing sinus issues, if ongoing send to ENT.

## 2023-04-17 NOTE — Assessment & Plan Note (Addendum)
Chronic, ongoing.  Continue Rosuvastatin 40 MG daily, improved leg cramps.  Recommend taking Magnesium at night for leg cramps and sleep.  Labs today.

## 2023-04-17 NOTE — Assessment & Plan Note (Signed)
Chronic, stable.  Continue Protonix daily and attempt reductions in future. Mag level today. Risks of PPI use were discussed with patient including bone loss, C. Diff diarrhea, pneumonia, infections, CKD, electrolyte abnormalities.  Verbalizes understanding and chooses to continue the medication.

## 2023-04-17 NOTE — Assessment & Plan Note (Signed)
Chronic, stable.  BP at goal in office today.  Recommend she monitor BP at least a few mornings a week at home and document.  DASH diet at home.  Due to her COPD continue Olmesartan to allow for ongoing kidney protection, but less affect on COPD.  Labs: CBC, CMP, Lipid.  Continue collaboration with cardiology as needed.  Recommend complete cessation of smoking.

## 2023-04-17 NOTE — Assessment & Plan Note (Signed)
Noted on imaging in 2021.  Will repeat imaging in 2025, does not want to have ordered today.  Discussed with her need to repeat imaging annually and monitor size.  Recommend continue statin and complete cessation of smoking.

## 2023-04-17 NOTE — Assessment & Plan Note (Signed)
Chronic, ongoing.  Continue Gabapentin and renal doses as needed. Refer to pain clinic in future if needed.

## 2023-04-17 NOTE — Progress Notes (Addendum)
BP 108/74   Pulse 93   Temp 98 F (36.7 C) (Oral)   Ht 5\' 6"  (1.676 m)   Wt 139 lb 12.8 oz (63.4 kg)   SpO2 97%   BMI 22.56 kg/m    Subjective:    Patient ID: Caitlyn May, female    DOB: August 29, 1955, 67 y.o.   MRN: [id number]  HPI: Caitlyn May is a 67 y.o. female  Chief Complaint  Patient presents with   Chronic Kidney Disease   COPD   Depression   Hyperlipidemia   Hypertension   HYPERTENSION / HYPERLIPIDEMIA Taking Olmesartan, Carvedilol, Amlodipine, and Rosuvastatin.  Atorvastatin caused her leg cramps in past.    Reports concerns about occasional feet swelling and discoloration of toes. Satisfied with current treatment? yes Duration of hypertension: chronic BP monitoring frequency: daily BP range: 110-120/70-80 BP medication side effects: no Duration of hyperlipidemia: chronic Cholesterol medication side effects: no Cholesterol supplements: none Medication compliance: good compliance Aspirin: no Recent stressors: no Recurrent headaches: no Visual changes: no Palpitations: no Dyspnea: yes at baseline Chest pain: no Lower extremity edema: no Dizzy/lightheaded: no  The 10-year ASCVD risk score (Arnett DK, et al., 2019) is: 11.1%   Values used to calculate the score:     Age: 73 years     Sex: Female     Is Non-Hispanic African American: No     Diabetic: No     Tobacco smoker: Yes     Systolic Blood Pressure: 108 mmHg     Is BP treated: Yes     HDL Cholesterol: 54 mg/dL     Total Cholesterol: 146 mg/dL  COPD Using Trelegy and Albuterol daily.  Saw pulmonary last 03/01/22, they ordered pulmonary rehab.  Treated for exacerbation on 03/03/23 last, reports some lingering sinus issues.  Lung CT last on 08/15/2019 that noted aortic atherosclerosis and emphysema, is to have annually, has order to return.  Started smoking at age 86-14 years.  Currently is cutting back, was a 2 PPD smoker and now is at 1 PPD.   COPD status: stable Satisfied with current  treatment?: yes Oxygen use: no Dyspnea frequency: at baseline Cough frequency: at baseline Rescue inhaler frequency: 1-2 times a week Limitation of activity: no Productive cough: none Last Spirometry: unknown Pneumovax: Up to Date Influenza: Up to Date   CHRONIC KIDNEY DISEASE (Stage 3a) Stable. Continues on Protonix daily for GERD. CKD status: stable Medications renally dose: yes Previous renal evaluation: no Pneumovax:  Up to Date Influenza Vaccine:  Up to Date  DEPRESSION Taking Zoloft and Wellbutrin XL.  Continues on Gabapentin 300 MG TID for chronic pain due to failed back surgery syndrome.  Does have difficulty sleeping.  Trazodone used years ago, which did not work well at time. Worked 3rd shift in past. Mood status: stable Satisfied with current treatment?: yes Symptom severity: moderate  Duration of current treatment : chronic Side effects: no Medication compliance: good compliance Psychotherapy/counseling: yes in the past Previous psychiatric medications: Xanax Depressed mood: yes Anxious mood: yes Anhedonia: no Significant weight loss or gain: no Insomnia: yes hard to stay asleep Fatigue: yes Feelings of worthlessness or guilt: no Impaired concentration/indecisiveness: no Suicidal ideations: no Hopelessness: no Crying spells: no    04/17/2023    2:48 PM 10/14/2022    1:30 PM 08/09/2022    1:35 PM 04/15/2022    9:53 AM 03/21/2022    3:57 PM  Depression screen PHQ 2/9  Decreased Interest 1 2 1 2 1   Down,  Depressed, Hopeless 1 1 1 1 1   PHQ - 2 Score 2 3 2 3 2   Altered sleeping 1 2 1 3  0  Tired, decreased energy 1 1 1 3  0  Change in appetite 0 0  2 1  Feeling bad or failure about yourself  0 1  2 0  Trouble concentrating 0 0  1 0  Moving slowly or fidgety/restless 0 0  1 0  Suicidal thoughts 0 0  0 0  PHQ-9 Score 4 7 4 15 3   Difficult doing work/chores Not difficult at all Somewhat difficult Not difficult at all Somewhat difficult Not difficult at all        04/17/2023    2:48 PM 10/14/2022    1:30 PM 04/15/2022    9:54 AM 03/21/2022    3:58 PM  GAD 7 : Generalized Anxiety Score  Nervous, Anxious, on Edge 1 1 2 1   Control/stop worrying 1 1 2 1   Worry too much - different things 1 1 2 1   Trouble relaxing 0 0 2 0  Restless 0 0 1 0  Easily annoyed or irritable 1 1 1 1   Afraid - awful might happen 0 0 0 1  Total GAD 7 Score 4 4 10 5   Anxiety Difficulty Not difficult at all Not difficult at all Somewhat difficult Somewhat difficult     Relevant past medical, surgical, family and social history reviewed and updated as indicated. Interim medical history since our last visit reviewed. Allergies and medications reviewed and updated.  Review of Systems  Constitutional:  Negative for activity change, appetite change, diaphoresis, fatigue and fever.  HENT:  Positive for congestion, postnasal drip, rhinorrhea, sinus pressure and sinus pain. Negative for ear discharge, ear pain, sore throat and voice change.   Respiratory:  Negative for cough, chest tightness and shortness of breath.   Cardiovascular:  Negative for chest pain, palpitations and leg swelling.  Gastrointestinal: Negative.   Endocrine: Negative.   Musculoskeletal:  Positive for arthralgias.  Neurological: Negative.   Psychiatric/Behavioral:  Positive for sleep disturbance. Negative for decreased concentration, self-injury and suicidal ideas. The patient is nervous/anxious.    Per HPI unless specifically indicated above     Objective:    BP 108/74   Pulse 93   Temp 98 F (36.7 C) (Oral)   Ht 5\' 6"  (1.676 m)   Wt 139 lb 12.8 oz (63.4 kg)   SpO2 97%   BMI 22.56 kg/m   Wt Readings from Last 3 Encounters:  04/17/23 139 lb 12.8 oz (63.4 kg)  03/03/23 136 lb (61.7 kg)  10/14/22 136 lb 12.8 oz (62.1 kg)    Physical Exam Vitals and nursing note reviewed.  Constitutional:      General: She is awake. She is not in acute distress.    Appearance: Normal appearance. She is  well-developed and well-groomed. She is not ill-appearing or toxic-appearing.  HENT:     Head: Normocephalic.     Right Ear: Hearing, ear canal and external ear normal. A middle ear effusion is present. There is no impacted cerumen. Tympanic membrane is not injected.     Left Ear: Hearing, ear canal and external ear normal. A middle ear effusion is present. There is no impacted cerumen. Tympanic membrane is not injected.     Nose: Rhinorrhea present. Rhinorrhea is clear.     Right Turbinates: Not swollen.     Left Turbinates: Not swollen.     Right Sinus: Maxillary sinus tenderness present. No  frontal sinus tenderness.     Left Sinus: Maxillary sinus tenderness present. No frontal sinus tenderness.     Mouth/Throat:     Mouth: Mucous membranes are moist.     Pharynx: Posterior oropharyngeal erythema (mild) present. No pharyngeal swelling or oropharyngeal exudate.  Eyes:     General: Lids are normal.        Right eye: No discharge.        Left eye: No discharge.     Conjunctiva/sclera: Conjunctivae normal.     Pupils: Pupils are equal, round, and reactive to light.  Neck:     Thyroid: No thyromegaly.     Vascular: No carotid bruit.  Cardiovascular:     Rate and Rhythm: Normal rate and regular rhythm.     Pulses:          Dorsalis pedis pulses are 1+ on the right side and 1+ on the left side.       Posterior tibial pulses are 1+ on the right side and 1+ on the left side.     Heart sounds: Normal heart sounds. No murmur heard.    No gallop.  Pulmonary:     Effort: Pulmonary effort is normal. No accessory muscle usage or respiratory distress.     Breath sounds: Normal breath sounds. No decreased breath sounds, wheezing or rhonchi.  Abdominal:     General: Bowel sounds are normal. There is no distension.     Palpations: Abdomen is soft.     Tenderness: There is no abdominal tenderness.  Musculoskeletal:     Cervical back: Normal range of motion and neck supple.     Right lower leg:  No edema.     Left lower leg: No edema.     Right foot: Normal range of motion.     Left foot: Normal range of motion.  Feet:     Right foot:     Protective Sensation: 10 sites tested.  10 sites sensed.     Skin integrity: Skin integrity normal.     Toenail Condition: Right toenails are abnormally thick.     Left foot:     Protective Sensation: 10 sites tested.  10 sites sensed.     Skin integrity: Skin integrity normal.     Toenail Condition: Left toenails are abnormally thick.     Comments: Cold feet bilaterally.  Slight bluish color to tips of toes. Lymphadenopathy:     Cervical: No cervical adenopathy.  Skin:    General: Skin is warm and dry.  Neurological:     Mental Status: She is alert and oriented to person, place, and time.     Deep Tendon Reflexes: Reflexes are normal and symmetric.     Reflex Scores:      Brachioradialis reflexes are 2+ on the right side and 2+ on the left side.      Patellar reflexes are 2+ on the right side and 2+ on the left side. Psychiatric:        Attention and Perception: Attention normal.        Mood and Affect: Mood normal.        Speech: Speech normal.        Behavior: Behavior normal. Behavior is cooperative.        Thought Content: Thought content normal.    Results for orders placed or performed in visit on 10/14/22  CBC with Differential/Platelet  Result Value Ref Range   WBC 7.0 3.4 - 10.8 x10E3/uL  RBC 4.69 3.77 - 5.28 x10E6/uL   Hemoglobin 14.4 11.1 - 15.9 g/dL   Hematocrit 16.1 09.6 - 46.6 %   MCV 91 79 - 97 fL   MCH 30.7 26.6 - 33.0 pg   MCHC 33.8 31.5 - 35.7 g/dL   RDW 04.5 40.9 - 81.1 %   Platelets 274 150 - 450 x10E3/uL   Neutrophils 57 Not Estab. %   Lymphs 30 Not Estab. %   Monocytes 9 Not Estab. %   Eos 3 Not Estab. %   Basos 1 Not Estab. %   Neutrophils Absolute 4.0 1.4 - 7.0 x10E3/uL   Lymphocytes Absolute 2.1 0.7 - 3.1 x10E3/uL   Monocytes Absolute 0.6 0.1 - 0.9 x10E3/uL   EOS (ABSOLUTE) 0.2 0.0 - 0.4  x10E3/uL   Basophils Absolute 0.0 0.0 - 0.2 x10E3/uL   Immature Granulocytes 0 Not Estab. %   Immature Grans (Abs) 0.0 0.0 - 0.1 x10E3/uL  Comprehensive metabolic panel  Result Value Ref Range   Glucose 93 70 - 99 mg/dL   BUN 11 8 - 27 mg/dL   Creatinine, Ser 9.14 (H) 0.57 - 1.00 mg/dL   eGFR 59 (L) >78 GN/FAO/1.30   BUN/Creatinine Ratio 11 (L) 12 - 28   Sodium 143 134 - 144 mmol/L   Potassium 4.1 3.5 - 5.2 mmol/L   Chloride 108 (H) 96 - 106 mmol/L   CO2 20 20 - 29 mmol/L   Calcium 8.9 8.7 - 10.3 mg/dL   Total Protein 6.5 6.0 - 8.5 g/dL   Albumin 4.3 3.9 - 4.9 g/dL   Globulin, Total 2.2 1.5 - 4.5 g/dL   Albumin/Globulin Ratio 2.0 1.2 - 2.2   Bilirubin Total 0.2 0.0 - 1.2 mg/dL   Alkaline Phosphatase 81 44 - 121 IU/L   AST 19 0 - 40 IU/L   ALT 19 0 - 32 IU/L  TSH  Result Value Ref Range   TSH 4.470 0.450 - 4.500 uIU/mL  Lipid Panel w/o Chol/HDL Ratio  Result Value Ref Range   Cholesterol, Total 146 100 - 199 mg/dL   Triglycerides 865 0 - 149 mg/dL   HDL 54 >78 mg/dL   VLDL Cholesterol Cal 20 5 - 40 mg/dL   LDL Chol Calc (NIH) 72 0 - 99 mg/dL  T4, free  Result Value Ref Range   Free T4 1.21 0.82 - 1.77 ng/dL  VITAMIN D 25 Hydroxy (Vit-D Deficiency, Fractures)  Result Value Ref Range   Vit D, 25-Hydroxy 86.8 30.0 - 100.0 ng/mL      Assessment & Plan:   Problem List Items Addressed This Visit       Cardiovascular and Mediastinum   Hypertension    Chronic, stable.  BP at goal in office today.  Recommend she monitor BP at least a few mornings a week at home and document.  DASH diet at home.  Due to her COPD continue Olmesartan to allow for ongoing kidney protection, but less affect on COPD.  Labs: CBC, CMP, Lipid.  Continue collaboration with cardiology as needed.  Recommend complete cessation of smoking.       PVD (peripheral vascular disease) (HCC)    Suspect some PVD present due to exam and long time smoking.  She does not want vascular referral at this time, but  may consider next visit.  Discussed at length with her.      Thoracic aortic ectasia (HCC)    Noted on imaging in 2021.  Will repeat imaging in 2025, does not  want to have ordered today.  Discussed with her need to repeat imaging annually and monitor size.  Recommend continue statin and complete cessation of smoking.        Respiratory   Centrilobular emphysema (HCC) - Primary    Chronic, stable. Continue Trelegy and Albuterol, Trelegy offering benefit.  Pulmonary referral in place, but did not attend. ?need for O2 with activity in future.  Continue annual lung screening, has missed some years due to cost at this time - recommend she schedule -- will check on referral.  Recommend complete cessation of smoking.  Will send in abx and Prednisone x 1 for ongoing sinus issues, if ongoing send to ENT.      Relevant Medications   predniSONE (DELTASONE) 20 MG tablet   fluticasone (FLONASE) 50 MCG/ACT nasal spray   Other Relevant Orders   CBC with Differential/Platelet     Digestive   Acid reflux    Chronic, stable.  Continue Protonix daily and attempt reductions in future. Mag level today. Risks of PPI use were discussed with patient including bone loss, C. Diff diarrhea, pneumonia, infections, CKD, electrolyte abnormalities.  Verbalizes understanding and chooses to continue the medication.       Relevant Orders   Magnesium     Other   Chronic pain syndrome (Chronic)    Chronic, ongoing.  Continue Gabapentin and renal doses as needed. Refer to pain clinic in future if needed.      Relevant Medications   sertraline (ZOLOFT) 100 MG tablet   traZODone (DESYREL) 50 MG tablet   predniSONE (DELTASONE) 20 MG tablet   Anxiety   Relevant Medications   sertraline (ZOLOFT) 100 MG tablet   traZODone (DESYREL) 50 MG tablet   Depression, recurrent (HCC)    Chronic, stable. Denies SI/HI.  Continue Wellbutrin and Zoloft which are offering her benefit.  Refills as needed.      Relevant  Medications   sertraline (ZOLOFT) 100 MG tablet   traZODone (DESYREL) 50 MG tablet   Hyperlipidemia    Chronic, ongoing.  Continue Rosuvastatin 40 MG daily, improved leg cramps.  Recommend taking Magnesium at night for leg cramps and sleep.  Labs today.      Relevant Orders   Comprehensive metabolic panel   Lipid Panel w/o Chol/HDL Ratio   Other Visit Diagnoses     Encounter for screening mammogram for malignant neoplasm of breast       Mammogram ordered and instructed how to schedule.   Relevant Orders   MM 3D SCREENING MAMMOGRAM BILATERAL BREAST   Postmenopausal estrogen deficiency       DEXA scan ordered and instructed how to schedule.   Relevant Orders   DG Bone Density   Need for hepatitis C screening test       Hep C screening today, discussed with patient.   Relevant Orders   Hepatitis C antibody   Flu vaccine need       Flu vaccine in office today, educated patient.   Relevant Orders   Flu Vaccine Trivalent High Dose (Fluad) (Completed)        Follow up plan: Return in about 8 weeks (around 06/12/2023) for INSOMNIA AND SINUS.

## 2023-04-17 NOTE — Assessment & Plan Note (Signed)
Suspect some PVD present due to exam and long time smoking.  She does not want vascular referral at this time, but may consider next visit.  Discussed at length with her.

## 2023-04-18 LAB — CBC WITH DIFFERENTIAL/PLATELET
Basophils Absolute: 0.1 10*3/uL (ref 0.0–0.2)
Basos: 1 %
EOS (ABSOLUTE): 0.1 10*3/uL (ref 0.0–0.4)
Eos: 2 %
Hematocrit: 44.7 % (ref 34.0–46.6)
Hemoglobin: 15 g/dL (ref 11.1–15.9)
Immature Grans (Abs): 0 10*3/uL (ref 0.0–0.1)
Immature Granulocytes: 0 %
Lymphocytes Absolute: 2.2 10*3/uL (ref 0.7–3.1)
Lymphs: 25 %
MCH: 30.7 pg (ref 26.6–33.0)
MCHC: 33.6 g/dL (ref 31.5–35.7)
MCV: 92 fL (ref 79–97)
Monocytes Absolute: 0.8 10*3/uL (ref 0.1–0.9)
Monocytes: 9 %
Neutrophils Absolute: 5.7 10*3/uL (ref 1.4–7.0)
Neutrophils: 63 %
Platelets: 276 10*3/uL (ref 150–450)
RBC: 4.88 x10E6/uL (ref 3.77–5.28)
RDW: 12 % (ref 11.7–15.4)
WBC: 8.9 10*3/uL (ref 3.4–10.8)

## 2023-04-18 LAB — COMPREHENSIVE METABOLIC PANEL
ALT: 15 [IU]/L (ref 0–32)
AST: 17 [IU]/L (ref 0–40)
Albumin: 4.4 g/dL (ref 3.9–4.9)
Alkaline Phosphatase: 97 [IU]/L (ref 44–121)
BUN/Creatinine Ratio: 8 — ABNORMAL LOW (ref 12–28)
BUN: 11 mg/dL (ref 8–27)
Bilirubin Total: 0.5 mg/dL (ref 0.0–1.2)
CO2: 20 mmol/L (ref 20–29)
Calcium: 9.3 mg/dL (ref 8.7–10.3)
Chloride: 106 mmol/L (ref 96–106)
Creatinine, Ser: 1.3 mg/dL — ABNORMAL HIGH (ref 0.57–1.00)
Globulin, Total: 2.4 g/dL (ref 1.5–4.5)
Glucose: 101 mg/dL — ABNORMAL HIGH (ref 70–99)
Potassium: 4.3 mmol/L (ref 3.5–5.2)
Sodium: 143 mmol/L (ref 134–144)
Total Protein: 6.8 g/dL (ref 6.0–8.5)
eGFR: 45 mL/min/{1.73_m2} — ABNORMAL LOW (ref >59)

## 2023-04-18 LAB — LIPID PANEL W/O CHOL/HDL RATIO
Cholesterol, Total: 143 mg/dL (ref 100–199)
HDL: 52 mg/dL (ref >39)
LDL Chol Calc (NIH): 69 mg/dL (ref 0–99)
Triglycerides: 121 mg/dL (ref 0–149)
VLDL Cholesterol Cal: 22 mg/dL (ref 5–40)

## 2023-04-18 LAB — MAGNESIUM: Magnesium: 1.9 mg/dL (ref 1.6–2.3)

## 2023-04-18 LAB — HEPATITIS C ANTIBODY: Hep C Virus Ab: NONREACTIVE

## 2023-04-18 NOTE — Progress Notes (Signed)
Good day, please let Cythia know labs have returned: Overall these are stable and no medication changes needed at this time.  However, kidney function continues to show stage 3a kidney disease.  Please ensure good fluid intake daily and we will monitor this closely.  Continue all current medications.  Have a great day!! Keep being stellar!!  Thank you for allowing me to participate in your care.  I appreciate you. Kindest regards, Johan Antonacci

## 2023-04-19 ENCOUNTER — Telehealth: Payer: Self-pay

## 2023-04-19 NOTE — Telephone Encounter (Signed)
-----   Message from Dorie Rank [facility] sent at 04/18/2023  6:44 PM EST ----- Good day, please let Rethia know labs have returned: Overall these are stable and no medication changes needed at this time.  However, kidney function continues to show stage 3a kidney disease.  Please ensure good fluid intake daily and we will monitor this closely.  Continue all current medications.  Have a great day!! Keep being stellar!!  Thank you for allowing me to participate in your care.  I appreciate you. Kindest regards, Jolene

## 2023-05-10 ENCOUNTER — Ambulatory Visit: Payer: Self-pay

## 2023-05-10 NOTE — Telephone Encounter (Signed)
  Chief Complaint: cough Symptoms: cough productive and hard at times causing vomiting, sneezing, chills, SOB but not worse than normal  Frequency: 3 days  Pertinent Negatives: NA Disposition: [] ED /[] Urgent Care (no appt availability in office) / [x] Appointment(In office/virtual)/ []  [facility]/ [] Home Care/ [] Refused Recommended Disposition /[] [facility]/ []  Follow-up with PCP Additional Notes: pt states she has had cough and so bad she had vomiting x 3 the other night. Is coughing up a lot of mucus as well. Pt has been doing neb txs also. Scheduled OV for tomorrow at 1000 with Dr. Laural Benes. Care advice given and pt verbalized understanding.    Reason for Disposition  [1] Continuous (nonstop) coughing interferes with work or school AND [2] no improvement using cough treatment per Care Advice  Answer Assessment - Initial Assessment Questions 1. ONSET: "When did the cough begin?"      3 days  2. SEVERITY: "How bad is the cough today?"      Pretty rough  3. SPUTUM: "Describe the color of your sputum" (none, dry cough; clear, white, yellow, green)     Clear and yellow  5. DIFFICULTY BREATHING: "Are you having difficulty breathing?" If Yes, ask: "How bad is it?" (e.g., mild, moderate, severe)    - MILD: No SOB at rest, mild SOB with walking, speaks normally in sentences, can lie down, no retractions, pulse < 100.    - MODERATE: SOB at rest, SOB with minimal exertion and prefers to sit, cannot lie down flat, speaks in phrases, mild retractions, audible wheezing, pulse 100-120.    - SEVERE: Very SOB at rest, speaks in single words, struggling to breathe, sitting hunched forward, retractions, pulse > 120      Mild at times  8. LUNG HISTORY: "Do you have any history of lung disease?"  (e.g., pulmonary embolus, asthma, emphysema)     COPD  10. OTHER SYMPTOMS: "Do you have any other symptoms?" (e.g., runny nose, wheezing, chest pain)       Had vomiting x 3 previous night d/t  cough, sneezing, chills  Protocols used: Cough - Acute Productive-A-AH

## 2023-05-10 NOTE — Telephone Encounter (Signed)
Summary: Coughing so much sometimes I vomit   Coughing so much sometimes I vomit. Cold but no fever. 3rd day ,,, not getting better     Left message to call back about symptoms.

## 2023-05-11 ENCOUNTER — Ambulatory Visit: Payer: Medicare HMO | Admitting: Family Medicine

## 2023-05-12 ENCOUNTER — Ambulatory Visit: Payer: Medicare HMO | Admitting: Pediatrics

## 2023-05-15 ENCOUNTER — Ambulatory Visit: Payer: Medicare HMO | Admitting: Pediatrics

## 2023-05-16 ENCOUNTER — Ambulatory Visit (INDEPENDENT_AMBULATORY_CARE_PROVIDER_SITE_OTHER): Payer: Medicare HMO | Admitting: Pediatrics

## 2023-05-16 ENCOUNTER — Encounter: Payer: Self-pay | Admitting: Pediatrics

## 2023-05-16 VITALS — BP 115/78 | HR 99 | Temp 98.3°F | Resp 16 | Wt 137.8 lb

## 2023-05-16 DIAGNOSIS — J029 Acute pharyngitis, unspecified: Secondary | ICD-10-CM | POA: Diagnosis not present

## 2023-05-16 DIAGNOSIS — J329 Chronic sinusitis, unspecified: Secondary | ICD-10-CM | POA: Diagnosis not present

## 2023-05-16 DIAGNOSIS — Z1330 Encounter for screening examination for mental health and behavioral disorders, unspecified: Secondary | ICD-10-CM

## 2023-05-16 DIAGNOSIS — J432 Centrilobular emphysema: Secondary | ICD-10-CM | POA: Diagnosis not present

## 2023-05-16 NOTE — Progress Notes (Signed)
Office Visit  BP 115/78 (BP Location: Left Arm, Patient Position: Sitting, Cuff Size: Normal)   Pulse 99   Temp 98.3 F (36.8 C) (Oral)   Resp 16   Wt 137 lb 12.8 oz (62.5 kg)   SpO2 95%   BMI 22.24 kg/m    Subjective:    Patient ID: Caitlyn May, female    DOB: Feb 05, 1956, 67 y.o.   MRN: [id number]  HPI: Caitlyn May is a 67 y.o. female  Chief Complaint  Patient presents with   Sinus Problem    Bad case. Still blowing out green and yellow.     Discussed the use of AI scribe software for clinical note transcription with the patient, who gave verbal consent to proceed.  History of Present Illness   The patient, with a history of emphysema, presents with a persistent cough and sinusitis that has been ongoing for the past two months. She reports a recent exacerbation of symptoms, with the cough becoming so severe that it induced vomiting on three separate occasions. The patient describes the cough as deep and hard, and the vomiting episodes as extremely distressing. She also reports a significant amount of yellow-green nasal discharge from one nostril and yellow discharge from the other.  The patient has been on two courses of antibiotics, the last one being in October, with no resolution of symptoms. She expresses concern that the antibiotics may not be effective for her. She also reports facial tenderness and inflammation, particularly on one side of the face, which she describes as feeling swollen.  In addition to the respiratory symptoms, the patient also reports a significant amount of post-nasal drainage, which she believes may be contributing to her throat discomfort. She has a history of strep throat as a child and expresses concern about a possible recurrence.  Despite the severity of her symptoms, the patient reports that her emphysema appears to be well-controlled with her current inhaler regimen. She denies any recent changes in her breathing or lung function.       Relevant past medical, surgical, family and social history reviewed and updated as indicated. Interim medical history since our last visit reviewed. Allergies and medications reviewed and updated.  ROS per HPI unless specifically indicated above     Objective:    BP 115/78 (BP Location: Left Arm, Patient Position: Sitting, Cuff Size: Normal)   Pulse 99   Temp 98.3 F (36.8 C) (Oral)   Resp 16   Wt 137 lb 12.8 oz (62.5 kg)   SpO2 95%   BMI 22.24 kg/m   Wt Readings from Last 3 Encounters:  05/16/23 137 lb 12.8 oz (62.5 kg)  04/17/23 139 lb 12.8 oz (63.4 kg)  03/03/23 136 lb (61.7 kg)     Physical Exam Constitutional:      Appearance: Normal appearance.  HENT:     Head: Normocephalic and atraumatic.     Nose: Congestion present.     Right Turbinates: Enlarged.     Left Turbinates: Enlarged.     Right Sinus: Maxillary sinus tenderness and frontal sinus tenderness present.     Left Sinus: Maxillary sinus tenderness and frontal sinus tenderness present.     Comments: Green and yellow mucous seen in sinuses Eyes:     Pupils: Pupils are equal, round, and reactive to light.  Cardiovascular:     Rate and Rhythm: Normal rate and regular rhythm.     Pulses: Normal pulses.     Heart sounds: Normal heart sounds.  Pulmonary:     Effort: Pulmonary effort is normal.     Breath sounds: Normal breath sounds.  Musculoskeletal:        General: Normal range of motion.     Cervical back: Normal range of motion.  Skin:    General: Skin is warm and dry.     Capillary Refill: Capillary refill takes less than 2 seconds.  Neurological:     General: No focal deficit present.     Mental Status: She is alert. Mental status is at baseline.  Psychiatric:        Mood and Affect: Mood normal.        Behavior: Behavior normal.         04/17/2023    2:48 PM 10/14/2022    1:30 PM 08/09/2022    1:35 PM 04/15/2022    9:53 AM 03/21/2022    3:57 PM  Depression screen PHQ 2/9  Decreased  Interest 1 2 1 2 1   Down, Depressed, Hopeless 1 1 1 1 1   PHQ - 2 Score 2 3 2 3 2   Altered sleeping 1 2 1 3  0  Tired, decreased energy 1 1 1 3  0  Change in appetite 0 0  2 1  Feeling bad or failure about yourself  0 1  2 0  Trouble concentrating 0 0  1 0  Moving slowly or fidgety/restless 0 0  1 0  Suicidal thoughts 0 0  0 0  PHQ-9 Score 4 7 4 15 3   Difficult doing work/chores Not difficult at all Somewhat difficult Not difficult at all Somewhat difficult Not difficult at all       04/17/2023    2:48 PM 10/14/2022    1:30 PM 04/15/2022    9:54 AM 03/21/2022    3:58 PM  GAD 7 : Generalized Anxiety Score  Nervous, Anxious, on Edge 1 1 2 1   Control/stop worrying 1 1 2 1   Worry too much - different things 1 1 2 1   Trouble relaxing 0 0 2 0  Restless 0 0 1 0  Easily annoyed or irritable 1 1 1 1   Afraid - awful might happen 0 0 0 1  Total GAD 7 Score 4 4 10 5   Anxiety Difficulty Not difficult at all Not difficult at all Somewhat difficult Somewhat difficult       Assessment & Plan:  Assessment & Plan   Recurrent sinusitis Sore throat Assessment & Plan: Per patient, current symptoms present since October/November though may have had recent viral/URI. Possible sore throat, though she's not sure if this is chronic. Overall I do think these are more chronic/subacut symptoms with yellow-green nasal discharge and cough. Multiple failed rounds of abxs this year.  Facial tenderness and inflammation noted on examination. I recommend holding off on further abx and seeing ENT specialist for culture/further guidance given persistence of sx. -Refer to ENT specialist for further evaluation and management. -Collect swabs for COVID, flu, and strep to rule out concurrent infections.  Orders: -     Ambulatory referral to ENT -     Rapid Strep Screen ([facility] ONLY) -     Novel Coronavirus, NAA ([facility]) -     Veritor Flu A/B Waived  Centrilobular emphysema (HCC) Assessment &  Plan: Chronic condition, currently on inhalers. No acute exacerbation noted during this visit. -Continue current inhaler regimen.   Encounter for behavioral health screening As part of their intake evaluation, the patient was screened for depression, anxiety.  PHQ9 SCORE 4, GAD7 SCORE 4. Screening results negative for tested conditions. See plan under problem/diagnosis above.   Follow up plan: Return if symptoms worsen or fail to improve.  Jerick Khachatryan Howell Pringle, MD   Approximately 30 minutes spent on patient encounter today including assessment, counseling, diagnosing, treatment plan development, and charting.

## 2023-05-16 NOTE — Assessment & Plan Note (Addendum)
Per patient, current symptoms present since October/November though may have had recent viral/URI. Possible sore throat, though she's not sure if this is chronic. Overall I do think these are more chronic/subacut symptoms with yellow-green nasal discharge and cough. Multiple failed rounds of abxs this year.  Facial tenderness and inflammation noted on examination. I recommend holding off on further abx and seeing ENT specialist for culture/further guidance given persistence of sx. -Refer to ENT specialist for further evaluation and management. -Collect swabs for COVID, flu, and strep to rule out concurrent infections.

## 2023-05-16 NOTE — Patient Instructions (Signed)
They will call you for ENT referral

## 2023-05-16 NOTE — Assessment & Plan Note (Signed)
Chronic condition, currently on inhalers. No acute exacerbation noted during this visit. -Continue current inhaler regimen.

## 2023-05-18 ENCOUNTER — Telehealth: Payer: Self-pay | Admitting: *Deleted

## 2023-05-18 LAB — NOVEL CORONAVIRUS, NAA: SARS-CoV-2, NAA: NOT DETECTED

## 2023-05-18 NOTE — Telephone Encounter (Signed)
  Chief Complaint: Results Symptoms: NA Frequency: NA Pertinent Negatives: Patient denies NA Disposition: [] ED /[] Urgent Care (no appt availability in office) / [] Appointment(In office/virtual)/ []  [place] [facility]/ [] Home Care/ [] Refused Recommended Disposition /[]  Mobile Bus/ []  Follow-up with PCP  Additional Notes:  Pt called for lab results, reviewed, verbalizes understanding.     Please call patient and let them know she tested negative for flu, strep and covid. Let me know if any questions/concerns. Thanks!

## 2023-05-19 LAB — CULTURE, GROUP A STREP: Strep A Culture: NEGATIVE

## 2023-05-19 LAB — RAPID STREP SCREEN (MED CTR MEBANE ONLY): Strep Gp A Ag, IA W/Reflex: NEGATIVE

## 2023-05-19 LAB — VERITOR FLU A/B WAIVED
Influenza A: NEGATIVE
Influenza B: NEGATIVE

## 2023-06-12 ENCOUNTER — Ambulatory Visit: Payer: Medicare HMO | Admitting: Nurse Practitioner

## 2023-07-27 ENCOUNTER — Other Ambulatory Visit: Payer: Self-pay | Admitting: Nurse Practitioner

## 2023-07-28 NOTE — Telephone Encounter (Signed)
 Requested medication (s) are due for refill today: expired  Requested medication (s) are on the active medication list: no  Last refill:  02/03/23  Future visit scheduled: no  Notes to clinic:  expired medication . Medication not assigned to a protocol. Do you want to order Rx?     Requested Prescriptions  Pending Prescriptions Disp Refills   SHINGRIX injection [Pharmacy Med Name: SHINGRIX 50 MCG/0.5ML IM SUSP] 1 each     Sig: INJECT 0.5 ML INTO THE MUSCLE ONCE FOR 1 DOSE.     Off-Protocol Failed - 07/28/2023 12:31 PM      Failed - Medication not assigned to a protocol, review manually.      Passed - Valid encounter within last 12 months    Recent Outpatient Visits           2 months ago Recurrent sinusitis   [facility] Jackolyn Confer, MD   3 months ago Centrilobular emphysema [facility])   [place] [facility] [place], Corrie Dandy T, NP   4 months ago Centrilobular emphysema [facility])   [place] [facility] [place], Corrie Dandy T, NP   9 months ago Centrilobular emphysema [facility])   [place] [facility] [place], Corrie Dandy T, NP   1 year ago Centrilobular emphysema [facility])   [place] [facility] Marjie Skiff, NP

## 2023-08-09 ENCOUNTER — Ambulatory Visit

## 2023-08-15 ENCOUNTER — Ambulatory Visit (INDEPENDENT_AMBULATORY_CARE_PROVIDER_SITE_OTHER): Payer: Medicare HMO | Admitting: Emergency Medicine

## 2023-08-15 VITALS — Ht 66.0 in | Wt 133.0 lb

## 2023-08-15 DIAGNOSIS — F17210 Nicotine dependence, cigarettes, uncomplicated: Secondary | ICD-10-CM | POA: Diagnosis not present

## 2023-08-15 DIAGNOSIS — Z0000 Encounter for general adult medical examination without abnormal findings: Secondary | ICD-10-CM

## 2023-08-15 NOTE — Patient Instructions (Addendum)
 Caitlyn May , Thank you for taking time to come for your Medicare Wellness Visit. I appreciate your ongoing commitment to your health goals. Please review the following plan we discussed and let me know if I can assist you in the future.   Referrals/Orders/Follow-Ups/Clinician Recommendations:   Call [facility] @ [phone] to schedule your mammogram and bone density test at your earliest convenience. I have placed a referral to [facility] to evaluate for the need to have a low dose lung CT to screen for lung cancer. Someone from their office will call you. I made you an appointment with Aura Dials, NP for 08/23/23 @ 3:00pm (arrive 15 minutes early)  This is a list of the screening recommended for you and due dates:  Health Maintenance  Topic Date Due   Screening for Lung Cancer  08/14/2020   COVID-19 Vaccine (1 - 2024-25 season) Never done   Zoster (Shingles) Vaccine (2 of 2) 06/16/2023   Mammogram  10/14/2023*   DEXA scan (bone density measurement)  10/14/2023*   Colon Cancer Screening  10/14/2023*   Medicare Annual Wellness Visit  08/14/2024   Pneumonia Vaccine  Completed   Flu Shot  Completed   Hepatitis C Screening  Completed   HPV Vaccine  Aged Out   DTaP/Tdap/Td vaccine  Discontinued  *Topic was postponed. The date shown is not the original due date.    Advanced directives: (Provided) Advance directive discussed with you today. I have provided a copy for you to complete at home and have notarized. Once this is complete, please bring a copy in to our office so we can scan it into your chart.   Next Medicare Annual Wellness Visit scheduled for next year: Yes, 08/06/24 @ 1:50pm (phone visit)  Fall Prevention in the Home, Adult Falls can cause injuries and affect people of all ages. There are many simple things that you can do to make your home safe and to help prevent falls. If you need it, ask for help making these changes. What actions can I take to  prevent falls? General information Use good lighting in all rooms. Make sure to: Replace any light bulbs that burn out. Turn on lights if it is dark and use night-lights. Keep items that you use often in easy-to-reach places. Lower the shelves around your home if needed. Move furniture so that there are clear paths around it. Do not keep throw rugs or other things on the floor that can make you trip. If any of your floors are uneven, fix them. Add color or contrast paint or tape to clearly mark and help you see: Grab bars or handrails. First and last steps of staircases. Where the edge of each step is. If you use a ladder or stepladder: Make sure that it is fully opened. Do not climb a closed ladder. Make sure the sides of the ladder are locked in place. Have someone hold the ladder while you use it. Know where your pets are as you move through your home. What can I do in the bathroom?     Keep the floor dry. Clean up any water that is on the floor right away. Remove soap buildup in the bathtub or shower. Buildup makes bathtubs and showers slippery. Use non-skid mats or decals on the floor of the bathtub or shower. Attach bath mats securely with double-sided, non-slip rug tape. If you need to sit down while you are in the shower, use a non-slip stool. Install grab bars by  the toilet and in the bathtub and shower. Do not use towel bars as grab bars. What can I do in the bedroom? Make sure that you have a light by your bed that is easy to reach. Do not use any sheets or blankets on your bed that hang to the floor. Have a firm bench or chair with side arms that you can use for support when you get dressed. What can I do in the kitchen? Clean up any spills right away. If you need to reach something above you, use a sturdy step stool that has a grab bar. Keep electrical cables out of the way. Do not use floor polish or wax that makes floors slippery. What can I do with my stairs? Do  not leave anything on the stairs. Make sure that you have a light switch at the top and the bottom of the stairs. Have them installed if you do not have them. Make sure that there are handrails on both sides of the stairs. Fix handrails that are broken or loose. Make sure that handrails are as long as the staircases. Install non-slip stair treads on all stairs in your home if they do not have carpet. Avoid having throw rugs at the top or bottom of stairs, or secure the rugs with carpet tape to prevent them from moving. Choose a carpet design that does not hide the edge of steps on the stairs. Make sure that carpet is firmly attached to the stairs. Fix any carpet that is loose or worn. What can I do on the outside of my home? Use bright outdoor lighting. Repair the edges of walkways and driveways and fix any cracks. Clear paths of anything that can make you trip, such as tools or rocks. Add color or contrast paint or tape to clearly mark and help you see high doorway thresholds. Trim any bushes or trees on the main path into your home. Check that handrails are securely fastened and in good repair. Both sides of all steps should have handrails. Install guardrails along the edges of any raised decks or porches. Have leaves, snow, and ice cleared regularly. Use sand, salt, or ice melt on walkways during winter months if you live where there is ice and snow. In the garage, clean up any spills right away, including grease or oil spills. What other actions can I take? Review your medicines with your health care provider. Some medicines can make you confused or feel dizzy. This can increase your chance of falling. Wear closed-toe shoes that fit well and support your feet. Wear shoes that have rubber soles and low heels. Use a cane, walker, scooter, or crutches that help you move around if needed. Talk with your provider about other ways that you can decrease your risk of falls. This may include seeing a  physical therapist to learn to do exercises to improve movement and strength. Where to find more information Centers for Disease Control and Prevention, [employer]: TonerPromos.no [employer] on Aging: BaseRingTones.pl National Institute on Aging: BaseRingTones.pl Contact a health care provider if: You are afraid of falling at home. You feel weak, drowsy, or dizzy at home. You fall at home. Get help right away if you: Lose consciousness or have trouble moving after a fall. Have a fall that causes a head injury. These symptoms may be an emergency. Get help right away. Call 911. Do not wait to see if the symptoms will go away. Do not drive yourself to the hospital. This information  is not intended to replace advice given to you by your health care provider. Make sure you discuss any questions you have with your health care provider. Document Revised: 01/17/2022 Document Reviewed: 01/17/2022 Elsevier Patient Education  2024 [employer].

## 2023-08-15 NOTE — Progress Notes (Signed)
 Subjective:   Caitlyn May is a 68 y.o. who presents for a Medicare Wellness preventive visit.  Visit Complete: Virtual I connected with  Florence Canner on 08/15/23 by a audio enabled telemedicine application and verified that I am speaking with the correct person using two identifiers.  Patient Location: Home  Provider Location: Office/Clinic  I discussed the limitations of evaluation and management by telemedicine. The patient expressed understanding and agreed to proceed.  Vital Signs: Because this visit was a virtual/telehealth visit, some criteria may be missing or patient reported. Any vitals not documented were not able to be obtained and vitals that have been documented are patient reported.  VideoDeclined- This patient declined Librarian, academic. Therefore the visit was completed with audio only.  Persons Participating in Visit: Patient.  AWV Questionnaire: No: Patient Medicare AWV questionnaire was not completed prior to this visit.  Cardiac Risk Factors include: advanced age (>17men, >71 women);dyslipidemia;hypertension;smoking/ tobacco exposure     Objective:    Today's Vitals   08/15/23 1131 08/15/23 1134  Weight: 133 lb (60.3 kg)   Height: 5\' 6"  (1.676 m)   PainSc:  6    Body mass index is 21.47 kg/m.     08/15/2023   11:51 AM 08/09/2022    1:38 PM 06/22/2021    9:25 AM 06/22/2021    9:08 AM 06/12/2020    2:33 PM 06/10/2019    4:00 PM 06/25/2018    4:21 PM  Advanced Directives  Does Patient Have a Medical Advance Directive? No No No No No No No  Would patient like information on creating a medical advance directive? Yes (MAU/Ambulatory/Procedural Areas - Information given) No - Patient declined No - Patient declined No - Patient declined  Yes (MAU/Ambulatory/Procedural Areas - Information given)     Current Medications (verified) Outpatient Encounter Medications as of 08/15/2023  Medication Sig   albuterol (PROVENTIL) (2.5  MG/3ML) 0.083% nebulizer solution Take 3 mLs (2.5 mg total) by nebulization every 6 (six) hours as needed for wheezing or shortness of breath.   albuterol (VENTOLIN HFA) 108 (90 Base) MCG/ACT inhaler Inhale 2 puffs into the lungs every 6 (six) hours as needed for wheezing or shortness of breath.   amLODipine (NORVASC) 5 MG tablet Take 1 tablet (5 mg total) by mouth daily.   atorvastatin (LIPITOR) 40 MG tablet Take 1 tablet (40 mg total) by mouth daily.   buPROPion (WELLBUTRIN XL) 150 MG 24 hr tablet Take 1 tablet (150 mg total) by mouth daily.   carvedilol (COREG) 12.5 MG tablet Take 1 tablet (12.5 mg total) by mouth 2 (two) times daily.   fluticasone (FLONASE) 50 MCG/ACT nasal spray Place 2 sprays into both nostrils daily.   Fluticasone-Umeclidin-Vilant (TRELEGY ELLIPTA) 100-62.5-25 MCG/ACT AEPB Inhale 1 puff into the lungs daily.   gabapentin (NEURONTIN) 600 MG tablet Take 1 tablet (600 mg total) by mouth 3 (three) times daily.   hydrOXYzine (VISTARIL) 25 MG capsule Take 1 capsule (25 mg total) by mouth every 8 (eight) hours as needed.   megestrol (MEGACE) 40 MG tablet Take 1 tablet (40 mg total) by mouth 4 (four) times daily as needed.   olmesartan (BENICAR) 40 MG tablet Take 1 tablet (40 mg total) by mouth daily.   pantoprazole (PROTONIX) 40 MG tablet Take 1 tablet (40 mg total) by mouth at bedtime.   rosuvastatin (CRESTOR) 40 MG tablet Take 1 tablet (40 mg total) by mouth daily.   sertraline (ZOLOFT) 100 MG tablet Take 1 tablet (  100 mg total) by mouth daily.   tiZANidine (ZANAFLEX) 4 MG tablet TAKE 1 TABLET(4 MG) BY MOUTH EVERY 6 HOURS AS NEEDED FOR MUSCLE SPASMS   traZODone (DESYREL) 50 MG tablet Take 0.5-1 tablets (25-50 mg total) by mouth at bedtime as needed for sleep.   Vitamin D, Ergocalciferol, (DRISDOL) 1.25 MG (50000 UNIT) CAPS capsule Take 1 capsule (50,000 Units total) by mouth every 7 (seven) days.   No facility-administered encounter medications on file as of 08/15/2023.     Allergies (verified) Accupril [quinapril hcl], Avelox [moxifloxacin], Ciprofloxacin, Hctz [hydrochlorothiazide], Penicillins, and Sulfa antibiotics   History: Past Medical History:  Diagnosis Date   Acute rhinosinusitis 07/23/2019   Chronic kidney disease    Depression    Failed back syndrome, lumbar    Hyperlipidemia    Hypertension    Osteoporosis    Weight loss    Past Surgical History:  Procedure Laterality Date   ABDOMINAL HYSTERECTOMY     CESAREAN SECTION     MOUTH SURGERY     SPINE SURGERY     Family History  Problem Relation Age of Onset   Hypertension Mother    Dementia Mother    Cancer Father    Cancer Maternal Aunt        breast   Diabetes Neg Hx    Heart disease Neg Hx    Stroke Neg Hx    COPD Neg Hx    Social History   Socioeconomic History   Marital status: Divorced    Spouse name: Not on file   Number of children: 0   Years of education: 10   Highest education level: 10th grade  Occupational History   Occupation: disability  Tobacco Use   Smoking status: Every Day    Current packs/day: 1.00    Average packs/day: 2.0 packs/day for 54.2 years (106.2 ttl pk-yrs)    Types: Cigarettes    Start date: 1971   Smokeless tobacco: Never   Tobacco comments:    1PPD 03/01/2022  Vaping Use   Vaping status: Never Used  Substance and Sexual Activity   Alcohol use: No   Drug use: Not Currently    Types: Marijuana    Comment: last week estimated 05/10/2019   Sexual activity: Not on file  Other Topics Concern   Not on file  Social History Narrative   Not on file   Social Drivers of Health   Financial Resource Strain: Medium Risk (08/15/2023)   Overall Financial Resource Strain (CARDIA)    Difficulty of Paying Living Expenses: Somewhat hard  Food Insecurity: No Food Insecurity (08/15/2023)   Hunger Vital Sign    Worried About Running Out of Food in the Last Year: Never true    Ran Out of Food in the Last Year: Never true  Transportation Needs:  No Transportation Needs (08/15/2023)   PRAPARE - Administrator, Civil Service (Medical): No    Lack of Transportation (Non-Medical): No  Physical Activity: Inactive (08/15/2023)   Exercise Vital Sign    Days of Exercise per Week: 0 days    Minutes of Exercise per Session: 0 min  Stress: Stress Concern Present (08/15/2023)   [employer] of Occupational Health - Occupational Stress Questionnaire    Feeling of Stress : To some extent  Social Connections: Socially Isolated (08/15/2023)   Social Connection and Isolation Panel [NHANES]    Frequency of Communication with Friends and Family: Twice a week    Frequency of Social Gatherings with  Friends and Family: Never    Attends Religious Services: Never    Database administrator or Organizations: No    Attends Engineer, structural: Never    Marital Status: Divorced    Tobacco Counseling Ready to quit: Not Answered Counseling given: Not Answered Tobacco comments: 1PPD 03/01/2022    Clinical Intake:  Pre-visit preparation completed: Yes  Pain : 0-10 Pain Score: 6  Pain Type: Chronic pain Pain Location: Back Pain Descriptors / Indicators: Aching     BMI - recorded: 21.47 Nutritional Status: BMI of 19-24  Normal Nutritional Risks: Nausea/ vomitting/ diarrhea (nausea this morning - resolved) Diabetes: No  How often do you need to have someone help you when you read instructions, pamphlets, or other written materials from your doctor or pharmacy?: 1 - Never  Interpreter Needed?: No  Information entered by :: Tora Kindred, CMA   Activities of Daily Living     08/15/2023   11:35 AM  In your present state of health, do you have any difficulty performing the following activities:  Hearing? 0  Vision? 0  Difficulty concentrating or making decisions? 0  Walking or climbing stairs? 1  Comment cane  Dressing or bathing? 0  Doing errands, shopping? 0  Preparing Food and eating ? N  Using the Toilet? N   In the past six months, have you accidently leaked urine? Y  Comment wears pad  Do you have problems with loss of bowel control? Y  Comment recommended depends  Managing your Medications? N  Managing your Finances? N  Housekeeping or managing your Housekeeping? N    Patient Care Team: Marjie Skiff, NP as PCP - General (Nurse Practitioner) Debbe Odea, MD as PCP - Cardiology (Cardiology)  Indicate any recent Medical Services you may have received from other than [facility] providers in the past year (date may be approximate).     Assessment:   This is a routine wellness examination for [place].  Hearing/Vision screen Hearing Screening - Comments:: Denies hearing loss Vision Screening - Comments:: Gets routine eye exams, [facility], [place]   Goals Addressed             This Visit's Progress    Exercise 3x per week (30 min per time)         Depression Screen     08/15/2023   11:48 AM 04/17/2023    2:48 PM 04/17/2023    2:23 PM 10/14/2022    1:30 PM 08/09/2022    1:35 PM 04/15/2022    9:53 AM 03/21/2022    3:57 PM  PHQ 2/9 Scores  PHQ - 2 Score 2 2  3 2 3 2   PHQ- 9 Score 3 4  7 4 15 3   Exception Documentation   Patient refusal        Fall Risk     08/15/2023   11:52 AM 03/03/2023    4:14 PM 08/09/2022    1:39 PM 04/15/2022    9:53 AM 11/03/2021    2:01 PM  Fall Risk   Falls in the past year? 1 1 1  0 1  Number falls in past yr: 1 1 1  0 1  Injury with Fall? 1 0 0 0 0  Risk for fall due to : History of fall(s);Impaired balance/gait;Orthopedic patient  History of fall(s) No Fall Risks History of fall(s)  Follow up Falls prevention discussed;Falls evaluation completed;Education provided  Falls prevention discussed;Falls evaluation completed Falls evaluation completed Falls evaluation completed  MEDICARE RISK AT HOME:  Medicare Risk at Home Any stairs in or around the home?: Yes If so, are there any without handrails?: No Home free of loose  throw rugs in walkways, pet beds, electrical cords, etc?: Yes Adequate lighting in your home to reduce risk of falls?: Yes Life alert?: No Use of a cane, walker or w/c?: Yes (cane) Grab bars in the bathroom?: No Shower chair or bench in shower?: Yes Elevated toilet seat or a handicapped toilet?: No  TIMED UP AND GO:  Was the test performed?  No  Cognitive Function: 6CIT completed        08/15/2023   11:54 AM 08/09/2022    1:48 PM 06/12/2020    2:40 PM 06/10/2019    3:56 PM 05/03/2018    1:52 PM  6CIT Screen  What Year? 0 points 0 points 0 points 0 points 0 points  What month? 0 points 0 points 0 points 0 points 0 points  What time? 0 points 0 points 0 points 0 points 0 points  Count back from 20 0 points 0 points 0 points 0 points 0 points  Months in reverse 4 points 0 points 2 points 0 points 2 points  Repeat phrase 0 points 4 points 2 points 0 points 2 points  Total Score 4 points 4 points 4 points 0 points 4 points    Immunizations Immunization History  Administered Date(s) Administered   Fluad Quad(high Dose 65+) 03/23/2020, 05/07/2021, 04/06/2022   Fluad Trivalent(High Dose 65+) 04/17/2023   Influenza, High Dose Seasonal PF 04/14/2017   Influenza,inj,Quad PF,6+ Mos 03/31/2015, 02/10/2016, 03/30/2018, 06/10/2019   Influenza-Unspecified 03/11/2014   Pneumococcal Conjugate-13 03/23/2020   Pneumococcal Polysaccharide-23 05/07/2021   Pneumococcal-Unspecified 11/01/2007   Td 11/01/2007   Zoster Recombinant(Shingrix) 04/21/2023    Screening Tests Health Maintenance  Topic Date Due   COVID-19 Vaccine (1 - 2024-25 season) Never done   Zoster Vaccines- Shingrix (2 of 2) 06/16/2023   MAMMOGRAM  10/14/2023 (Originally 09/20/2004)   DEXA SCAN  10/14/2023 (Originally 09/21/2019)   Colonoscopy  10/14/2023 (Originally 09/21/1999)   Medicare Annual Wellness (AWV)  08/14/2024   Pneumonia Vaccine 41+ Years old  Completed   INFLUENZA VACCINE  Completed   Hepatitis C Screening   Completed   HPV VACCINES  Aged Out   DTaP/Tdap/Td  Discontinued    Health Maintenance  Health Maintenance Due  Topic Date Due   COVID-19 Vaccine (1 - 2024-25 season) Never done   Zoster Vaccines- Shingrix (2 of 2) 06/16/2023   Health Maintenance Items Addressed: Referral sent to [facility] (smoker/hx smoking), See Nurse Notes  Additional Screening:  Vision Screening: Recommended annual ophthalmology exams for early detection of glaucoma and other disorders of the eye.  Dental Screening: Recommended annual dental exams for proper oral hygiene  Community Resource Referral / Chronic Care Management: CRR required this visit?  No   CCM required this visit?  No     Plan:     I have personally reviewed and noted the following in the patient's chart:   Medical and social history Use of alcohol, tobacco or illicit drugs  Current medications and supplements including opioid prescriptions. Patient is not currently taking opioid prescriptions. Functional ability and status Nutritional status Physical activity Advanced directives List of other physicians Hospitalizations, surgeries, and ER visits in previous 12 months Vitals Screenings to include cognitive, depression, and falls Referrals and appointments  In addition, I have reviewed and discussed with patient certain preventive protocols, quality metrics, and  best practice recommendations. A written personalized care plan for preventive services as well as general preventive health recommendations were provided to patient.     Tora Kindred, CMA   08/15/2023   After Visit Summary: (Mail) Due to this being a telephonic visit, the after visit summary with patients personalized plan was offered to patient via mail   Notes:  6 CIT Score - 4 Needs 2nd Shingles vaccine Needs Tdap Placed referral to [facility] for LDCT eval. Gave phone # to [facility] to schedule MMG and DEXA (orders placed  04/17/23) Made OV for 08/23/23 for 8 week f/u from last OV 04/17/23. Patient c/o loss of bowel control. Declined colonoscopy Declined Covid vaccine

## 2023-08-21 ENCOUNTER — Other Ambulatory Visit: Payer: Self-pay | Admitting: Nurse Practitioner

## 2023-08-23 ENCOUNTER — Ambulatory Visit: Admitting: Nurse Practitioner

## 2023-08-23 DIAGNOSIS — I10 Essential (primary) hypertension: Secondary | ICD-10-CM

## 2023-08-23 DIAGNOSIS — K219 Gastro-esophageal reflux disease without esophagitis: Secondary | ICD-10-CM

## 2023-08-23 DIAGNOSIS — E559 Vitamin D deficiency, unspecified: Secondary | ICD-10-CM

## 2023-08-23 DIAGNOSIS — I739 Peripheral vascular disease, unspecified: Secondary | ICD-10-CM

## 2023-08-23 DIAGNOSIS — E782 Mixed hyperlipidemia: Secondary | ICD-10-CM

## 2023-08-23 DIAGNOSIS — J432 Centrilobular emphysema: Secondary | ICD-10-CM

## 2023-08-23 DIAGNOSIS — R7989 Other specified abnormal findings of blood chemistry: Secondary | ICD-10-CM

## 2023-08-23 DIAGNOSIS — F339 Major depressive disorder, recurrent, unspecified: Secondary | ICD-10-CM

## 2023-08-23 DIAGNOSIS — I700 Atherosclerosis of aorta: Secondary | ICD-10-CM

## 2023-08-23 DIAGNOSIS — I77810 Thoracic aortic ectasia: Secondary | ICD-10-CM

## 2023-08-23 NOTE — Telephone Encounter (Signed)
 Requested Prescriptions  Pending Prescriptions Disp Refills   amLODipine (NORVASC) 5 MG tablet [Pharmacy Med Name: AMLODIPINE BESYLATE 5 MG TAB] 90 tablet 1    Sig: TAKE 1 TABLET BY MOUTH DAILY     Cardiovascular: Calcium Channel Blockers 2 Passed - 08/23/2023  9:58 AM      Passed - Last BP in normal range    BP Readings from Last 1 Encounters:  05/16/23 115/78         Passed - Last Heart Rate in normal range    Pulse Readings from Last 1 Encounters:  05/16/23 99         Passed - Valid encounter within last 6 months    Recent Outpatient Visits           3 months ago Recurrent sinusitis   [facility] Jackolyn Confer, MD   4 months ago Centrilobular emphysema [facility])   [place] [place], Corrie Dandy T, NP   5 months ago Centrilobular emphysema (HCC)   [facility] [place], Corrie Dandy T, NP   10 months ago Centrilobular emphysema (HCC)   [place] [facility] [place], Corrie Dandy T, NP   1 year ago Centrilobular emphysema [facility])    [facility] [place], Dorie Rank, NP

## 2023-09-13 ENCOUNTER — Telehealth (INDEPENDENT_AMBULATORY_CARE_PROVIDER_SITE_OTHER): Admitting: Nurse Practitioner

## 2023-09-13 ENCOUNTER — Ambulatory Visit: Admitting: Nurse Practitioner

## 2023-09-13 ENCOUNTER — Encounter: Payer: Self-pay | Admitting: Nurse Practitioner

## 2023-09-13 DIAGNOSIS — F5101 Primary insomnia: Secondary | ICD-10-CM | POA: Diagnosis not present

## 2023-09-13 DIAGNOSIS — F339 Major depressive disorder, recurrent, unspecified: Secondary | ICD-10-CM

## 2023-09-13 DIAGNOSIS — J432 Centrilobular emphysema: Secondary | ICD-10-CM

## 2023-09-13 DIAGNOSIS — I77810 Thoracic aortic ectasia: Secondary | ICD-10-CM

## 2023-09-13 DIAGNOSIS — G4700 Insomnia, unspecified: Secondary | ICD-10-CM | POA: Insufficient documentation

## 2023-09-13 DIAGNOSIS — R7989 Other specified abnormal findings of blood chemistry: Secondary | ICD-10-CM

## 2023-09-13 DIAGNOSIS — E559 Vitamin D deficiency, unspecified: Secondary | ICD-10-CM

## 2023-09-13 DIAGNOSIS — G894 Chronic pain syndrome: Secondary | ICD-10-CM

## 2023-09-13 DIAGNOSIS — I10 Essential (primary) hypertension: Secondary | ICD-10-CM

## 2023-09-13 DIAGNOSIS — I739 Peripheral vascular disease, unspecified: Secondary | ICD-10-CM

## 2023-09-13 DIAGNOSIS — I700 Atherosclerosis of aorta: Secondary | ICD-10-CM

## 2023-09-13 DIAGNOSIS — E782 Mixed hyperlipidemia: Secondary | ICD-10-CM

## 2023-09-13 MED ORDER — TRAZODONE HCL 50 MG PO TABS
25.0000 mg | ORAL_TABLET | Freq: Every evening | ORAL | 1 refills | Status: DC | PRN
Start: 2023-09-13 — End: 2024-02-14

## 2023-09-13 NOTE — Progress Notes (Signed)
 There were no vitals taken for this visit.   Subjective:    Patient ID: Caitlyn May, female    DOB: 1956-03-03, 68 y.o.   MRN: [id number]  HPI: Caitlyn May is a 68 y.o. female  Chief Complaint  Patient presents with   Insomnia   Virtual Visit via Video Note  I connected with Caitlyn May on 09/13/23 at  3:00 PM EDT by a video enabled telemedicine application and verified that I am speaking with the correct person using two identifiers.  Location: Patient: home Provider: work   I discussed the limitations of evaluation and management by telemedicine and the availability of in person appointments. The patient expressed understanding and agreed to proceed.  I discussed the assessment and treatment plan with the patient. The patient was provided an opportunity to ask questions and all were answered. The patient agreed with the plan and demonstrated an understanding of the instructions.   The patient was advised to call back or seek an in-person evaluation if the symptoms worsen or if the condition fails to improve as anticipated.  I provided 25 minutes of non-face-to-face time during this encounter.   Marjie Skiff, NP   INSOMNIA Started Trazodone for insomnia in November 2024.  Reports this is working. Duration: chronic Satisfied with sleep quality: yes Difficulty falling asleep: no Difficulty staying asleep: no Waking a few hours after sleep onset: no Early morning awakenings: no Daytime hypersomnolence: no Wakes feeling refreshed: yes Good sleep hygiene: yes Apnea: no Snoring: no Depressed/anxious mood: no Recent stress: no Restless legs/nocturnal leg cramps: no Chronic pain/arthritis: yes History of sleep study: no Treatments attempted: melatonin and Trazodone    Relevant past medical, surgical, family and social history reviewed and updated as indicated. Interim medical history since our last visit reviewed. Allergies and medications reviewed  and updated.  Review of Systems  Constitutional:  Negative for activity change, appetite change, diaphoresis, fatigue and fever.  Respiratory:  Positive for cough. Negative for chest tightness, shortness of breath and wheezing.   Cardiovascular:  Negative for chest pain, palpitations and leg swelling.  Neurological: Negative.   Psychiatric/Behavioral:  Positive for sleep disturbance. Negative for decreased concentration and self-injury. The patient is nervous/anxious.     Per HPI unless specifically indicated above     Objective:    There were no vitals taken for this visit.  Wt Readings from Last 3 Encounters:  08/15/23 133 lb (60.3 kg)  05/16/23 137 lb 12.8 oz (62.5 kg)  04/17/23 139 lb 12.8 oz (63.4 kg)    Physical Exam Vitals and nursing note reviewed.  Constitutional:      General: She is awake. She is not in acute distress.    Appearance: She is well-developed. She is not ill-appearing.  HENT:     Head: Normocephalic.     Right Ear: Hearing normal.     Left Ear: Hearing normal.  Eyes:     General: Lids are normal.        Right eye: No discharge.        Left eye: No discharge.     Conjunctiva/sclera: Conjunctivae normal.  Pulmonary:     Effort: Pulmonary effort is normal. No accessory muscle usage or respiratory distress.  Musculoskeletal:     Cervical back: Normal range of motion.  Neurological:     Mental Status: She is alert and oriented to person, place, and time.  Psychiatric:        Attention and Perception: Attention normal.  Mood and Affect: Mood normal.        Behavior: Behavior normal. Behavior is cooperative.        Thought Content: Thought content normal.        Judgment: Judgment normal.     Results for orders placed or performed in visit on 05/16/23  Rapid Strep Screen ([facility] ONLY)   Collection Time: 05/16/23  1:52 PM   Specimen: Other   Other  Result Value Ref Range   Strep Gp A Ag, IA W/Reflex Negative Negative  Culture,  Group A Strep   Collection Time: 05/16/23  1:52 PM   Other  Result Value Ref Range   Strep A Culture Negative   Veritor Flu A/B Waived   Collection Time: 05/16/23  1:52 PM  Result Value Ref Range   Influenza A Negative Negative   Influenza B Negative Negative  Novel Coronavirus, NAA ([facility])   Collection Time: 05/16/23  1:58 PM   Specimen: Nasopharyngeal(NP) swabs in vial transport medium  Result Value Ref Range   SARS-CoV-2, NAA Not Detected Not Detected      Assessment & Plan:   Problem List Items Addressed This Visit       Other   Insomnia - Primary   Chronic and improved with Trazodone on board.  Will continue this and send in refills.  Recommend: ?Maintain a regular sleep schedule, particularly a regular wake-up time in the morning ?Try not to force sleep ?Avoid caffeinated beverages after lunch ?Avoid alcohol near bedtime (eg, late afternoon and evening)  ?Avoid smoking or other nicotine intake, particularly during the evening ?Adjust the bedroom environment as needed to decrease stimuli (eg, reduce ambient light, turn off the television or radio) ?Avoid prolonged use of light-emitting screens (laptops, tablets, smartphones, ebooks) before bedtime  ?Resolve concerns or worries before bedtime ?Exercise regularly for at least 20 minutes, preferably more than four to five hours prior to bedtime  ?Avoid daytime naps, especially if they are longer than 20 to 30 minutes or occur late in the day         Follow up plan: Return in about 10 weeks (around 11/22/2023) for HTN/HLD, Depression, COPD, Hypothyroid.

## 2023-09-13 NOTE — Assessment & Plan Note (Signed)
 Chronic and improved with Trazodone on board.  Will continue this and send in refills.  Recommend: ?Maintain a regular sleep schedule, particularly a regular wake-up time in the morning ?Try not to force sleep ?Avoid caffeinated beverages after lunch ?Avoid alcohol near bedtime (eg, late afternoon and evening)  ?Avoid smoking or other nicotine intake, particularly during the evening ?Adjust the bedroom environment as needed to decrease stimuli (eg, reduce ambient light, turn off the television or radio) ?Avoid prolonged use of light-emitting screens (laptops, tablets, smartphones, ebooks) before bedtime  ?Resolve concerns or worries before bedtime ?Exercise regularly for at least 20 minutes, preferably more than four to five hours prior to bedtime  ?Avoid daytime naps, especially if they are longer than 20 to 30 minutes or occur late in the day

## 2023-09-13 NOTE — Patient Instructions (Signed)
 Insomnia Insomnia is a sleep disorder that makes it difficult to fall asleep or stay asleep. Insomnia can cause fatigue, low energy, difficulty concentrating, mood swings, and poor performance at work or school. There are three different ways to classify insomnia: Difficulty falling asleep. Difficulty staying asleep. Waking up too early in the morning. Any type of insomnia can be long-term (chronic) or short-term (acute). Both are common. Short-term insomnia usually lasts for 3 months or less. Chronic insomnia occurs at least three times a week for longer than 3 months. What are the causes? Insomnia may be caused by another condition, situation, or substance, such as: Having certain mental health conditions, such as anxiety and depression. Using caffeine, alcohol, tobacco, or drugs. Having gastrointestinal conditions, such as gastroesophageal reflux disease (GERD). Having certain medical conditions. These include: Asthma. Alzheimer's disease. Stroke. Chronic pain. An overactive thyroid gland (hyperthyroidism). Other sleep disorders, such as restless legs syndrome and sleep apnea. Menopause. Sometimes, the cause of insomnia may not be known. What increases the risk? Risk factors for insomnia include: Gender. Females are affected more often than males. Age. Insomnia is more common as people get older. Stress and certain medical and mental health conditions. Lack of exercise. Having an irregular work schedule. This may include working night shifts and traveling between different time zones. What are the signs or symptoms? If you have insomnia, the main symptom is having trouble falling asleep or having trouble staying asleep. This may lead to other symptoms, such as: Feeling tired or having low energy. Feeling nervous about going to sleep. Not feeling rested in the morning. Having trouble concentrating. Feeling irritable, anxious, or depressed. How is this diagnosed? This condition  may be diagnosed based on: Your symptoms and medical history. Your health care provider may ask about: Your sleep habits. Any medical conditions you have. Your mental health. A physical exam. How is this treated? Treatment for insomnia depends on the cause. Treatment may focus on treating an underlying condition that is causing the insomnia. Treatment may also include: Medicines to help you sleep. Counseling or therapy. Lifestyle adjustments to help you sleep better. Follow these instructions at home: Eating and drinking  Limit or avoid alcohol, caffeinated beverages, and products that contain nicotine and tobacco, especially close to bedtime. These can disrupt your sleep. Do not eat a large meal or eat spicy foods right before bedtime. This can lead to digestive discomfort that can make it hard for you to sleep. Sleep habits  Keep a sleep diary to help you and your health care provider figure out what could be causing your insomnia. Write down: When you sleep. When you wake up during the night. How well you sleep and how rested you feel the next day. Any side effects of medicines you are taking. What you eat and drink. Make your bedroom a dark, comfortable place where it is easy to fall asleep. Put up shades or blackout curtains to block light from outside. Use a white noise machine to block noise. Keep the temperature cool. Limit screen use before bedtime. This includes: Not watching TV. Not using your smartphone, tablet, or computer. Stick to a routine that includes going to bed and waking up at the same times every day and night. This can help you fall asleep faster. Consider making a quiet activity, such as reading, part of your nighttime routine. Try to avoid taking naps during the day so that you sleep better at night. Get out of bed if you are still awake after  15 minutes of trying to sleep. Keep the lights down, but try reading or doing a quiet activity. When you feel  sleepy, go back to bed. General instructions Take over-the-counter and prescription medicines only as told by your health care provider. Exercise regularly as told by your health care provider. However, avoid exercising in the hours right before bedtime. Use relaxation techniques to manage stress. Ask your health care provider to suggest some techniques that may work well for you. These may include: Breathing exercises. Routines to release muscle tension. Visualizing peaceful scenes. Make sure that you drive carefully. Do not drive if you feel very sleepy. Keep all follow-up visits. This is important. Contact a health care provider if: You are tired throughout the day. You have trouble in your daily routine due to sleepiness. You continue to have sleep problems, or your sleep problems get worse. Get help right away if: You have thoughts about hurting yourself or someone else. Get help right away if you feel like you may hurt yourself or others, or have thoughts about taking your own life. Go to your nearest emergency room or: Call 911. Call the National Suicide Prevention Lifeline at (906)021-1611 or 988. This is open 24 hours a day. Text the Crisis Text Line at 325-069-2793. Summary Insomnia is a sleep disorder that makes it difficult to fall asleep or stay asleep. Insomnia can be long-term (chronic) or short-term (acute). Treatment for insomnia depends on the cause. Treatment may focus on treating an underlying condition that is causing the insomnia. Keep a sleep diary to help you and your health care provider figure out what could be causing your insomnia. This information is not intended to replace advice given to you by your health care provider. Make sure you discuss any questions you have with your health care provider. Document Revised: 04/26/2021 Document Reviewed: 04/26/2021 Elsevier Patient Education  2024 [employer].

## 2023-09-14 NOTE — Progress Notes (Signed)
 An appointment has been made left message on machine and sent reminder in the mail

## 2023-10-27 ENCOUNTER — Ambulatory Visit: Payer: Self-pay

## 2023-10-27 NOTE — Telephone Encounter (Signed)
  Chief Complaint: shingles rash Symptoms: shingles blistered rash, nausea, diarrhea, headaches, fever Frequency: x 2 days Pertinent Negatives: Patient denies chest pain, SOB Disposition: [] ED /[] Urgent Care (no appt availability in office) / [x] Appointment(In office/virtual)/ []  [facility]/ [] Home Care/ [x] Refused Recommended Disposition /[] [facility]/ []  Follow-up with PCP Additional Notes: Patient states she is able to tolerate PO liquids and food (Sprite and saltine crackers). Patient states she has left over triamcinolone  topical cream and is asking if that would treat her shingles, and if she can use it. Offered patient appointment with Dr Lincoln Renshaw this afternoon, patient states due to her diarrhea she doesn't want to come in for in-person. Mychart not activated for patient, unable to set up virtual visit. Patient requesting medication from PCP.  Copied from CRM [id number]. Topic: Clinical - Medical Advice >> Oct 27, 2023 10:24 AM Chrystal Crape R wrote: Pt calling Had shingles in past, end of last year she had the shingles vaccine and booster, would like to know the odds of getting it after the vaccine.  She believes she may be have symptoms of it. Reason for Disposition  Fever > 100.4 F (38.0 C)  Answer Assessment - Initial Assessment Questions 1. APPEARANCE of RASH: "Describe the rash."      Blistered, redness.  2. LOCATION: "Where is the rash located?"      Neck, right side.  3. ONSET: "When did the rash start?"      10/25/23.  4. ITCHING: "Does the rash itch?" If Yes, ask: "How bad is the itch?"  (Scale 1-10; or mild, moderate, severe)     Yes, 6/10.  5. PAIN: "Does the rash hurt?" If Yes, ask: "How bad is the pain?"  (Scale 0-10; or none, mild, moderate, severe)    - NONE (0): no pain    - MILD (1-3): doesn't interfere with normal activities     - MODERATE (4-7): interferes with normal activities or awakens from sleep     - SEVERE (8-10): excruciating pain,  unable to do any normal activities     Yes, mild.  6. OTHER SYMPTOMS: "Do you have any other symptoms?" (e.g., fever)     Fever 101 last night. Temperature this morning 99.8. Diarrhea (4 episodes today).  7. PREGNANCY: "Is there any chance you are pregnant?" "When was your last menstrual period?"     N/A.  Protocols used: Shingles (Zoster)-A-AH

## 2023-10-27 NOTE — Telephone Encounter (Signed)
 Per Jolene, please call and schedule patient an in office visit early next week. We cannot send in anything without an appointment.

## 2023-11-07 ENCOUNTER — Other Ambulatory Visit: Payer: Self-pay | Admitting: Nurse Practitioner

## 2023-11-07 NOTE — Telephone Encounter (Unsigned)
 Copied from CRM [id number]. Topic: Clinical - Medication Refill >> Nov 07, 2023  2:44 PM Carlatta H wrote: Medication: albuterol  (VENTOLIN  HFA) 108 (90 Base) MCG/ACT inhaler  Has the patient contacted their pharmacy? No (Agent: If no, request that the patient contact the pharmacy for the refill. If patient does not wish to contact the pharmacy document the reason why and proceed with request.) (Agent: If yes, when and what did the pharmacy advise?)  This is the patient's preferred pharmacy:  [facility] - [place], [place] - [address] [place] [address] Phone: [phone] Fax: [phone]  Is this the correct pharmacy for this prescription? Yes If no, delete pharmacy and type the correct one.   Has the prescription been filled recently? No  Is the patient out of the medication? Yes  Has the patient been seen for an appointment in the last year OR does the patient have an upcoming appointment? Yes  Can we respond through MyChart? No  Agent: Please be advised that Rx refills may take up to 3 business days. We ask that you follow-up with your pharmacy.

## 2023-11-08 MED ORDER — ALBUTEROL SULFATE HFA 108 (90 BASE) MCG/ACT IN AERS: 2.0000 | INHALATION_SPRAY | Freq: Four times a day (QID) | RESPIRATORY_TRACT | 0 refills | Status: DC | PRN

## 2023-11-08 NOTE — Telephone Encounter (Signed)
 Requested Prescriptions  Pending Prescriptions Disp Refills   albuterol  (VENTOLIN  HFA) 108 (90 Base) MCG/ACT inhaler 18 g 0    Sig: Inhale 2 puffs into the lungs every 6 (six) hours as needed for wheezing or shortness of breath.     Pulmonology:  Beta Agonists 2 Passed - 11/08/2023  4:06 PM      Passed - Last BP in normal range    BP Readings from Last 1 Encounters:  05/16/23 115/78         Passed - Last Heart Rate in normal range    Pulse Readings from Last 1 Encounters:  05/16/23 99         Passed - Valid encounter within last 12 months    Recent Outpatient Visits           1 month ago Primary insomnia   [place] [facility], Lavelle Posey, NP

## 2023-11-18 NOTE — Patient Instructions (Incomplete)
 COPD and Physical Activity Chronic obstructive pulmonary disease (COPD) is a long-term, or chronic, condition that affects the lungs. COPD is a general term that can be used to describe many problems that cause inflammation of the lungs and limit airflow. These conditions include chronic bronchitis and emphysema. The main symptom of COPD is shortness of breath, which makes it harder to do even simple tasks. This can also make it harder to exercise and stay active. Talk with your health care provider about treatments to help you breathe better and actions you can take to prevent breathing problems during physical activity. What are the benefits of exercising when you have COPD? Exercising regularly is an important part of a healthy lifestyle. You can still exercise and do physical activities even though you have COPD. Exercise and physical activity improve your shortness of breath by increasing blood flow (circulation). This causes your heart to pump more oxygen through your body. Moderate exercise can: Improve oxygen use. Increase your energy level. Help with shortness of breath. Strengthen your breathing muscles. Improve heart health. Help with sleep. Improve your self-esteem and feelings of self-worth. Lower depression, stress, and anxiety. Exercise can benefit everyone with COPD. The severity of your disease may affect how hard you can exercise, especially at first, but everyone can benefit. Talk with your health care provider about how much exercise is safe for you, and which activities and exercises are safe for you. What actions can I take to prevent breathing problems during physical activity? Sign up for a pulmonary rehabilitation program. This type of program may include: Education about lung diseases. Exercise classes that teach you how to exercise and be more active while improving your breathing. This usually involves: Exercise using your lower extremities, such as a stationary  bicycle. About 30 minutes of exercise, 2 to 5 times per week, for 6 to 12 weeks. Strength training, such as push-ups or leg lifts. Nutrition education. Group classes in which you can talk with others who also have COPD and learn ways to manage stress. If you use an oxygen tank, you should use it while you exercise. Work with your health care provider to adjust your oxygen for your physical activity. Your resting flow rate is different from your flow rate during physical activity. How to manage your breathing while exercising While you are exercising: Take slow breaths. Pace yourself, and do nottry to go too fast. Purse your lips while breathing out. Pursing your lips is similar to a kissing or whistling position. If doing exercise that uses a quick burst of effort, such as weight lifting: Breathe in before starting the exercise. Breathe out during the hardest part of the exercise, such as raising the weights. Where to find support You can find support for exercising with COPD from: Your health care provider. A pulmonary rehabilitation program. Your local health department or community health programs. Support groups, either online or in-person. Your health care provider may be able to recommend support groups. Where to find more information You can find more information about exercising with COPD from: American Lung Association: lung.org COPD Foundation: copdfoundation.org Contact a health care provider if: Your symptoms get worse. You have nausea. You have a fever. You want to start a new exercise program or a new activity. Get help right away if: You have chest pain. You cannot breathe. These symptoms may represent a serious problem that is an emergency. Do not wait to see if the symptoms will go away. Get medical help right away. Call  your local emergency services (911 in the [place] Do not drive yourself to the hospital. Summary COPD is a general term that can be used to describe  many different lung problems that cause lung inflammation and limit airflow. This includes chronic bronchitis and emphysema. Exercise and physical activity improve your shortness of breath by increasing blood flow (circulation). This causes your heart to provide more oxygen to your body. Contact your health care provider before starting any exercise program or new activity. Ask your health care provider what exercises and activities are safe for you. This information is not intended to replace advice given to you by your health care provider. Make sure you discuss any questions you have with your health care provider. Document Revised: 03/31/2023 Document Reviewed: 03/31/2023 Elsevier Patient Education  2024 [employer].

## 2023-11-23 ENCOUNTER — Ambulatory Visit: Admitting: Nurse Practitioner

## 2023-11-23 DIAGNOSIS — I10 Essential (primary) hypertension: Secondary | ICD-10-CM

## 2023-11-23 DIAGNOSIS — F5101 Primary insomnia: Secondary | ICD-10-CM

## 2023-11-23 DIAGNOSIS — F339 Major depressive disorder, recurrent, unspecified: Secondary | ICD-10-CM

## 2023-11-23 DIAGNOSIS — I700 Atherosclerosis of aorta: Secondary | ICD-10-CM

## 2023-11-23 DIAGNOSIS — I77810 Thoracic aortic ectasia: Secondary | ICD-10-CM

## 2023-11-23 DIAGNOSIS — I739 Peripheral vascular disease, unspecified: Secondary | ICD-10-CM

## 2023-11-23 DIAGNOSIS — E559 Vitamin D deficiency, unspecified: Secondary | ICD-10-CM

## 2023-11-23 DIAGNOSIS — G894 Chronic pain syndrome: Secondary | ICD-10-CM

## 2023-11-23 DIAGNOSIS — E782 Mixed hyperlipidemia: Secondary | ICD-10-CM

## 2023-11-23 DIAGNOSIS — R7989 Other specified abnormal findings of blood chemistry: Secondary | ICD-10-CM

## 2023-11-23 DIAGNOSIS — J432 Centrilobular emphysema: Secondary | ICD-10-CM

## 2024-02-14 ENCOUNTER — Ambulatory Visit (INDEPENDENT_AMBULATORY_CARE_PROVIDER_SITE_OTHER): Admitting: Pediatrics

## 2024-02-14 ENCOUNTER — Encounter: Payer: Self-pay | Admitting: Pediatrics

## 2024-02-14 ENCOUNTER — Ambulatory Visit: Payer: Self-pay | Admitting: Pediatrics

## 2024-02-14 VITALS — BP 108/69 | HR 71 | Temp 97.9°F | Wt 121.0 lb

## 2024-02-14 DIAGNOSIS — Z1211 Encounter for screening for malignant neoplasm of colon: Secondary | ICD-10-CM

## 2024-02-14 DIAGNOSIS — Z23 Encounter for immunization: Secondary | ICD-10-CM | POA: Diagnosis not present

## 2024-02-14 DIAGNOSIS — F5101 Primary insomnia: Secondary | ICD-10-CM

## 2024-02-14 DIAGNOSIS — Z122 Encounter for screening for malignant neoplasm of respiratory organs: Secondary | ICD-10-CM | POA: Diagnosis not present

## 2024-02-14 DIAGNOSIS — R5383 Other fatigue: Secondary | ICD-10-CM | POA: Diagnosis not present

## 2024-02-14 DIAGNOSIS — F419 Anxiety disorder, unspecified: Secondary | ICD-10-CM

## 2024-02-14 DIAGNOSIS — I10 Essential (primary) hypertension: Secondary | ICD-10-CM

## 2024-02-14 DIAGNOSIS — E782 Mixed hyperlipidemia: Secondary | ICD-10-CM

## 2024-02-14 DIAGNOSIS — G894 Chronic pain syndrome: Secondary | ICD-10-CM | POA: Diagnosis not present

## 2024-02-14 DIAGNOSIS — N949 Unspecified condition associated with female genital organs and menstrual cycle: Secondary | ICD-10-CM

## 2024-02-14 DIAGNOSIS — M7989 Other specified soft tissue disorders: Secondary | ICD-10-CM | POA: Insufficient documentation

## 2024-02-14 DIAGNOSIS — Z1231 Encounter for screening mammogram for malignant neoplasm of breast: Secondary | ICD-10-CM

## 2024-02-14 LAB — WET PREP FOR TRICH, YEAST, CLUE
Clue Cell Exam: NEGATIVE
Trichomonas Exam: NEGATIVE
Yeast Exam: NEGATIVE

## 2024-02-14 MED ORDER — TRAZODONE HCL 100 MG PO TABS: 150.0000 mg | ORAL_TABLET | Freq: Every evening | ORAL | 1 refills | Status: AC | PRN

## 2024-02-14 MED ORDER — TIZANIDINE HCL 4 MG PO TABS: ORAL_TABLET | ORAL | 3 refills | Status: AC

## 2024-02-14 MED ORDER — CARVEDILOL 12.5 MG PO TABS: 12.5000 mg | ORAL_TABLET | Freq: Two times a day (BID) | ORAL | 4 refills | Status: AC

## 2024-02-14 MED ORDER — HYDROXYZINE PAMOATE 25 MG PO CAPS
25.0000 mg | ORAL_CAPSULE | Freq: Three times a day (TID) | ORAL | 1 refills | Status: AC | PRN
Start: 2024-02-14 — End: ?

## 2024-02-14 MED ORDER — AMLODIPINE BESYLATE 5 MG PO TABS: 5.0000 mg | ORAL_TABLET | Freq: Every day | ORAL | 1 refills | Status: AC

## 2024-02-14 MED ORDER — OLMESARTAN MEDOXOMIL 40 MG PO TABS: 40.0000 mg | ORAL_TABLET | Freq: Every day | ORAL | 4 refills | Status: AC

## 2024-02-14 NOTE — Patient Instructions (Addendum)
 Increase trazadone to 100-150mg  as needed for sleep  You have an order for:  []   2D Mammogram  [x]   3D Mammogram  []   Bone Density     Please call for appointment:  [facility]  [address] [place] [address] [phone]   [facility] [address] [place], [place] [address] [phone]   [facility] [address] [place], [place] [address] [phone]   Make sure to wear two-piece clothing.  No lotions, powders, or deodorants the day of the appointment. Make sure to bring picture ID and insurance card.  Bring list of medications you are currently taking including any supplements.   Schedule your [place] screening mammogram through MyChart!   Log into your MyChart account.  Go to 'Visit' (or 'Appointments' if on mobile App) --> Schedule an Appointment  Under 'Select a Reason for Visit' choose the Mammogram Screening option.  Complete the pre-visit questions and select the time and place that best fits your schedule.

## 2024-02-14 NOTE — Assessment & Plan Note (Signed)
 Difficulty sleeping despite trazodone  100 mg. Self-adjusted to 150 mg with better effect. - Prescribe trazodone  100 mg with instructions to adjust dose as needed. - Advise to contact clinic if higher dose is ineffective.

## 2024-02-14 NOTE — Assessment & Plan Note (Signed)
 Non-compliance with medication regimen potentially affecting cholesterol levels. - Ensure medication refills are up to date.

## 2024-02-14 NOTE — Assessment & Plan Note (Signed)
 Requesting hydroxyzine  refills. Sent as below.

## 2024-02-14 NOTE — Assessment & Plan Note (Signed)
 Requesting tizanidine  refills. Did not request gabapentin  but I do wonder if that may be contributing to fatigue. Encouraged to work on improving sleep and following up w PCP in case dosage adjustments needed.

## 2024-02-14 NOTE — Assessment & Plan Note (Signed)
 Chronic leg swelling likely due to some degree of age related venous stasis. Compression stockings initially painful, suggesting need for larger size. Not swollen today on exam. - Recommend larger size compression stockings or knee-high socks. - Consider ultrasound at follow-up if swelling persists.

## 2024-02-14 NOTE — Progress Notes (Signed)
 Office Visit  BP 108/69   Pulse 71   Temp 97.9 F (36.6 C) (Oral)   Wt 121 lb (54.9 kg)   SpO2 99%   BMI 19.53 kg/m    Subjective:    Patient ID: Caitlyn May, female    DOB: October 30, 1955, 68 y.o.   MRN: [id number]  HPI: Caitlyn May is a 68 y.o. female  Chief Complaint  Patient presents with   Medication Refill    Also patient is needing labs     Discussed the use of AI scribe software for clinical note transcription with the patient, who gave verbal consent to proceed.  History of Present Illness   Caitlyn May is a 68 year old female who presents for lab work and medication refills.  She experiences persistent swelling in her feet, which worsens after being upright for short periods and does not improve with rest or elevation. Medium compression stockings were attempted but were painful and difficult to maintain, prompting her to consider trying a larger size.  For approximately six months, she has been experiencing symptoms suggestive of a vaginal infection, including itching and a noticeable odor. Over-the-counter treatments have been ineffective, and she is uncertain if the condition might be bacterial vaginosis instead.  She reports significant fatigue, with difficulty walking short distances without support. No chest pain or pressure is present. She is concerned about her energy levels and questions if they are age-related.  Her sleep is disrupted despite taking trazodone . She has been taking 100 mg, which sometimes helps, but she has also tried 150 mg, which she finds more effective. She has previously used over-the-counter sleep aids without success.  She has not been consistent with her medications, including those for cholesterol, due to running out of refills.  She is behind on routine screenings, including lung cancer screening, mammogram, and colon cancer screening. She reports having had vehicle trouble and other personal difficulties, and this is  the first time she has fallen behind on her routine care. She typically follows up regularly but has fallen behind for the first time.      Relevant past medical, surgical, family and social history reviewed and updated as indicated. Interim medical history since our last visit reviewed. Allergies and medications reviewed and updated.  ROS per HPI unless specifically indicated above     Objective:    BP 108/69   Pulse 71   Temp 97.9 F (36.6 C) (Oral)   Wt 121 lb (54.9 kg)   SpO2 99%   BMI 19.53 kg/m   Wt Readings from Last 3 Encounters:  02/14/24 121 lb (54.9 kg)  08/15/23 133 lb (60.3 kg)  05/16/23 137 lb 12.8 oz (62.5 kg)     Physical Exam Constitutional:      Appearance: Normal appearance.  Pulmonary:     Effort: Pulmonary effort is normal.  Musculoskeletal:        General: Normal range of motion.  Skin:    Comments: Normal skin color  Neurological:     General: No focal deficit present.     Mental Status: She is alert. Mental status is at baseline.  Psychiatric:        Mood and Affect: Mood normal.        Behavior: Behavior normal.        Thought Content: Thought content normal.         08/15/2023   11:48 AM 04/17/2023    2:48 PM 10/14/2022    1:30 PM 08/09/2022  1:35 PM 04/15/2022    9:53 AM  Depression screen PHQ 2/9  Decreased Interest 0 1 2 1 2   Down, Depressed, Hopeless 2 1 1 1 1   PHQ - 2 Score 2 2 3 2 3   Altered sleeping 0 1 2 1 3   Tired, decreased energy 1 1 1 1 3   Change in appetite 0 0 0  2  Feeling bad or failure about yourself  0 0 1  2  Trouble concentrating 0 0 0  1  Moving slowly or fidgety/restless 0 0 0  1  Suicidal thoughts 0 0 0  0  PHQ-9 Score 3 4 7 4 15   Difficult doing work/chores Somewhat difficult Not difficult at all Somewhat difficult Not difficult at all Somewhat difficult       04/17/2023    2:48 PM 10/14/2022    1:30 PM 04/15/2022    9:54 AM 03/21/2022    3:58 PM  GAD 7 : Generalized Anxiety Score  Nervous,  Anxious, on Edge 1 1 2 1   Control/stop worrying 1 1 2 1   Worry too much - different things 1 1 2 1   Trouble relaxing 0 0 2 0  Restless 0 0 1 0  Easily annoyed or irritable 1 1 1 1   Afraid - awful might happen 0 0 0 1  Total GAD 7 Score 4 4 10 5   Anxiety Difficulty Not difficult at all Not difficult at all Somewhat difficult Somewhat difficult       Assessment & Plan:  Assessment & Plan   Other fatigue Reporting severe fatigue for several months which suspect is likely multifactorial. She did note some degree of medication non compliance but unable to discern which medications. Negative cardiac and respiratory ROS. Plan on starting with blood work as well as focusing on improving insomnia as discussed below. -     TSH -     Vitamin B12; Future -     Iron, TIBC and Ferritin Panel -     Folate -     VITAMIN D  25 Hydroxy (Vit-D Deficiency, Fractures)  Anxiety Assessment & Plan: Requesting hydroxyzine  refills. Sent as below.  Orders: -     hydrOXYzine  Pamoate; Take 1 capsule (25 mg total) by mouth every 8 (eight) hours as needed.  Dispense: 270 capsule; Refill: 1  Mixed hyperlipidemia Assessment & Plan: Non-compliance with medication regimen potentially affecting cholesterol levels. - Ensure medication refills are up to date.  Orders: -     Hemoglobin A1c -     Lipid panel  Primary hypertension Assessment & Plan: Continue olmesartan  40mg , amlodipine  5mg , coreg  12.5mg  BID.   Orders: -     Comprehensive metabolic panel with GFR -     CBC with Differential/Platelet -     amLODIPine  Besylate; Take 1 tablet (5 mg total) by mouth daily.  Dispense: 90 tablet; Refill: 1 -     Carvedilol ; Take 1 tablet (12.5 mg total) by mouth 2 (two) times daily.  Dispense: 180 tablet; Refill: 4 -     Olmesartan  Medoxomil; Take 1 tablet (40 mg total) by mouth daily.  Dispense: 90 tablet; Refill: 4  Primary insomnia Assessment & Plan: Difficulty sleeping despite trazodone  100 mg. Self-adjusted  to 150 mg with better effect. - Prescribe trazodone  100 mg with instructions to adjust dose as needed. - Advise to contact clinic if higher dose is ineffective.  Orders: -     traZODone  HCl; Take 1.5 tablets (150 mg total) by mouth at bedtime  as needed for sleep.  Dispense: 90 tablet; Refill: 1  Chronic pain syndrome Assessment & Plan: Requesting tizanidine  refills. Did not request gabapentin  but I do wonder if that may be contributing to fatigue. Encouraged to work on improving sleep and following up w PCP in case dosage adjustments needed.  Orders: -     tiZANidine  HCl; TAKE 1 TABLET(4 MG) BY MOUTH EVERY 6 HOURS AS NEEDED FOR MUSCLE SPASMS  Dispense: 90 tablet; Refill: 3  Right leg swelling Assessment & Plan: Chronic leg swelling likely due to some degree of age related venous stasis. Compression stockings initially painful, suggesting need for larger size. Not swollen today on exam. - Recommend larger size compression stockings or knee-high socks. - Consider ultrasound at follow-up if swelling persists.   Immunization due -     Flu vaccine HIGH DOSE PF(Fluzone Trivalent)  Encounter for screening for lung cancer -     Ambulatory Referral for Lung Cancer Scre  Vaginal discomfort Persistent vaginal itching and odor unresponsive to OTC treatments. Differential includes yeast infection and bacterial vaginosis. - Order self-swab for vaginal culture. - Treat based on swab results. -     WET PREP FOR TRICH, YEAST, CLUE  Screen for colon cancer -     Cologuard  Encounter for screening mammogram for malignant neoplasm of breast -     3D Screening Mammogram, Left and Right; Future   Follow up plan: Return in about 6 weeks (around 03/27/2024) for Chronic illness f/u.  Hadassah SHAUNNA Nett, MD

## 2024-02-14 NOTE — Assessment & Plan Note (Signed)
 Continue olmesartan  40mg , amlodipine  5mg , coreg  12.5mg  BID.

## 2024-02-15 LAB — CBC WITH DIFFERENTIAL/PLATELET
Basophils Absolute: 0.1 x10E3/uL (ref 0.0–0.2)
Basos: 1 %
EOS (ABSOLUTE): 0.1 x10E3/uL (ref 0.0–0.4)
Eos: 1 %
Hematocrit: 47.9 % — ABNORMAL HIGH (ref 34.0–46.6)
Hemoglobin: 15.1 g/dL (ref 11.1–15.9)
Immature Grans (Abs): 0 x10E3/uL (ref 0.0–0.1)
Immature Granulocytes: 0 %
Lymphocytes Absolute: 2.1 x10E3/uL (ref 0.7–3.1)
Lymphs: 22 %
MCH: 29.8 pg (ref 26.6–33.0)
MCHC: 31.5 g/dL (ref 31.5–35.7)
MCV: 95 fL (ref 79–97)
Monocytes Absolute: 0.9 x10E3/uL (ref 0.1–0.9)
Monocytes: 9 %
Neutrophils Absolute: 6.6 x10E3/uL (ref 1.4–7.0)
Neutrophils: 67 %
Platelets: 283 x10E3/uL (ref 150–450)
RBC: 5.07 x10E6/uL (ref 3.77–5.28)
RDW: 12.6 % (ref 11.7–15.4)
WBC: 9.9 x10E3/uL (ref 3.4–10.8)

## 2024-02-15 LAB — TSH: TSH: 5.21 u[IU]/mL — ABNORMAL HIGH (ref 0.450–4.500)

## 2024-02-15 LAB — COMPREHENSIVE METABOLIC PANEL WITH GFR
ALT: 7 IU/L (ref 0–32)
AST: 10 IU/L (ref 0–40)
Albumin: 4.3 g/dL (ref 3.9–4.9)
Alkaline Phosphatase: 98 IU/L (ref 49–135)
BUN/Creatinine Ratio: 10 — ABNORMAL LOW (ref 12–28)
BUN: 13 mg/dL (ref 8–27)
Bilirubin Total: 0.3 mg/dL (ref 0.0–1.2)
CO2: 21 mmol/L (ref 20–29)
Calcium: 9.1 mg/dL (ref 8.7–10.3)
Chloride: 104 mmol/L (ref 96–106)
Creatinine, Ser: 1.3 mg/dL — ABNORMAL HIGH (ref 0.57–1.00)
Globulin, Total: 2.7 g/dL (ref 1.5–4.5)
Glucose: 90 mg/dL (ref 70–99)
Potassium: 4.2 mmol/L (ref 3.5–5.2)
Sodium: 142 mmol/L (ref 134–144)
Total Protein: 7 g/dL (ref 6.0–8.5)
eGFR: 45 mL/min/1.73 — ABNORMAL LOW (ref >59)

## 2024-02-15 LAB — IRON,TIBC AND FERRITIN PANEL
Ferritin: 60 ng/mL (ref 15–150)
Iron Saturation: 21 % (ref 15–55)
Iron: 59 ug/dL (ref 27–139)
Total Iron Binding Capacity: 287 ug/dL (ref 250–450)
UIBC: 228 ug/dL (ref 118–369)

## 2024-02-15 LAB — LIPID PANEL
Chol/HDL Ratio: 5.7 ratio — ABNORMAL HIGH (ref 0.0–4.4)
Cholesterol, Total: 243 mg/dL — ABNORMAL HIGH (ref 100–199)
HDL: 43 mg/dL (ref >39)
LDL Chol Calc (NIH): 176 mg/dL — ABNORMAL HIGH (ref 0–99)
Triglycerides: 134 mg/dL (ref 0–149)
VLDL Cholesterol Cal: 24 mg/dL (ref 5–40)

## 2024-02-15 LAB — HEMOGLOBIN A1C
Est. average glucose Bld gHb Est-mCnc: 103 mg/dL
Hgb A1c MFr Bld: 5.2 % (ref 4.8–5.6)

## 2024-02-15 LAB — VITAMIN D 25 HYDROXY (VIT D DEFICIENCY, FRACTURES): Vit D, 25-Hydroxy: 52 ng/mL (ref 30.0–100.0)

## 2024-02-15 LAB — FOLATE: Folate: 12.2 ng/mL (ref >3.0)

## 2024-02-16 ENCOUNTER — Telehealth: Payer: Self-pay

## 2024-02-16 NOTE — Telephone Encounter (Signed)
 Most recent labs done by Dr. Herold. Routing to her for review.

## 2024-02-16 NOTE — Telephone Encounter (Signed)
 Copied from CRM #[id number]. Topic: Clinical - Lab/Test Results >> Feb 16, 2024 10:06 AM Delon DASEN wrote: Reason for CRM: calling for results of labs, no notes from Jolene.

## 2024-02-16 NOTE — Telephone Encounter (Signed)
 See result note.

## 2024-02-19 ENCOUNTER — Telehealth: Payer: Self-pay

## 2024-02-19 NOTE — Telephone Encounter (Unsigned)
 Copied from CRM #[id number]. Topic: Clinical - Lab/Test Results >> Feb 19, 2024 11:45 AM Carlatta H wrote: Reason for CRM: Please call the patient with lab results

## 2024-02-20 ENCOUNTER — Other Ambulatory Visit: Payer: Self-pay | Admitting: Pediatrics

## 2024-02-20 DIAGNOSIS — E782 Mixed hyperlipidemia: Secondary | ICD-10-CM

## 2024-02-20 LAB — VITAMIN B12: Vitamin B-12: 152 pg/mL — ABNORMAL LOW (ref 232–1245)

## 2024-02-20 LAB — SPECIMEN STATUS REPORT

## 2024-02-22 ENCOUNTER — Telehealth: Payer: Self-pay

## 2024-02-22 NOTE — Telephone Encounter (Unsigned)
 Copied from CRM [id number]. Topic: General - Other >> Feb 22, 2024  2:47 PM Turkey B wrote: Reason for CRM: Patient called in I gave lab results and patient states she would rather do oral pills for her b12 deficiency

## 2024-02-22 NOTE — Telephone Encounter (Signed)
 Please see results management.

## 2024-02-23 NOTE — Telephone Encounter (Signed)
 Called and LVM notifying patient of Dr. Lenard message. Asked for patient to call back with any questions or concerns.

## 2024-03-04 NOTE — Progress Notes (Signed)
 [place]                                          MRN: [id number]   03/04/2024   The [facility] Quality Team Specialist reviewed this patient medical record for the purposes of chart review for care gap closure. The following were reviewed: abstraction for care gap closure-controlling blood pressure.    [facility] Quality Team

## 2024-03-24 DIAGNOSIS — E538 Deficiency of other specified B group vitamins: Secondary | ICD-10-CM | POA: Insufficient documentation

## 2024-03-24 NOTE — Patient Instructions (Incomplete)
 Vitamin B12 Deficiency Vitamin B12 deficiency occurs when the body does not have enough of this important vitamin. The body needs this vitamin: To make red blood cells. To make DNA. This is the genetic material inside cells. To help the nerves work properly so they can carry messages from the brain to the body. Vitamin B12 deficiency can cause health problems, such as not having enough red blood cells in the blood (anemia). This can lead to nerve damage if untreated. What are the causes? This condition may be caused by: Not eating enough foods that contain vitamin B12. Not having enough stomach acid and digestive fluids to properly absorb vitamin B12 from the food that you eat. Having certain diseases that make it hard to absorb vitamin B12. These diseases include Crohn's disease, chronic pancreatitis, and cystic fibrosis. An autoimmune disorder in which the body does not make enough of a protein (intrinsic factor) within the stomach, resulting in not enough absorption of vitamin B12. Having a surgery in which part of the stomach or small intestine is removed. Taking certain medicines that make it hard for the body to absorb vitamin B12. These include: Heartburn medicines, such as antacids and proton pump inhibitors. Some medicines that are used to treat diabetes. What increases the risk? The following factors may make you more likely to develop a vitamin B12 deficiency: Being an older adult. Eating a vegetarian or vegan diet that does not include any foods that come from animals. Eating a poor diet while you are pregnant. Taking certain medicines. Having alcoholism. What are the signs or symptoms? In some cases, there are no symptoms of this condition. If the condition leads to anemia or nerve damage, various symptoms may occur, such as: Weakness. Tiredness (fatigue). Loss of appetite. Numbness or tingling in your hands and feet. Redness and burning of the tongue. Depression,  confusion, or memory problems. Trouble walking. If anemia is severe, symptoms can include: Shortness of breath. Dizziness. Rapid heart rate. How is this diagnosed? This condition may be diagnosed with a blood test to measure the level of vitamin B12 in your blood. You may also have other tests, including: A group of tests that measure certain characteristics of blood cells (complete blood count, CBC). A blood test to measure intrinsic factor. A procedure where a thin tube with a camera on the end is used to look into your stomach or intestines (endoscopy). Other tests may be needed to discover the cause of the deficiency. How is this treated? Treatment for this condition depends on the cause. This condition may be treated by: Changing your eating and drinking habits, such as: Eating more foods that contain vitamin B12. Drinking less alcohol or no alcohol. Getting vitamin B12 injections. Taking vitamin B12 supplements by mouth (orally). Your health care provider will tell you which dose is best for you. Follow these instructions at home: Eating and drinking  Include foods in your diet that come from animals and contain a lot of vitamin B12. These include: Meats and poultry. This includes beef, pork, chicken, [place], and organ meats, such as liver. Seafood. This includes clams, rainbow trout, salmon, tuna, and haddock. Eggs. Dairy foods such as milk, yogurt, and cheese. Eat foods that have vitamin B12 added to them (are fortified), such as ready-to-eat breakfast cereals. Check the label on the package to see if a food is fortified. The items listed above may not be a complete list of foods and beverages you can eat and drink. Contact a dietitian for  more information. Alcohol use Do not drink alcohol if: Your health care provider tells you not to drink. You are pregnant, may be pregnant, or are planning to become pregnant. If you drink alcohol: Limit how much you have to: 0-1 drink a  day for women. 0-2 drinks a day for men. Know how much alcohol is in your drink. In the [place] one drink equals one 12 oz bottle of beer (355 mL), one 5 oz glass of wine (148 mL), or one 1 oz glass of hard liquor (44 mL). General instructions Get vitamin B12 injections if told to by your health care provider. Take supplements only as told by your health care provider. Follow the directions carefully. Keep all follow-up visits. This is important. Contact a health care provider if: Your symptoms come back. Your symptoms get worse or do not improve with treatment. Get help right away: You develop shortness of breath. You have a rapid heart rate. You have chest pain. You become dizzy or you faint. These symptoms may be an emergency. Get help right away. Call 911. Do not wait to see if the symptoms will go away. Do not drive yourself to the hospital. Summary Vitamin B12 deficiency occurs when the body does not have enough of this important vitamin. Common causes include not eating enough foods that contain vitamin B12, not being able to absorb vitamin B12 from the food that you eat, having a surgery in which part of the stomach or small intestine is removed, or taking certain medicines. Eat foods that have vitamin B12 in them. Treatment may include making a change in the way you eat and drink, getting vitamin B12 injections, or taking vitamin B12 supplements. This information is not intended to replace advice given to you by your health care provider. Make sure you discuss any questions you have with your health care provider. Document Revised: 01/08/2021 Document Reviewed: 01/08/2021 Elsevier Patient Education  2024 [employer].

## 2024-03-28 ENCOUNTER — Ambulatory Visit: Admitting: Nurse Practitioner

## 2024-03-28 DIAGNOSIS — E538 Deficiency of other specified B group vitamins: Secondary | ICD-10-CM

## 2024-03-28 DIAGNOSIS — R7989 Other specified abnormal findings of blood chemistry: Secondary | ICD-10-CM

## 2024-03-28 DIAGNOSIS — E782 Mixed hyperlipidemia: Secondary | ICD-10-CM

## 2024-03-28 DIAGNOSIS — I10 Essential (primary) hypertension: Secondary | ICD-10-CM

## 2024-04-12 LAB — COLOGUARD

## 2024-04-13 NOTE — Patient Instructions (Incomplete)

## 2024-04-15 ENCOUNTER — Telehealth: Payer: Self-pay | Admitting: Nurse Practitioner

## 2024-04-15 ENCOUNTER — Ambulatory Visit: Admitting: Nurse Practitioner

## 2024-04-15 DIAGNOSIS — F419 Anxiety disorder, unspecified: Secondary | ICD-10-CM

## 2024-04-15 DIAGNOSIS — I77810 Thoracic aortic ectasia: Secondary | ICD-10-CM

## 2024-04-15 DIAGNOSIS — I10 Essential (primary) hypertension: Secondary | ICD-10-CM

## 2024-04-15 DIAGNOSIS — F5101 Primary insomnia: Secondary | ICD-10-CM

## 2024-04-15 DIAGNOSIS — F339 Major depressive disorder, recurrent, unspecified: Secondary | ICD-10-CM

## 2024-04-15 DIAGNOSIS — I739 Peripheral vascular disease, unspecified: Secondary | ICD-10-CM

## 2024-04-15 DIAGNOSIS — E782 Mixed hyperlipidemia: Secondary | ICD-10-CM

## 2024-04-15 DIAGNOSIS — J432 Centrilobular emphysema: Secondary | ICD-10-CM

## 2024-04-15 DIAGNOSIS — E538 Deficiency of other specified B group vitamins: Secondary | ICD-10-CM

## 2024-04-15 DIAGNOSIS — R7989 Other specified abnormal findings of blood chemistry: Secondary | ICD-10-CM

## 2024-04-15 NOTE — Telephone Encounter (Signed)
 Copied from CRM #[id number]. Topic: Appointments - Scheduling Inquiry for Clinic >> Apr 15, 2024 10:37 AM Ashley SAUNDERS wrote: Reason for CRM: callback [phone] - needed to reschedule appt today - next soonest was Dec 23. Requesting if she can come in for lab work separately before Dec as the second test is required for getting a prescription. Also requesting seeing another provider (santos) if possible to review labs. Added to waitlist in the meantime   ----------------------------------------------------------------------- From previous Reason for Contact - Lab/Test Order Request: Reason for CRM:

## 2024-05-07 ENCOUNTER — Other Ambulatory Visit: Payer: Self-pay | Admitting: Nurse Practitioner

## 2024-05-09 NOTE — Telephone Encounter (Signed)
 Requested Prescriptions  Pending Prescriptions Disp Refills   megestrol  (MEGACE ) 40 MG tablet [Pharmacy Med Name: MEGESTROL  ACETATE 40 MG TAB] 120 tablet 4    Sig: TAKE 1 TABLET BY MOUTH 4 TIMES DAILY AS NEEDED     Not Delegated - OB/GYN:  Progestins - megestrol  acetate Failed - 05/09/2024  1:22 PM      Failed - This refill cannot be delegated      Passed - Last BP in normal range    BP Readings from Last 1 Encounters:  02/14/24 108/69         Passed - Valid encounter within last 12 months    Recent Outpatient Visits           2 months ago Other fatigue   [place] [facility] Herold Hadassah SQUIBB, MD   7 months ago Primary insomnia   [facility] [facility], [place] T, NP               gabapentin  (NEURONTIN ) 600 MG tablet [Pharmacy Med Name: GABAPENTIN  600 MG TAB] 270 tablet 0    Sig: TAKE 1 TABLET BY MOUTH 3 TIMES DAILY     Neurology: Anticonvulsants - gabapentin  Failed - 05/09/2024  1:22 PM      Failed - Cr in normal range and within 360 days    Creatinine, Ser  Date Value Ref Range Status  02/14/2024 1.30 (H) 0.57 - 1.00 mg/dL Final         Passed - Completed PHQ-2 or PHQ-9 in the last 360 days      Passed - Valid encounter within last 12 months    Recent Outpatient Visits           2 months ago Other fatigue   [place] [facility] Herold Hadassah SQUIBB, MD   7 months ago Primary insomnia    [facility] [place], [place] T, NP               buPROPion  (WELLBUTRIN  XL) 150 MG 24 hr tablet [Pharmacy Med Name: BUPROPION  HCL ER (XL) 150 MG TAB] 90 tablet 0    Sig: TAKE 1 TABLET BY MOUTH DAILY     Psychiatry: Antidepressants - bupropion  Failed - 05/09/2024  1:22 PM      Failed - Cr in normal range and within 360 days    Creatinine, Ser  Date Value Ref Range Status  02/14/2024 1.30 (H) 0.57 - 1.00 mg/dL Final         Passed - AST in normal range and within 360 days    AST  Date Value Ref  Range Status  02/14/2024 10 0 - 40 IU/L Final   AST (SGOT) Piccolo, Waived  Date Value Ref Range Status  12/11/2017 27 11 - 38 U/L Final         Passed - ALT in normal range and within 360 days    ALT  Date Value Ref Range Status  02/14/2024 7 0 - 32 IU/L Final   ALT (SGPT) Piccolo, Waived  Date Value Ref Range Status  12/11/2017 32 10 - 47 U/L Final         Passed - Completed PHQ-2 or PHQ-9 in the last 360 days      Passed - Last BP in normal range    BP Readings from Last 1 Encounters:  02/14/24 108/69         Passed - Valid encounter within last 6 months  Recent Outpatient Visits           2 months ago Other fatigue   [place] [facility] Herold Hadassah SQUIBB, MD   7 months ago Primary insomnia   [place] [facility] [place], Jolene T, NP               pantoprazole  (PROTONIX ) 40 MG tablet [Pharmacy Med Name: PANTOPRAZOLE  SODIUM 40 MG DR TAB] 90 tablet 0    Sig: TAKE 1 TABLET BY MOUTH AT BEDTIME     Gastroenterology: Proton Pump Inhibitors Passed - 05/09/2024  1:22 PM      Passed - Valid encounter within last 12 months    Recent Outpatient Visits           2 months ago Other fatigue   [place] [facility] Herold Hadassah SQUIBB, MD   7 months ago Primary insomnia   [place] [facility] [place], Melanie DASEN, NP

## 2024-05-09 NOTE — Telephone Encounter (Signed)
 Requested medication (s) are due for refill today: yes  Requested medication (s) are on the active medication list: yes  Last refill:  01/26/24  Future visit scheduled: yes  Notes to clinic:  Unable to refill per protocol, cannot delegate.      Requested Prescriptions  Pending Prescriptions Disp Refills   megestrol  (MEGACE ) 40 MG tablet [Pharmacy Med Name: MEGESTROL  ACETATE 40 MG TAB] 120 tablet 4    Sig: TAKE 1 TABLET BY MOUTH 4 TIMES DAILY AS NEEDED     Not Delegated - OB/GYN:  Progestins - megestrol  acetate Failed - 05/09/2024  1:24 PM      Failed - This refill cannot be delegated      Passed - Last BP in normal range    BP Readings from Last 1 Encounters:  02/14/24 108/69         Passed - Valid encounter within last 12 months    Recent Outpatient Visits           2 months ago Other fatigue   [place] [facility] Herold Hadassah SQUIBB, MD   7 months ago Primary insomnia   [place] [facility] [place], [place] T, NP              Signed Prescriptions Disp Refills   gabapentin  (NEURONTIN ) 600 MG tablet 270 tablet 0    Sig: TAKE 1 TABLET BY MOUTH 3 TIMES DAILY     Neurology: Anticonvulsants - gabapentin  Failed - 05/09/2024  1:24 PM      Failed - Cr in normal range and within 360 days    Creatinine, Ser  Date Value Ref Range Status  02/14/2024 1.30 (H) 0.57 - 1.00 mg/dL Final         Passed - Completed PHQ-2 or PHQ-9 in the last 360 days      Passed - Valid encounter within last 12 months    Recent Outpatient Visits           2 months ago Other fatigue   [place] [facility] Herold Hadassah SQUIBB, MD   7 months ago Primary insomnia   [place] [facility] [place], [place] T, NP               buPROPion  (WELLBUTRIN  XL) 150 MG 24 hr tablet 90 tablet 0    Sig: TAKE 1 TABLET BY MOUTH DAILY     Psychiatry: Antidepressants - bupropion  Failed - 05/09/2024  1:24 PM      Failed - Cr in normal range and  within 360 days    Creatinine, Ser  Date Value Ref Range Status  02/14/2024 1.30 (H) 0.57 - 1.00 mg/dL Final         Passed - AST in normal range and within 360 days    AST  Date Value Ref Range Status  02/14/2024 10 0 - 40 IU/L Final   AST (SGOT) Piccolo, Waived  Date Value Ref Range Status  12/11/2017 27 11 - 38 U/L Final         Passed - ALT in normal range and within 360 days    ALT  Date Value Ref Range Status  02/14/2024 7 0 - 32 IU/L Final   ALT (SGPT) Piccolo, Waived  Date Value Ref Range Status  12/11/2017 32 10 - 47 U/L Final         Passed - Completed PHQ-2 or PHQ-9 in the last 360 days      Passed -  Last BP in normal range    BP Readings from Last 1 Encounters:  02/14/24 108/69         Passed - Valid encounter within last 6 months    Recent Outpatient Visits           2 months ago Other fatigue   [place] [facility] Herold Hadassah SQUIBB, MD   7 months ago Primary insomnia   [place] [facility] [place], Jolene T, NP               pantoprazole  (PROTONIX ) 40 MG tablet 90 tablet 0    Sig: TAKE 1 TABLET BY MOUTH AT BEDTIME     Gastroenterology: Proton Pump Inhibitors Passed - 05/09/2024  1:24 PM      Passed - Valid encounter within last 12 months    Recent Outpatient Visits           2 months ago Other fatigue   [place] [facility] Herold Hadassah SQUIBB, MD   7 months ago Primary insomnia   [place] [facility] [place], Melanie DASEN, NP

## 2024-05-10 ENCOUNTER — Other Ambulatory Visit: Payer: Self-pay | Admitting: Nurse Practitioner

## 2024-05-14 NOTE — Telephone Encounter (Signed)
 Requested Prescriptions  Pending Prescriptions Disp Refills   albuterol  (PROVENTIL ) (2.5 MG/3ML) 0.083% nebulizer solution [Pharmacy Med Name: ALBUTEROL  SULFATE (2.5 MG/3ML) 0.08] 150 mL 0    Sig: USE 1 VIAL BY NEBULIZATION EVERY 6 HOURSAS NEEDED FOR WHEEZING OR SHORTNESS OF BREATH     Pulmonology:  Beta Agonists 2 Passed - 05/14/2024  9:00 AM      Passed - Last BP in normal range    BP Readings from Last 1 Encounters:  02/14/24 108/69         Passed - Last Heart Rate in normal range    Pulse Readings from Last 1 Encounters:  02/14/24 71         Passed - Valid encounter within last 12 months    Recent Outpatient Visits           3 months ago Other fatigue   [name] [facility] Herold Hadassah SQUIBB, MD   8 months ago Primary insomnia   [name] [facility] [name], Melanie DASEN, NP

## 2024-05-19 NOTE — Patient Instructions (Signed)

## 2024-05-21 ENCOUNTER — Ambulatory Visit (INDEPENDENT_AMBULATORY_CARE_PROVIDER_SITE_OTHER): Admitting: Nurse Practitioner

## 2024-05-21 ENCOUNTER — Encounter: Payer: Self-pay | Admitting: Nurse Practitioner

## 2024-05-21 VITALS — BP 123/77 | HR 68 | Temp 98.5°F | Resp 16 | Ht 65.98 in | Wt 120.8 lb

## 2024-05-21 DIAGNOSIS — N3001 Acute cystitis with hematuria: Secondary | ICD-10-CM

## 2024-05-21 DIAGNOSIS — J432 Centrilobular emphysema: Secondary | ICD-10-CM

## 2024-05-21 DIAGNOSIS — R634 Abnormal weight loss: Secondary | ICD-10-CM

## 2024-05-21 DIAGNOSIS — R7989 Other specified abnormal findings of blood chemistry: Secondary | ICD-10-CM

## 2024-05-21 DIAGNOSIS — Z1211 Encounter for screening for malignant neoplasm of colon: Secondary | ICD-10-CM

## 2024-05-21 DIAGNOSIS — E782 Mixed hyperlipidemia: Secondary | ICD-10-CM

## 2024-05-21 DIAGNOSIS — I10 Essential (primary) hypertension: Secondary | ICD-10-CM

## 2024-05-21 DIAGNOSIS — E538 Deficiency of other specified B group vitamins: Secondary | ICD-10-CM

## 2024-05-21 DIAGNOSIS — F419 Anxiety disorder, unspecified: Secondary | ICD-10-CM

## 2024-05-21 DIAGNOSIS — Z5982 Transportation insecurity: Secondary | ICD-10-CM | POA: Diagnosis not present

## 2024-05-21 DIAGNOSIS — F339 Major depressive disorder, recurrent, unspecified: Secondary | ICD-10-CM | POA: Diagnosis not present

## 2024-05-21 DIAGNOSIS — N3000 Acute cystitis without hematuria: Secondary | ICD-10-CM | POA: Insufficient documentation

## 2024-05-21 LAB — URINALYSIS, ROUTINE W REFLEX MICROSCOPIC
Glucose, UA: NEGATIVE
Leukocytes,UA: NEGATIVE
Nitrite, UA: NEGATIVE
Specific Gravity, UA: 1.03 (ref 1.005–1.030)
Urobilinogen, Ur: 0.2 mg/dL (ref 0.2–1.0)
pH, UA: 5.5 (ref 5.0–7.5)

## 2024-05-21 LAB — MICROSCOPIC EXAMINATION: Bacteria, UA: NONE SEEN

## 2024-05-21 MED ORDER — ALBUTEROL SULFATE (2.5 MG/3ML) 0.083% IN NEBU: 2.5000 mg | INHALATION_SOLUTION | RESPIRATORY_TRACT | 3 refills | Status: AC | PRN

## 2024-05-21 MED ORDER — GABAPENTIN 600 MG PO TABS: 600.0000 mg | ORAL_TABLET | Freq: Three times a day (TID) | ORAL | 1 refills | Status: AC

## 2024-05-21 MED ORDER — MEGESTROL ACETATE 40 MG PO TABS: 40.0000 mg | ORAL_TABLET | Freq: Four times a day (QID) | ORAL | 4 refills | Status: AC

## 2024-05-21 MED ORDER — VITAMIN B-12 1000 MCG PO TABS: 1000.0000 ug | ORAL_TABLET | Freq: Every day | ORAL | 4 refills | Status: AC

## 2024-05-21 MED ORDER — NITROFURANTOIN MONOHYD MACRO 100 MG PO CAPS: 100.0000 mg | ORAL_CAPSULE | Freq: Two times a day (BID) | ORAL | 0 refills | Status: AC

## 2024-05-21 MED ORDER — BUPROPION HCL ER (XL) 150 MG PO TB24: 150.0000 mg | ORAL_TABLET | Freq: Every day | ORAL | 1 refills | Status: AC

## 2024-05-21 MED ORDER — ALBUTEROL SULFATE HFA 108 (90 BASE) MCG/ACT IN AERS: 2.0000 | INHALATION_SPRAY | Freq: Four times a day (QID) | RESPIRATORY_TRACT | 0 refills | Status: AC | PRN

## 2024-05-21 MED ORDER — TRELEGY ELLIPTA 100-62.5-25 MCG/ACT IN AEPB: 1.0000 | INHALATION_SPRAY | Freq: Every day | RESPIRATORY_TRACT | 11 refills | Status: AC

## 2024-05-21 MED ORDER — ROSUVASTATIN CALCIUM 40 MG PO TABS: 40.0000 mg | ORAL_TABLET | Freq: Every day | ORAL | 4 refills | Status: AC

## 2024-05-21 NOTE — Assessment & Plan Note (Signed)
 Acute for 4 to 5 weeks with multiple positive findings on UA.  Will send for culture to further assess. She has multiple abx allergies, so will start Macrobid  100 MG BID for 5 days. Change regimen if needed based on culture results. Discussed plan with her today. Recommend increase water intake at home and take cranberry supplement.

## 2024-05-21 NOTE — Assessment & Plan Note (Signed)
 Continues on Megace , but does not take consistently. Recommend she take this as ordered. Check labs today. Would benefit CT lung to further assess, which discussed with her today. She is agreeable to this. Order placed.

## 2024-05-21 NOTE — Assessment & Plan Note (Signed)
 Noted on recent labs, she has not started supplement, will send this in. She would rather take oral supplement as would be difficult to get to office every month for injections. Script sent.

## 2024-05-21 NOTE — Assessment & Plan Note (Addendum)
 Chronic, stable. Continue Trelegy and Albuterol , Trelegy offering benefit per her report. Pulmonary referral in past, but did not attend. ?need for O2 with activity in future. Order for Chest CT placed due to her current weight loss, has missed lung screening visits.  Will work on assisting with transportation via referral to [facility].  Recommend complete cessation of smoking.

## 2024-05-21 NOTE — Assessment & Plan Note (Signed)
 Chronic, stable. Denies SI/HI.  Continue Wellbutrin and Zoloft which are offering her benefit.  Refills as needed.

## 2024-05-21 NOTE — Assessment & Plan Note (Signed)
 Recheck labs today, is having some symptoms.  Start medication as needed.

## 2024-05-21 NOTE — Progress Notes (Signed)
 "  BP 123/77 (BP Location: Left Arm, Patient Position: Sitting, Cuff Size: Normal)   Pulse 68   Temp 98.5 F (36.9 C) (Oral)   Resp 16   Ht 5' 5.98 (1.676 m)   Wt 120 lb 12.8 oz (54.8 kg)   SpO2 94%   BMI 19.51 kg/m    Subjective:    Patient ID: Caitlyn May, female    DOB: 09-Dec-1955, 68 y.o.   MRN: [id number]  HPI: Caitlyn May is a 68 y.o. female  Chief Complaint  Patient presents with   Blood Work    Here to get blood work that was not done before, needs blood work to be done before going on medication for thyroid . She said that she was told a long time ago   Urinary Frequency    Things she may have a bladder or kidney infection and has been going to the bathroom a lot more lately    ELEVATED TSH  Elevation in TSH on past labs, this fluctuates. She has issues with weight gain and continues Megace , but is not taking consistently -- started by previous provider in 2021. On review has lost 13 pounds since March. Has low B12 level on labs Fatigue: yes Cold intolerance: no Heat intolerance: no Weight gain: no Weight loss: yes Constipation: no Diarrhea/loose stools: yes Palpitations: no Lower extremity edema: no Anxiety/depressed mood: occasional   COPD Continues to smoke 1 PPD, has smoked since age 73 or 34.  Uses Trelegy and Albuterol . Is having difficulty getting to visits due to lack of transportation.  Has not seen pulmonary since 03/01/22. COPD status: stable Satisfied with current treatment?: yes Oxygen use: no Dyspnea frequency: occasional Cough frequency: occasional Rescue inhaler frequency:  occasional Limitation of activity: no Productive cough: none Last Spirometry: 2023 Pneumovax: Up to Date Influenza: Up to Date   URINARY SYMPTOMS These symptoms started 4-5 weeks ago. Dysuria: burning Urinary frequency: yes Urgency: yes Small volume voids: yes Symptom severity: no Urinary incontinence: yes Foul odor: yes Hematuria: no Abdominal pain:  yes Back pain: no Suprapubic pain/pressure: yes Flank pain: no Fever:  no Vomiting: no Status: stable Previous urinary tract infection: yes Recurrent urinary tract infection: no Sexual activity: No sexually active History of sexually transmitted disease: no Treatments attempted: increasing fluids       05/21/2024    2:57 PM 08/15/2023   11:48 AM 04/17/2023    2:48 PM 10/14/2022    1:30 PM 08/09/2022    1:35 PM  Depression screen PHQ 2/9  Decreased Interest 0 0 1 2 1   Down, Depressed, Hopeless 0 2 1 1 1   PHQ - 2 Score 0 2 2 3 2   Altered sleeping 0 0 1 2 1   Tired, decreased energy 0 1 1 1 1   Change in appetite 0 0 0 0   Feeling bad or failure about yourself  0 0 0 1   Trouble concentrating 0 0 0 0   Moving slowly or fidgety/restless 0 0 0 0   Suicidal thoughts 0 0 0 0   PHQ-9 Score 0 3  4  7  4    Difficult doing work/chores  Somewhat difficult Not difficult at all Somewhat difficult Not difficult at all     Data saved with a previous flowsheet row definition       05/21/2024    2:57 PM 04/17/2023    2:48 PM 10/14/2022    1:30 PM 04/15/2022    9:54 AM  GAD 7 : Generalized  Anxiety Score  Nervous, Anxious, on Edge 0 1 1 2   Control/stop worrying 0 1 1 2   Worry too much - different things 0 1 1 2   Trouble relaxing 0 0 0 2  Restless 0 0 0 1  Easily annoyed or irritable 0 1 1 1   Afraid - awful might happen 0 0 0 0  Total GAD 7 Score 0 4 4 10   Anxiety Difficulty  Not difficult at all Not difficult at all Somewhat difficult   Relevant past medical, surgical, family and social history reviewed and updated as indicated. Interim medical history since our last visit reviewed. Allergies and medications reviewed and updated.  Review of Systems  Constitutional:  Positive for appetite change, fatigue and unexpected weight change. Negative for activity change, diaphoresis and fever.  Respiratory:  Positive for cough (at times) and shortness of breath (at times with smoking).  Negative for chest tightness and wheezing.   Cardiovascular:  Negative for chest pain, palpitations and leg swelling.  Gastrointestinal: Negative.   Neurological: Negative.   Psychiatric/Behavioral: Negative.     Per HPI unless specifically indicated above     Objective:    BP 123/77 (BP Location: Left Arm, Patient Position: Sitting, Cuff Size: Normal)   Pulse 68   Temp 98.5 F (36.9 C) (Oral)   Resp 16   Ht 5' 5.98 (1.676 m)   Wt 120 lb 12.8 oz (54.8 kg)   SpO2 94%   BMI 19.51 kg/m   Wt Readings from Last 3 Encounters:  05/21/24 120 lb 12.8 oz (54.8 kg)  02/14/24 121 lb (54.9 kg)  08/15/23 133 lb (60.3 kg)    Physical Exam Vitals and nursing note reviewed.  Constitutional:      General: She is awake. She is not in acute distress.    Appearance: She is well-developed, well-groomed and underweight. She is not ill-appearing or toxic-appearing.  HENT:     Head: Normocephalic.     Right Ear: Hearing and external ear normal.     Left Ear: Hearing and external ear normal.  Eyes:     General: Lids are normal.        Right eye: No discharge.        Left eye: No discharge.     Conjunctiva/sclera: Conjunctivae normal.     Pupils: Pupils are equal, round, and reactive to light.  Neck:     Thyroid : No thyromegaly.     Vascular: No carotid bruit.  Cardiovascular:     Rate and Rhythm: Normal rate and regular rhythm.     Heart sounds: Normal heart sounds. No murmur heard.    No gallop.  Pulmonary:     Effort: Pulmonary effort is normal. No accessory muscle usage or respiratory distress.     Breath sounds: Normal breath sounds. No decreased breath sounds, wheezing or rales.  Abdominal:     General: Bowel sounds are normal. There is no distension.     Palpations: Abdomen is soft.     Tenderness: There is no abdominal tenderness. There is no right CVA tenderness or left CVA tenderness.  Musculoskeletal:     Cervical back: Normal range of motion and neck supple.     Right lower  leg: No edema.     Left lower leg: No edema.  Lymphadenopathy:     Cervical: No cervical adenopathy.  Skin:    General: Skin is warm and dry.  Neurological:     Mental Status: She is alert and oriented to  person, place, and time.     Deep Tendon Reflexes: Reflexes are normal and symmetric.     Reflex Scores:      Brachioradialis reflexes are 2+ on the right side and 2+ on the left side.      Patellar reflexes are 2+ on the right side and 2+ on the left side. Psychiatric:        Attention and Perception: Attention normal.        Mood and Affect: Mood normal.        Speech: Speech normal.        Behavior: Behavior normal. Behavior is cooperative.        Thought Content: Thought content normal.    Results for orders placed or performed in visit on 05/21/24  Microscopic Examination   Collection Time: 05/21/24  3:05 PM   Urine  Result Value Ref Range   WBC, UA 0-5 0 - 5 /hpf   RBC, Urine 0-2 0 - 2 /hpf   Epithelial Cells (non renal) 0-10 0 - 10 /hpf   Casts Present (A) None seen /lpf   Cast Type Hyaline casts N/A   Mucus, UA Present (A) Not Estab.   Bacteria, UA None seen None seen/Few  Urinalysis, Routine w reflex microscopic   Collection Time: 05/21/24  3:05 PM  Result Value Ref Range   Specific Gravity, UA 1.030 1.005 - 1.030   pH, UA 5.5 5.0 - 7.5   Color, UA Yellow Yellow   Appearance Ur Slightly cloudy Clear   Leukocytes,UA Negative Negative   Protein,UA 2+ (A) Negative/Trace   Glucose, UA Negative Negative   Ketones, UA Trace (A) Negative   RBC, UA Trace (A) Negative   Bilirubin, UA Comment (A) Negative   Urobilinogen, Ur 0.2 0.2 - 1.0 mg/dL   Nitrite, UA Negative Negative   Microscopic Examination See below:       Assessment & Plan:   Problem List Items Addressed This Visit       Respiratory   Centrilobular emphysema (HCC) - Primary   Chronic, stable. Continue Trelegy and Albuterol , Trelegy offering benefit per her report. Pulmonary referral in past, but  did not attend. ?need for O2 with activity in future. Order for Chest CT placed due to her current weight loss, has missed lung screening visits.  Will work on assisting with transportation via referral to [facility].  Recommend complete cessation of smoking.        Relevant Medications   albuterol  (PROVENTIL ) (2.5 MG/3ML) 0.083% nebulizer solution   Fluticasone -Umeclidin-Vilant (TRELEGY ELLIPTA ) 100-62.5-25 MCG/ACT AEPB   albuterol  (VENTOLIN  HFA) 108 (90 Base) MCG/ACT inhaler   Other Relevant Orders   CT CHEST WO CONTRAST     Genitourinary   Acute cystitis   Acute for 4 to 5 weeks with multiple positive findings on UA.  Will send for culture to further assess. She has multiple abx allergies, so will start Macrobid  100 MG BID for 5 days. Change regimen if needed based on culture results. Discussed plan with her today. Recommend increase water intake at home and take cranberry supplement.      Relevant Orders   Urine Culture   Urinalysis, Routine w reflex microscopic (Completed)   Microscopic Examination (Completed)     Other   Unexplained weight loss   Continues on Megace , but does not take consistently. Recommend she take this as ordered. Check labs today. Would benefit CT lung to further assess, which discussed with her today. She is agreeable to this.  Order placed.      Relevant Orders   CT CHEST WO CONTRAST   Elevated TSH   Recheck labs today, is having some symptoms.  Start medication as needed.      Relevant Orders   T4, free   TSH   Depression, recurrent   Chronic, stable. Denies SI/HI.  Continue Wellbutrin  and Zoloft  which are offering her benefit.  Refills as needed.      Relevant Medications   buPROPion  (WELLBUTRIN  XL) 150 MG 24 hr tablet   B12 deficiency   Noted on recent labs, she has not started supplement, will send this in. She would rather take oral supplement as would be difficult to get to office every month for injections. Script sent.      Relevant Orders    Vitamin B12   Other Visit Diagnoses       Transportation insecurity       Referral to [facility] to work on transportation assistance.   Relevant Orders   AMB Referral [facility] Care Management     Colon cancer screening       Colon cancer screening - Cologuard.   Relevant Orders   Cologuard       I personally spent a total of 25 minutes in the care of the patient today including preparing to see the patient, getting/reviewing separately obtained history, performing a medically appropriate exam/evaluation, counseling and educating, placing orders, communicating results, and coordinating care.   Follow up plan: Return in about 4 weeks (around 06/18/2024) for COPD and Weight check.      "

## 2024-05-22 ENCOUNTER — Ambulatory Visit: Payer: Self-pay | Admitting: Nurse Practitioner

## 2024-05-22 LAB — VITAMIN B12: Vitamin B-12: 182 pg/mL — ABNORMAL LOW (ref 232–1245)

## 2024-05-22 LAB — T4, FREE: Free T4: 1.41 ng/dL (ref 0.82–1.77)

## 2024-05-22 LAB — TSH: TSH: 3.61 u[IU]/mL (ref 0.450–4.500)

## 2024-05-22 NOTE — Progress Notes (Signed)
 Good morning, please let Caitlyn May know her labs have returned and B12 level remains on low side.  Please take supplement I sent in on a daily basis. Thyroid  levels are normal this check. Any questions? Keep being amazing!!  Thank you for allowing me to participate in your care.  I appreciate you. Kindest regards, Jamilla Galli

## 2024-05-24 NOTE — Telephone Encounter (Signed)
 CRM # [id number] Owner: None Status: Unresolved Open  Priority: Routine Created on: 05/24/2024 10:44 AM By: Lorella Richerd HERO   Primary Information  Source  Lenon Line (Patient)   Subject  Lenon Line (Patient)   Topic  Clinical - Lab/Test Results    Communication  Reason for CRM: I gave patient lab results. Patient understood

## 2024-05-26 LAB — URINE CULTURE

## 2024-05-31 ENCOUNTER — Telehealth: Payer: Self-pay

## 2024-05-31 NOTE — Progress Notes (Signed)
 Complex Care Management Note Care Guide Note  05/31/2024 Name: Caitlyn May MRN: [id number] DOB: 19-Oct-1955   Complex Care Management Outreach Attempts: An unsuccessful telephone outreach was attempted today to offer the patient information about available complex care management services.  Follow Up Plan:  Additional outreach attempts will be made to offer the patient complex care management information and services.   Encounter Outcome:  No Answer  Jeoffrey Buffalo , RMA     [place]  [facility], [facility] Guide  Direct Dial: [phone]  Website: [email or web address]

## 2024-05-31 NOTE — Progress Notes (Signed)
 Complex Care Management Note  Care Guide Note 05/31/2024 Name: Caitlyn May MRN: [id number] DOB: 1955/08/21  Caitlyn May is a 69 y.o. year old female who sees Cannady, Melanie T, NP for primary care. I reached out to Rojelio Capers by phone today to offer complex care management services.  [name] was given information about Complex Care Management services today including:   The Complex Care Management services include support from the care team which includes your Nurse Care Manager, Clinical Social Worker, or Pharmacist.  The Complex Care Management team is here to help remove barriers to the health concerns and goals most important to you. Complex Care Management services are voluntary, and the patient may decline or stop services at any time by request to their care team member.   Complex Care Management Consent Status: Patient agreed to services and verbal consent obtained.   Follow up plan:  Telephone appointment with complex care management team member scheduled for:  06/05/2024  Encounter Outcome:  Patient Scheduled  Jeoffrey Buffalo , RMA     [place]  [facility], [facility] Guide  Direct Dial: [phone]  Website: [email or web address]

## 2024-06-03 ENCOUNTER — Telehealth: Payer: Self-pay

## 2024-06-03 NOTE — Progress Notes (Signed)
" ° °  Telephone encounter was:  Unsuccessful.  06/03/2024 Name: Caitlyn May MRN: [id number] DOB: [birth year]  Unsuccessful outbound call made today to assist with:  Transportation Needs   Outreach Attempt:  1st Attempt  No answer and unable to leave a message    [name] [facility]  [facility] Guide, Phone: [phone] Fax: [phone] Website: [email or web address]    "

## 2024-06-05 ENCOUNTER — Other Ambulatory Visit: Payer: Self-pay

## 2024-06-05 ENCOUNTER — Encounter: Payer: Self-pay | Admitting: Nurse Practitioner

## 2024-06-05 DIAGNOSIS — I10 Essential (primary) hypertension: Secondary | ICD-10-CM

## 2024-06-05 NOTE — Patient Instructions (Signed)
 Visit Information  Thank you for taking time to visit with me today. Please don't hesitate to contact me if I can be of assistance to you before our next scheduled appointment.  Our next appointment is by telephone on 06/19/24 at 9:30pm Please call the care guide team at [phone] if you need to cancel or reschedule your appointment.   Following is a copy of your care plan:   Goals Addressed             This Visit's Progress    [facility] RN Care Plan       Problems:  Care Coordination needs related to Financial Strain  and Transportation Knowledge Deficits related to HTN, Vitamin D  deficiency Lacks caregiver support.  Goal: Over the next 90 days the Patient will attend all scheduled medical appointments: PCP as evidenced by medical record        continue to work with RN Care Manager and/or Social Worker to address care management and care coordination needs related to htn, vitamin D  deficiency as evidenced by adherence to care management team scheduled appointments     take all medications exactly as prescribed and will call provider for medication related questions as evidenced by adherence    verbalize basic understanding of HTN and Vitamin D  deficiency disease process and self health management plan as evidenced by adherence  Interventions:   Hypertension Interventions: Last practice recorded BP readings:  BP Readings from Last 3 Encounters:  05/21/24 123/77  02/14/24 108/69  05/16/23 115/78   Most recent eGFR/CrCl:  Lab Results  Component Value Date   EGFR 45 (L) 02/14/2024    No components found for: CRCL  Evaluation of current treatment plan related to hypertension self management and patient's adherence to plan as established by provider Provided education to patient re: stroke prevention, s/s of heart attack and stroke Reviewed medications with patient and discussed importance of compliance Discussed plans with patient for ongoing care management follow up and  provided patient with direct contact information for care management team Reviewed scheduled/upcoming provider appointments including:  Provided education on prescribed diet recommended the BRAT diet for diarrhea Discussed complications of poorly controlled blood pressure such as heart disease, stroke, circulatory complications, vision complications, kidney impairment, sexual dysfunction Discussed complications of low Vitamin D  and adherence to medication   Advised patient to start the BRAT diet until diarrhea improves Provided education to patient re: flu, covid and signs and symptoms of dehydration Reviewed medications with patient and discussed adherence Collaborated with BSW regarding transportation needs Discussed plans with patient for ongoing care management follow up and provided patient with direct contact information for care management team Reviewed scheduled/upcoming provider appointments including: Social Work referral for Transoportation needs  Patient Self-Care Activities:  Attend all scheduled provider appointments Call pharmacy for medication refills 3-7 days in advance of running out of medications Call provider office for new concerns or questions  Perform all self care activities independently  Take medications as prescribed   learn about high blood pressure call doctor for signs and symptoms of high blood pressure keep all doctor appointments take medications for blood pressure exactly as prescribed begin an exercise program report new symptoms to your doctor  Plan:  Follow up with provider re: 1/23 Next PCP appointment scheduled for:  Telephone follow up appointment with care management team member scheduled for:  1/21 The care management team will reach out to the patient again over the next 14 days.  Please call the Suicide and Crisis Lifeline: 988 call the [place]  National Suicide Prevention Lifeline: (225)086-0928 or TTY: (819) 520-8018 TTY  445-246-8086) to talk to a trained counselor call 1-800-273-TALK (toll free, 24 hour hotline) if you are experiencing a Mental Health or Behavioral Health Crisis or need someone to talk to.  Patient verbalized understanding of Care plan and visit instructions communicated this visit  Angelise Petrich RN RN Care Manager [facility] [phone]

## 2024-06-05 NOTE — Patient Outreach (Signed)
 Complex Care Management   Visit Note  06/05/2024  Name:  Caitlyn May MRN: [id number] DOB: 10/09/1955  Situation: Referral received for Complex Care Management related to SDOH Barriers:  Transportation and HTN I obtained verbal consent from Patient.  Visit completed with Patient  on the phone  Background:   Past Medical History:  Diagnosis Date   Acute rhinosinusitis 07/23/2019   Chronic kidney disease    Depression    Failed back syndrome, lumbar    Hyperlipidemia    Hypertension    Osteoporosis    Weight loss     Assessment: Patient Reported Symptoms:  Cognitive Cognitive Status: No symptoms reported   Health Maintenance Behaviors: None Healing Pattern: Average  Neurological Neurological Review of Symptoms: Headaches Neurological Management Strategies: Routine screening Neurological Self-Management Outcome: 4 (good)  HEENT HEENT Symptoms Reported: No symptoms reported HEENT Management Strategies: Routine screening HEENT Self-Management Outcome: 4 (good)    Cardiovascular      Respiratory      Endocrine Endocrine Symptoms Reported: Shortness of breath Is patient diabetic?: No Endocrine Self-Management Outcome: 4 (good)  Gastrointestinal Gastrointestinal Symptoms Reported: Diarrhea Additional Gastrointestinal Details: potential flu Gastrointestinal Management Strategies: Adequate rest Gastrointestinal Self-Management Outcome: 3 (uncertain) Gastrointestinal Comment: pateient feels she may have the flu    Genitourinary Genitourinary Symptoms Reported: No symptoms reported Additional Genitourinary Details: has completed antibiotics for UTI Genitourinary Self-Management Outcome: 4 (good)  Integumentary Integumentary Symptoms Reported: No symptoms reported Skin Self-Management Outcome: 4 (good)  Musculoskeletal Musculoskelatal Symptoms Reviewed: Muscle pain Musculoskeletal Management Strategies: Coping strategies, Adequate rest Musculoskeletal Self-Management  Outcome: 3 (uncertain) Musculoskeletal Comment: body aches she feels may be the flu Falls in the past year?: No Fall risk Follow up: Falls evaluation completed, Education provided, Falls prevention discussed  Psychosocial            06/05/2024    PHQ2-9 Depression Screening   Little interest or pleasure in doing things    Feeling down, depressed, or hopeless    PHQ-2 - Total Score    Trouble falling or staying asleep, or sleeping too much    Feeling tired or having little energy    Poor appetite or overeating     Feeling bad about yourself - or that you are a failure or have let yourself or your family down    Trouble concentrating on things, such as reading the newspaper or watching television    Moving or speaking so slowly that other people could have noticed.  Or the opposite - being so fidgety or restless that you have been moving around a lot more than usual    Thoughts that you would be better off dead, or hurting yourself in some way    PHQ2-9 Total Score    If you checked off any problems, how difficult have these problems made it for you to do your work, take care of things at home, or get along with other people    Depression Interventions/Treatment      There were no vitals filed for this visit.    Medications Reviewed Today     Reviewed by Nivia, Alaijah Gibler , RN (Registered Nurse) on 06/05/24 at 1425  Med List Status: <None>   Medication Order Taking? Sig Documenting Provider Last Dose Status Informant  albuterol  (PROVENTIL ) (2.5 MG/3ML) 0.083% nebulizer solution [id number] Yes Take 3 mLs (2.5 mg total) by nebulization every 4 (four) hours as needed for wheezing or shortness of breath. Cannady, Jolene T, NP  Active   albuterol  (VENTOLIN  HFA)  108 (90 Base) MCG/ACT inhaler [id number] Yes Inhale 2 puffs into the lungs every 6 (six) hours as needed for wheezing or shortness of breath. Cannady, Jolene T, NP  Active   amLODipine  (NORVASC ) 5 MG tablet [id number] Yes Take 1 tablet  (5 mg total) by mouth daily. Herold Hadassah SQUIBB, MD  Active   buPROPion  (WELLBUTRIN  XL) 150 MG 24 hr tablet [id number] Yes Take 1 tablet (150 mg total) by mouth daily. Cannady, Jolene T, NP  Active   carvedilol  (COREG ) 12.5 MG tablet [id number] Yes Take 1 tablet (12.5 mg total) by mouth 2 (two) times daily. Herold Hadassah SQUIBB, MD  Active   cyanocobalamin (VITAMIN B12) 1000 MCG tablet [id number] Yes Take 1 tablet (1,000 mcg total) by mouth daily. Cannady, Jolene T, NP  Active   fluticasone  (FLONASE ) 50 MCG/ACT nasal spray [id number] Yes Place 2 sprays into both nostrils daily. Cannady, Jolene T, NP  Active   Fluticasone -Umeclidin-Vilant (TRELEGY ELLIPTA ) 100-62.5-25 MCG/ACT AEPB [id number] Yes Inhale 1 puff into the lungs daily. Cannady, Jolene T, NP  Active   gabapentin  (NEURONTIN ) 600 MG tablet [id number] Yes Take 1 tablet (600 mg total) by mouth 3 (three) times daily. Cannady, Jolene T, NP  Active   hydrOXYzine  (VISTARIL ) 25 MG capsule [id number] Yes Take 1 capsule (25 mg total) by mouth every 8 (eight) hours as needed. Herold Hadassah SQUIBB, MD  Active   megestrol  (MEGACE ) 40 MG tablet [id number] Yes Take 1 tablet (40 mg total) by mouth 4 (four) times daily. Cannady, Jolene T, NP  Active   olmesartan  (BENICAR ) 40 MG tablet [id number] Yes Take 1 tablet (40 mg total) by mouth daily. Herold Hadassah SQUIBB, MD  Active   pantoprazole  (PROTONIX ) 40 MG tablet [id number] Yes TAKE 1 TABLET BY MOUTH AT BEDTIME Cannady, Jolene T, NP  Active   rosuvastatin  (CRESTOR ) 40 MG tablet [id number] Yes Take 1 tablet (40 mg total) by mouth daily. Cannady, Jolene T, NP  Active   sertraline  (ZOLOFT ) 100 MG tablet [id number] Yes Take 1 tablet (100 mg total) by mouth daily. Cannady, Jolene T, NP  Active   tiZANidine  (ZANAFLEX ) 4 MG tablet [id number] Yes TAKE 1 TABLET(4 MG) BY MOUTH EVERY 6 HOURS AS NEEDED FOR MUSCLE SPASMS Herold Hadassah SQUIBB, MD  Active   traZODone  (DESYREL ) 100 MG tablet [id number] Yes Take 1.5 tablets (150 mg total) by mouth at bedtime as  needed for sleep. Herold Hadassah SQUIBB, MD  Active   Vitamin D , Ergocalciferol , (DRISDOL ) 1.25 MG (50000 UNIT) CAPS capsule [id number]  Take 1 capsule (50,000 Units total) by mouth every 7 (seven) days.  Patient not taking: Reported on 06/05/2024   Cannady, Jolene T, NP  Active             Recommendation:   Continue Current Plan of Care  Follow Up Plan:   Telephone follow-up 2 weeks  Amaru Burroughs RN RN Care Manager [facility] [phone]

## 2024-06-06 ENCOUNTER — Telehealth: Payer: Self-pay

## 2024-06-06 NOTE — Progress Notes (Signed)
" ° °  Telephone encounter was:  Unsuccessful.  06/06/2024 Name: Caitlyn May MRN: [id number] DOB: 10-Nov-1955  Unsuccessful outbound call made today to assist with:  Transportation Needs   Outreach Attempt:  2nd Attempt  No answer and unable to leave a message   [name] [facility]  [facility] Guide, Phone: [phone] Fax: [phone] Website: [email or web address]    "

## 2024-06-07 ENCOUNTER — Telehealth: Payer: Self-pay

## 2024-06-07 NOTE — Patient Instructions (Signed)
 Rojelio Capers - I am sorry I was unable to reach you today for our scheduled appointment. I work with Valerio Melanie DASEN, NP and am calling to support your healthcare needs. Please contact me at [phone] at your earliest convenience. I look forward to speaking with you soon.   Thank you,  Thersia Hoar, BSW, MHA [place]  [facility] Social Worker, Population Health [phone]

## 2024-06-10 ENCOUNTER — Telehealth: Payer: Self-pay

## 2024-06-10 NOTE — Progress Notes (Signed)
" ° °  Telephone encounter was:  Unsuccessful.  06/10/2024 Name: [name] MRN: [id number] DOB: [birth year]  Unsuccessful outbound call made today to assist with:  Transportation Needs   Outreach Attempt:  3rd Attempt.  Referral closed unable to contact patient.  No answer and unable to leave a message    [name] [facility] Guide, Phone: [phone] Fax: [phone] Website: [email or web address]    "

## 2024-06-19 ENCOUNTER — Other Ambulatory Visit: Payer: Self-pay

## 2024-06-19 NOTE — Patient Instructions (Signed)
 Caitlyn May - I am sorry I was unable to reach you today for our scheduled appointment. I work with Valerio Melanie DASEN, NP and am calling to support your healthcare needs. Please contact me at [phone] at your earliest convenience. I look forward to speaking with you soon.   Thank you,  Edgerrin Correia  Administrator, [employer] [phone]

## 2024-06-21 ENCOUNTER — Ambulatory Visit: Admitting: Nurse Practitioner

## 2024-06-22 NOTE — Patient Instructions (Incomplete)

## 2024-06-25 ENCOUNTER — Other Ambulatory Visit: Payer: Self-pay

## 2024-06-25 ENCOUNTER — Ambulatory Visit: Admitting: Nurse Practitioner

## 2024-06-25 NOTE — Patient Instructions (Signed)
 Visit Information  Thank you for taking time to visit with me today. Please don't hesitate to contact me if I can be of assistance to you before our next scheduled appointment.  Your next care management appointment is by telephone on 07/09/24 at 10:00am  Telephone follow-up 14 days  Please call the care guide team at [phone] if you need to cancel, schedule, or reschedule an appointment.   Please call the Suicide and Crisis Lifeline: 988 call the [place]  National Suicide Prevention Lifeline: 607-641-7318 or TTY: (347) 732-9280 TTY (430)684-3794) to talk to a trained counselor call 1-800-273-TALK (toll free, 24 hour hotline) if you are experiencing a Mental Health or Behavioral Health Crisis or need someone to talk to. Ronin Rehfeldt RN RN Care Manager [facility] [phone]

## 2024-06-25 NOTE — Patient Outreach (Signed)
 Complex Care Management   Visit Note  06/25/2024  Name:  Caitlyn May MRN: [id number] DOB: Apr 05, 1956  Situation: Referral received for Complex Care Management related to COPD I obtained verbal consent from Patient.  Visit completed with Patient  on the phone  Background:   Past Medical History:  Diagnosis Date   Acute rhinosinusitis 07/23/2019   Chronic kidney disease    Depression    Failed back syndrome, lumbar    Hyperlipidemia    Hypertension    Osteoporosis    Weight loss     Assessment: Patient Reported Symptoms:  Cognitive Cognitive Status: No symptoms reported   Health Maintenance Behaviors: None Healing Pattern: Average  Neurological Neurological Review of Symptoms: Headaches Neurological Management Strategies: Routine screening Neurological Self-Management Outcome: 4 (good)  HEENT HEENT Symptoms Reported: No symptoms reported HEENT Management Strategies: Routine screening HEENT Self-Management Outcome: 4 (good)    Cardiovascular Cardiovascular Symptoms Reported: Dizziness Does patient have uncontrolled Hypertension?: Yes Cardiovascular Management Strategies: Routine screening Cardiovascular Self-Management Outcome: 3 (uncertain)  Respiratory Respiratory Symptoms Reported: No symptoms reported Respiratory Self-Management Outcome: 4 (good)  Endocrine Endocrine Symptoms Reported: No symptoms reported Is patient diabetic?: No Endocrine Self-Management Outcome: 4 (good)  Gastrointestinal Gastrointestinal Symptoms Reported: No symptoms reported Gastrointestinal Management Strategies: Adequate rest Gastrointestinal Self-Management Outcome: 4 (good)    Genitourinary Genitourinary Symptoms Reported: Frequency Additional Genitourinary Details: suspects she may still have UTI Genitourinary Self-Management Outcome: 4 (good)  Integumentary Integumentary Symptoms Reported: No symptoms reported Skin Management Strategies: Routine screening Skin Self-Management  Outcome: 4 (good)  Musculoskeletal Musculoskelatal Symptoms Reviewed: No symptoms reported Musculoskeletal Management Strategies: Adequate rest, Routine screening Musculoskeletal Self-Management Outcome: 3 (uncertain) Falls in the past year?: No Number of falls in past year: 1 or less Was there an injury with Fall?: No Fall Risk Category Calculator: 0 Patient Fall Risk Level: Low Fall Risk Fall risk Follow up: Education provided, Falls prevention discussed  Psychosocial Psychosocial Symptoms Reported: No symptoms reported Behavioral Health Self-Management Outcome: 4 (good) Major Change/Loss/Stressor/Fears (CP): Denies      06/25/2024    PHQ2-9 Depression Screening   Little interest or pleasure in doing things    Feeling down, depressed, or hopeless    PHQ-2 - Total Score    Trouble falling or staying asleep, or sleeping too much    Feeling tired or having little energy    Poor appetite or overeating     Feeling bad about yourself - or that you are a failure or have let yourself or your family down    Trouble concentrating on things, such as reading the newspaper or watching television    Moving or speaking so slowly that other people could have noticed.  Or the opposite - being so fidgety or restless that you have been moving around a lot more than usual    Thoughts that you would be better off dead, or hurting yourself in some way    PHQ2-9 Total Score    If you checked off any problems, how difficult have these problems made it for you to do your work, take care of things at home, or get along with other people    Depression Interventions/Treatment      There were no vitals filed for this visit. Pain Scale: 0-10 Pain Type: Chronic pain Pain Location: Back Pain Orientation: Lower  Medications Reviewed Today     Reviewed by Lavene Penagos , RN (Registered Nurse) on 06/25/24 at 1007  Med List Status: <None>   Medication Order Taking? Sig  Documenting Provider Last Dose Status  Informant  albuterol  (PROVENTIL ) (2.5 MG/3ML) 0.083% nebulizer solution [id number] Yes Take 3 mLs (2.5 mg total) by nebulization every 4 (four) hours as needed for wheezing or shortness of breath. Cannady, Jolene T, NP  Active   albuterol  (VENTOLIN  HFA) 108 (90 Base) MCG/ACT inhaler [id number] Yes Inhale 2 puffs into the lungs every 6 (six) hours as needed for wheezing or shortness of breath. Cannady, Jolene T, NP  Active   amLODipine  (NORVASC ) 5 MG tablet [id number] Yes Take 1 tablet (5 mg total) by mouth daily. Herold Hadassah SQUIBB, MD  Active   buPROPion  (WELLBUTRIN  XL) 150 MG 24 hr tablet [id number] Yes Take 1 tablet (150 mg total) by mouth daily. Cannady, Jolene T, NP  Active   carvedilol  (COREG ) 12.5 MG tablet [id number] Yes Take 1 tablet (12.5 mg total) by mouth 2 (two) times daily. Herold Hadassah SQUIBB, MD  Active   cyanocobalamin (VITAMIN B12) 1000 MCG tablet [id number] Yes Take 1 tablet (1,000 mcg total) by mouth daily. Cannady, Jolene T, NP  Active   fluticasone  (FLONASE ) 50 MCG/ACT nasal spray [id number] Yes Place 2 sprays into both nostrils daily. Cannady, Jolene T, NP  Active   Fluticasone -Umeclidin-Vilant (TRELEGY ELLIPTA ) 100-62.5-25 MCG/ACT AEPB [id number] Yes Inhale 1 puff into the lungs daily. Cannady, Jolene T, NP  Active   gabapentin  (NEURONTIN ) 600 MG tablet [id number] Yes Take 1 tablet (600 mg total) by mouth 3 (three) times daily. Cannady, Jolene T, NP  Active   hydrOXYzine  (VISTARIL ) 25 MG capsule [id number] Yes Take 1 capsule (25 mg total) by mouth every 8 (eight) hours as needed. Herold Hadassah SQUIBB, MD  Active   megestrol  (MEGACE ) 40 MG tablet [id number] Yes Take 1 tablet (40 mg total) by mouth 4 (four) times daily. Cannady, Jolene T, NP  Active   olmesartan  (BENICAR ) 40 MG tablet [id number] Yes Take 1 tablet (40 mg total) by mouth daily. Herold Hadassah SQUIBB, MD  Active   pantoprazole  (PROTONIX ) 40 MG tablet [id number] Yes TAKE 1 TABLET BY MOUTH AT BEDTIME Cannady, Jolene T, NP  Active   rosuvastatin   (CRESTOR ) 40 MG tablet [id number] Yes Take 1 tablet (40 mg total) by mouth daily. Cannady, Jolene T, NP  Active   sertraline  (ZOLOFT ) 100 MG tablet [id number] Yes Take 1 tablet (100 mg total) by mouth daily. Cannady, Jolene T, NP  Active   tiZANidine  (ZANAFLEX ) 4 MG tablet [id number] Yes TAKE 1 TABLET(4 MG) BY MOUTH EVERY 6 HOURS AS NEEDED FOR MUSCLE SPASMS Herold Hadassah SQUIBB, MD  Active   traZODone  (DESYREL ) 100 MG tablet [id number] Yes Take 1.5 tablets (150 mg total) by mouth at bedtime as needed for sleep. Herold Hadassah SQUIBB, MD  Active   Vitamin D , Ergocalciferol , (DRISDOL ) 1.25 MG (50000 UNIT) CAPS capsule [id number] Yes Take 1 capsule (50,000 Units total) by mouth every 7 (seven) days. Cannady, Jolene T, NP  Active             Recommendation:   Continue Current Plan of Care  Follow Up Plan:   Telephone follow-up 07/09/24  Kiasha Bellin RN RN Care Manager [facility] Population Health [phone]

## 2024-06-28 ENCOUNTER — Telehealth: Payer: Self-pay

## 2024-06-28 NOTE — Progress Notes (Unsigned)
 Complex Care Management Care Guide Note  06/28/2024 Name: Caitlyn May MRN: [id number] DOB: September 08, 1955  Caitlyn May is a 69 y.o. year old female who is a primary care patient of Cannady, Jolene T, NP and is actively engaged with the care management team. I reached out to [facility] by phone today to assist with re-scheduling  with the BSW.  Follow up plan: Unsuccessful telephone outreach attempt made. A HIPAA compliant phone message was left for the patient providing contact information and requesting a return call.  Jeoffrey Buffalo , RMA     [facility]  [facility], [facility] Guide  Direct Dial: [phone]  Website: [email or web address]

## 2024-07-04 NOTE — Progress Notes (Signed)
 Complex Care Management Care Guide Note  07/04/2024 Name: Caitlyn May MRN: [id number] DOB: 1955/10/18  Caitlyn May is a 69 y.o. year old female who is a primary care patient of Cannady, Jolene T, NP and is actively engaged with the care management team. I reached out to [facility] by phone today to assist with re-scheduling  with the BSW.  Follow up plan: Unsuccessful telephone outreach attempt made. A HIPAA compliant phone message was left for the patient providing contact information and requesting a return call.  Jeoffrey Buffalo , RMA     [facility]  [facility], [facility] Guide  Direct Dial: [phone]  Website: [email or web address]

## 2024-07-04 NOTE — Progress Notes (Signed)
 Complex Care Management Care Guide Note  07/04/2024 Name: Caitlyn May MRN: [id number] DOB: 1955/10/18  [name] is a 69 y.o. year old female who is a primary care patient of Cannady, Jolene T, NP and is actively engaged with the care management team. I reached out to [facility] by phone today to assist with re-scheduling  with the BSW.  Follow up plan: Unsuccessful telephone outreach attempt made. A HIPAA compliant phone message was left for the patient providing contact information and requesting a return call.  Jeoffrey Buffalo , RMA     [facility]  [facility], [facility] Guide  Direct Dial: [phone]  Website: [email or web address]

## 2024-07-09 ENCOUNTER — Telehealth

## 2024-08-06 ENCOUNTER — Ambulatory Visit

## 2024-08-15 ENCOUNTER — Ambulatory Visit

## 2024-08-21 ENCOUNTER — Ambulatory Visit: Admitting: Nurse Practitioner
